# Patient Record
Sex: Male | Born: 1940 | Race: White | Hispanic: No | Marital: Married | Smoking: Former smoker
Health system: Southern US, Community
[De-identification: ages and names within clinical notes are randomized; demographics above are authoritative.]

## PROBLEM LIST (undated history)

## (undated) DIAGNOSIS — E119 Type 2 diabetes mellitus without complications: Secondary | ICD-10-CM

## (undated) DIAGNOSIS — I10 Essential (primary) hypertension: Secondary | ICD-10-CM

## (undated) DIAGNOSIS — M109 Gout, unspecified: Secondary | ICD-10-CM

## (undated) DIAGNOSIS — M0230 Reiter's disease, unspecified site: Secondary | ICD-10-CM

## (undated) DIAGNOSIS — I2510 Atherosclerotic heart disease of native coronary artery without angina pectoris: Secondary | ICD-10-CM

## (undated) DIAGNOSIS — D126 Benign neoplasm of colon, unspecified: Secondary | ICD-10-CM

## (undated) DIAGNOSIS — N183 Chronic kidney disease, stage 3 unspecified: Secondary | ICD-10-CM

## (undated) DIAGNOSIS — E785 Hyperlipidemia, unspecified: Secondary | ICD-10-CM

## (undated) DIAGNOSIS — R972 Elevated prostate specific antigen [PSA]: Secondary | ICD-10-CM

## (undated) DIAGNOSIS — K219 Gastro-esophageal reflux disease without esophagitis: Secondary | ICD-10-CM

## (undated) DIAGNOSIS — Z86010 Personal history of colonic polyps: Secondary | ICD-10-CM

## (undated) DIAGNOSIS — E559 Vitamin D deficiency, unspecified: Secondary | ICD-10-CM

## (undated) DIAGNOSIS — C61 Malignant neoplasm of prostate: Secondary | ICD-10-CM

## (undated) DIAGNOSIS — G459 Transient cerebral ischemic attack, unspecified: Secondary | ICD-10-CM

## (undated) DIAGNOSIS — N210 Calculus in bladder: Secondary | ICD-10-CM

## (undated) DIAGNOSIS — N529 Male erectile dysfunction, unspecified: Secondary | ICD-10-CM

## (undated) DIAGNOSIS — Z87442 Personal history of urinary calculi: Secondary | ICD-10-CM

## (undated) DIAGNOSIS — R0600 Dyspnea, unspecified: Secondary | ICD-10-CM

## (undated) DIAGNOSIS — N200 Calculus of kidney: Secondary | ICD-10-CM

## (undated) HISTORY — DX: Gastro-esophageal reflux disease without esophagitis: K21.9

## (undated) HISTORY — DX: Essential (primary) hypertension: I10

## (undated) HISTORY — DX: Malignant neoplasm of prostate: C61

## (undated) HISTORY — PX: ELBOW SURGERY: SHX618

## (undated) HISTORY — DX: Gout, unspecified: M10.9

## (undated) HISTORY — PX: VASECTOMY: SHX75

## (undated) HISTORY — DX: Hyperlipidemia, unspecified: E78.5

## (undated) HISTORY — PX: COLONOSCOPY: SHX174

## (undated) HISTORY — PX: OTHER SURGICAL HISTORY: SHX169

## (undated) HISTORY — PX: CATARACT EXTRACTION: SUR2

## (undated) HISTORY — PX: PROSTATECTOMY: SHX69

## (undated) HISTORY — DX: Reiter's disease, unspecified site: M02.30

## (undated) HISTORY — DX: Calculus of kidney: N20.0

## (undated) HISTORY — DX: Transient cerebral ischemic attack, unspecified: G45.9

## (undated) HISTORY — DX: Personal history of colonic polyps: Z86.010

## (undated) HISTORY — DX: Benign neoplasm of colon, unspecified: D12.6

## (undated) HISTORY — DX: Type 2 diabetes mellitus without complications: E11.9

## (undated) HISTORY — DX: Vitamin D deficiency, unspecified: E55.9

## (undated) HISTORY — DX: Male erectile dysfunction, unspecified: N52.9

## (undated) HISTORY — PX: PROSTATE BIOPSY: SHX241

---

## 2000-04-21 ENCOUNTER — Emergency Department (HOSPITAL_COMMUNITY): Admission: EM | Admit: 2000-04-21 | Discharge: 2000-04-21 | Payer: Self-pay | Admitting: Emergency Medicine

## 2000-04-21 ENCOUNTER — Encounter: Payer: Self-pay | Admitting: Emergency Medicine

## 2004-01-13 ENCOUNTER — Ambulatory Visit: Payer: Self-pay | Admitting: Internal Medicine

## 2004-04-12 ENCOUNTER — Ambulatory Visit: Payer: Self-pay | Admitting: Internal Medicine

## 2004-04-16 ENCOUNTER — Ambulatory Visit: Payer: Self-pay | Admitting: Internal Medicine

## 2004-07-12 ENCOUNTER — Ambulatory Visit: Payer: Self-pay | Admitting: Internal Medicine

## 2004-11-02 ENCOUNTER — Ambulatory Visit: Payer: Self-pay | Admitting: Internal Medicine

## 2005-01-24 DIAGNOSIS — C61 Malignant neoplasm of prostate: Secondary | ICD-10-CM

## 2005-01-24 HISTORY — PX: PROSTATECTOMY: SHX69

## 2005-01-24 HISTORY — DX: Malignant neoplasm of prostate: C61

## 2005-05-10 ENCOUNTER — Ambulatory Visit: Payer: Self-pay | Admitting: Internal Medicine

## 2005-06-27 ENCOUNTER — Ambulatory Visit: Payer: Self-pay | Admitting: Internal Medicine

## 2005-07-12 ENCOUNTER — Ambulatory Visit: Payer: Self-pay | Admitting: Internal Medicine

## 2005-07-12 ENCOUNTER — Inpatient Hospital Stay (HOSPITAL_COMMUNITY): Admission: EM | Admit: 2005-07-12 | Discharge: 2005-07-12 | Payer: Self-pay | Admitting: Emergency Medicine

## 2005-08-18 ENCOUNTER — Ambulatory Visit: Payer: Self-pay | Admitting: Internal Medicine

## 2005-08-22 ENCOUNTER — Inpatient Hospital Stay (HOSPITAL_COMMUNITY): Admission: RE | Admit: 2005-08-22 | Discharge: 2005-08-23 | Payer: Self-pay | Admitting: Urology

## 2005-08-22 ENCOUNTER — Encounter (INDEPENDENT_AMBULATORY_CARE_PROVIDER_SITE_OTHER): Payer: Self-pay | Admitting: *Deleted

## 2005-08-31 ENCOUNTER — Emergency Department (HOSPITAL_COMMUNITY): Admission: EM | Admit: 2005-08-31 | Discharge: 2005-08-31 | Payer: Self-pay | Admitting: Emergency Medicine

## 2005-09-19 ENCOUNTER — Ambulatory Visit: Payer: Self-pay | Admitting: Internal Medicine

## 2005-09-24 ENCOUNTER — Emergency Department (HOSPITAL_COMMUNITY): Admission: EM | Admit: 2005-09-24 | Discharge: 2005-09-24 | Payer: Self-pay | Admitting: Emergency Medicine

## 2005-12-05 ENCOUNTER — Ambulatory Visit (HOSPITAL_BASED_OUTPATIENT_CLINIC_OR_DEPARTMENT_OTHER): Admission: RE | Admit: 2005-12-05 | Discharge: 2005-12-05 | Payer: Self-pay | Admitting: Urology

## 2005-12-26 ENCOUNTER — Ambulatory Visit: Payer: Self-pay | Admitting: Internal Medicine

## 2005-12-26 LAB — CONVERTED CEMR LAB: Hgb A1c MFr Bld: 8.2 % — ABNORMAL HIGH (ref 4.6–6.0)

## 2006-04-18 ENCOUNTER — Ambulatory Visit: Payer: Self-pay | Admitting: Internal Medicine

## 2006-04-18 LAB — CONVERTED CEMR LAB: Hgb A1c MFr Bld: 6.2 % — ABNORMAL HIGH (ref 4.6–6.0)

## 2006-08-22 ENCOUNTER — Encounter: Payer: Self-pay | Admitting: Internal Medicine

## 2007-01-12 ENCOUNTER — Telehealth: Payer: Self-pay | Admitting: Internal Medicine

## 2007-01-16 ENCOUNTER — Encounter: Payer: Self-pay | Admitting: Internal Medicine

## 2007-02-20 ENCOUNTER — Encounter: Payer: Self-pay | Admitting: Internal Medicine

## 2007-07-19 ENCOUNTER — Ambulatory Visit: Payer: Self-pay | Admitting: Internal Medicine

## 2007-07-19 DIAGNOSIS — E119 Type 2 diabetes mellitus without complications: Secondary | ICD-10-CM

## 2007-07-19 DIAGNOSIS — M109 Gout, unspecified: Secondary | ICD-10-CM

## 2007-07-19 DIAGNOSIS — I10 Essential (primary) hypertension: Secondary | ICD-10-CM

## 2007-07-19 DIAGNOSIS — E1122 Type 2 diabetes mellitus with diabetic chronic kidney disease: Secondary | ICD-10-CM | POA: Insufficient documentation

## 2007-07-19 DIAGNOSIS — N183 Chronic kidney disease, stage 3 (moderate): Secondary | ICD-10-CM

## 2007-07-19 HISTORY — DX: Essential (primary) hypertension: I10

## 2007-07-19 HISTORY — DX: Gout, unspecified: M10.9

## 2007-07-19 HISTORY — DX: Type 2 diabetes mellitus without complications: E11.9

## 2007-07-19 LAB — CONVERTED CEMR LAB: Hgb A1c MFr Bld: 6.7 % — ABNORMAL HIGH (ref 4.6–6.0)

## 2007-07-23 ENCOUNTER — Encounter: Payer: Self-pay | Admitting: Internal Medicine

## 2007-08-21 ENCOUNTER — Encounter: Payer: Self-pay | Admitting: Internal Medicine

## 2007-11-20 ENCOUNTER — Encounter: Payer: Self-pay | Admitting: Internal Medicine

## 2007-11-21 ENCOUNTER — Ambulatory Visit: Payer: Self-pay | Admitting: Internal Medicine

## 2007-11-21 LAB — CONVERTED CEMR LAB
ALT: 44 units/L (ref 0–53)
AST: 32 units/L (ref 0–37)
Albumin: 4.1 g/dL (ref 3.5–5.2)
Alkaline Phosphatase: 72 units/L (ref 39–117)
BUN: 21 mg/dL (ref 6–23)
Basophils Absolute: 0 10*3/uL (ref 0.0–0.1)
Basophils Relative: 0.3 % (ref 0.0–3.0)
Bilirubin, Direct: 0.1 mg/dL (ref 0.0–0.3)
CO2: 32 meq/L (ref 19–32)
Calcium: 9.5 mg/dL (ref 8.4–10.5)
Chloride: 104 meq/L (ref 96–112)
Cholesterol: 192 mg/dL (ref 0–200)
Creatinine, Ser: 1.5 mg/dL (ref 0.4–1.5)
Direct LDL: 88.3 mg/dL
Eosinophils Absolute: 0.4 10*3/uL (ref 0.0–0.7)
Eosinophils Relative: 4.8 % (ref 0.0–5.0)
GFR calc Af Amer: 60 mL/min
GFR calc non Af Amer: 50 mL/min
Glucose, Bld: 117 mg/dL — ABNORMAL HIGH (ref 70–99)
HCT: 38.7 % — ABNORMAL LOW (ref 39.0–52.0)
HDL: 24.8 mg/dL — ABNORMAL LOW (ref >39.0)
Hemoglobin: 13.7 g/dL (ref 13.0–17.0)
Hgb A1c MFr Bld: 6.9 % — ABNORMAL HIGH (ref 4.6–6.0)
Lymphocytes Relative: 20.4 % (ref 12.0–46.0)
MCHC: 35.4 g/dL (ref 30.0–36.0)
MCV: 98.5 fL (ref 78.0–100.0)
Monocytes Absolute: 0.3 10*3/uL (ref 0.1–1.0)
Monocytes Relative: 4.6 % (ref 3.0–12.0)
Neutro Abs: 5.2 10*3/uL (ref 1.4–7.7)
Neutrophils Relative %: 69.9 % (ref 43.0–77.0)
Platelets: 217 10*3/uL (ref 150–400)
Potassium: 4.6 meq/L (ref 3.5–5.1)
RBC: 3.93 M/uL — ABNORMAL LOW (ref 4.22–5.81)
RDW: 13.6 % (ref 11.5–14.6)
Sodium: 143 meq/L (ref 135–145)
TSH: 2.71 microintl units/mL (ref 0.35–5.50)
Total Bilirubin: 0.7 mg/dL (ref 0.3–1.2)
Total CHOL/HDL Ratio: 7.7
Total Protein: 7.4 g/dL (ref 6.0–8.3)
Triglycerides: 377 mg/dL (ref 0–149)
VLDL: 75 mg/dL — ABNORMAL HIGH (ref 0–40)
WBC: 7.4 10*3/uL (ref 4.5–10.5)

## 2007-11-28 ENCOUNTER — Ambulatory Visit: Payer: Self-pay | Admitting: Gastroenterology

## 2007-12-12 ENCOUNTER — Ambulatory Visit: Payer: Self-pay | Admitting: Gastroenterology

## 2007-12-12 ENCOUNTER — Encounter: Payer: Self-pay | Admitting: Internal Medicine

## 2007-12-12 ENCOUNTER — Encounter: Payer: Self-pay | Admitting: Gastroenterology

## 2007-12-14 ENCOUNTER — Encounter: Payer: Self-pay | Admitting: Gastroenterology

## 2008-03-21 ENCOUNTER — Ambulatory Visit: Payer: Self-pay | Admitting: Internal Medicine

## 2008-03-21 LAB — CONVERTED CEMR LAB: Hgb A1c MFr Bld: 7.1 % — ABNORMAL HIGH (ref 4.6–6.0)

## 2008-04-18 ENCOUNTER — Encounter: Payer: Self-pay | Admitting: Internal Medicine

## 2008-07-31 ENCOUNTER — Ambulatory Visit: Payer: Self-pay | Admitting: Internal Medicine

## 2008-07-31 LAB — CONVERTED CEMR LAB
Blood Glucose, Fingerstick: 148
Hgb A1c MFr Bld: 6.6 % — ABNORMAL HIGH (ref 4.6–6.5)

## 2008-08-20 ENCOUNTER — Encounter: Payer: Self-pay | Admitting: Internal Medicine

## 2008-11-25 ENCOUNTER — Ambulatory Visit: Payer: Self-pay | Admitting: Internal Medicine

## 2008-11-25 LAB — CONVERTED CEMR LAB
ALT: 47 units/L (ref 0–53)
Basophils Relative: 0.5 % (ref 0.0–3.0)
Chloride: 103 meq/L (ref 96–112)
Eosinophils Relative: 4.6 % (ref 0.0–5.0)
HCT: 38.9 % — ABNORMAL LOW (ref 39.0–52.0)
Hemoglobin: 13.6 g/dL (ref 13.0–17.0)
Lymphs Abs: 1.4 10*3/uL (ref 0.7–4.0)
MCV: 103 fL — ABNORMAL HIGH (ref 78.0–100.0)
Monocytes Absolute: 0.4 10*3/uL (ref 0.1–1.0)
PSA: 0.01 ng/mL — ABNORMAL LOW (ref 0.10–4.00)
Potassium: 4.7 meq/L (ref 3.5–5.1)
RBC: 3.78 M/uL — ABNORMAL LOW (ref 4.22–5.81)
Sodium: 144 meq/L (ref 135–145)
TSH: 3 microintl units/mL (ref 0.35–5.50)
Total CHOL/HDL Ratio: 7
Total Protein: 7.5 g/dL (ref 6.0–8.3)
VLDL: 62.2 mg/dL — ABNORMAL HIGH (ref 0.0–40.0)
WBC: 7.5 10*3/uL (ref 4.5–10.5)

## 2008-12-02 ENCOUNTER — Ambulatory Visit: Payer: Self-pay | Admitting: Internal Medicine

## 2008-12-02 ENCOUNTER — Encounter (INDEPENDENT_AMBULATORY_CARE_PROVIDER_SITE_OTHER): Payer: Self-pay | Admitting: *Deleted

## 2008-12-02 DIAGNOSIS — E785 Hyperlipidemia, unspecified: Secondary | ICD-10-CM

## 2008-12-02 DIAGNOSIS — Z860100 Personal history of colon polyps, unspecified: Secondary | ICD-10-CM

## 2008-12-02 HISTORY — DX: Personal history of colon polyps, unspecified: Z86.0100

## 2008-12-02 HISTORY — DX: Hyperlipidemia, unspecified: E78.5

## 2008-12-02 HISTORY — DX: Personal history of colonic polyps: Z86.010

## 2008-12-30 ENCOUNTER — Encounter (INDEPENDENT_AMBULATORY_CARE_PROVIDER_SITE_OTHER): Payer: Self-pay | Admitting: *Deleted

## 2009-02-10 ENCOUNTER — Encounter: Payer: Self-pay | Admitting: Internal Medicine

## 2009-03-03 ENCOUNTER — Ambulatory Visit: Payer: Self-pay | Admitting: Internal Medicine

## 2009-03-03 LAB — CONVERTED CEMR LAB: Hgb A1c MFr Bld: 7.4 % — ABNORMAL HIGH (ref 4.6–6.5)

## 2009-06-02 ENCOUNTER — Ambulatory Visit: Payer: Self-pay | Admitting: Internal Medicine

## 2009-06-02 LAB — CONVERTED CEMR LAB: Hgb A1c MFr Bld: 6.9 % — ABNORMAL HIGH (ref 4.6–6.5)

## 2009-08-12 ENCOUNTER — Encounter: Payer: Self-pay | Admitting: Internal Medicine

## 2009-08-28 ENCOUNTER — Ambulatory Visit: Payer: Self-pay | Admitting: Internal Medicine

## 2009-08-28 LAB — CONVERTED CEMR LAB
Blood Glucose, Fingerstick: 90
Hgb A1c MFr Bld: 7.2 % — ABNORMAL HIGH (ref 4.6–6.5)

## 2009-11-27 ENCOUNTER — Ambulatory Visit: Payer: Self-pay | Admitting: Internal Medicine

## 2009-11-27 LAB — CONVERTED CEMR LAB: Hgb A1c MFr Bld: 7 % — ABNORMAL HIGH (ref 4.6–6.5)

## 2009-11-30 ENCOUNTER — Telehealth: Payer: Self-pay | Admitting: Internal Medicine

## 2010-02-20 ENCOUNTER — Encounter: Payer: Self-pay | Admitting: Internal Medicine

## 2010-02-25 NOTE — Assessment & Plan Note (Signed)
Summary: 3 month rov/njr/WIFE RESCD PER PT//CCM    Vital Signs:  Patient profile:   70 year old male Weight:      217 pounds Temp:     97.2 degrees F oral BP sitting:   120 / 80  (right arm) Cuff size:   regular  Vitals Entered By: Raechel Ache, RN (March 03, 2009 3:26 PM) CC: ROV , no c/o Is Patient Diabetic? Yes CBG Result 288   CC:  ROV  and no c/o.  History of Present Illness: 70 year old patient seen today for follow-up of type 2 diabetes, hypertension, and dyslipidemia.  he feels well.  His last hemoglobin A1c7.0.  Her random blood sugar this afternoon  288 after a visit to [facility] fast food.  Has history of hypertension, which has been well-controlled.  He has dyslipidemia, controlled on Niaspan.  His history gout, which has been quiescent, and he also has a history of colonic polyps.  He denies any cardiopulmonary complaints  Allergies: 1)  ! Ace Inhibitors  Past History:  Past Medical History: Reviewed history from 12/02/2008 and no changes required. 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 status post nerve sparing R LRP for prostate cancer, July 2007 Vasectomy surgery for bone spur, right arm  flexible sigmoidoscopy 2003 colonoscopy November 2009  Review of Systems  The patient denies anorexia, fever, weight loss, weight gain, vision loss, decreased hearing, hoarseness, chest pain, syncope, dyspnea on exertion, peripheral edema, prolonged cough, headaches, hemoptysis, abdominal pain, melena, hematochezia, severe indigestion/heartburn, hematuria, incontinence, genital sores, muscle weakness, suspicious skin lesions, transient blindness, difficulty walking, depression, unusual weight change, abnormal bleeding, enlarged lymph nodes,  angioedema, breast masses, and testicular masses.    Physical Exam  General:  Well-developed,well-nourished,in no acute distress; alert,appropriate and cooperative throughout examination; 120/80 Head:  Normocephalic and atraumatic without obvious abnormalities. No apparent alopecia or balding. Eyes:  No corneal or conjunctival inflammation noted. EOMI. Perrla. Funduscopic exam benign, without hemorrhages, exudates or papilledema. Vision grossly normal. Ears:  External ear exam shows no significant lesions or deformities.  Otoscopic examination reveals clear canals, tympanic membranes are intact bilaterally without bulging, retraction, inflammation or discharge. Hearing is grossly normal bilaterally. Mouth:  Oral mucosa and oropharynx without lesions or exudates.  Teeth in good repair. Neck:  No deformities, masses, or tenderness noted. Lungs:  Normal respiratory effort, chest expands symmetrically. Lungs are clear to auscultation, no crackles or wheezes. Heart:  Normal rate and regular rhythm. S1 and S2 normal without gallop, murmur, click, rub or other extra sounds. Abdomen:  Bowel sounds positive,abdomen soft and non-tender without masses, organomegaly or hernias noted. Msk:  No deformity or scoliosis noted of thoracic or lumbar spine.   Pulses:  R and L carotid,radial,femoral,dorsalis pedis and posterior tibial pulses are full and equal bilaterally  Diabetes Management Exam:    Foot Exam (with socks and/or shoes not present):       Sensory-Pinprick/Light touch:          Left medial foot (L-4): normal          Left dorsal foot (L-5): normal          Left lateral foot (S-1): normal          Right medial foot (L-4): normal          Right dorsal foot (L-5): normal  Right lateral foot (S-1): normal       Sensory-Monofilament:          Right foot: normal       Inspection:          Left foot: normal          Right foot: normal       Nails:          Left foot: fungal infection           Right foot: fungal infection    Foot Exam by Podiatrist:       Date: 03/03/2009       Results: no diabetic findings       Done by: PCP   Impression & Recommendations:  Problem # 1:  HYPERLIPIDEMIA (ICD-272.4)  His updated medication list for this problem includes:    Niaspan 1000 Mg Cr-tabs (Niacin (antihyperlipidemic)) ..... One at bedtime  His updated medication list for this problem includes:    Niaspan 1000 Mg Cr-tabs (Niacin (antihyperlipidemic)) ..... One at bedtime  Problem # 2:  HYPERTENSION (ICD-401.9)  His updated medication list for this problem includes:    Benicar Hct 40-25 Mg Tabs (Olmesartan medoxomil-hctz) .Marland Kitchen... 1 once daily    His updated medication list for this problem includes:    Benicar Hct 40-25 Mg Tabs (Olmesartan medoxomil-hctz) .Marland Kitchen... 1 once daily  Problem # 3:  DIABETES MELLITUS, TYPE II (ICD-250.00)  His updated medication list for this problem includes:    Glimepiride 2 Mg Tabs (Glimepiride) .Marland Kitchen... 1 by mouth once daily    Metformin Hcl 1000 Mg Tabs (Metformin hcl) .Marland Kitchen... 1 two times a day    Benicar Hct 40-25 Mg Tabs (Olmesartan medoxomil-hctz) .Marland Kitchen... 1 once daily  Orders: Capillary Blood Glucose/CBG (60454)  His updated medication list for this problem includes:    Glimepiride 2 Mg Tabs (Glimepiride) .Marland Kitchen... 1 by mouth once daily    Metformin Hcl 1000 Mg Tabs (Metformin hcl) .Marland Kitchen... 1 two times a day    Benicar Hct 40-25 Mg Tabs (Olmesartan medoxomil-hctz) .Marland Kitchen... 1 once daily  Complete Medication List: 1)  Glimepiride 2 Mg Tabs (Glimepiride) .Marland Kitchen.. 1 by mouth once daily 2)  Allopurinol 300 Mg Tabs (Allopurinol) .Marland Kitchen.. 1 once daily 3)  Metformin Hcl 1000 Mg Tabs (Metformin hcl) .Marland Kitchen.. 1 two times a day 4)  Benicar Hct 40-25 Mg Tabs (Olmesartan medoxomil-hctz) .Marland Kitchen.. 1 once daily 5)  Niaspan 1000 Mg Cr-tabs (Niacin (antihyperlipidemic)) .... One at bedtime  Other Orders: Prescription Created Electronically [phone]) Venipuncture (91478) TLB-A1C / Hgb  A1C (Glycohemoglobin) (83036-A1C)  Patient Instructions: 1)  Please schedule a follow-up appointment in 3 months. 2)  Limit your Sodium (Salt). 3)  It is important that you exercise regularly at least 20 minutes 5 times a week. If you develop chest pain, have severe difficulty breathing, or feel very tired , stop exercising immediately and seek medical attention. 4)  You need to lose weight. Consider a lower calorie diet and regular exercise.  5)  Check your blood sugars regularly. If your readings are usually above : or below 70 you should contact our office. 6)  It is important that your Diabetic A1c level is checked every 3 months. 7)  See your eye doctor yearly to check for diabetic eye damage. Prescriptions: BENICAR HCT 40-25 MG  TABS (OLMESARTAN MEDOXOMIL-HCTZ) 1 once daily Brand medically necessary #90 x 6   Entered and Authorized by:   Gordy Savers  MD   Signed by:  Gordy Savers  MD on 03/03/2009   Method used:   Print then Give to Patient   RxID:   [id number] METFORMIN HCL 1000 MG  TABS (METFORMIN HCL) 1 two times a day  #200 Tablet x 6   Entered and Authorized by:   Gordy Savers  MD   Signed by:   Gordy Savers  MD on 03/03/2009   Method used:   Print then Give to Patient   RxID:   [id number] ALLOPURINOL 300 MG  TABS (ALLOPURINOL) 1 once daily  #100 Tablet x 6   Entered and Authorized by:   Gordy Savers  MD   Signed by:   Gordy Savers  MD on 03/03/2009   Method used:   Print then Give to Patient   RxID:   [id number] GLIMEPIRIDE 2 MG  TABS (GLIMEPIRIDE) 1 by mouth once daily  #90 x 6   Entered and Authorized by:   Gordy Savers  MD   Signed by:   Gordy Savers  MD on 03/03/2009   Method used:   Print then Give to Patient   RxID:   [id number] BENICAR HCT 40-25 MG  TABS (OLMESARTAN MEDOXOMIL-HCTZ) 1 once daily Brand medically necessary #90 x 6   Entered and Authorized by:   Gordy Savers  MD    Signed by:   Gordy Savers  MD on 03/03/2009   Method used:   [facility]        [facility] [address] [phone]* (retail)       [address]       [place], [place]  [address]       Ph: [phone] or [phone]       Fax: [phone]   RxID:   [id number] METFORMIN HCL 1000 MG  TABS (METFORMIN HCL) 1 two times a day  #200 Tablet x 6   Entered and Authorized by:   Gordy Savers  MD   Signed by:   Gordy Savers  MD on 03/03/2009   Method used:   [facility]        [facility] [address] [phone]* (retail)       [address]       [place], [place]  [address]       Ph: [phone] or [phone]       Fax: [phone]   RxID:   [id number] ALLOPURINOL 300 MG  TABS (ALLOPURINOL) 1 once daily  #100 Tablet x 6   Entered and Authorized by:   Gordy Savers  MD   Signed by:   Gordy Savers  MD on 03/03/2009   Method used:   [facility] to        [facility] [address] [phone]* (retail)       [address]       [place], [place]  [address]       Ph: [phone] or [phone]       Fax: [phone]   RxID:   [id number] GLIMEPIRIDE 2 MG  TABS (GLIMEPIRIDE) 1 by mouth once daily  #90 x 6   Entered and Authorized by:   Gordy Savers  MD   Signed by:   Gordy Savers  MD on 03/03/2009   Method used:   [facility] to  [facility] [address] [phone]* (retail)       [address]       [place], [place]  [address]       Ph: [phone] or [phone]       Fax: [phone]   RxID:   [id number]

## 2010-02-25 NOTE — Assessment & Plan Note (Signed)
Summary: 2 month rov/njr   Vital Signs:  Patient profile:   70 year old male Weight:      213 pounds Temp:     98.2 degrees F oral BP sitting:   130 / 80  (right arm) Cuff size:   regular  Vitals Entered By: Duard Brady LPN (Jun 02, 2009 12:56 PM) CC: 2 mos rov - doing well Is Patient Diabetic? Yes CBG Result 117   CC:  2 mos rov - doing well.  History of Present Illness: 70 -year-old white male, who is in today for follow-up of his type 2 diabetes.  His last him go.  The A1c was 7.3.  His weight is down a few pounds.  He has an on metformin 5 mg b.i.d. and has been intolerant of higher dosing due to GI side effects. He has hypertension, which has been treated with Benicar hydrochlorothiazide.  He describes occasional moderate severe episodes of orthostatic dizziness.  There's been no frank syncope. He has dyslipidemia, treated with Niaspan, which she continues to tolerate. History gout, which has been stable.  He has  been treated with allopurinol.  Preventive Screening-Counseling & Management  Alcohol-Tobacco     Smoking Status: quit  Allergies: 1)  ! Ace Inhibitors  Past History:  Past Medical History: Reviewed history from 12/02/2008 and no changes required. 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  The patient denies anorexia, fever, weight gain, vision loss, decreased hearing, hoarseness, chest pain, syncope, dyspnea on exertion, peripheral edema, prolonged cough, headaches, hemoptysis, abdominal pain, melena, hematochezia, severe indigestion/heartburn, hematuria, incontinence, genital sores, muscle weakness, suspicious skin lesions, transient blindness, difficulty walking, depression, unusual weight change, abnormal bleeding, enlarged lymph nodes,  angioedema, breast masses, and testicular masses.    Physical Exam  General:  overweight-appearing.  130/80overweight-appearing.   Head:  Normocephalic and atraumatic without obvious abnormalities. No apparent alopecia or balding. Eyes:  No corneal or conjunctival inflammation noted. EOMI. Perrla. Funduscopic exam benign, without hemorrhages, exudates or papilledema. Vision grossly normal. Mouth:  Oral mucosa and oropharynx without lesions or exudates.  Teeth in good repair. Neck:  No deformities, masses, or tenderness noted. Lungs:  Normal respiratory effort, chest expands symmetrically. Lungs are clear to auscultation, no crackles or wheezes. Heart:  Normal rate and regular rhythm. S1 and S2 normal without gallop, murmur, click, rub or other extra sounds. Abdomen:  Bowel sounds positive,abdomen soft and non-tender without masses, organomegaly or hernias noted. Msk:  No deformity or scoliosis noted of thoracic or lumbar spine.   Pulses:  R and L carotid,radial,femoral,dorsalis pedis and posterior tibial pulses are full and equal bilaterally Extremities:  No clubbing, cyanosis, edema, or deformity noted with normal full range of motion of all joints.     Impression & Recommendations:  Problem # 1:  HYPERLIPIDEMIA (ICD-272.4)  His updated medication list for this problem includes:    Niaspan 1000 Mg Cr-tabs (Niacin (antihyperlipidemic)) ..... One at bedtime  His updated medication list for this problem includes:    Niaspan 1000 Mg Cr-tabs (Niacin (antihyperlipidemic)) ..... One at bedtime  Problem # 2:  HYPERTENSION (ICD-401.9)  The following medications were removed from the medication list:    Benicar Hct 40-25 Mg Tabs (Olmesartan medoxomil-hctz) .Marland Kitchen... 1 once daily His updated medication list for this problem includes:    Benicar  Hct 40-12.5 Mg Tabs (Olmesartan medoxomil-hctz) ..... One daily  The following medications were removed from the medication list:    Benicar Hct 40-25 Mg  Tabs (Olmesartan medoxomil-hctz) .Marland Kitchen... 1 once daily His updated medication list for this problem includes:    Benicar Hct 40-12.5 Mg Tabs (Olmesartan medoxomil-hctz) ..... One daily  Problem # 3:  DIABETES MELLITUS, TYPE II (ICD-250.00)  The following medications were removed from the medication list:    Benicar Hct 40-25 Mg Tabs (Olmesartan medoxomil-hctz) .Marland Kitchen... 1 once daily His updated medication list for this problem includes:    Glimepiride 2 Mg Tabs (Glimepiride) .Marland Kitchen... 1 by mouth once daily    Metformin Hcl 1000 Mg Tabs (Metformin hcl) .Marland Kitchen... 1/2  two times a day    Benicar Hct 40-12.5 Mg Tabs (Olmesartan medoxomil-hctz) ..... One daily  Orders: Capillary Blood Glucose/CBG (56213) Venipuncture (08657) TLB-A1C / Hgb A1C (Glycohemoglobin) (83036-A1C)  The following medications were removed from the medication list:    Benicar Hct 40-25 Mg Tabs (Olmesartan medoxomil-hctz) .Marland Kitchen... 1 once daily His updated medication list for this problem includes:    Glimepiride 2 Mg Tabs (Glimepiride) .Marland Kitchen... 1 by mouth once daily    Metformin Hcl 1000 Mg Tabs (Metformin hcl) .Marland Kitchen... 1/2  two times a day    Benicar Hct 40-12.5 Mg Tabs (Olmesartan medoxomil-hctz) ..... One daily  Complete Medication List: 1)  Glimepiride 2 Mg Tabs (Glimepiride) .Marland Kitchen.. 1 by mouth once daily 2)  Allopurinol 300 Mg Tabs (Allopurinol) .Marland Kitchen.. 1 once daily 3)  Metformin Hcl 1000 Mg Tabs (Metformin hcl) .... 1/2  two times a day 4)  Niaspan 1000 Mg Cr-tabs (Niacin (antihyperlipidemic)) .... One at bedtime 5)  Benicar Hct 40-12.5 Mg Tabs (Olmesartan medoxomil-hctz) .... One daily  Patient Instructions: 1)  Please schedule a follow-up appointment in 3 months. 2)  Limit your Sodium (Salt). 3)  It is important that you exercise regularly at least 20 minutes 5 times a week. If you develop chest pain, have severe difficulty breathing, or feel very tired , stop exercising immediately and seek medical attention. 4)  You need to lose  weight. Consider a lower calorie diet and regular exercise.  5)  Check your blood sugars regularly. If your readings are usually above : or below 70 you should contact our office. 6)  It is important that your Diabetic A1c level is checked every 3 months. 7)  See your eye doctor yearly to check for diabetic eye damage. Prescriptions: BENICAR HCT 40-12.5 MG TABS (OLMESARTAN MEDOXOMIL-HCTZ) one daily  #90 x 6   Entered and Authorized by:   Gordy Savers  MD   Signed by:   Gordy Savers  MD on 06/02/2009   Method used:   [facility]        [facility] [address] [phone]* (retail)       [address]       [place], [place]  [address]       Ph: [phone] or [phone]       Fax: [phone]   RxID:   [id number]

## 2010-02-25 NOTE — Progress Notes (Signed)
Summary: Pt req script for Accu Check strips.[facility]s [facility]  Phone Note Call from Patient Call back at [phone] Jane's cell   Caller: spouse - Erskine Squibb Summary of Call: Pt has question re: Accu-Check strip. Pt is needing a scrip for Accucheck strips, in order for insurance to cover. Pt is completely out. [facility]s [facility] in to [facility]   Initial call taken by: Lucy Antigua,  November 30, 2009 8:53 AM    Prescriptions: ACCU-CHEK AVIVA  STRP (GLUCOSE BLOOD) once daily and prn  #100 x 6   Entered by:   Duard Brady LPN   Authorized by:   Gordy Savers  MD   Signed by:   Duard Brady LPN on 08/65/7846   Method used:   F[facility]ed to ...       [facility] [address] [phone]* (retail)       [address]       [place], [place]  [address]       Ph: [phone] or [phone]       Fax: [phone]   RxID:   [id number]  [facility]

## 2010-02-25 NOTE — Letter (Signed)
Summary: [facility]  [facility]   Imported By: Maryln Gottron 08/18/2009 10:41:36  _____________________________________________________________________  External Attachment:    Type:   Image     Comment:   External Document

## 2010-02-25 NOTE — Letter (Signed)
Summary: [facility]  [facility]   Imported By: Lanelle Bal 02/17/2009 12:32:40  _____________________________________________________________________  External Attachment:    Type:   Image     Comment:   External Document

## 2010-02-25 NOTE — Assessment & Plan Note (Signed)
Summary: 3 month rov/njr   Vital Signs:  Patient profile:   70 year old male Weight:      214 pounds Temp:     98.0 degrees F oral BP sitting:   120 / 76  (right arm) Cuff size:   regular  Vitals Entered By: Duard Brady LPN (November 27, 2009 11:36 AM) CC: 3 mos rov - doing well     ***declines flu vaccine Is Patient Diabetic? Yes Did you bring your meter with you today? No CBG Result 108   CC:  3 mos rov - doing well     ***declines flu vaccine.  History of Present Illness: 70 year old patient who is seen today for follow-up of his type 2 diabetes as well as hypertension.  He has a history of gout, which has been stable.  He has maintained nice glycemic control.  No concerns or complaints.  His weight has been stable.  Allergies: 1)  ! Ace Inhibitors  Past History:  Past Medical History: Reviewed history from 12/02/2008 and no changes required. 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    Physical Exam  General:  overweight-appearing.  normal blood pressure Head:  Normocephalic and atraumatic without obvious abnormalities. No apparent alopecia or balding. Eyes:  No corneal or conjunctival inflammation noted. EOMI. Perrla. Funduscopic exam benign, without hemorrhages, exudates or papilledema.  Vision grossly normal. Mouth:  Oral mucosa and oropharynx without lesions or exudates.  Teeth in good repair. Neck:  No deformities, masses, or tenderness noted. Lungs:  Normal respiratory effort, chest expands symmetrically. Lungs are clear to auscultation, no crackles or wheezes. Heart:  Normal rate and regular rhythm. S1 and S2 normal without gallop, murmur, click, rub or other extra sounds.   Impression & Recommendations:  Problem # 1:  HYPERTENSION (ICD-401.9)  His updated medication list for this problem includes:    Benicar Hct 40-12.5 Mg Tabs (Olmesartan medoxomil-hctz) ..... One daily  Problem # 2:  DIABETES MELLITUS, TYPE II (ICD-250.00)  His updated medication list for this problem includes:    Glimepiride 2 Mg Tabs (Glimepiride) .Marland Kitchen... 1 by mouth once daily    Metformin Hcl 1000 Mg Tabs (Metformin hcl) .Marland Kitchen... 1/2  two times a day    Benicar Hct 40-12.5 Mg Tabs (Olmesartan medoxomil-hctz) ..... One daily  Orders: Capillary Blood Glucose/CBG (16109) Venipuncture (60454) TLB-A1C / Hgb A1C (Glycohemoglobin) (83036-A1C) Specimen Handling (09811)  Complete Medication List: 1)  Glimepiride 2 Mg Tabs (Glimepiride) .Marland Kitchen.. 1 by mouth once daily 2)  Allopurinol 300 Mg Tabs (Allopurinol) .Marland Kitchen.. 1 once daily 3)  Metformin Hcl 1000 Mg Tabs (Metformin hcl) .... 1/2  two times a day 4)  Niaspan 1000 Mg Cr-tabs (Niacin (antihyperlipidemic)) .... One at bedtime 5)  Benicar Hct 40-12.5 Mg Tabs (Olmesartan medoxomil-hctz) .... One daily 6)  Accu-chek Aviva Kit (Blood glucose monitoring suppl) 7)  Accu-chek Aviva Strp (Glucose blood) .... Once daily and prn 8)  Accu-chek Multiclix Lancets Misc (Lancets) .... Once daily and prn  Patient Instructions: 1)  Please schedule  a follow-up appointment in 3 months. 2)  Limit your Sodium (Salt). 3)  It is important that you exercise regularly at least 20 minutes 5 times a week. If you develop chest pain, have severe difficulty breathing, or feel very  tired , stop exercising immediately and seek medical attention. 4)  Check your blood sugars regularly. If your readings are usually above : or below 70 you should contact our office. 5)  It is important that your Diabetic A1c level is checked every 3 months.   Orders Added: 1)  Capillary Blood Glucose/CBG [82948] 2)  Est. Patient Level III [36644] 3)  Venipuncture [36415] 4)  TLB-A1C / Hgb A1C (Glycohemoglobin) [83036-A1C] 5)  Specimen Handling [99000]

## 2010-02-25 NOTE — Assessment & Plan Note (Signed)
Summary: 3 MONTH ROV/NJR  and aand and  Vital Signs:  Patient profile:   70 year old male Weight:      214 pounds Temp:     97.7 degrees F oral BP sitting:   122 / 82  (right arm) Cuff size:   regular n a row, andCC: 3 mos rov - doing well   requesting new meter Is Patient Diabetic? Yes Did you bring your meter with you today? No CBG Result 90   CC:  3 mos rov - doing well   requesting new meter.  History of Present Illness: 70 year old patient who is seen today for follow-up of his type 2 diabetes.  He has a history of hypertension and dyslipidemia, and gout.  His gout has been stable.  He is maintaining a very nice glycemic control.  He is on Niaspan for his dyslipidemia.  Denies any hypoglycemic symptoms.  Weight is unchanged  Allergies: 1)  ! Ace Inhibitors  Past History:  Past Medical History: Reviewed history from 12/02/2008 and no changes required. 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    Physical Exam  General:  Well-developed,well-nourished,in no acute distress; alert,appropriate and cooperative throughout examination; 120/76 Head:  Normocephalic and atraumatic without obvious abnormalities. No apparent alopecia or balding. Mouth:  Oral mucosa and oropharynx without lesions or exudates.   Teeth in good repair. Neck:  No deformities, masses, or tenderness noted. Lungs:  Normal respiratory effort, chest expands symmetrically. Lungs are clear to auscultation, no crackles or wheezes. Heart:  Normal rate and regular rhythm. S1 and S2 normal without gallop, murmur, click, rub or other extra sounds. Abdomen:  Bowel sounds positive,abdomen soft and non-tender without masses, organomegaly or hernias noted.   Impression & Recommendations:  Problem # 1:  HYPERLIPIDEMIA (ICD-272.4)  His updated medication list for this problem includes:    Niaspan 1000 Mg Cr-tabs (Niacin (antihyperlipidemic)) ..... One at bedtime  His updated medication list for this problem includes:    Niaspan 1000 Mg Cr-tabs (Niacin (antihyperlipidemic)) ..... One at bedtime  Problem # 2:  HYPERTENSION (ICD-401.9)  His updated medication list for this problem includes:    Benicar Hct 40-12.5 Mg Tabs (Olmesartan medoxomil-hctz) ..... One daily  His updated medication list for this problem includes:    Benicar Hct 40-12.5 Mg Tabs (Olmesartan medoxomil-hctz) ..... One daily  Problem # 3:  DIABETES MELLITUS, TYPE II (ICD-250.00)  His updated medication list for this problem includes:    Glimepiride 2 Mg Tabs (Glimepiride) .Marland Kitchen... 1 by mouth once daily    Metformin Hcl 1000 Mg Tabs (Metformin hcl) .Marland Kitchen... 1/2  two times a day    Benicar Hct 40-12.5 Mg Tabs (Olmesartan medoxomil-hctz) ..... One daily  Orders: Capillary Blood Glucose/CBG (19147) Venipuncture (82956) TLB-A1C / Hgb A1C (Glycohemoglobin) (83036-A1C) Specimen Handling (21308)  His updated medication list for this problem includes:    Glimepiride 2 Mg Tabs (Glimepiride) .Marland Kitchen... 1 by mouth once daily    Metformin Hcl 1000 Mg Tabs (Metformin hcl) .Marland Kitchen... 1/2  two times  a day    Benicar Hct 40-12.5 Mg Tabs (Olmesartan medoxomil-hctz) ..... One daily  Complete Medication List: 1)  Glimepiride 2 Mg Tabs (Glimepiride) .Marland Kitchen.. 1 by mouth once daily 2)   Allopurinol 300 Mg Tabs (Allopurinol) .Marland Kitchen.. 1 once daily 3)  Metformin Hcl 1000 Mg Tabs (Metformin hcl) .... 1/2  two times a day 4)  Niaspan 1000 Mg Cr-tabs (Niacin (antihyperlipidemic)) .... One at bedtime 5)  Benicar Hct 40-12.5 Mg Tabs (Olmesartan medoxomil-hctz) .... One daily 6)  Accu-chek Aviva Kit (Blood glucose monitoring suppl) 7)  Accu-chek Aviva Strp (Glucose blood) .... Once daily and prn 8)  Accu-chek Multiclix Lancets Misc (Lancets) .... Once daily and prn  Patient Instructions: 1)  Please schedule a follow-up appointment in 3 months. 2)  Limit your Sodium (Salt). 3)  It is important that you exercise regularly at least 20 minutes 5 times a week. If you develop chest pain, have severe difficulty breathing, or feel very tired , stop exercising immediately and seek medical attention. 4)  You need to lose weight. Consider a lower calorie diet and regular exercise.  5)  Check your blood sugars regularly. If your readings are usually above : or below 70 you should contact our office. 6)  It is important that your Diabetic A1c level is checked every 3 months. 7)  See your eye doctor yearly to check for diabetic eye damage. Prescriptions: BENICAR HCT 40-12.5 MG TABS (OLMESARTAN MEDOXOMIL-HCTZ) one daily  #90 x 6   Entered and Authorized by:   Gordy Savers  MD   Signed by:   Gordy Savers  MD on 08/28/2009   Method used:   [facility] to        [facility] [address] [phone]* (retail)       [address]       [place], [place]  [address]       Ph: [phone] or [phone]       Fax: [phone]   RxID:   [id number] METFORMIN HCL 1000 MG  TABS (METFORMIN HCL) 1/2  two times a day  #90 x 6   Entered and Authorized by:   Gordy Savers  MD   Signed by:   Gordy Savers  MD on 08/28/2009   Method used:   [facility]        [facility] [address] [phone]* (retail)       [address]       [place], [place]  [address]       Ph: [phone] or [phone]       Fax: [phone]   RxID:   [id number] ALLOPURINOL 300 MG  TABS (ALLOPURINOL) 1 once daily  #100 Tablet x 6   Entered and Authorized by:   Gordy Savers  MD   Signed by:   Gordy Savers  MD on 08/28/2009   Method used:   [facility] to        [facility] [address] [phone]* (retail)       [address]       [place], [place]  [address]       Ph: [phone] or [phone]       Fax: [phone]   RxID:   [id number] GLIMEPIRIDE 2 MG  TABS (GLIMEPIRIDE) 1 by mouth once daily  #90 x 6  Entered and Authorized by:   Gordy Savers  MD   Signed by:   Gordy Savers  MD on 08/28/2009   Method used:   Electronically to        [facility] [address] [phone]* (retail)       [address]       [place], [place]  [address]       Ph: [phone] or [phone]       Fax: [phone]   RxID:   [id number] ACCU-CHEK MULTICLIX LANCETS  MISC (LANCETS) once daily and prn  #100 x 6   Entered by:   Duard Brady LPN   Authorized by:   Gordy Savers  MD   Signed by:   Gordy Savers  MD on 08/28/2009   Method used:   Electronically to        [facility] [address] [phone]* (retail)       [address]       [place], [place]  [address]       Ph: [phone] or [phone]       Fax: [phone]   RxID:   [id number] ACCU-CHEK AVIVA  STRP (GLUCOSE BLOOD) once daily and prn  #100 x 6   Entered by:   Duard Brady LPN   Authorized by:   Gordy Savers  MD   Signed by:   Gordy Savers  MD on 08/28/2009   Method used:   Electronically to        [facility] [address] [phone]* (retail)       [address]       [place], [place]  [address]       Ph: [phone] or [phone]       Fax: [phone]   RxID:   [id number]

## 2010-02-26 ENCOUNTER — Ambulatory Visit: Admit: 2010-02-26 | Payer: Self-pay | Admitting: Internal Medicine

## 2010-02-26 ENCOUNTER — Encounter: Payer: Self-pay | Admitting: Internal Medicine

## 2010-02-26 ENCOUNTER — Ambulatory Visit (INDEPENDENT_AMBULATORY_CARE_PROVIDER_SITE_OTHER): Payer: Medicare Other | Admitting: Internal Medicine

## 2010-02-26 VITALS — BP 122/80 | Temp 97.7°F | Ht 68.0 in | Wt 216.0 lb

## 2010-02-26 DIAGNOSIS — E119 Type 2 diabetes mellitus without complications: Secondary | ICD-10-CM

## 2010-02-26 DIAGNOSIS — E785 Hyperlipidemia, unspecified: Secondary | ICD-10-CM

## 2010-02-26 DIAGNOSIS — I10 Essential (primary) hypertension: Secondary | ICD-10-CM

## 2010-02-26 LAB — GLUCOSE, POCT (MANUAL RESULT ENTRY): POC Glucose: 79

## 2010-02-26 LAB — HEMOGLOBIN A1C: Hgb A1c MFr Bld: 7.5 % — ABNORMAL HIGH (ref 4.6–6.5)

## 2010-02-26 NOTE — Progress Notes (Signed)
  Subjective:    Patient ID: Rodney [name], male    DOB: Jun 27, 1940, 70 y.o.   MRN: [id number]  HPI  Seen-year-old patient who is seen today for follow-up of his type 2 diabetes.  He has maintained nice glycemic control on oral therapy.  His hemoglobin A1c's have ranged from 6.62 7.2.  Denies any hypoglycemic symptoms. He has a history of treated hypertension well controlled with Benicar diuretic therapy.  He has an allergy to ACE inhibition.  Blood pressure today well controlled He has a history of dyslipidemia presently on niacin. Stable medical proms include history of gout and colonic polyps. No new concerns or complaints today.    Review of Systems  Constitutional: Negative for fever, chills, appetite change and fatigue.  HENT: Negative for hearing loss, ear pain, congestion, sore throat, trouble swallowing, neck stiffness, dental problem, voice change and tinnitus.   Eyes: Negative for pain, discharge and visual disturbance.  Respiratory: Negative for cough, chest tightness, wheezing and stridor.   Cardiovascular: Negative for chest pain, palpitations and leg swelling.  Gastrointestinal: Negative for nausea, vomiting, abdominal pain, diarrhea, constipation, blood in stool and abdominal distention.  Genitourinary: Negative for urgency, hematuria, flank pain, discharge, difficulty urinating and genital sores.  Musculoskeletal: Negative for myalgias, back pain, joint swelling, arthralgias and gait problem.  Skin: Negative for rash.  Neurological: Negative for dizziness, syncope, speech difficulty, weakness, numbness and headaches.  Hematological: Negative for adenopathy. Does not bruise/bleed easily.  Psychiatric/Behavioral: Negative for behavioral problems and dysphoric mood. The patient is not nervous/anxious.        Objective:   Physical Exam  Constitutional: He is oriented to person, place, and time. He appears well-developed.  HENT:  Head: Normocephalic.  Right Ear:  External ear normal.  Left Ear: External ear normal.  Eyes: Conjunctivae and EOM are normal.  Neck: Normal range of motion.  Cardiovascular: Normal rate and normal heart sounds.   Pulmonary/Chest: Breath sounds normal.  Abdominal: Bowel sounds are normal.  Musculoskeletal: Normal range of motion. He exhibits no edema and no tenderness.  Neurological: He is alert and oriented to person, place, and time.  Psychiatric: He has a normal mood and affect. His behavior is normal.          Assessment & Plan:

## 2010-02-26 NOTE — Assessment & Plan Note (Signed)
Blood pressure well controlled today.  Will continue on his present regimen.  Samples dispensed

## 2010-02-26 NOTE — Patient Instructions (Signed)
Limit your sodium (Salt) intake    It is important that you exercise regularly, at least 20 minutes 3 to 4 times per week.  If you develop chest pain or shortness of breath seek  medical attention.   Please check your hemoglobin A1c every 3 months  Please check your blood pressure on a regular basis.  If it is consistently greater than 150/90, please make an office appointment.  

## 2010-02-26 NOTE — Assessment & Plan Note (Signed)
Remains on niacin therapy, which he continues to tolerate.  Will follow-up on lipid panel at the time of his next annual exam

## 2010-02-26 NOTE — Assessment & Plan Note (Signed)
Remain stable.  Last hemoglobin A1c7.1.  Will recheck today.  Exercise modest weight loss.  All encouraged

## 2010-03-01 ENCOUNTER — Other Ambulatory Visit: Payer: Self-pay | Admitting: Internal Medicine

## 2010-03-02 NOTE — Telephone Encounter (Signed)
This must be filled by his ophthalmologist

## 2010-03-02 NOTE — Telephone Encounter (Signed)
This medicine must be filled by his ophthalmologist

## 2010-03-02 NOTE — Telephone Encounter (Signed)
Sent back to pharm - denied - needs to be rx'd by eye doctor. KIK

## 2010-03-03 ENCOUNTER — Other Ambulatory Visit: Payer: Self-pay | Admitting: Internal Medicine

## 2010-05-14 ENCOUNTER — Other Ambulatory Visit: Payer: Self-pay | Admitting: Internal Medicine

## 2010-05-27 ENCOUNTER — Ambulatory Visit (INDEPENDENT_AMBULATORY_CARE_PROVIDER_SITE_OTHER): Payer: Medicare Other | Admitting: Internal Medicine

## 2010-05-27 ENCOUNTER — Encounter: Payer: Self-pay | Admitting: Internal Medicine

## 2010-05-27 DIAGNOSIS — M109 Gout, unspecified: Secondary | ICD-10-CM

## 2010-05-27 DIAGNOSIS — E119 Type 2 diabetes mellitus without complications: Secondary | ICD-10-CM

## 2010-05-27 DIAGNOSIS — I10 Essential (primary) hypertension: Secondary | ICD-10-CM

## 2010-05-27 DIAGNOSIS — E785 Hyperlipidemia, unspecified: Secondary | ICD-10-CM

## 2010-05-27 LAB — HEMOGLOBIN A1C: Hgb A1c MFr Bld: 7.1 % — ABNORMAL HIGH (ref 4.6–6.5)

## 2010-05-27 MED ORDER — OLMESARTAN MEDOXOMIL-HCTZ 20-12.5 MG PO TABS: 1.0000 | ORAL_TABLET | Freq: Every day | ORAL | Status: DC

## 2010-05-27 NOTE — Patient Instructions (Signed)
Limit your sodium (Salt) intake   Please check your hemoglobin A1c every 3 months    It is important that you exercise regularly, at least 20 minutes 3 to 4 times per week.  If you develop chest pain or shortness of breath seek  medical attention.  You need to lose weight.  Consider a lower calorie diet and regular exercise. 

## 2010-05-27 NOTE — Progress Notes (Signed)
  Subjective:    Patient ID: Rodney [name], male    DOB: [birth year], 70 y.o.   MRN: [id number]  HPI  70 year old patient who is seen today for followup of his type 2 diabetes;  over the past 6 months his hemoglobin A1c has increased from 7.0-7.5.  He feels well today. There's been no significant weight loss Wt Readings from Last 3 Encounters:  05/27/10 213 lb (96.616 kg)  02/26/10 216 lb (97.977 kg)  11/27/09 214 lb (97.07 kg)   He has a history of gout which has been stable. He has a history of dyslipidemia which has been controlled on Niaspan 1000 mg daily. He has treated hypertension which has been controlled on Benicar HCT 40/12.5. He complains of significant orthostatic dizziness and also dizziness with bending and stooping. Blood pressure on arrival 100/70    Review of Systems  Constitutional: Negative for fever, chills, appetite change and fatigue.       Blood pressure 120/78. No orthostatic changes  HENT: Negative for hearing loss, ear pain, congestion, sore throat, trouble swallowing, neck stiffness, dental problem, voice change and tinnitus.   Eyes: Negative for pain, discharge and visual disturbance.  Respiratory: Negative for cough, chest tightness, wheezing and stridor.   Cardiovascular: Negative for chest pain, palpitations and leg swelling.  Gastrointestinal: Negative for nausea, vomiting, abdominal pain, diarrhea, constipation, blood in stool and abdominal distention.  Genitourinary: Negative for urgency, hematuria, flank pain, discharge, difficulty urinating and genital sores.  Musculoskeletal: Negative for myalgias, back pain, joint swelling, arthralgias and gait problem.  Skin: Negative for rash.  Neurological: Negative for dizziness, syncope, speech difficulty, weakness, numbness and headaches.  Hematological: Negative for adenopathy. Does not bruise/bleed easily.  Psychiatric/Behavioral: Negative for behavioral problems and dysphoric mood. The patient is not  nervous/anxious.        Objective:   Physical Exam  Constitutional: He is oriented to person, place, and time. He appears well-developed.       Blood pressure 120/78. No orthostatic changes  HENT:  Head: Normocephalic.  Right Ear: External ear normal.  Left Ear: External ear normal.  Eyes: Conjunctivae and EOM are normal.  Neck: Normal range of motion.  Cardiovascular: Normal rate and normal heart sounds.   Pulmonary/Chest: Breath sounds normal.  Abdominal: Bowel sounds are normal.  Musculoskeletal: Normal range of motion. He exhibits no edema and no tenderness.  Neurological: He is alert and oriented to person, place, and time.  Psychiatric: He has a normal mood and affect. His behavior is normal.          Assessment & Plan:   Hypertension. Probably mild orthostasis. We'll decrease his Benicar HCT Type 2 diabetes. Unclear control. We'll recheck a hemoglobin A1c. Lifestyle issues addressed Gout stable Dyslipidemia stable

## 2010-06-11 NOTE — Op Note (Signed)
Rodney [name], Rodney [name] NO.:  [id number]   MEDICAL RECORD NO.:  [id number]          PATIENT TYPE:  INP   LOCATION:  0001                         FACILITY[facility]  [facility]   PHYSICIAN:  Heloise Purpura, MD      DATE OF BIRTH:  May 02, 1940   DATE OF PROCEDURE:  08/22/2005  DATE OF DISCHARGE:                                 OPERATIVE REPORT   PREOPERATIVE DIAGNOSIS:  Clinically localized adenocarcinoma of the  prostate.   POSTOPERATIVE DIAGNOSIS:  Clinically localized adenocarcinoma of the  prostate.   PROCEDURE:  1.  Robotic assisted laparoscopic radical prostatectomy (non-nerve sparing).  2.  Bilateral laparoscopic pelvic lymphadenectomy.   SURGEON:  Dr. Heloise Purpura.   ASSISTANT:  Dr. Cornelious Bryant.   ANESTHESIA:  General.   COMPLICATIONS:  None.   ESTIMATED BLOOD LOSS:  100 mL.   INTRAVENOUS FLUIDS:  500 mL of colloid and 1 liter of crystalloid.   SPECIMEN:  1.  Prostate and seminal vesicles.  2.  Right pelvic lymph nodes.  3.  Left pelvic lymph nodes.   DRAINS:  1.  20-French coude' catheter.  2.  #19 Blake pelvic drain.   INDICATIONS:  Rodney [name] is a 70 year old gentleman with clinical stage  T1C prostate cancer with a PSA of 6.22 and Gleason score 3+4=7. After  discussing management options for clinically localized prostate cancer, he  elected to proceed with the above procedures.  The potential risks and  benefits were discussed with the patient and he consented.   DESCRIPTION OF PROCEDURE:  The patient was taken to the operating room and a  general anesthetic was administered.  He was given preoperative antibiotics,  placed in the dorsal lithotomy position, prepped and draped in the usual  sterile fashion.  Next a Foley catheter was inserted into the bladder.  A  site was then selected 18 cm from the pubic symphysis and just to the left  of the umbilicus for placement of the camera port.  This was placed using a  standard open Hasson technique.   This allowed entry into the peritoneal  cavity under direct vision.  A 12 mm port was then placed and a  pneumoperitoneum was established.  The abdomen was inspected with a zero  degree lens and there was no evidence of any intra-abdominal injuries or  other abnormalities.  The remaining ports were then placed.  Bilateral 8 mm  robotic ports were placed 16 cm from the pubic symphysis and 10 cm lateral  to the camera port.  An additional 8 mm robotic port was placed in the far  left lateral abdominal wall.  A 5 mm port was placed between the camera port  and the right robotic port.  An additional 12 mm port was placed in the far  right lateral abdominal wall for laparoscopic assistance.  All ports were  placed under direct vision and without difficulty.  The surgical cart was  then docked.  With the aid of the cautery scissors, the bladder was  reflected posteriorly allowing entry into the space of Retzius and  identification of the endopelvic fascia and prostate.  The endopelvic fascia  was then incised from the apex back to the base of the prostate bilaterally  and the underlying levator muscle fibers were swept laterally off the  prostate thereby isolating the dorsal venous complex.  The dorsal venous  complex was then stapled and divided with a 45 mm flex ETS stapler.  The  bladder neck was then identified with the aid of Foley catheter  manipulation.  The anterior bladder neck was incised, thereby exposing the  Foley catheter.  The Foley catheter balloon was deflated and the catheter  was brought into the operative field and used to retract the prostate  anteriorly.  This exposed the posterior bladder neck which was then sharply  incised and dissection continued posteriorly until the vasa deferentia and  seminal vesicles were identified.  The vasa deferentia were isolated and  divided.  The seminal vesicles were similarly isolated and lifted  anteriorly.  The space between the  Denonvilliers fascia and the anterior  rectum was bluntly developed thereby isolating the pedicles of the prostate.  The vascular pedicles of prostate were then ligated with Hem-o-Lok clips and  a wide non-nerve sparing procedure was performed bilaterally.  Attention  then turned to the apex of the prostate.  The urethra was sharply divided  allowing the prostate specimen to be disarticulated and placed up into the  abdomen for later removal.  The pelvis was then copiously irrigated and  hemostasis appeared adequate.  With irrigation in the pelvis, air was  injected into the rectal catheter and there was no evidence of a rectal  injury.  Attention then turned to the right pelvic sidewall.  The fibrofatty  tissue between the external iliac vein, confluence of the iliac vessels,  obturator nerve, and Cooper's ligament was dissected free from the pelvic  sidewall with Hem-o-Lok clips used for lymphostasis and hemostasis.  This  specimen was then removed for permanent pathologic analysis.  An identical  procedure was then performed on the contralateral side.  The attention then  turned to the urethral anastomosis.  A double-armed 3-0 Monocryl suture was  used to perform a 360 degree running tension-free anastomosis between the  bladder neck and urethra.  The catheter was then irrigated.  There were no  blood clots in the catheter and the catheter appeared to irrigate  quantitatively.  There did appear to be a very small amount of leakage from  the anastomosis.  However, with the catheter on a slight amount of tension,  the anastomosis appeared watertight.  A #19 Blake drain was then brought  through the left robotic port and appropriately positioned in the pelvis.  It was secured to the skin with a nylon suture.  The surgical cart was then  undocked.  A #0 Vicryl stitch was used to close the 12 mm right lateral port site with the aid of the suture passer device.  The prostate specimen was   placed into the Endopouch retrieval bag and removed via the periumbilical  port site.  This fascial opening was then closed with a running #0 Vicryl  suture.  All port sites were then injected with 0.25% Marcaine and  reapproximated at skin level with staples.  The patient appeared to tolerate  the procedure well without complications.  He was able to be extubated and  transferred to the recovery unit in satisfactory condition.           ______________________________  Heloise Purpura, MD  Electronically Signed     LB/MEDQ  D:  08/22/2005  T:  08/22/2005  Job:  [id number]

## 2010-06-11 NOTE — Discharge Summary (Signed)
NAMEZAFIR, SCHAUER NO.:  [id number]   MEDICAL RECORD NO.:  [id number]          PATIENT TYPE:  INP   LOCATION:  1421                         FACILITY:  [facility]   PHYSICIAN:  Heloise Purpura, MD      DATE OF BIRTH:  1940/10/09   DATE OF ADMISSION:  08/22/2005  DATE OF DISCHARGE:  08/23/2005                                 DISCHARGE SUMMARY   ADMISSION DIAGNOSIS:  Prostate cancer.   DISCHARGE DIAGNOSIS:  Prostate cancer.   PROCEDURES.:  1.  Robotic assisted laparoscopic radical prostatectomy.  2.  Bilateral pelvic lymphadenectomy.   HISTORY AND PHYSICAL:  For full details, please see admission history and  physical.  Briefly, Mr. Powe is a 70 year old gentleman with clinical  stage T1C prostate cancer with a PSA of 6.22 and Gleason score of 3+4=7.  After discussing management options, the patient elected to proceed with  surgical therapy and the above procedures.   HOSPITAL COURSE:  On August 22, 2005, the patient was taken to the operating  room and underwent the above procedures.  He tolerated these procedures well  and without complications.  Postoperatively, he was able to be transferred  to a regular hospital room following recovery from anesthesia.  On the  evening of surgery, he was able to begin ambulating which he did without  difficulty.  By postoperative day #1, he was ambulating without problems.  He was able to begin a clear liquid diet and subsequently transitioned to  oral pain medication.  His blood glucose levels were carefully monitored and  he was maintained on a sliding scale insulin schedule until tolerating a  liquid diet.  He maintained excellent urine output from his Foley catheter  and with minimal output from his pelvic drain.  Therefore his pelvic drain  was able to be removed.  By the afternoon of postoperative day #1, he had  met all discharge criteria and was able to be discharged home in excellent  condition.   DISPOSITION:   Home.   DISCHARGE MEDICATIONS:  Mr. Roosevelt was instructed to resume his regular  home medications.  He was also instructed to monitor his blood glucose  levels until he was back on his usual diet.  He was instructed to refrain  from taking any aspirin, nonsteroidal anti-inflammatory drugs, or herbal  supplements.  He was given a prescription to take Vicodin as needed for  pain, told to take Colace as a stool softener, and given a prescription to  begin Cipro 1 day prior to his return visit for Foley catheter removal.   DISCHARGE INSTRUCTIONS:  Mr. Zeimet was instructed to gradually advance  his diet once passing flatus.  He was encouraged to be ambulatory but  specifically instructed to refrain from any heavy lifting, strenuous  activity, or driving.  He was instructed on the signs and symptoms of wound  infection and told to call should he have any problems.  He was also  instructed on routine Foley catheter care and given a leg bag for daytime  usage.   FOLLOW UP:  Mr. Lipke will follow-up in 1 week for  Foley catheter  removal as well as to and review his surgical pathology.           ______________________________  Heloise Purpura, MD  Electronically Signed     LB/MEDQ  D:  08/23/2005  T:  08/24/2005  Job:  [id number]   cc:   Gordy Savers, M.D. [facility]  [address] [place]  [place] [address]

## 2010-06-11 NOTE — H&P (Signed)
Rodney [name] NO.:  [id number]   MEDICAL RECORD NO.:  [id number]          PATIENT TYPE:  INP   LOCATION:  0001                         FACILITY:  [facility]   PHYSICIAN:  Heloise Purpura, MD      DATE OF BIRTH:  1940/09/21   DATE OF ADMISSION:  08/22/2005  DATE OF DISCHARGE:                                HISTORY & PHYSICAL   CHIEF COMPLAINT:  Prostate cancer.   HISTORY:  [name] is a 70 year old gentleman with clinical stage T1C  prostate cancer with a PSA of 6.22 and Gleason score 3+4=7.  After  discussing management options for clinically localized prostate cancer the  patient elected to proceed with surgical therapy with a robotic assisted  laparoscopic radical prostatectomy.   PAST MEDICAL HISTORY:  1.  Diabetes.  2.  Hypertension.  3.  Glaucoma.  4.  Gout.  5.  Urolithiasis.   PAST SURGICAL HISTORY:  Vasectomy.   CURRENT MEDICATIONS:  1.  Allopurinol.  2.  Metformin.  3.  Timoptic eye drops.  4.  __________ .   ALLERGIES:  No known drug allergies.   FAMILY HISTORY:  No history of GU malignancy or prostate cancer.   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  He is married.  He did smoke two pack of cigarettes per  day for 20 years and quit in 1975.  He drinks alcohol only occasionally.   PHYSICAL EXAMINATION:  CONSTITUTIONAL:  The patient is a well-nourished,  well-developed, age-appropriate male in no acute distress.  CARDIOVASCULAR:  Regular rate and rhythm without obvious murmurs.  LUNGS:  Clear bilaterally.  ABDOMEN:  Protuberant and obese with a small umbilical hernia.  Otherwise,  soft without abdominal masses.  DIGITAL RECTAL EXAMINATION:  The prostate is asymmetrical with an enlarged  right lobe, but no discrete nodularity.   IMPRESSION:  Clinically localized prostate cancer.   PLAN:  [name] has received clearance from Dr. Amador Cunas most  recently.  He did have his  anti-hypertensive regimen adjusted for improved  control of his hypertension.  He will undergo robotic assisted laparoscopic  radical prostatectomy with bilateral pelvic lymphadenectomy and then be  admitted to the hospital for routine postoperative care.           ______________________________  Heloise Purpura, MD  Electronically Signed     LB/MEDQ  D:  08/22/2005  T:  08/22/2005  Job:  [id number]

## 2010-06-11 NOTE — Op Note (Signed)
NAMEDELFIN, SQUILLACE NO.:  [id number]   MEDICAL RECORD NO.:  [id number]          PATIENT TYPE:  AMB   LOCATION:  NESC                         FACILITY:  [facility]   PHYSICIAN:  Heloise Purpura, MD      DATE OF BIRTH:  10/07/1940   DATE OF PROCEDURE:  12/05/2005  DATE OF DISCHARGE:                                 OPERATIVE REPORT   PREOPERATIVE DIAGNOSIS:  1. Prostate cancer.  2. Bladder neck contracture.   POSTOPERATIVE DIAGNOSIS:  1. Prostate cancer.  2. Bladder neck contracture.   PROCEDURE:  1. Cystoscopy.  2. Balloon dilation of bladder neck.   SURGEON:  Crecencio Mc, M.D.   ANESTHESIA:  General.   COMPLICATIONS:  None.   INDICATIONS:  Mr. Miron is a 70 year old gentleman who is status post a  robotic assisted laparoscopic radical prostatectomy.  He subsequently  developed difficulty voiding with a prolonged time of voiding and difficulty  emptying his bladder.  Flexible cystoscopy in the office revealed a bladder  neck contracture.  After discussing the options, the patient elected to  proceed with the above procedure.  Potential risks and benefits were  discussed with the patient; and he consented.   DESCRIPTION OF PROCEDURE:  The patient was taken to the operating room and a  general anesthetic was administered.  He was given preoperative antibiotics,  placed in the dorsal lithotomy position, and prepped and draped in the usual  sterile fashion.  Urethroscopy was then performed with a 22-French  cystoscope sheath.  This confirmed a bladder neck contracture which would  not allow the cystoscope to be passed into the bladder.  A 0.038 sensor  guidewire was able to be advanced into the bladder under direct visual  guidance.  Placement in the bladder was confirmed on fluoroscopy.  The  Ultraxx balloon dilator was then advanced over the wire and positioned under  fluoroscopic guidance.  It was inflated until all waste was seen to be  removed from the  balloon up to a 24-French.  This was left inflated for 2  minutes.  It was then deflated; and the cystoscope was, again, used to  visualize the  bladder neck.  It was now dilated; and the cystoscope could be passed into  the bladder.  The patient's bladder was then emptied with a 16-French Coude  catheter which was left indwelling.  The patient tolerated the procedure  well and without complications.  He was able to be transferred to recovery  unit in satisfactory condition.           ______________________________  Heloise Purpura, MD  Electronically Signed     LB/MEDQ  D:  12/05/2005  T:  12/05/2005  Job:  [id number]

## 2010-06-11 NOTE — Discharge Summary (Signed)
NAMESTRATTON, VILLWOCK NO.:  [id number]   MEDICAL RECORD NO.:  [id number]          PATIENT TYPE:  INP   LOCATION:  4707                         FACILITY:  [facility]   PHYSICIAN:  Gordy Savers, M.D. LHCDATE OF BIRTH:  [birth year]   DATE OF ADMISSION:  07/11/2005  DATE OF DISCHARGE:                                 DISCHARGE SUMMARY   HISTORY OF PRESENT ILLNESS:  The patient is a 70 year old gentleman with a  long history of hypertension.  He has been on benazepril for 3-4 years.  A  couple of weeks ago he noted some brief tongue swelling which was treated  with Benadryl and resolved.  On the day of admission, he noted marked edema  of the tongue.  He is unable to swallow saliva or talk.  He presented to the  emergency room where he was treated with IV Benadryl, dexamethasone and  Pepcid with stabilization.  He was admitted for overnight observation.   LABORATORY DATA AND HOSPITAL COURSE:  During the course of brief admission,  he was treated with percutaneous heparin, Solu-Medrol and maintained on  Benadryl and Pepcid.  The following day he was largely asymptomatic.  He  felt some mild tingling of the tongue but no overt edema.  Chest was clear.   IMPRESSION:  1.  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  2.  Hypertension.  3.  History of prostate cancer.   DISPOSITION:  He will be discharged to complete oral Benadryl and Pepcid AC  for a couple of days and then clinically observed.  He will be discharged on  Benicar 20 mg daily.  Reassessment in the office within the next week.           ______________________________  Gordy Savers, M.D. [facility]     PFK/MEDQ  D:  07/12/2005  T:  07/12/2005  Job:  [id number]

## 2010-06-11 NOTE — H&P (Signed)
Rodney [name], Rodney [name]            ACCOUNT NO.:  [id number]   MEDICAL RECORD NO.:  [id number]          PATIENT TYPE:  EMS   LOCATION:  MAJO                         FACILITY:  [facility]   PHYSICIAN:  Sean A. Everardo All, M.D. [facility] OF BIRTH:  09-13-1940   DATE OF ADMISSION:  07/11/2005  DATE OF DISCHARGE:                                HISTORY & PHYSICAL   PRIMARY CARE PHYSICIAN:  Dr. Gordy Savers, [facility].   CHIEF COMPLAINT:  Tongue swelling.   HISTORY OF PRESENT ILLNESS:  Rodney [name] is a 70 year old gentleman with a  history of hypertension, who has been on benazepril for 3-4 years, who now  presents for tongue swelling.  The patient first noticed tongue swelling  approximately 6-8 weeks ago that responded to p.o. Benadryl.  The second  episode occurred 3-4 weeks ago, also responding to p.o. Benadryl.  However,  starting around 7 p.m. tonight, the patient noted progressive swelling of  his tongue.  He denied any shortness of breath, but noted that he was unable  to swallow any saliva.  He presented to the emergency room, where he  received IV Benadryl, dexamethasone and Pepcid which subsequently stabilized  his swelling.  The patient continues to deny any shortness of breath.  The  patient denies any orthopnea, chest pain or recent changes to his  medications.   PAST MEDICAL HISTORY:  Past medical history is notable for:  1.  Diabetes, type 2.  2.  Glaucoma.  3.  Gout.  4.  Hypertension.  5.  Recently diagnosed prostate cancer for which the patient is to undergo      prostatectomy in July of 2007.   ALLERGIES:  No known drug allergies.   MEDICATIONS:  1.  Benazepril 20 mg once a day.  2.  Metformin 500 mg twice a day.  3.  Allopurinol 300 mg a day.  4.  Timoptic eye drops.   SOCIAL HISTORY:  The patient lives with his wife in [place].  He owns a  Geophysicist/field seismologist.  He denies any history of alcohol, tobacco or  drug use.   FAMILY  HISTORY:  He denies any adverse reactions or any history of coronary  artery disease or diabetes.   REVIEW OF SYSTEMS:  Full review of systems was repeated and is negative.   PHYSICAL EXAMINATION:  VITAL SIGNS:  Blood pressure 176/105, pulse 83,  temperature -- afebrile, respiratory rate is 22.  GENERAL:  The patient is awake, alert, oriented x3, in no acute distress.  HEENT:  Normocephalic, atraumatic with significant tongue swelling.  NECK:  Neck shows no JVD, no lymphadenopathy.  CARDIOVASCULAR:  Regular rhythm, normal rate, no murmurs, rubs, or gallops.  LUNGS:  Clear to auscultation bilaterally.  ABDOMEN:  Positive bowel sounds, soft, nontender and non-distended.  EXTREMITIES:  No cyanosis, clubbing or edema.  NEUROLOGIC:  Cranial nerves II-XII grossly intact.  No focal musculoskeletal  or sensory deficits.   LABORATORY DATA:  Labs are pending.   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   We will monitor patient  on telemetry and pulse oximetry for any worsening  shortness of breath.  We will continue dexamethasone, H2 blocker and  benazepril twice daily and observe him overnight.  Benazepril has obviously  been held and hypertension will be treated with hydralazine for the time-  being.      Reginia Forts, MD  Electronically Signed     ______________________________  Cleophas Dunker Everardo All, M.D. [facility]    RA/MEDQ  D:  07/12/2005  T:  07/12/2005  Job:  [id number]

## 2010-07-21 ENCOUNTER — Telehealth: Payer: Self-pay | Admitting: Internal Medicine

## 2010-07-21 NOTE — Telephone Encounter (Signed)
Was on Benicar Hct 20-12mg  samples. Requesting a new rx to be sent to [facility] [address].

## 2010-07-22 ENCOUNTER — Telehealth: Payer: Self-pay | Admitting: Internal Medicine

## 2010-07-22 MED ORDER — OLMESARTAN MEDOXOMIL-HCTZ 20-12.5 MG PO TABS: 1.0000 | ORAL_TABLET | Freq: Every day | ORAL | Status: DC

## 2010-07-22 MED ORDER — OLMESARTAN MEDOXOMIL-HCTZ 40-12.5 MG PO TABS: 1.0000 | ORAL_TABLET | Freq: Every day | ORAL | Status: DC

## 2010-07-22 NOTE — Telephone Encounter (Signed)
Pharmacy called and needs clarification of Benicar dosage and instructions. Pls call.

## 2010-07-22 NOTE — Telephone Encounter (Signed)
#  [phone]  [id number] 

## 2010-07-22 NOTE — Telephone Encounter (Signed)
Wrong dose [facility] - new corrected dose sent - kent at [facility] aware

## 2010-07-22 NOTE — Telephone Encounter (Signed)
Please advise if new rx is okay to send to [facility]

## 2010-08-27 ENCOUNTER — Encounter: Payer: Self-pay | Admitting: Internal Medicine

## 2010-08-27 ENCOUNTER — Ambulatory Visit (INDEPENDENT_AMBULATORY_CARE_PROVIDER_SITE_OTHER): Payer: Medicare Other | Admitting: Internal Medicine

## 2010-08-27 DIAGNOSIS — I10 Essential (primary) hypertension: Secondary | ICD-10-CM

## 2010-08-27 DIAGNOSIS — E119 Type 2 diabetes mellitus without complications: Secondary | ICD-10-CM

## 2010-08-27 LAB — HEMOGLOBIN A1C: Hgb A1c MFr Bld: 6.7 % — ABNORMAL HIGH (ref 4.6–6.5)

## 2010-08-27 NOTE — Patient Instructions (Signed)
Limit your sodium (Salt) intake    It is important that you exercise regularly, at least 20 minutes 3 to 4 times per week.  If you develop chest pain or shortness of breath seek  medical attention.  You need to lose weight.  Consider a lower calorie diet and regular exercise. 

## 2010-08-27 NOTE — Progress Notes (Signed)
  Subjective:    Patient ID: Rodney [name], male    DOB: 06/22/1940, 70 y.o.   MRN: [id number]  HPI  Wt Readings from Last 3 Encounters:  08/27/10 212 lb (96.163 kg)  05/27/10 213 lb (96.616 kg)  02/26/10 216 lb (97.977 kg)    Review of Systems     Objective:   Physical Exam        Assessment & Plan:

## 2010-08-27 NOTE — Progress Notes (Signed)
  Subjective:    Patient ID: Rodney [name], male    DOB: 12-22-1940, 70 y.o.   MRN: [id number]  HPI  70 year old patient who is seen today for followup of his type 2 diabetes. His last hemoglobin A1c is 7.1 he has maintained reasonable glycemic control. He has treated hypertension which has been stable. He describes rare mild orthostatic dizziness. No cardiopulmonary complaints. He has a history of mild dyslipidemia which has been stable and treated with niacin.   Review of Systems  Constitutional: Negative for fever, chills, appetite change and fatigue.  HENT: Negative for hearing loss, ear pain, congestion, sore throat, trouble swallowing, neck stiffness, dental problem, voice change and tinnitus.   Eyes: Negative for pain, discharge and visual disturbance.  Respiratory: Negative for cough, chest tightness, wheezing and stridor.   Cardiovascular: Negative for chest pain, palpitations and leg swelling.  Gastrointestinal: Negative for nausea, vomiting, abdominal pain, diarrhea, constipation, blood in stool and abdominal distention.  Genitourinary: Negative for urgency, hematuria, flank pain, discharge, difficulty urinating and genital sores.  Musculoskeletal: Negative for myalgias, back pain, joint swelling, arthralgias and gait problem.  Skin: Negative for rash.  Neurological: Negative for dizziness, syncope, speech difficulty, weakness, numbness and headaches.  Hematological: Negative for adenopathy. Does not bruise/bleed easily.  Psychiatric/Behavioral: Negative for behavioral problems and dysphoric mood. The patient is not nervous/anxious.        Objective:   Physical Exam  Constitutional: He is oriented to person, place, and time. He appears well-developed.  HENT:  Head: Normocephalic.  Right Ear: External ear normal.  Left Ear: External ear normal.  Eyes: Conjunctivae and EOM are normal.  Neck: Normal range of motion.  Cardiovascular: Normal rate and normal heart sounds.     Pulmonary/Chest: Breath sounds normal.  Abdominal: Bowel sounds are normal.  Musculoskeletal: Normal range of motion. He exhibits no edema and no tenderness.  Neurological: He is alert and oriented to person, place, and time.  Psychiatric: He has a normal mood and affect. His behavior is normal.          Assessment & Plan:  Diabetes mellitus type 2 stable. We'll check a hemoglobin A1c Hypertension well controlled. We'll continue present regimen   Complete physical in 3 months

## 2010-10-26 ENCOUNTER — Other Ambulatory Visit: Payer: Self-pay | Admitting: Internal Medicine

## 2010-10-26 LAB — GLUCOSE, CAPILLARY
Glucose-Capillary: 151 — ABNORMAL HIGH
Glucose-Capillary: 169 — ABNORMAL HIGH

## 2010-11-26 ENCOUNTER — Other Ambulatory Visit (INDEPENDENT_AMBULATORY_CARE_PROVIDER_SITE_OTHER): Payer: Medicare Other

## 2010-11-26 DIAGNOSIS — Z79899 Other long term (current) drug therapy: Secondary | ICD-10-CM

## 2010-11-26 DIAGNOSIS — Z125 Encounter for screening for malignant neoplasm of prostate: Secondary | ICD-10-CM

## 2010-11-26 DIAGNOSIS — Z0000 Encounter for general adult medical examination without abnormal findings: Secondary | ICD-10-CM

## 2010-11-26 DIAGNOSIS — E119 Type 2 diabetes mellitus without complications: Secondary | ICD-10-CM

## 2010-11-26 DIAGNOSIS — I10 Essential (primary) hypertension: Secondary | ICD-10-CM

## 2010-11-26 LAB — CBC WITH DIFFERENTIAL/PLATELET
Basophils Absolute: 0 10*3/uL (ref 0.0–0.1)
Lymphocytes Relative: 18.3 % (ref 12.0–46.0)
Monocytes Relative: 5.8 % (ref 3.0–12.0)
Neutrophils Relative %: 70.9 % (ref 43.0–77.0)
Platelets: 268 10*3/uL (ref 150.0–400.0)
RDW: 15 % — ABNORMAL HIGH (ref 11.5–14.6)

## 2010-11-26 LAB — BASIC METABOLIC PANEL WITH GFR
BUN: 29 mg/dL — ABNORMAL HIGH (ref 6–23)
CO2: 27 meq/L (ref 19–32)
Calcium: 9.7 mg/dL (ref 8.4–10.5)
Chloride: 103 meq/L (ref 96–112)
Creatinine, Ser: 2 mg/dL — ABNORMAL HIGH (ref 0.4–1.5)
GFR: 34.83 mL/min — ABNORMAL LOW
Glucose, Bld: 122 mg/dL — ABNORMAL HIGH (ref 70–99)
Potassium: 5.4 meq/L — ABNORMAL HIGH (ref 3.5–5.1)
Sodium: 141 meq/L (ref 135–145)

## 2010-11-26 LAB — POCT URINALYSIS DIPSTICK
Blood, UA: NEGATIVE
Glucose, UA: NEGATIVE
Ketones, UA: NEGATIVE
Protein, UA: NEGATIVE
Spec Grav, UA: >=1.03
Urobilinogen, UA: 0.2

## 2010-11-26 LAB — HEPATIC FUNCTION PANEL
AST: 26 U/L (ref 0–37)
Alkaline Phosphatase: 71 U/L (ref 39–117)
Total Bilirubin: 0.7 mg/dL (ref 0.3–1.2)

## 2010-11-26 LAB — LIPID PANEL
Total CHOL/HDL Ratio: 6
VLDL: 50.2 mg/dL — ABNORMAL HIGH (ref 0.0–40.0)

## 2010-11-26 LAB — MICROALBUMIN / CREATININE URINE RATIO
Creatinine,U: 126 mg/dL
Microalb Creat Ratio: 1.3 mg/g (ref 0.0–30.0)

## 2010-11-26 LAB — TSH: TSH: 1.92 u[IU]/mL (ref 0.35–5.50)

## 2010-11-28 ENCOUNTER — Other Ambulatory Visit: Payer: Self-pay | Admitting: Internal Medicine

## 2010-12-03 ENCOUNTER — Ambulatory Visit (INDEPENDENT_AMBULATORY_CARE_PROVIDER_SITE_OTHER): Payer: Medicare Other | Admitting: Internal Medicine

## 2010-12-03 ENCOUNTER — Encounter: Payer: Self-pay | Admitting: Internal Medicine

## 2010-12-03 VITALS — BP 112/70 | HR 80 | Temp 97.9°F | Resp 16 | Ht 68.0 in | Wt 210.0 lb

## 2010-12-03 DIAGNOSIS — I10 Essential (primary) hypertension: Secondary | ICD-10-CM

## 2010-12-03 DIAGNOSIS — E785 Hyperlipidemia, unspecified: Secondary | ICD-10-CM

## 2010-12-03 DIAGNOSIS — Z86010 Personal history of colonic polyps: Secondary | ICD-10-CM

## 2010-12-03 DIAGNOSIS — Z0000 Encounter for general adult medical examination without abnormal findings: Secondary | ICD-10-CM

## 2010-12-03 DIAGNOSIS — M109 Gout, unspecified: Secondary | ICD-10-CM

## 2010-12-03 DIAGNOSIS — Z23 Encounter for immunization: Secondary | ICD-10-CM

## 2010-12-03 DIAGNOSIS — E119 Type 2 diabetes mellitus without complications: Secondary | ICD-10-CM

## 2010-12-03 LAB — HM DIABETES FOOT EXAM

## 2010-12-03 MED ORDER — ALLOPURINOL 300 MG PO TABS: 300.0000 mg | ORAL_TABLET | Freq: Every day | ORAL | Status: DC

## 2010-12-03 MED ORDER — SAXAGLIPTIN HCL 2.5 MG PO TABS: 2.5000 mg | ORAL_TABLET | Freq: Every day | ORAL | Status: DC

## 2010-12-03 MED ORDER — GLIMEPIRIDE 2 MG PO TABS: 2.0000 mg | ORAL_TABLET | Freq: Every day | ORAL | Status: DC

## 2010-12-03 MED ORDER — OLMESARTAN MEDOXOMIL-HCTZ 20-12.5 MG PO TABS: 1.0000 | ORAL_TABLET | Freq: Every day | ORAL | Status: DC

## 2010-12-03 NOTE — Patient Instructions (Signed)
Nephrology consultation is discussed  Limit your sodium (Salt) intake    It is important that you exercise regularly, at least 20 minutes 3 to 4 times per week.  If you develop chest pain or shortness of breath seek  medical attention.    You need to lose weight.  Consider a lower calorie diet and regular exercise.   Please check your hemoglobin A1c every 3 months

## 2010-12-03 NOTE — Progress Notes (Signed)
Subjective:    Patient ID: Rodney [name], male    DOB: 1940/08/15, 70 y.o.   MRN: [id number]  HPI History of Present Illness:   70 year-old patient who is seen today for a comprehensive evaluation. Problems include type 2 diabetes, history of prostate cancer followed closely by urology. He is a history of gout, which has been stable, as well as hypertension. Is followed by ophthalmology for glaucoma. No concerns or complaints today.  He has a long history of chronic stable dyspnea on exertion, but no other cardiopulmonary complaints. He has been seen by urology recently and treated for a UTI. Laboratory studies reviewed and revealed a creatinine that has increased to 2.0. Diabetic medications include metformin. He is followed by ophthalmology for glaucoma at least 3 times annually  1. Risk factors, based on past  M,S,F history. Cardiovascular risk factors include dyslipidemia diabetes hypertension  2.  Physical activities: Remains fairly active;  avid fisherman  3.  Depression/mood: No history of depression or mood disorder  4.  Hearing: No significant deficits  5.  ADL's: Independent in all aspects of daily living 6.  Fall risk: Low 7.  Home safety: No problems identified  8.  Height weight, and visual acuity; height and weight stable no change in visual acuity  9.  Counseling: We'll regular exercise modest weight loss encouraged 10. Lab orders based on risk factors: Laboratory profile including renal function studies and lipid panel discussed hemoglobin A1c 6.6  11. Referral : We'll set up for a nephrology evaluation  12. Care plan: Follow nephrology and urology. Modest weight loss encouraged  13. Cognitive assessment: Alert and oriented with normal affect     Preventive Screening-Counseling & Management  Alcohol-Tobacco  Smoking Status: quit   Allergies:  1) ! Ace Inhibitors  Past History:  Past Medical History:  prostate cancer  Diabetes mellitus, type II  Gout    Hypertension  ED  history of glaucoma  history incomplete Reiter's syndrome  Hyperlipidemia-low HDL cholesterol  Colonic polyps, hx of   Past Surgical History:  Reviewed history from 03/21/2008 and no changes required.  status post nerve sparing R LRP for prostate cancer, July 2007  Vasectomy  surgery for bone spur, right arm  flexible sigmoidoscopy 2003  colonoscopy November 2009   Family History:  Reviewed history from 11/21/2007 and no changes required.  father deceased, health unknown  mother, age 18  One brother  two sisters: one half-sister with metastatic colon cancer  Her and in a and in a knife and lives at night at a Social History:  Reviewed history from 11/21/2007 and no changes required.  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  HENT: Negative for hearing loss, ear pain, congestion, rhinorrhea, sneezing, mouth sores, trouble swallowing, neck pain, neck stiffness, dental problem, voice change, sinus pressure and tinnitus.   Eyes: Negative for photophobia, pain, redness and visual disturbance.  Respiratory: Negative for apnea, cough, choking, chest tightness, shortness of breath and wheezing.   Cardiovascular: Negative for chest pain, palpitations and leg swelling.  Gastrointestinal: Negative for nausea, vomiting, abdominal pain, diarrhea, constipation, blood in stool, abdominal distention, anal bleeding and rectal pain.  Genitourinary: Negative for dysuria, urgency, frequency, hematuria, flank pain, decreased urine volume, discharge, penile swelling, scrotal swelling, difficulty urinating, genital sores and testicular pain.  Musculoskeletal: Negative for myalgias, back pain, joint swelling, arthralgias and gait problem.  Skin: Negative for color change, rash and wound.  Neurological: Negative for dizziness, tremors, seizures, syncope,  facial asymmetry, speech difficulty, weakness, light-headedness, numbness and headaches.  Hematological: Negative for adenopathy. Does not bruise/bleed easily.  Psychiatric/Behavioral: Negative for suicidal ideas, hallucinations, behavioral problems, confusion, sleep disturbance, self-injury, dysphoric mood, decreased concentration and agitation. The patient is not nervous/anxious.        Objective:   Physical Exam  Constitutional: He appears well-developed and well-nourished.  HENT:  Head: Normocephalic and atraumatic.  Right Ear: External ear normal.  Left Ear: External ear normal.  Nose: Nose normal.  Mouth/Throat: Oropharynx is clear and moist.       Upper dentures in place  Eyes: Conjunctivae and EOM are normal. Pupils are equal, round, and reactive to light. No scleral icterus.  Neck: Normal range of motion. Neck supple. No JVD present. No thyromegaly present.  Cardiovascular: Regular rhythm, normal heart sounds and intact distal pulses.  Exam reveals no gallop and no friction rub.   No murmur heard. Pulmonary/Chest: Effort normal and breath sounds normal. He exhibits no tenderness.  Abdominal: Soft. Bowel sounds are normal. He exhibits no distension and no mass. There is no tenderness.  Musculoskeletal: Normal range of motion. He exhibits no edema and no tenderness.  Lymphadenopathy:    He has no cervical adenopathy.  Neurological: He is alert. He has normal reflexes. No cranial nerve deficit. Coordination normal.  Skin: Skin is warm and dry. No rash noted.  Psychiatric: He has a normal mood and affect. His behavior is normal.          Assessment & Plan:    Preventive health examination Diabetes mellitus. Last glycemic control with hemoglobin A1c 6.6 Chronic kidney disease. 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 Creatinine 2.0. We'll set up to see nephrology. No microalbuminuria Glaucoma. Stable per ophthalmology History prostate cancer Hypertension controlled Dyslipidemia

## 2011-03-04 ENCOUNTER — Ambulatory Visit: Payer: Medicare Other | Admitting: Internal Medicine

## 2011-03-07 ENCOUNTER — Encounter: Payer: Self-pay | Admitting: Internal Medicine

## 2011-03-07 ENCOUNTER — Ambulatory Visit (INDEPENDENT_AMBULATORY_CARE_PROVIDER_SITE_OTHER): Payer: Medicare Other | Admitting: Internal Medicine

## 2011-03-07 VITALS — BP 110/78 | HR 82 | Temp 97.8°F | Wt 215.0 lb

## 2011-03-07 DIAGNOSIS — I10 Essential (primary) hypertension: Secondary | ICD-10-CM

## 2011-03-07 DIAGNOSIS — E785 Hyperlipidemia, unspecified: Secondary | ICD-10-CM

## 2011-03-07 DIAGNOSIS — E119 Type 2 diabetes mellitus without complications: Secondary | ICD-10-CM

## 2011-03-07 LAB — HEMOGLOBIN A1C: Hgb A1c MFr Bld: 7.6 % — ABNORMAL HIGH (ref 4.6–6.5)

## 2011-03-07 MED ORDER — GLIMEPIRIDE 2 MG PO TABS: 4.0000 mg | ORAL_TABLET | Freq: Every day | ORAL | Status: DC

## 2011-03-07 NOTE — Progress Notes (Signed)
  Subjective:    Patient ID: Rodney [name], male    DOB: 05-08-1940, 71 y.o.   MRN: [id number]  HPI  71 year old patient who is seen today for followup. He has a history of type 2 diabetes and treated hypertension. He has chronic kidney disease and is now being followed by nephrology. His blood sugars have done quite well. No concerns or complaints today denies any cardiopulmonary complaints he states that he is maintaining a nice glycemic control. His last hemoglobin A1c 6.6.  Wt Readings from Last 3 Encounters:  03/07/11 215 lb (97.523 kg)  12/03/10 210 lb (95.255 kg)  08/27/10 212 lb (96.163 kg)          Review of Systems  Constitutional: Negative for fever, chills, appetite change and fatigue.  HENT: Negative for hearing loss, ear pain, congestion, sore throat, trouble swallowing, neck stiffness, dental problem, voice change and tinnitus.   Eyes: Negative for pain, discharge and visual disturbance.  Respiratory: Negative for cough, chest tightness, wheezing and stridor.   Cardiovascular: Negative for chest pain, palpitations and leg swelling.  Gastrointestinal: Negative for nausea, vomiting, abdominal pain, diarrhea, constipation, blood in stool and abdominal distention.  Genitourinary: Negative for urgency, hematuria, flank pain, discharge, difficulty urinating and genital sores.  Musculoskeletal: Negative for myalgias, back pain, joint swelling, arthralgias and gait problem.  Skin: Negative for rash.  Neurological: Negative for dizziness, syncope, speech difficulty, weakness, numbness and headaches.  Hematological: Negative for adenopathy. Does not bruise/bleed easily.  Psychiatric/Behavioral: Negative for behavioral problems and dysphoric mood. The patient is not nervous/anxious.        Objective:   Physical Exam  Constitutional: He is oriented to person, place, and time. He appears well-developed.  HENT:  Head: Normocephalic.  Right Ear: External ear normal.  Left  Ear: External ear normal.  Eyes: Conjunctivae and EOM are normal.  Neck: Normal range of motion.  Cardiovascular: Normal rate and normal heart sounds.   Pulmonary/Chest: Breath sounds normal.  Abdominal: Bowel sounds are normal.  Musculoskeletal: Normal range of motion. He exhibits no edema and no tenderness.  Neurological: He is alert and oriented to person, place, and time.  Psychiatric: He has a normal mood and affect. His behavior is normal.          Assessment & Plan:   Hypertension:   BP Readings from Last 3 Encounters:  03/07/11 110/78  12/03/10 112/70  08/27/10 140/90    Diabetes Mellitus last hemoglobin A1c of 6.6. We'll continue present regimen Chronic kidney disease followed in nephrology this week as scheduled  We'll continue present regimen  Follow up hemoglobin A1c

## 2011-03-07 NOTE — Patient Instructions (Signed)
Limit your sodium (Salt) intake  You need to lose weight.  Consider a lower calorie diet and regular exercise.    It is important that you exercise regularly, at least 20 minutes 3 to 4 times per week.  If you develop chest pain or shortness of breath seek  medical attention.   

## 2011-03-08 NOTE — Progress Notes (Signed)
Quick Note:  Spoke with pt - informed of lab and need to increase med - on 2mg  - will now take 2 tabs (total4mg ) daily ______

## 2011-03-09 ENCOUNTER — Encounter: Payer: Self-pay | Admitting: Gastroenterology

## 2011-04-20 ENCOUNTER — Other Ambulatory Visit: Payer: Self-pay | Admitting: Internal Medicine

## 2011-06-08 ENCOUNTER — Ambulatory Visit (INDEPENDENT_AMBULATORY_CARE_PROVIDER_SITE_OTHER): Payer: Medicare Other | Admitting: Internal Medicine

## 2011-06-08 ENCOUNTER — Encounter: Payer: Self-pay | Admitting: Internal Medicine

## 2011-06-08 VITALS — BP 120/80 | Temp 97.6°F | Wt 211.0 lb

## 2011-06-08 DIAGNOSIS — E119 Type 2 diabetes mellitus without complications: Secondary | ICD-10-CM

## 2011-06-08 DIAGNOSIS — I10 Essential (primary) hypertension: Secondary | ICD-10-CM

## 2011-06-08 MED ORDER — GLIMEPIRIDE 4 MG PO TABS: 4.0000 mg | ORAL_TABLET | Freq: Every day | ORAL | Status: DC

## 2011-06-08 MED ORDER — OLMESARTAN MEDOXOMIL 20 MG PO TABS: 20.0000 mg | ORAL_TABLET | Freq: Every day | ORAL | Status: DC

## 2011-06-08 NOTE — Progress Notes (Signed)
  Subjective:    Patient ID: Rodney [name], male    DOB: 01-01-1941, 71 y.o.   MRN: [id number]  HPI 71 year old patient who is seen today for followup of his hypertension and type 2 diabetes. He has enjoyed the improved glycemic control with increase in his sulfonylurea dose. No hypoglycemia. His only complaint is dizziness. This occurs when he stands from a sitting position occasionally but is most marked when he is bending and stooping over and then stands.     Review of Systems  Constitutional: Negative for fever, chills, appetite change and fatigue.  HENT: Negative for hearing loss, ear pain, congestion, sore throat, trouble swallowing, neck stiffness, dental problem, voice change and tinnitus.   Eyes: Negative for pain, discharge and visual disturbance.  Respiratory: Negative for cough, chest tightness, wheezing and stridor.   Cardiovascular: Negative for chest pain, palpitations and leg swelling.  Gastrointestinal: Negative for nausea, vomiting, abdominal pain, diarrhea, constipation, blood in stool and abdominal distention.  Genitourinary: Negative for urgency, hematuria, flank pain, discharge, difficulty urinating and genital sores.  Musculoskeletal: Negative for myalgias, back pain, joint swelling, arthralgias and gait problem.  Skin: Negative for rash.  Neurological: Positive for light-headedness. Negative for dizziness, syncope, speech difficulty, weakness, numbness and headaches.  Hematological: Negative for adenopathy. Does not bruise/bleed easily.  Psychiatric/Behavioral: Negative for behavioral problems and dysphoric mood. The patient is not nervous/anxious.        Objective:   Physical Exam  Constitutional: He is oriented to person, place, and time. He appears well-developed.       Blood pressure 120/80 sitting without orthostatic change when standing  HENT:  Head: Normocephalic.  Right Ear: External ear normal.  Left Ear: External ear normal.  Eyes: Conjunctivae  and EOM are normal.  Neck: Normal range of motion.  Cardiovascular: Normal rate and normal heart sounds.   Pulmonary/Chest: Breath sounds normal.  Abdominal: Bowel sounds are normal.  Musculoskeletal: Normal range of motion. He exhibits no edema and no tenderness.  Neurological: He is alert and oriented to person, place, and time.  Psychiatric: He has a normal mood and affect. His behavior is normal.          Assessment & Plan:   Diabetes mellitus type 2. We'll check a hemoglobin A1c Hypertension. Possible orthostatic symptoms. We'll check his blood pressure medication to Benicar 20 and discontinue hydrochlorothiazide  Recheck 3 months

## 2011-06-08 NOTE — Patient Instructions (Signed)
Limit your sodium (Salt) intake    It is important that you exercise regularly, at least 20 minutes 3 to 4 times per week.  If you develop chest pain or shortness of breath seek  medical attention.   Please check your hemoglobin A1c every 3 months   

## 2011-06-14 ENCOUNTER — Telehealth: Payer: Self-pay | Admitting: Internal Medicine

## 2011-06-14 NOTE — Telephone Encounter (Signed)
Pt req lab results. Pls call on pts cell #[phone]. Pts home phone is not working.

## 2011-06-14 NOTE — Telephone Encounter (Signed)
Spoke with pt- informed of lab - better 7.0

## 2011-06-22 ENCOUNTER — Telehealth: Payer: Self-pay | Admitting: Internal Medicine

## 2011-06-22 NOTE — Telephone Encounter (Signed)
Requesting glucometers strips be called in

## 2011-06-22 NOTE — Telephone Encounter (Signed)
Attempt to call- VM - LMTCB to discuss blood sugars

## 2011-06-22 NOTE — Telephone Encounter (Signed)
Pt's wife calling to state she is extremely concerned about patient's glucose.  Before dinner pt was shaking and checked his sugar and it was 56 or 58.  He ate peanut butter and seemed to fell better.  This is has happened continuously.  Please call pt on his cell to discuss.

## 2011-06-23 MED ORDER — GLUCOSE BLOOD VI STRP: 1.0000 | ORAL_STRIP | Freq: Two times a day (BID) | Status: DC

## 2011-06-23 NOTE — Telephone Encounter (Signed)
done

## 2011-06-24 ENCOUNTER — Ambulatory Visit (INDEPENDENT_AMBULATORY_CARE_PROVIDER_SITE_OTHER): Payer: Medicare Other | Admitting: Internal Medicine

## 2011-06-24 ENCOUNTER — Telehealth: Payer: Self-pay

## 2011-06-24 ENCOUNTER — Encounter: Payer: Self-pay | Admitting: Internal Medicine

## 2011-06-24 VITALS — BP 114/70 | Wt 212.0 lb

## 2011-06-24 DIAGNOSIS — I10 Essential (primary) hypertension: Secondary | ICD-10-CM

## 2011-06-24 DIAGNOSIS — E119 Type 2 diabetes mellitus without complications: Secondary | ICD-10-CM

## 2011-06-24 MED ORDER — GLUCOSE BLOOD VI STRP: 1.0000 | ORAL_STRIP | Freq: Two times a day (BID) | Status: DC

## 2011-06-24 MED ORDER — PIOGLITAZONE HCL 45 MG PO TABS: 45.0000 mg | ORAL_TABLET | Freq: Every day | ORAL | Status: DC

## 2011-06-24 NOTE — Progress Notes (Signed)
  Subjective:    Patient ID: Rodney [name], male    DOB: [birth year], 71 y.o.   MRN: [id number]  HPI  71 year old patient who is seen today for followup of his diabetes. 2 weeks ago, his hemoglobin A1c 7.0. Prior to that hemoglobin A1c was 7.6 and Amaryl was up titrated to 4 mg daily. For the past couple weeks he has had episodes of mild hypoglycemia with blood sugars in the 50s and 60s.    Review of Systems  Neurological: Positive for tremors and weakness.       Objective:   Physical Exam  Constitutional: He appears well-developed and well-nourished. No distress.          Assessment & Plan:   Symptomatic hypoglycemia in a patient with chronic kidney disease.  We'll continue onglyza and discontinue sulfonylurea. We'll add Actos 45 mg. Patient is aware that will take several weeks to have the full benefit. If blood sugar remains under poor control by consider low-dose short-acting  Glipizide.  Hypertension stable

## 2011-06-24 NOTE — Telephone Encounter (Signed)
Pt saw Dr Kirtland Bouchard two weeks ago, and takes 2 medications for BS. Pts wife states he feels "weird" and will check his BS hours after eating and his numbers will be in the 50's. Pts wife states they have been trying to get ahold of someone and has had no luck. Patient is out of test strips (I sent into pharmacy), so when he feels "weird" he can't check his BS and just drinks orange juice. Please advise? I also scheduled for patient to come in at 3:45, pts wife will call back if he can't make it. Pts wife would like a callback if he can't make it this afternoon for a plan for this weekend. Pts phone number [phone]

## 2011-06-24 NOTE — Patient Instructions (Signed)
Limit your sodium (Salt) intake    It is important that you exercise regularly, at least 20 minutes 3 to 4 times per week.  If you develop chest pain or shortness of breath seek  medical attention. 

## 2011-06-24 NOTE — Telephone Encounter (Signed)
Spoke with pt - will keep appt this afternoon to address blood sugar issues

## 2011-09-08 ENCOUNTER — Ambulatory Visit: Payer: Medicare Other | Admitting: Internal Medicine

## 2011-09-13 ENCOUNTER — Ambulatory Visit: Payer: Medicare Other | Admitting: Internal Medicine

## 2011-09-21 ENCOUNTER — Encounter: Payer: Self-pay | Admitting: Internal Medicine

## 2011-09-21 ENCOUNTER — Ambulatory Visit (INDEPENDENT_AMBULATORY_CARE_PROVIDER_SITE_OTHER): Payer: Medicare Other | Admitting: Internal Medicine

## 2011-09-21 VITALS — BP 110/68 | Temp 97.6°F | Wt 211.0 lb

## 2011-09-21 DIAGNOSIS — E119 Type 2 diabetes mellitus without complications: Secondary | ICD-10-CM

## 2011-09-21 DIAGNOSIS — I10 Essential (primary) hypertension: Secondary | ICD-10-CM

## 2011-09-21 LAB — HEMOGLOBIN A1C: Hgb A1c MFr Bld: 6.8 % — ABNORMAL HIGH (ref 4.6–6.5)

## 2011-09-21 NOTE — Patient Instructions (Signed)
Please check your hemoglobin A1c every 3 months    It is important that you exercise regularly, at least 20 minutes 3 to 4 times per week.  If you develop chest pain or shortness of breath seek  medical attention.  Limit your sodium (Salt) intake  You need to lose weight.  Consider a lower calorie diet and regular exercise.    

## 2011-09-21 NOTE — Progress Notes (Signed)
Subjective:    Patient ID: Rodney [name], male    DOB: [birth year], 71 y.o.   MRN: [id number]  HPI  71 year old patient who is seen today for followup. He has type 2 diabetes. This has done quite well and his last hemoglobin A1c 7.0. He feels he is maintained nice glycemic control. He is followed by renal medicine closely do to chronic kidney disease. Apparently more recently his creatinine has increased. He has treated hypertension and is maintained on Benicar. He is scheduled for urology followup soon. He does have a history of prostate cancer and is status post prostatectomy. His only complaint is the with sounds like vertigo. This usually occurs in a stooped over position but occasionally occurs when he stands from a sitting position. Does not true vertigo with a spinning sensation  Wt Readings from Last 3 Encounters:  09/21/11 211 lb (95.709 kg)  06/24/11 212 lb (96.163 kg)  06/08/11 211 lb (95.709 kg)    Past Medical History  Diagnosis Date  . DIABETES MELLITUS, TYPE II 07/19/2007  . HYPERLIPIDEMIA 12/02/2008  . GOUT 07/19/2007  . HYPERTENSION 07/19/2007  . COLONIC POLYPS, HX OF 12/02/2008  . Cancer     prostate  . Erectile dysfunction   . Glaucoma   . Reiter syndrome     incomplete    History   Social History  . Marital Status: Married    Spouse Name: N/A    Number of Children: N/A  . Years of Education: N/A   Occupational History  . Not on file.   Social History Main Topics  . Smoking status: Former Smoker    Types: Cigarettes    Quit date: 01/24/1978  . Smokeless tobacco: Never Used  . Alcohol Use: Yes     occasionally -socially  . Drug Use: No  . Sexually Active: Not on file     2 childern   Other Topics Concern  . Not on file   Social History Narrative  . No narrative on file    Past Surgical History  Procedure Date  . Vasectomy   . Elbow surgery     bone spur - right    Family History  Problem Relation Age of Onset  . Cancer Sister    metastatic colon ca    Allergies  Allergen Reactions  . Ace Inhibitors     Current Outpatient Prescriptions on File Prior to Visit  Medication Sig Dispense Refill  . allopurinol (ZYLOPRIM) 300 MG tablet Take 1 tablet (300 mg total) by mouth daily.  90 tablet  4  . Blood Glucose Monitoring Suppl (ACCU-CHEK AVIVA) kit by Other route. Use as instructed       . glucose blood (ACCU-CHEK AVIVA) test strip 1 each by Other route 2 (two) times daily.  100 each  3  . Lancets (ACCU-CHEK MULTICLIX) lancets 1 each by Other route daily. Use as instructed       . niacin (NIASPAN) 1000 MG CR tablet Take 1,000 mg by mouth at bedtime.        Marland Kitchen olmesartan (BENICAR) 20 MG tablet Take 1 tablet (20 mg total) by mouth daily.  90 tablet  4  . pioglitazone (ACTOS) 45 MG tablet Take 1 tablet (45 mg total) by mouth daily.  90 tablet  6  . saxagliptin HCl (ONGLYZA) 2.5 MG TABS tablet Take 1 tablet (2.5 mg total) by mouth daily.  30 tablet  6  . timolol (TIMOPTIC-XR) 0.5 % ophthalmic gel-forming  BP 110/68  Temp 97.6 F (36.4 C) (Oral)  Wt 211 lb (95.709 kg)   Review of Systems  Constitutional: Negative for fever, chills, appetite change and fatigue.  HENT: Negative for hearing loss, ear pain, congestion, sore throat, trouble swallowing, neck stiffness, dental problem, voice change and tinnitus.   Eyes: Negative for pain, discharge and visual disturbance.  Respiratory: Negative for cough, chest tightness, wheezing and stridor.   Cardiovascular: Negative for chest pain, palpitations and leg swelling.  Gastrointestinal: Negative for nausea, vomiting, abdominal pain, diarrhea, constipation, blood in stool and abdominal distention.  Genitourinary: Negative for urgency, hematuria, flank pain, discharge, difficulty urinating and genital sores.  Musculoskeletal: Negative for myalgias, back pain, joint swelling, arthralgias and gait problem.  Skin: Negative for rash.  Neurological: Positive for dizziness.  Negative for syncope, speech difficulty, weakness, numbness and headaches.  Hematological: Negative for adenopathy. Does not bruise/bleed easily.  Psychiatric/Behavioral: Negative for behavioral problems and dysphoric mood. The patient is not nervous/anxious.        Objective:   Physical Exam  Constitutional: He is oriented to person, place, and time. He appears well-developed.       Blood pressure 120/70  HENT:  Head: Normocephalic.  Right Ear: External ear normal.  Left Ear: External ear normal.  Eyes: Conjunctivae and EOM are normal.  Neck: Normal range of motion.  Cardiovascular: Normal rate and normal heart sounds.   Pulmonary/Chest: Breath sounds normal.  Abdominal: Bowel sounds are normal.  Musculoskeletal: Normal range of motion. He exhibits no edema and no tenderness.  Neurological: He is alert and oriented to person, place, and time.  Psychiatric: He has a normal mood and affect. His behavior is normal.          Assessment & Plan:   HTN-well controlled;  Doubt orthostatic hypotension DM2-will check hgha1c CKD-per Urology

## 2011-12-14 ENCOUNTER — Other Ambulatory Visit: Payer: Self-pay | Admitting: Internal Medicine

## 2011-12-14 NOTE — Telephone Encounter (Signed)
Rx refill sent to pharmacy. 

## 2011-12-27 ENCOUNTER — Encounter: Payer: Self-pay | Admitting: Internal Medicine

## 2011-12-27 ENCOUNTER — Ambulatory Visit (INDEPENDENT_AMBULATORY_CARE_PROVIDER_SITE_OTHER): Payer: Medicare Other | Admitting: Internal Medicine

## 2011-12-27 VITALS — BP 150/86 | HR 100 | Temp 98.4°F | Resp 18 | Wt 219.0 lb

## 2011-12-27 DIAGNOSIS — E119 Type 2 diabetes mellitus without complications: Secondary | ICD-10-CM

## 2011-12-27 DIAGNOSIS — N183 Chronic kidney disease, stage 3 unspecified: Secondary | ICD-10-CM

## 2011-12-27 DIAGNOSIS — I10 Essential (primary) hypertension: Secondary | ICD-10-CM

## 2011-12-27 LAB — HEMOGLOBIN A1C: Hgb A1c MFr Bld: 6.5 % (ref 4.6–6.5)

## 2011-12-27 NOTE — Progress Notes (Signed)
Subjective:    Patient ID: Rodney [name], male    DOB: December 22, 1940, 71 y.o.   MRN: [id number]  HPI  71 year old patient who is seen today for followup. He is treated hypertension and remains on Benicar 20. He has a history of intolerance to ACE inhibition. He has type 2 diabetes and chronic kidney disease. His diabetes remains well controlled. His last hemoglobin A1c 6.8. He has gained 8 pounds over the holidays. In general he feels well. He has a history of gout which has been stable. No cardiopulmonary complaints.  Wt Readings from Last 3 Encounters:  12/27/11 219 lb (99.338 kg)  09/21/11 211 lb (95.709 kg)  06/24/11 212 lb (96.163 kg)   Past Medical History  Diagnosis Date  . DIABETES MELLITUS, TYPE II 07/19/2007  . HYPERLIPIDEMIA 12/02/2008  . GOUT 07/19/2007  . HYPERTENSION 07/19/2007  . COLONIC POLYPS, HX OF 12/02/2008  . Cancer     prostate  . Erectile dysfunction   . Glaucoma   . Reiter syndrome     incomplete    History   Social History  . Marital Status: Married    Spouse Name: N/A    Number of Children: N/A  . Years of Education: N/A   Occupational History  . Not on file.   Social History Main Topics  . Smoking status: Former Smoker    Types: Cigarettes    Quit date: 01/24/1978  . Smokeless tobacco: Never Used  . Alcohol Use: Yes     Comment: occasionally -socially  . Drug Use: No  . Sexually Active: Not on file     Comment: 2 childern   Other Topics Concern  . Not on file   Social History Narrative  . No narrative on file    Past Surgical History  Procedure Date  . Vasectomy   . Elbow surgery     bone spur - right    Family History  Problem Relation Age of Onset  . Cancer Sister     metastatic colon ca    Allergies  Allergen Reactions  . Ace Inhibitors     Current Outpatient Prescriptions on File Prior to Visit  Medication Sig Dispense Refill  . allopurinol (ZYLOPRIM) 300 MG tablet Take 1 tablet (300 mg total) by mouth daily.  90  tablet  4  . Blood Glucose Monitoring Suppl (ACCU-CHEK AVIVA) kit by Other route. Use as instructed       . glucose blood (ACCU-CHEK AVIVA) test strip 1 each by Other route 2 (two) times daily.  100 each  3  . Lancets (ACCU-CHEK MULTICLIX) lancets 1 each by Other route daily. Use as instructed       . niacin (NIASPAN) 1000 MG CR tablet Take 1,000 mg by mouth at bedtime.        Marland Kitchen olmesartan (BENICAR) 20 MG tablet Take 1 tablet (20 mg total) by mouth daily.  90 tablet  4  . ONGLYZA 2.5 MG TABS tablet TAKE 1 TABLET BY MOUTH DAILY  30 tablet  4  . pioglitazone (ACTOS) 45 MG tablet Take 1 tablet (45 mg total) by mouth daily.  90 tablet  6  . timolol (TIMOPTIC-XR) 0.5 % ophthalmic gel-forming         BP 150/86  Pulse 100  Temp 98.4 F (36.9 C) (Oral)  Resp 18  Wt 219 lb (99.338 kg)  SpO2 98%     Review of Systems  Constitutional: Negative for fever, chills, appetite change and fatigue.  HENT: Negative for hearing loss, ear pain, congestion, sore throat, trouble swallowing, neck stiffness, dental problem, voice change and tinnitus.   Eyes: Negative for pain, discharge and visual disturbance.  Respiratory: Negative for cough, chest tightness, wheezing and stridor.   Cardiovascular: Negative for chest pain, palpitations and leg swelling.  Gastrointestinal: Negative for nausea, vomiting, abdominal pain, diarrhea, constipation, blood in stool and abdominal distention.  Genitourinary: Negative for urgency, hematuria, flank pain, discharge, difficulty urinating and genital sores.  Musculoskeletal: Negative for myalgias, back pain, joint swelling, arthralgias and gait problem.  Skin: Negative for rash.  Neurological: Negative for dizziness, syncope, speech difficulty, weakness, numbness and headaches.  Hematological: Negative for adenopathy. Does not bruise/bleed easily.  Psychiatric/Behavioral: Negative for behavioral problems and dysphoric mood. The patient is not nervous/anxious.         Objective:   Physical Exam  Constitutional: He is oriented to person, place, and time. He appears well-developed.       Blood pressure 150/82  HENT:  Head: Normocephalic.  Right Ear: External ear normal.  Left Ear: External ear normal.  Eyes: Conjunctivae normal and EOM are normal.  Neck: Normal range of motion.  Cardiovascular: Normal rate and normal heart sounds.   Pulmonary/Chest: Breath sounds normal.  Abdominal: Bowel sounds are normal.  Musculoskeletal: Normal range of motion. He exhibits no edema and no tenderness.  Neurological: He is alert and oriented to person, place, and time.  Psychiatric: He has a normal mood and affect. His behavior is normal.          Assessment & Plan:   Diabetes mellitus. Will check a hemoglobin A1c weight loss encouraged Hypertension. Blood pressure borderline high lifestyle discussed. Will attempt to lose weight. We'll reassess at the time of his physical next visit Chronic kidney disease. Recheck lab in 3 months

## 2011-12-27 NOTE — Patient Instructions (Signed)
Limit your sodium (Salt) intake    It is important that you exercise regularly, at least 20 minutes 3 to 4 times per week.  If you develop chest pain or shortness of breath seek  medical attention.   Please check your hemoglobin A1c every 3 months   

## 2012-02-23 ENCOUNTER — Other Ambulatory Visit: Payer: Self-pay | Admitting: *Deleted

## 2012-02-23 MED ORDER — ALLOPURINOL 300 MG PO TABS: 300.0000 mg | ORAL_TABLET | Freq: Every day | ORAL | Status: DC

## 2012-02-23 NOTE — Telephone Encounter (Signed)
Called pt told him received fax for refill on Allopurinol, need to know what [facility]? Pt stated the one on [address]. Told pt okay will send refill. Pt verbalized understanding. Refill sent.

## 2012-03-26 ENCOUNTER — Encounter: Payer: Medicare Other | Admitting: Internal Medicine

## 2012-03-29 ENCOUNTER — Other Ambulatory Visit: Payer: Self-pay | Admitting: *Deleted

## 2012-03-29 ENCOUNTER — Encounter: Payer: Self-pay | Admitting: Internal Medicine

## 2012-03-29 ENCOUNTER — Ambulatory Visit (INDEPENDENT_AMBULATORY_CARE_PROVIDER_SITE_OTHER): Payer: Medicare Other | Admitting: Internal Medicine

## 2012-03-29 VITALS — BP 130/80 | Temp 97.6°F | Ht 68.25 in | Wt 213.0 lb

## 2012-03-29 DIAGNOSIS — N200 Calculus of kidney: Secondary | ICD-10-CM | POA: Insufficient documentation

## 2012-03-29 DIAGNOSIS — Z0000 Encounter for general adult medical examination without abnormal findings: Secondary | ICD-10-CM

## 2012-03-29 DIAGNOSIS — E119 Type 2 diabetes mellitus without complications: Secondary | ICD-10-CM

## 2012-03-29 DIAGNOSIS — E785 Hyperlipidemia, unspecified: Secondary | ICD-10-CM

## 2012-03-29 DIAGNOSIS — I10 Essential (primary) hypertension: Secondary | ICD-10-CM

## 2012-03-29 DIAGNOSIS — C61 Malignant neoplasm of prostate: Secondary | ICD-10-CM

## 2012-03-29 MED ORDER — ALLOPURINOL 300 MG PO TABS: 300.0000 mg | ORAL_TABLET | Freq: Every day | ORAL | Status: DC

## 2012-03-29 MED ORDER — PIOGLITAZONE HCL 45 MG PO TABS: 45.0000 mg | ORAL_TABLET | Freq: Every day | ORAL | Status: DC

## 2012-03-29 MED ORDER — SAXAGLIPTIN HCL 2.5 MG PO TABS: ORAL_TABLET | ORAL | Status: DC

## 2012-03-29 MED ORDER — OLMESARTAN MEDOXOMIL 20 MG PO TABS: 20.0000 mg | ORAL_TABLET | Freq: Every day | ORAL | Status: DC

## 2012-03-29 NOTE — Progress Notes (Signed)
Patient ID: Rodney [name], male   DOB: 07-06-1940, 72 y.o.   MRN: [id number]  Subjective:    Patient ID: Rodney [name], male    DOB: [birth year], 72 y.o.   MRN: [id number]  HPI History of Present Illness:   72 year-old patient who is seen today for a comprehensive evaluation. Problems include type 2 diabetes, history of prostate cancer followed closely by urology. He is a history of gout, which has been stable, as well as hypertension. Is followed by ophthalmology for glaucoma. No concerns or complaints today.  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  He is scheduled for cataract extraction surgery also on the left. He has a long history of chronic stable dyspnea on exertion, but no other cardiopulmonary complaints. He has been seen by urology recently and treated for a UTI. Laboratory studies reviewed and revealed a creatinine that has increased to 2.0. Diabetic medications include metformin.  He is also followed by nephrology do to chronic kidney disease   1. Risk factors, based on past  M,S,F history. Cardiovascular risk factors include dyslipidemia diabetes hypertension  2.  Physical activities: Remains fairly active;  avid fisherman  3.  Depression/mood: No history of depression or mood disorder  4.  Hearing: No significant deficits  5.  ADL's: Independent in all aspects of daily living 6.  Fall risk: Low 7.  Home safety: No problems identified  8.  Height weight, and visual acuity; height and weight stable no change in visual acuity  9.  Counseling: We'll regular exercise modest weight loss encouraged 10. Lab orders based on risk factors: Laboratory profile including renal function studies and lipid panel discussed hemoglobin A1c 6.6  11. Referral : We'll set up for a nephrology evaluation  12. Care plan: Follow nephrology and urology. Modest weight loss  encouraged  13. Cognitive assessment: Alert and oriented with normal affect     Preventive Screening-Counseling & Management  Alcohol-Tobacco  Smoking Status: quit   Allergies:  1) ! Ace Inhibitors   Past History:  Past Medical History:   prostate cancer  Diabetes mellitus, type II  Gout  Hypertension  ED  history of glaucoma  history incomplete Reiter's syndrome  Hyperlipidemia-low HDL cholesterol  Colonic polyps, hx of  Nephrolithiasis  Past Surgical History:  Reviewed history from 03/21/2008 and no changes required.  status post nerve sparing R LRP for prostate cancer, July 2007  Vasectomy  surgery for bone spur, right arm  flexible sigmoidoscopy 2003  colonoscopy November 2009   Family History:  Reviewed history from 11/21/2007 and no changes required.  father deceased, health unknown  mother, age 66  One brother  two sisters: one half-sister with metastatic colon cancer  Her and in a and in a knife and lives at night at a Social History:  Reviewed history from 11/21/2007 and no changes required.  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  HENT: Negative for hearing loss, ear pain, congestion, rhinorrhea, sneezing, mouth sores, trouble swallowing, neck pain, neck stiffness, dental problem, voice change, sinus pressure and tinnitus.   Eyes: Negative for photophobia, pain, redness and visual disturbance.  Respiratory: Negative for apnea, cough, choking, chest tightness, shortness of breath and wheezing.   Cardiovascular: Negative for chest pain, palpitations and leg swelling.  Gastrointestinal: Negative for nausea, vomiting, abdominal pain, diarrhea, constipation, blood in stool, abdominal distention, anal bleeding and rectal pain.  Genitourinary: Negative for dysuria, urgency, frequency, hematuria, flank pain, decreased urine  volume, discharge, penile swelling, scrotal swelling, difficulty urinating, genital sores and testicular pain.  Musculoskeletal: Negative for myalgias, back pain, joint swelling, arthralgias and gait problem.  Skin: Negative for color change, rash and wound.  Neurological: Negative for dizziness, tremors, seizures, syncope, facial asymmetry, speech difficulty, weakness, light-headedness, numbness and headaches.  Hematological: Negative for adenopathy. Does not bruise/bleed easily.  Psychiatric/Behavioral: Negative for suicidal ideas, hallucinations, behavioral problems, confusion, sleep disturbance, self-injury, dysphoric mood, decreased concentration and agitation. The patient is not nervous/anxious.        Objective:   Physical Exam  Constitutional: He appears well-developed and well-nourished.  HENT:  Head: Normocephalic and atraumatic.  Right Ear: External ear normal.  Left Ear: External ear normal.  Nose: Nose normal.  Mouth/Throat: Oropharynx is clear and moist.  Upper dentures in place Left pupil is irregular and dilated and nonreactive Cataracts noted  Eyes: Conjunctivae and EOM are normal. Pupils are equal, round, and reactive to light. No scleral icterus.  Neck: Normal range of motion. Neck supple. No JVD present. No thyromegaly present.  Cardiovascular: Regular rhythm, normal heart sounds and intact distal pulses.  Exam reveals no gallop and no friction rub.   No murmur heard. Pulmonary/Chest: Effort normal and breath sounds normal. He exhibits no tenderness.  Abdominal: Soft. Bowel sounds are normal. He exhibits no distension and no mass. There is no tenderness.  Musculoskeletal: Normal range of motion. He exhibits no edema and no tenderness.  Lymphadenopathy:    He has no cervical adenopathy.  Neurological: He is alert. He has normal reflexes. No cranial nerve deficit. Coordination normal.  Skin: Skin is warm and dry. No rash noted.  Psychiatric: He has a normal mood and  affect. His behavior is normal.          Assessment & Plan:    Preventive health examination Diabetes mellitus. Last glycemic control with hemoglobin A1c 6.6 Chronic kidney disease. 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 Creatinine 2.0. We'll set up to see nephrology. No microalbuminuria Glaucoma. Stable per ophthalmology History prostate cancer Hypertension controlled Dyslipidemia History colonic polyps. We'll need followup colonoscopy late this year or next

## 2012-03-29 NOTE — Patient Instructions (Signed)
Limit your sodium (Salt) intake    It is important that you exercise regularly, at least 20 minutes 3 to 4 times per week.  If you develop chest pain or shortness of breath seek  medical attention.  You need to lose weight.  Consider a lower calorie diet and regular exercise.   Please check your hemoglobin A1c every 3 months   

## 2012-06-28 ENCOUNTER — Ambulatory Visit: Payer: Medicare Other | Admitting: Internal Medicine

## 2012-07-02 ENCOUNTER — Ambulatory Visit (INDEPENDENT_AMBULATORY_CARE_PROVIDER_SITE_OTHER): Payer: Medicare Other | Admitting: Internal Medicine

## 2012-07-02 ENCOUNTER — Encounter: Payer: Self-pay | Admitting: Internal Medicine

## 2012-07-02 VITALS — BP 130/80 | HR 60 | Temp 97.6°F | Resp 20 | Wt 215.0 lb

## 2012-07-02 DIAGNOSIS — E119 Type 2 diabetes mellitus without complications: Secondary | ICD-10-CM

## 2012-07-02 DIAGNOSIS — I10 Essential (primary) hypertension: Secondary | ICD-10-CM

## 2012-07-02 DIAGNOSIS — I129 Hypertensive chronic kidney disease with stage 1 through stage 4 chronic kidney disease, or unspecified chronic kidney disease: Secondary | ICD-10-CM

## 2012-07-02 NOTE — Patient Instructions (Signed)
Limit your sodium (Salt) intake   Please check your hemoglobin A1c every 3 months    It is important that you exercise regularly, at least 20 minutes 3 to 4 times per week.  If you develop chest pain or shortness of breath seek  medical attention.   

## 2012-07-02 NOTE — Progress Notes (Signed)
Subjective:    Patient ID: Rodney [name], male    DOB: Jul 29, 1940, 72 y.o.   MRN: [id number]  HPI   72 year old patient who is seen today for followup. He has a history of type 2 diabetes which has been managed well on oral therapy. His last hemoglobin A1c 6.5. He has hypertension was also has been well-controlled. Has a history of gout and remains on allopurinol. No gouty arthritis and a number of years. He has a history of Reiter's syndrome with iritis. His eye condition has nicely responded to therapy. He continues to be followed by renal medicine do to his chronic kidney disease. This has been stable.  Past Medical History  Diagnosis Date  . DIABETES MELLITUS, TYPE II 07/19/2007  . HYPERLIPIDEMIA 12/02/2008  . GOUT 07/19/2007  . HYPERTENSION 07/19/2007  . COLONIC POLYPS, HX OF 12/02/2008  . Cancer     prostate  . Erectile dysfunction   . Glaucoma   . Reiter syndrome     incomplete    History   Social History  . Marital Status: Married    Spouse Name: N/A    Number of Children: N/A  . Years of Education: N/A   Occupational History  . Not on file.   Social History Main Topics  . Smoking status: Former Smoker    Types: Cigarettes    Quit date: 01/24/1978  . Smokeless tobacco: Never Used  . Alcohol Use: Yes     Comment: occasionally -socially  . Drug Use: No  . Sexually Active: Not on file     Comment: 2 childern   Other Topics Concern  . Not on file   Social History Narrative  . No narrative on file    Past Surgical History  Procedure Laterality Date  . Vasectomy    . Elbow surgery      bone spur - right    Family History  Problem Relation Age of Onset  . Cancer Sister     metastatic colon ca    Allergies  Allergen Reactions  . Ace Inhibitors     Current Outpatient Prescriptions on File Prior to Visit  Medication Sig Dispense Refill  . acyclovir (ZOVIRAX) 400 MG tablet       . allopurinol (ZYLOPRIM) 300 MG tablet Take 1 tablet (300 mg total)  by mouth daily.  90 tablet  1  . Blood Glucose Monitoring Suppl (ACCU-CHEK AVIVA) kit by Other route. Use as instructed       . glucose blood (ACCU-CHEK AVIVA) test strip 1 each by Other route 2 (two) times daily.  100 each  3  . Lancets (ACCU-CHEK MULTICLIX) lancets 1 each by Other route daily. Use as instructed       . niacin (NIASPAN) 1000 MG CR tablet Take 1,000 mg by mouth at bedtime.        Marland Kitchen olmesartan (BENICAR) 20 MG tablet Take 1 tablet (20 mg total) by mouth daily.  90 tablet  4  . pioglitazone (ACTOS) 45 MG tablet Take 1 tablet (45 mg total) by mouth daily.  90 tablet  6  . saxagliptin HCl (ONGLYZA) 2.5 MG TABS tablet TAKE 1 TABLET BY MOUTH DAILY  90 tablet  4   No current facility-administered medications on file prior to visit.    BP 130/80  Pulse 60  Temp(Src) 97.6 F (36.4 C) (Oral)  Resp 20  Wt 215 lb (97.523 kg)  BMI 32.43 kg/m2  SpO2 99%  Review of Systems  Constitutional: Negative for fever, chills, appetite change and fatigue.  HENT: Negative for hearing loss, ear pain, congestion, sore throat, trouble swallowing, neck stiffness, dental problem, voice change and tinnitus.   Eyes: Negative for pain, discharge and visual disturbance.  Respiratory: Negative for cough, chest tightness, wheezing and stridor.   Cardiovascular: Negative for chest pain, palpitations and leg swelling.  Gastrointestinal: Negative for nausea, vomiting, abdominal pain, diarrhea, constipation, blood in stool and abdominal distention.  Genitourinary: Negative for urgency, hematuria, flank pain, discharge, difficulty urinating and genital sores.  Musculoskeletal: Negative for myalgias, back pain, joint swelling, arthralgias and gait problem.  Skin: Negative for rash.  Neurological: Negative for dizziness, syncope, speech difficulty, weakness, numbness and headaches.  Hematological: Negative for adenopathy. Does not bruise/bleed easily.  Psychiatric/Behavioral: Negative for behavioral  problems and dysphoric mood. The patient is not nervous/anxious.        Objective:   Physical Exam  Constitutional: He is oriented to person, place, and time. He appears well-developed.  HENT:  Head: Normocephalic.  Right Ear: External ear normal.  Left Ear: External ear normal.  Eyes: Conjunctivae and EOM are normal.  Neck: Normal range of motion.  Cardiovascular: Normal rate and normal heart sounds.   Pulmonary/Chest: Breath sounds normal.  Abdominal: Bowel sounds are normal.  Musculoskeletal: Normal range of motion. He exhibits no edema and no tenderness.  Neurological: He is alert and oriented to person, place, and time.  Psychiatric: He has a normal mood and affect. His behavior is normal.          Assessment & Plan:   Diabetes mellitus. Will check a hemoglobin A1c Hypertension well controlled History gout stable Chronic kidney disease stable  Recheck 6 months Followup renal medicine

## 2012-07-17 ENCOUNTER — Other Ambulatory Visit: Payer: Self-pay | Admitting: Internal Medicine

## 2012-08-02 ENCOUNTER — Other Ambulatory Visit: Payer: Self-pay | Admitting: Internal Medicine

## 2012-08-02 DIAGNOSIS — H2000 Unspecified acute and subacute iridocyclitis: Secondary | ICD-10-CM

## 2012-08-06 ENCOUNTER — Other Ambulatory Visit: Payer: Medicare Other

## 2012-08-06 ENCOUNTER — Ambulatory Visit (INDEPENDENT_AMBULATORY_CARE_PROVIDER_SITE_OTHER): Payer: Medicare Other | Admitting: Internal Medicine

## 2012-08-06 DIAGNOSIS — Z299 Encounter for prophylactic measures, unspecified: Secondary | ICD-10-CM

## 2012-08-06 DIAGNOSIS — H2000 Unspecified acute and subacute iridocyclitis: Secondary | ICD-10-CM

## 2012-08-06 LAB — CBC WITH DIFFERENTIAL/PLATELET
Basophils Relative: 0.5 % (ref 0.0–3.0)
Eosinophils Absolute: 0.2 10*3/uL (ref 0.0–0.7)
Hemoglobin: 12 g/dL — ABNORMAL LOW (ref 13.0–17.0)
Lymphs Abs: 1.4 10*3/uL (ref 0.7–4.0)
MCHC: 33.5 g/dL (ref 30.0–36.0)
MCV: 103 fl — ABNORMAL HIGH (ref 78.0–100.0)
Monocytes Absolute: 0.4 10*3/uL (ref 0.1–1.0)
Neutro Abs: 3.3 10*3/uL (ref 1.4–7.7)
RBC: 3.48 Mil/uL — ABNORMAL LOW (ref 4.22–5.81)
RDW: 15.5 % — ABNORMAL HIGH (ref 11.5–14.6)

## 2012-08-09 LAB — TB SKIN TEST: TB Skin Test: NEGATIVE

## 2012-08-10 ENCOUNTER — Telehealth: Payer: Self-pay | Admitting: *Deleted

## 2012-08-10 NOTE — Telephone Encounter (Signed)
Called Dr. Florence Canner office spoke to [place] told her lab results faxed over that Dr. Vonna Kotyk ordered. Lab results faxed.

## 2012-08-13 ENCOUNTER — Telehealth: Payer: Self-pay | Admitting: Internal Medicine

## 2012-08-13 LAB — HLA-B27 ANTIGEN: DNA Result:: DETECTED — AB

## 2012-08-13 NOTE — Telephone Encounter (Signed)
Wife is calling with confusion about what medication pt is supposed to be taking for diabetes.  Pt used to take Metformin for a long time but then Dr Kirtland Bouchard took pt off of that because pt was having a loss of kidney function. Pt was then put on Saxagliptin, Onglyza.  During the last office visit, Dr Kirtland Bouchard gave pt some samples of Kombiglyze HR (which is Saxagliptin and Metformin).  Caller wants to know if the new medication,Kombiglyze, is ok for pt to take with his kidney function or if pt should just be on the Onglyza.  Office please follow up with caller regarding medication.

## 2012-08-13 NOTE — Telephone Encounter (Signed)
Caller is correct-Onglyza only

## 2012-08-13 NOTE — Telephone Encounter (Signed)
Spoke to pt's wife Erskine Squibb told her pt is suppose to take Onglyza only per Dr. Amador Cunas. Pt verbalized understanding.

## 2012-12-17 ENCOUNTER — Telehealth: Payer: Self-pay

## 2012-12-17 NOTE — Telephone Encounter (Signed)
Spoke with pt and he is aware that he needs to have an LDL drawn for the [employer] audit. Pt kwows to contact the office to set up an appt. Note sent to Cinda Quest also.

## 2013-03-15 ENCOUNTER — Encounter: Payer: Self-pay | Admitting: Radiation Oncology

## 2013-03-15 NOTE — Progress Notes (Signed)
GU Location of Tumor / Histology: adenocarcinoma prostate  If Prostate Cancer, Gleason Score is (3 + 4=7) and PSA is (0.27) on 02/22/2013  Patient presented 04/16/2012 with a rising PSA following 2007 radical prostatectomy    Past/Anticipated interventions by urology, if any: s/p non nerve-sparing RLRP on 07/2005 followed by years of surveillance  Past/Anticipated interventions by medical oncology, if any: None  Weight changes, if any: Weight stable  Bowel/Bladder complaints, if any: stress incontinence   Nausea/Vomiting, if any: None noted  Pain issues, if any:  None noted  SAFETY ISSUES:  Prior radiation? NO  Pacemaker/ICD? NO  Possible current pregnancy? N/A  Is the patient on methotrexate? N/A  Current Complaints / other details:  73 year old male. Biochemical recurrent prostate cancer. Referral from [facility] reference salvage radiation therapy.

## 2013-03-18 ENCOUNTER — Ambulatory Visit: Payer: Medicare Other

## 2013-03-18 ENCOUNTER — Ambulatory Visit: Payer: Medicare Other | Admitting: Radiation Oncology

## 2013-04-08 ENCOUNTER — Ambulatory Visit
Admission: RE | Admit: 2013-04-08 | Discharge: 2013-04-08 | Disposition: A | Discharge status: Home / Self Care | Payer: Medicare Other | Source: Ambulatory Visit | Attending: Radiation Oncology | Admitting: Radiation Oncology

## 2013-04-08 ENCOUNTER — Encounter: Payer: Self-pay | Admitting: Radiation Oncology

## 2013-04-08 VITALS — BP 128/65 | HR 79 | Temp 98.0°F | Resp 16 | Ht 68.0 in | Wt 220.1 lb

## 2013-04-08 DIAGNOSIS — C61 Malignant neoplasm of prostate: Secondary | ICD-10-CM

## 2013-04-08 DIAGNOSIS — Z860100 Personal history of colon polyps, unspecified: Secondary | ICD-10-CM | POA: Insufficient documentation

## 2013-04-08 DIAGNOSIS — E119 Type 2 diabetes mellitus without complications: Secondary | ICD-10-CM | POA: Insufficient documentation

## 2013-04-08 DIAGNOSIS — K219 Gastro-esophageal reflux disease without esophagitis: Secondary | ICD-10-CM | POA: Insufficient documentation

## 2013-04-08 DIAGNOSIS — I10 Essential (primary) hypertension: Secondary | ICD-10-CM | POA: Insufficient documentation

## 2013-04-08 DIAGNOSIS — E785 Hyperlipidemia, unspecified: Secondary | ICD-10-CM | POA: Insufficient documentation

## 2013-04-08 NOTE — Progress Notes (Signed)
See progress note under physician encounter. 

## 2013-04-08 NOTE — Progress Notes (Signed)
Radiation Oncology         [phone] ________________________________  Initial outpatient Consultation  Name: Rodney [name] MRN: [id number]  Date: 04/08/2013  DOB: 02-Oct-1940  WU:[id number],FAOZH Pilar Plate, MD  Dutch Gray, MD   REFERRING PHYSICIAN: Dutch Gray, MD  DIAGNOSIS: 73 y.o. 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 gentleman who underwent radical prostatectomy on 08/22/2005. At that time, the patient was found to have the following pathologic findings.: 1. PROSTATE: - PROSTATIC ADENOCARCINOMA, GLEASON' S SCORE 3+4=7. - PROSTATIC ADENOCARCINOMA INVOLVES THE RIGHT AND LEFT LOBES OF THE PROSTATE. - THE SURGICAL MARGINS ARE NEGATIVE. - SEE COMMENT.  2. RIGHT PELVIC LYMPH NODES: SIX (6) LYMPH NODES, NEGATIVE FOR CARCINOMA.  3. LEFT PELVIC LYMPH NODES: FIVE (5) LYMPH NODES, NEGATIVE FOR CARCINOMA.  COMMENT 1. ONCOLOGY TABLE-PROSTATE, RADICAL RESECTION/PROSTATECTOMY  1. Histology: Prostatic adenocarcinoma 2. Gleason' s Score: 3+4=7 3. Involving (half lobe or less, one or both lobes): Both lobes 4. Capsular extension (extraprostatic): Not identified 5. Margins: Negative 6. Seminal vesicles: Negative 7. Fixed or invading structures other than seminal vesicles: N/A 8. Lymph nodes: # examined 11; # positive 0 9. TNM code: pT2c N0 MX 10. Comments: Evaluation of the prostatectomy specimen demonstrates prostatic adenocarcinoma involving the right and left lobes of the prostate. The overall Gleason pattern is compatible with 3+4=7. The surgical margins are negative. (MS:caf 08/23/05)  Postoperatively, the patient's PSA was initially undetectable. His PSA remained undetectable 08/05/2009. His PSA rose to 0.021 05/11/2010. He went to 0.03 08/17/2010. It increased to 0.11 on 09/20/2011. A further increase to 0.13 on 04/17/1998 and  his PSA increased to 0.22 11/26/2012 and finally to 0.27 on 02/22/2013. The patient has been closely followed by urology and, been referred today for discussion of potential salvage prostatic fossa radiotherapy.  PREVIOUS RADIATION THERAPY: No  PAST MEDICAL HISTORY:  has a past medical history of DIABETES MELLITUS, TYPE II (07/19/2007); HYPERLIPIDEMIA (12/02/2008); GOUT (07/19/2007); HYPERTENSION (07/19/2007); COLONIC POLYPS, HX OF (12/02/2008); Erectile dysfunction; Glaucoma; Reiter syndrome; Prostate cancer; GERD (gastroesophageal reflux disease); and Kidney stones.    PAST SURGICAL HISTORY: Past Surgical History  Procedure Laterality Date  . Vasectomy    . Elbow surgery      bone spur - right  . Prostatectomy    . Bladder neck dilation      FAMILY HISTORY: family history includes Cancer in his sister.  SOCIAL HISTORY:  reports that he quit smoking about 35 years ago. His smoking use included Cigarettes. He smoked 0.00 packs per day. He has never used smokeless tobacco. He reports that he drinks alcohol. He reports that he does not use illicit drugs.  ALLERGIES: Ace inhibitors  MEDICATIONS:  Current Outpatient Prescriptions  Medication Sig Dispense Refill  . allopurinol (ZYLOPRIM) 300 MG tablet Take 1 tablet (300 mg total) by mouth daily.  90 tablet  1  . BENICAR 20 MG tablet TAKE ONE TABLET BY MOUTH ONCE DAILY  90 tablet  1  . Blood Glucose Monitoring Suppl (ACCU-CHEK AVIVA) kit by Other route. Use as instructed       . Lancets (ACCU-CHEK MULTICLIX) lancets 1 each by Other route daily. Use as instructed       . niacin (NIASPAN) 1000 MG CR tablet Take 1,000 mg by mouth at bedtime.        . saxagliptin HCl (ONGLYZA) 2.5 MG TABS tablet TAKE 1  TABLET BY MOUTH DAILY  90 tablet  4  . timolol (TIMOPTIC) 0.5 % ophthalmic solution 1 drop 2 (two) times daily.      . travoprost, benzalkonium, (TRAVATAN) 0.004 % ophthalmic solution 1 drop at bedtime.      Marland Kitchen acyclovir (ZOVIRAX) 400 MG tablet         . clobetasol cream (TEMOVATE) 1.58 % Apply 1 application topically 2 (two) times daily.      Marland Kitchen glimepiride (AMARYL) 2 MG tablet Take 2 mg by mouth daily with breakfast.      . glucose blood (ACCU-CHEK AVIVA) test strip 1 each by Other route 2 (two) times daily.  100 each  3  . latanoprost (XALATAN) 0.005 % ophthalmic solution 1 drop at bedtime.      . pioglitazone (ACTOS) 45 MG tablet Take 1 tablet (45 mg total) by mouth daily.  90 tablet  6   No current facility-administered medications for this encounter.    REVIEW OF SYSTEMS:  A 15 point review of systems is documented in the electronic medical record. This was obtained by the nursing staff. However, I reviewed this with the patient to discuss relevant findings and make appropriate changes.  A comprehensive review of systems was negative..  The patient completed an IPSS and IIEF questionnaire.  His IPSS score was 10 indicating moderate urinary obstructive symptoms.    PHYSICAL EXAM: This patient is in no acute distress.  He is alert and oriented.   height is _0  (1.727 m) and weight is 220 lb 1.6 oz (99.837 kg). His oral temperature is 98 F (36.7 C). His blood pressure is 128/65 and his pulse is 79. His respiration is 16 and oxygen saturation is 100%.  He exhibits no respiratory distress or labored breathing.  He appears neurologically intact.  His mood is pleasant.  His affect is appropriate.  Please note the digital rectal exam findings described above.  KPS = 90  100 - Normal; no complaints; no evidence of disease. 90   - Able to carry on normal activity; minor signs or symptoms of disease. 80   - Normal activity with effort; some signs or symptoms of disease. 58   - Cares for self; unable to carry on normal activity or to do active work. 60   - Requires occasional assistance, but is able to care for most of his personal needs. 50   - Requires considerable assistance and frequent medical care. 26   - Disabled; requires special care  and assistance. 91   - Severely disabled; hospital admission is indicated although death not imminent. 52   - Very sick; hospital admission necessary; active supportive treatment necessary. 10   - Moribund; fatal processes progressing rapidly. 0     - Dead  Karnofsky DA, Abelmann [place], Craver LS and Burchenal [facility] [phone]) The use of the nitrogen mustards in the palliative treatment of carcinoma: with particular reference to bronchogenic carcinoma Cancer 1 634-56   LABORATORY DATA:  Lab Results  Component Value Date   WBC 5.3 08/06/2012   HGB 12.0* 08/06/2012   HCT 35.8* 08/06/2012   MCV 103.0* 08/06/2012   PLT 185.0 08/06/2012   Lab Results  Component Value Date   NA 141 11/26/2010   K 5.4* 11/26/2010   CL 103 11/26/2010   CO2 27 11/26/2010   Lab Results  Component Value Date   ALT 28 11/26/2010   AST 26 11/26/2010   ALKPHOS 71 11/26/2010   BILITOT 0.7 11/26/2010  RADIOGRAPHY: No results found.    IMPRESSION: This gentleman is a 73 y.o. 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  Essentially, he has developed biochemical failure following radical prostatectomy. In terms of prognostic factors, the favorable pathology features noted at the time of his prostatectomy (negative margins, no extracapsular extension, no seminal vesicle involvement) represent negative prognostic factors in the setting of salvage radiotherapy. However, in terms of positive prognostic factors, his doubling time of almost 18 months as well as his interval from prostatectomy approaching 8 years represent favorable factors.  At this point, prostatic fossa radiotherapy would be associated with some chance for cure, ranging from 25-50%, while foregoing prostatic fossa radiotherapy will provide no opportunity for cure.  PLAN:Today, I talked to the patient and family about the findings and work-up thus far.  We discussed the natural history of  post-prostatectomy biochemical recurrence and general treatment, highlighting the role or radiotherapy in the management.  We discussed the available radiation techniques, and focused on the details of logistics and delivery.  We reviewed the anticipated acute and late sequelae associated with radiation in this setting.  The patient was encouraged to ask questions that I answered to the best of my ability.  I filled out a patient counseling form during our discussion including treatment diagrams.  We retained a copy for our records.    The patient would like to proceed with additional PSA surveillance for the next few months. He did express some interest in proceeding with prostatic fossa radiotherapy but had concerns about initiating treatment prior to travel plans that he has arranged the summer. Accordingly, he is going to guage his opinion on the next few PSA determinations with a tentative plan to consider prostatic fossa radiotherapy in the fall of 2015. The patient plans to followup with Dr. Alinda Money in urology. He will call or return to radiation oncology clinic if he has additional questions or elects to proceed with treatment.  I spent 60 minutes minutes face to face with the patient and more than 50% of that time was spent in counseling and/or coordination of care.   ------------------------------------------------  Sheral Apley. Tammi Klippel, M.D.

## 2013-04-08 NOTE — Progress Notes (Signed)
Reports occasional episodes of stress incontinence when he coughs or sneezes. Reports nocturia x 5. Denies weight loss or decreased appetite. Denies bone pain. Denies night sweats. Denies diarrhea, blood in stool or pain associated with bowel movements. Report occasional difficulty emptying his bladder. Denies nausea, vomiting, headache, or dizziness.

## 2013-04-15 ENCOUNTER — Other Ambulatory Visit: Payer: Self-pay | Admitting: Internal Medicine

## 2013-04-16 ENCOUNTER — Other Ambulatory Visit: Payer: Self-pay | Admitting: Internal Medicine

## 2013-06-14 ENCOUNTER — Other Ambulatory Visit: Payer: Self-pay | Admitting: Internal Medicine

## 2013-09-11 ENCOUNTER — Other Ambulatory Visit: Payer: Self-pay | Admitting: Internal Medicine

## 2013-09-12 NOTE — Telephone Encounter (Signed)
Wife is calling office for refill of Actos diabetic medication. She has called pharmacy but they have not heard from the office.  Mr. Knueppel only has 1 pill left.  Please contact wife regarding medication refill.  Wife- Opal Sidles  . [phone]

## 2013-09-13 NOTE — Telephone Encounter (Signed)
Called home number and left a vm for pt; medication called in for a 30 day supply.  Pt has not been seen since 6.9.2014. Advised pt that a follow up appointment is needed for further refills.

## 2013-09-19 ENCOUNTER — Encounter: Payer: Self-pay | Admitting: Internal Medicine

## 2013-09-19 ENCOUNTER — Ambulatory Visit (INDEPENDENT_AMBULATORY_CARE_PROVIDER_SITE_OTHER): Payer: Medicare Other | Admitting: Internal Medicine

## 2013-09-19 VITALS — BP 145/89 | HR 75 | Temp 97.8°F | Resp 20 | Ht 68.0 in | Wt 219.0 lb

## 2013-09-19 DIAGNOSIS — C61 Malignant neoplasm of prostate: Secondary | ICD-10-CM

## 2013-09-19 DIAGNOSIS — Z860100 Personal history of colon polyps, unspecified: Secondary | ICD-10-CM

## 2013-09-19 DIAGNOSIS — E119 Type 2 diabetes mellitus without complications: Secondary | ICD-10-CM

## 2013-09-19 DIAGNOSIS — N200 Calculus of kidney: Secondary | ICD-10-CM

## 2013-09-19 DIAGNOSIS — N183 Chronic kidney disease, stage 3 unspecified: Secondary | ICD-10-CM

## 2013-09-19 DIAGNOSIS — E785 Hyperlipidemia, unspecified: Secondary | ICD-10-CM

## 2013-09-19 DIAGNOSIS — M109 Gout, unspecified: Secondary | ICD-10-CM

## 2013-09-19 LAB — CBC WITH DIFFERENTIAL/PLATELET
BASOS ABS: 0 10*3/uL (ref 0.0–0.1)
BASOS PCT: 0.6 % (ref 0.0–3.0)
EOS PCT: 2.9 % (ref 0.0–5.0)
Eosinophils Absolute: 0.1 10*3/uL (ref 0.0–0.7)
HEMATOCRIT: 38.6 % — AB (ref 39.0–52.0)
HEMOGLOBIN: 13.2 g/dL (ref 13.0–17.0)
Lymphocytes Relative: 23 % (ref 12.0–46.0)
Lymphs Abs: 1.1 10*3/uL (ref 0.7–4.0)
MCHC: 34.1 g/dL (ref 30.0–36.0)
MCV: 98.2 fl (ref 78.0–100.0)
MONOS PCT: 7.9 % (ref 3.0–12.0)
Monocytes Absolute: 0.4 10*3/uL (ref 0.1–1.0)
NEUTROS ABS: 3.2 10*3/uL (ref 1.4–7.7)
Neutrophils Relative %: 65.6 % (ref 43.0–77.0)
Platelets: 207 10*3/uL (ref 150.0–400.0)
RBC: 3.93 Mil/uL — AB (ref 4.22–5.81)
RDW: 14.5 % (ref 11.5–15.5)
WBC: 4.8 10*3/uL (ref 4.0–10.5)

## 2013-09-19 LAB — TSH: TSH: 1.89 u[IU]/mL (ref 0.35–4.50)

## 2013-09-19 LAB — COMPREHENSIVE METABOLIC PANEL
ALT: 17 U/L (ref 0–53)
AST: 18 U/L (ref 0–37)
Albumin: 4.1 g/dL (ref 3.5–5.2)
Alkaline Phosphatase: 57 U/L (ref 39–117)
BILIRUBIN TOTAL: 1.1 mg/dL (ref 0.2–1.2)
BUN: 24 mg/dL — AB (ref 6–23)
CO2: 27 mEq/L (ref 19–32)
Calcium: 9.5 mg/dL (ref 8.4–10.5)
Chloride: 104 mEq/L (ref 96–112)
Creatinine, Ser: 1.9 mg/dL — ABNORMAL HIGH (ref 0.4–1.5)
GFR: 37.54 mL/min — AB (ref >60.00)
Glucose, Bld: 118 mg/dL — ABNORMAL HIGH (ref 70–99)
Potassium: 4.7 mEq/L (ref 3.5–5.1)
Sodium: 137 mEq/L (ref 135–145)
Total Protein: 7.7 g/dL (ref 6.0–8.3)

## 2013-09-19 LAB — MICROALBUMIN / CREATININE URINE RATIO
Creatinine,U: 85.8 mg/dL
MICROALB/CREAT RATIO: 0.9 mg/g (ref 0.0–30.0)
Microalb, Ur: 0.8 mg/dL (ref 0.0–1.9)

## 2013-09-19 LAB — LIPID PANEL
CHOL/HDL RATIO: 12
Cholesterol: 255 mg/dL — ABNORMAL HIGH (ref 0–200)
HDL: 20.7 mg/dL — ABNORMAL LOW (ref >39.00)
NONHDL: 234.3
TRIGLYCERIDES: 406 mg/dL — AB (ref 0.0–149.0)
VLDL: 81.2 mg/dL — ABNORMAL HIGH (ref 0.0–40.0)

## 2013-09-19 LAB — HEMOGLOBIN A1C: HEMOGLOBIN A1C: 5.9 % (ref 4.6–6.5)

## 2013-09-19 LAB — LDL CHOLESTEROL, DIRECT: Direct LDL: 166.1 mg/dL

## 2013-09-19 NOTE — Patient Instructions (Addendum)
Limit your sodium (Salt) intake    It is important that you exercise regularly, at least 20 minutes 3 to 4 times per week.  If you develop chest pain or shortness of breath seek  medical attention.  Please see your eye doctor yearly to check for diabetic eye damage   Return in 6 months for follow-up

## 2013-09-19 NOTE — Progress Notes (Signed)
Subjective:    Patient ID: Rodney [name], male    DOB: 1940/02/14, 73 y.o.   MRN: [id number]  HPI  Lab Results  Component Value Date   HGBA1C 6.4 07/02/2012    73 year old patient who is seen today for followup.  He has a history of type 2 diabetes, but has not been seen in over one year.  He has treated hypertension. He is followed by urology for prostate cancer.  He states that his PSA has been increasing and he is scheduled for R., T. He has history of chronic kidney disease, and has been followed by renal medicine.  He has dyslipidemia.  Apparently no allergy to statin therapy.  He has been using niacin due to a low T. L., cholesterol  Past Medical History  Diagnosis Date  . DIABETES MELLITUS, TYPE II 07/19/2007  . HYPERLIPIDEMIA 12/02/2008  . GOUT 07/19/2007  . HYPERTENSION 07/19/2007  . COLONIC POLYPS, HX OF 12/02/2008  . Erectile dysfunction   . Glaucoma   . Reiter syndrome     incomplete  . Prostate cancer   . GERD (gastroesophageal reflux disease)   . Kidney stones     History   Social History  . Marital Status: Married    Spouse Name: N/A    Number of Children: N/A  . Years of Education: N/A   Occupational History  . Not on file.   Social History Main Topics  . Smoking status: Former Smoker    Types: Cigarettes    Quit date: 01/24/1978  . Smokeless tobacco: Never Used  . Alcohol Use: Yes     Comment: occasionally -socially  . Drug Use: No  . Sexual Activity: Not on file     Comment: 2 childern   Other Topics Concern  . Not on file   Social History Narrative  . No narrative on file    Past Surgical History  Procedure Laterality Date  . Vasectomy    . Elbow surgery      bone spur - right  . Prostatectomy    . Bladder neck dilation      Family History  Problem Relation Age of Onset  . Cancer Sister     metastatic colon ca    Allergies  Allergen Reactions  . Ace Inhibitors     Current Outpatient Prescriptions on File Prior to Visit    Medication Sig Dispense Refill  . acyclovir (ZOVIRAX) 400 MG tablet Take 400 mg by mouth daily.       Marland Kitchen BENICAR 20 MG tablet TAKE ONE TABLET BY MOUTH ONCE DAILY  90 tablet  1  . BENICAR 20 MG tablet TAKE ONE TABLET BY MOUTH ONCE DAILY  90 tablet  1  . Blood Glucose Monitoring Suppl (ACCU-CHEK AVIVA) kit by Other route. Use as instructed       . clobetasol cream (TEMOVATE) 3.15 % Apply 1 application topically 2 (two) times daily.      Marland Kitchen glimepiride (AMARYL) 2 MG tablet Take 2 mg by mouth daily with breakfast.      . glucose blood (ACCU-CHEK AVIVA) test strip 1 each by Other route 2 (two) times daily.  100 each  3  . Lancets (ACCU-CHEK MULTICLIX) lancets 1 each by Other route daily. Use as instructed       . latanoprost (XALATAN) 0.005 % ophthalmic solution 1 drop at bedtime.      . niacin (NIASPAN) 1000 MG CR tablet Take 1,000 mg by mouth at bedtime.        Marland Kitchen  ONGLYZA 2.5 MG TABS tablet TAKE ONE TABLET BY MOUTH ONCE DAILY  90 tablet  0  . pioglitazone (ACTOS) 45 MG tablet TAKE ONE TABLET BY MOUTH ONCE DAILY  90 tablet  0  . pioglitazone (ACTOS) 45 MG tablet TAKE ONE TABLET BY MOUTH ONCE DAILY  30 tablet  0  . timolol (TIMOPTIC) 0.5 % ophthalmic solution 1 drop 2 (two) times daily.      . travoprost, benzalkonium, (TRAVATAN) 0.004 % ophthalmic solution 1 drop at bedtime.       No current facility-administered medications on file prior to visit.    BP 145/89  Pulse 75  Temp(Src) 97.8 F (36.6 C) (Oral)  Resp 20  Ht '5\' 8"'  (1.727 m)  Wt 219 lb (99.338 kg)  BMI 33.31 kg/m2    Review of Systems  Constitutional: Negative for fever, chills, appetite change and fatigue.  HENT: Negative for congestion, dental problem, ear pain, hearing loss, sore throat, tinnitus, trouble swallowing and voice change.   Eyes: Negative for pain, discharge and visual disturbance.  Respiratory: Negative for cough, chest tightness, wheezing and stridor.   Cardiovascular: Negative for chest pain, palpitations and  leg swelling.  Gastrointestinal: Negative for nausea, vomiting, abdominal pain, diarrhea, constipation, blood in stool and abdominal distention.  Genitourinary: Negative for urgency, hematuria, flank pain, discharge, difficulty urinating and genital sores.  Musculoskeletal: Negative for arthralgias, back pain, gait problem, joint swelling, myalgias and neck stiffness.  Skin: Negative for rash.  Neurological: Negative for dizziness, syncope, speech difficulty, weakness, numbness and headaches.  Hematological: Negative for adenopathy. Does not bruise/bleed easily.  Psychiatric/Behavioral: Negative for behavioral problems and dysphoric mood. The patient is not nervous/anxious.        Objective:   Physical Exam  Constitutional: He is oriented to person, place, and time. He appears well-developed.  HENT:  Head: Normocephalic.  Right Ear: External ear normal.  Left Ear: External ear normal.  Eyes: Conjunctivae and EOM are normal.  Neck: Normal range of motion.  Cardiovascular: Normal rate and normal heart sounds.   Pulmonary/Chest: Breath sounds normal.  Abdominal: Bowel sounds are normal.  Musculoskeletal: Normal range of motion. He exhibits no edema and no tenderness.  Neurological: He is alert and oriented to person, place, and time.  Psychiatric: He has a normal mood and affect. His behavior is normal.          Assessment & Plan:   Diabetes mellitus.  We'll check a hemoglobin A1c, lipid profile, and urine for microalbumin Hypertension stable continue Benicar Dyslipidemia with low HDL.  Risks and benefits of statin therapy discussed History of cataracts History of prostate cancer.  Followup urology  Followup 6 months

## 2013-09-19 NOTE — Progress Notes (Signed)
Pre visit review using our clinic review tool, if applicable. No additional management support is needed unless otherwise documented below in the visit note. 

## 2013-09-20 ENCOUNTER — Other Ambulatory Visit: Payer: Self-pay | Admitting: *Deleted

## 2013-09-20 MED ORDER — ATORVASTATIN CALCIUM 20 MG PO TABS: 20.0000 mg | ORAL_TABLET | Freq: Every day | ORAL | Status: DC

## 2013-10-07 ENCOUNTER — Encounter: Payer: Self-pay | Admitting: Radiation Oncology

## 2013-10-07 NOTE — Progress Notes (Signed)
Radiation Oncology         (336) [phone] ________________________________  Initial outpatient Consultation  Name: Rodney [name] MRN: [id number]  Date: 10/09/2013  DOB: [birth year]  TG:[id number],AJGOT Pilar Plate, MD  [name], MD   REFERRING PHYSICIAN: [name], MD  DIAGNOSIS: 73 y.o. 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 gentleman who underwent radical prostatectomy on 08/22/2005. At that time, the patient was found to have the following pathologic findings.:   1. PROSTATE:  - PROSTATIC ADENOCARCINOMA, GLEASON' S SCORE 3+4=7.  - PROSTATIC ADENOCARCINOMA INVOLVES THE RIGHT AND LEFT     LOBES OF THE     PROSTATE.  - THE SURGICAL MARGINS ARE NEGATIVE.  - SEE COMMENT.  2. RIGHT PELVIC LYMPH NODES: SIX (6) LYMPH NODES, NEGATIVE FOR CARCINOMA.  3. LEFT PELVIC LYMPH NODES: FIVE (5) LYMPH NODES, NEGATIVE FOR CARCINOMA.  COMMENT 1. ONCOLOGY TABLE-PROSTATE, RADICAL RESECTION/PROSTATECTOMY  1. Histology: Prostatic adenocarcinoma 2. Gleason' s Score: 3+4=7 3. Involving (half lobe or less, one or both lobes): Both lobes 4. Capsular extension (extraprostatic): Not identified 5. Margins: Negative 6. Seminal vesicles: Negative 7. Fixed or invading structures other than seminal vesicles: N/A 8. Lymph nodes: # examined 11; # positive 0 9. TNM code: pT2c N0 MX 10. Comments: Evaluation of the prostatectomy specimen demonstrates prostatic adenocarcinoma involving the right and left lobes of the prostate. The overall Gleason pattern is compatible with 3+4=7. The surgical margins are negative. (MS:caf 08/23/05)  Postoperatively, the patient's PSA was initially undetectable. His PSA remained undetectable 08/05/2009. His PSA rose to 0.021 05/11/2010. He went to 0.03 08/17/2010. It increased to 0.11 on 09/20/2011. A further increase to 0.13  on 04/17/1998 and his PSA increased to 0.22 11/26/2012 and finally to 0.27 on 02/22/2013.   I met with him to offer radiotherapy on 04/08/13, but, he declined at that time.  Since then, his PSA has increased further to 0.51 on 09/25/13.  The patient has been referred today for discussion of potential salvage prostatic fossa radiotherapy.  PREVIOUS RADIATION THERAPY: No  PAST MEDICAL HISTORY:  has a past medical history of DIABETES MELLITUS, TYPE II (07/19/2007); HYPERLIPIDEMIA (12/02/2008); GOUT (07/19/2007); HYPERTENSION (07/19/2007); COLONIC POLYPS, HX OF (12/02/2008); Erectile dysfunction; Glaucoma; Reiter syndrome; Prostate cancer; GERD (gastroesophageal reflux disease); and Kidney stones.    PAST SURGICAL HISTORY: Past Surgical History  Procedure Laterality Date  . Vasectomy    . Elbow surgery      bone spur - right  . Prostatectomy    . Bladder neck dilation      FAMILY HISTORY: family history includes Cancer in his sister.  SOCIAL HISTORY:  reports that he quit smoking about 35 years ago. His smoking use included Cigarettes. He smoked 0.00 packs per day. He has never used smokeless tobacco. He reports that he drinks alcohol. He reports that he does not use illicit drugs.  ALLERGIES: Ace inhibitors  MEDICATIONS:  Current Outpatient Prescriptions  Medication Sig Dispense Refill  . atorvastatin (LIPITOR) 20 MG tablet Take 1 tablet (20 mg total) by mouth daily.  90 tablet  1  . BENICAR 20 MG tablet TAKE ONE TABLET BY MOUTH ONCE DAILY  90 tablet  1  . ONGLYZA 2.5 MG TABS tablet TAKE ONE TABLET BY MOUTH ONCE DAILY  90 tablet  0  . pioglitazone (ACTOS) 45 MG tablet TAKE ONE TABLET BY MOUTH ONCE DAILY  90 tablet  0  . timolol (TIMOPTIC) 0.5 % ophthalmic solution 1 drop 2 (two) times daily.      . travoprost, benzalkonium, (TRAVATAN) 0.004 % ophthalmic solution 1 drop at bedtime.      Marland Kitchen acyclovir (ZOVIRAX) 400 MG tablet Take 400 mg by mouth daily.       Marland Kitchen BENICAR 20 MG tablet TAKE ONE  TABLET BY MOUTH ONCE DAILY  90 tablet  1  . Blood Glucose Monitoring Suppl (ACCU-CHEK AVIVA) kit by Other route. Use as instructed       . clobetasol cream (TEMOVATE) 5.32 % Apply 1 application topically 2 (two) times daily.      Marland Kitchen glimepiride (AMARYL) 2 MG tablet Take 2 mg by mouth daily with breakfast.      . glucose blood (ACCU-CHEK AVIVA) test strip 1 each by Other route 2 (two) times daily.  100 each  3  . Lancets (ACCU-CHEK MULTICLIX) lancets 1 each by Other route daily. Use as instructed       . latanoprost (XALATAN) 0.005 % ophthalmic solution 1 drop at bedtime.      . niacin (NIASPAN) 1000 MG CR tablet Take 1,000 mg by mouth at bedtime.        . pioglitazone (ACTOS) 45 MG tablet TAKE ONE TABLET BY MOUTH ONCE DAILY  30 tablet  0   No current facility-administered medications for this encounter.    REVIEW OF SYSTEMS:  A 15 point review of systems is documented in the electronic medical record. This was obtained by the nursing staff. However, I reviewed this with the patient to discuss relevant findings and make appropriate changes.  A comprehensive review of systems was negative.Marland Kitchen    PHYSICAL EXAM: This patient is in no acute distress.  He is alert and oriented.   height is '5\' 8"'  (1.727 m) and weight is 221 lb (100.245 kg). His blood pressure is 111/72 and his pulse is 79. His respiration is 16 and oxygen saturation is 100%.  He exhibits no respiratory distress or labored breathing.  He appears neurologically intact.  His mood is pleasant.  His affect is appropriate.   KPS = 100  100 - Normal; no complaints; no evidence of disease. 90   - Able to carry on normal activity; minor signs or symptoms of disease. 80   - Normal activity with effort; some signs or symptoms of disease. 25   - Cares for self; unable to carry on normal activity or to do active work. 60   - Requires occasional assistance, but is able to care for most of his personal needs. 50   - Requires considerable assistance  and frequent medical care. 29   - Disabled; requires special care and assistance. 59   - Severely disabled; hospital admission is indicated although death not imminent. 54   - Very sick; hospital admission necessary; active supportive treatment necessary. 10   - Moribund; fatal processes progressing rapidly. 0     - Dead  Karnofsky DA, Abelmann Beaver, Craver LS and Burchenal [facility] [phone]) The use of the nitrogen mustards in the palliative treatment of carcinoma: with particular reference to bronchogenic carcinoma Cancer 1 634-56   LABORATORY DATA:  Lab Results  Component Value Date   WBC 4.8 09/19/2013   HGB 13.2 09/19/2013   HCT 38.6* 09/19/2013   MCV 98.2 09/19/2013   PLT 207.0 09/19/2013   Lab Results  Component Value Date   NA 137 09/19/2013   K 4.7 09/19/2013  CL 104 09/19/2013   CO2 27 09/19/2013   Lab Results  Component Value Date   ALT 17 09/19/2013   AST 18 09/19/2013   ALKPHOS 57 09/19/2013   BILITOT 1.1 09/19/2013     RADIOGRAPHY: No results found.    IMPRESSION: This gentleman is a 73 y.o. 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 At this point, prostatic fossa radiotherapy would be associated with some chance for cure.   PLAN:.Today, I talked to the patient and family about the findings and work-up thus far.  We discussed the natural history of disease and general treatment, highlighting the role or radiotherapy in the management.  We discussed the available radiation techniques, and focused on the details of logistics and delivery.  We reviewed the anticipated acute and late sequelae associated with radiation in this setting.  The patient was encouraged to ask questions that I answered to the best of my ability.  The patient would like to proceed with radiation and will be scheduled for CT simulation on 10/16.  I spent 30 minutes  minutes face to face with the patient and more than 50% of that time was spent in counseling and/or coordination of care.   ------------------------------------------------  Sheral Apley. Tammi Klippel, M.D.

## 2013-10-08 ENCOUNTER — Encounter: Payer: Self-pay | Admitting: Radiation Oncology

## 2013-10-08 NOTE — Progress Notes (Signed)
GU Location of Tumor / Histology: adenocarcinoma prostate   If Prostate Cancer, Gleason Score is (3 + 4=7) and PSA is (0.51) on 9/2//2015   Patient presented 04/16/2012 with a rising PSA following 2007 radical prostatectomy  See pathology report below:  Past/Anticipated interventions by urology, if any: s/p non nerve-sparing RLRP on 07/2005 followed by years of surveillance   Past/Anticipated interventions by medical oncology, if any: None   Weight changes, if any: Weight stable   Bowel/Bladder complaints, if any: manageable stress incontinence using 1-2 pads per day, reported nocturia x5 on 04/08/2013 as well as occasional difficulty emptying his bladder.    Nausea/Vomiting, if any: None noted   Pain issues, if any: Denies bone pain   SAFETY ISSUES:  Prior radiation? NO  Pacemaker/ICD? NO  Possible current pregnancy? N/A  Is the patient on methotrexate? N/A  Current Complaints / other details: 73 year old male. Biochemical recurrent prostate cancer. Consulted originally by Dr. Tammi Klippel on 04/08/2013. Patient opted to proceed with additional PSA surveillance for a few months due to travel plan with intentions of beginning radiotherapy in Fall 2015. Referred back by Dr. Alinda Money to begin salvage radiation therapy as PSA continues to rise. Patient expressed he would like to start radiation therapy sometime after October 20.

## 2013-10-09 ENCOUNTER — Ambulatory Visit
Admission: RE | Admit: 2013-10-09 | Discharge: 2013-10-09 | Disposition: A | Discharge status: Home / Self Care | Payer: Medicare Other | Source: Ambulatory Visit | Attending: Radiation Oncology | Admitting: Radiation Oncology

## 2013-10-09 ENCOUNTER — Encounter: Payer: Self-pay | Admitting: Radiation Oncology

## 2013-10-09 VITALS — BP 111/72 | HR 79 | Resp 16 | Ht 68.0 in | Wt 221.0 lb

## 2013-10-09 DIAGNOSIS — Z860100 Personal history of colon polyps, unspecified: Secondary | ICD-10-CM | POA: Insufficient documentation

## 2013-10-09 DIAGNOSIS — E119 Type 2 diabetes mellitus without complications: Secondary | ICD-10-CM | POA: Diagnosis not present

## 2013-10-09 DIAGNOSIS — C61 Malignant neoplasm of prostate: Secondary | ICD-10-CM

## 2013-10-09 DIAGNOSIS — Z9852 Vasectomy status: Secondary | ICD-10-CM | POA: Diagnosis not present

## 2013-10-09 DIAGNOSIS — I10 Essential (primary) hypertension: Secondary | ICD-10-CM | POA: Insufficient documentation

## 2013-10-09 DIAGNOSIS — Z510 Encounter for antineoplastic radiation therapy: Secondary | ICD-10-CM | POA: Diagnosis present

## 2013-10-09 DIAGNOSIS — N529 Male erectile dysfunction, unspecified: Secondary | ICD-10-CM | POA: Insufficient documentation

## 2013-10-09 DIAGNOSIS — E785 Hyperlipidemia, unspecified: Secondary | ICD-10-CM | POA: Diagnosis not present

## 2013-10-09 DIAGNOSIS — Z87891 Personal history of nicotine dependence: Secondary | ICD-10-CM | POA: Diagnosis not present

## 2013-10-09 DIAGNOSIS — Z9079 Acquired absence of other genital organ(s): Secondary | ICD-10-CM | POA: Diagnosis not present

## 2013-10-09 DIAGNOSIS — H409 Unspecified glaucoma: Secondary | ICD-10-CM | POA: Insufficient documentation

## 2013-10-09 NOTE — Progress Notes (Signed)
Denies dysuria or hematuria. Reports a weak urine stream since prostatectomy. Reports nocturia x 2. Manages stress incontinence using 1-2 pads per day. Weight and vitals stable.

## 2013-10-09 NOTE — Progress Notes (Signed)
See progress note under physician encounter. 

## 2013-10-14 ENCOUNTER — Other Ambulatory Visit: Payer: Self-pay | Admitting: Internal Medicine

## 2013-10-15 ENCOUNTER — Other Ambulatory Visit: Payer: Self-pay | Admitting: *Deleted

## 2013-10-15 MED ORDER — PIOGLITAZONE HCL 45 MG PO TABS: ORAL_TABLET | ORAL | Status: DC

## 2013-10-22 ENCOUNTER — Other Ambulatory Visit: Payer: Self-pay | Admitting: Internal Medicine

## 2013-11-08 ENCOUNTER — Ambulatory Visit
Admission: RE | Admit: 2013-11-08 | Discharge: 2013-11-08 | Disposition: A | Discharge status: Home / Self Care | Payer: Medicare Other | Source: Ambulatory Visit | Attending: Radiation Oncology | Admitting: Radiation Oncology

## 2013-11-08 ENCOUNTER — Encounter: Payer: Self-pay | Admitting: Radiation Oncology

## 2013-11-08 DIAGNOSIS — C61 Malignant neoplasm of prostate: Secondary | ICD-10-CM | POA: Diagnosis not present

## 2013-11-08 NOTE — Progress Notes (Addendum)
  Radiation Oncology         (336) [phone] ________________________________  Name: Rodney [name] MRN: [id number]  Date: 11/08/2013  DOB: 07-19-1940  SIMULATION AND TREATMENT PLANNING NOTE    ICD-9-CM ICD-10-CM  1. Prostate cancer 185 C61    DIAGNOSIS:  73 y.o. 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  Identity was confirmed.  All relevant records and images related to the planned course of therapy were reviewed.  The patient freely provided informed written consent to proceed with treatment after reviewing the details related to the planned course of therapy. The consent form was witnessed and verified by the simulation staff.  Then, the patient was set-up in a stable reproducible supine position for radiation therapy.  A vacuum lock pillow device was custom fabricated to position his legs in a reproducible immobilized position.  Then, I performed a urethrogram under sterile conditions to identify the prostatic apex.  CT images were obtained.  Surface markings were placed.  The CT images were loaded into the planning software.  Then the prostate target and avoidance structures including the rectum, bladder, bowel and hips were contoured.  Treatment planning then occurred.  The radiation prescription was entered and confirmed.  A total of one complex treatment device was fabricated. I have requested : Intensity Modulated Radiotherapy (IMRT) is medically necessary for this case for the following reason:  Rectal sparing.Marland Kitchen  PLAN:  The patient will receive 68.4 Gy in 38 fractions.  ________________________________  Sheral Apley Tammi Klippel, M.D.

## 2013-11-09 ENCOUNTER — Encounter: Payer: Self-pay | Admitting: Radiation Oncology

## 2013-11-11 NOTE — Addendum Note (Signed)
Encounter addended by: Lora Paula, MD on: 11/11/2013  3:40 PM<BR>     Documentation filed: Notes Section, Visit Diagnoses

## 2013-11-18 DIAGNOSIS — C61 Malignant neoplasm of prostate: Secondary | ICD-10-CM | POA: Diagnosis not present

## 2013-11-19 ENCOUNTER — Ambulatory Visit
Admission: RE | Admit: 2013-11-19 | Discharge: 2013-11-19 | Disposition: A | Discharge status: Home / Self Care | Payer: Medicare Other | Source: Ambulatory Visit | Attending: Radiation Oncology | Admitting: Radiation Oncology

## 2013-11-19 DIAGNOSIS — C61 Malignant neoplasm of prostate: Secondary | ICD-10-CM | POA: Diagnosis not present

## 2013-11-20 ENCOUNTER — Ambulatory Visit
Admission: RE | Admit: 2013-11-20 | Discharge: 2013-11-20 | Disposition: A | Discharge status: Home / Self Care | Payer: Medicare Other | Source: Ambulatory Visit | Attending: Radiation Oncology | Admitting: Radiation Oncology

## 2013-11-20 DIAGNOSIS — C61 Malignant neoplasm of prostate: Secondary | ICD-10-CM | POA: Diagnosis not present

## 2013-11-21 ENCOUNTER — Ambulatory Visit
Admission: RE | Admit: 2013-11-21 | Discharge: 2013-11-21 | Disposition: A | Discharge status: Home / Self Care | Payer: Medicare Other | Source: Ambulatory Visit | Attending: Radiation Oncology | Admitting: Radiation Oncology

## 2013-11-21 DIAGNOSIS — C61 Malignant neoplasm of prostate: Secondary | ICD-10-CM | POA: Diagnosis not present

## 2013-11-22 ENCOUNTER — Encounter: Payer: Self-pay | Admitting: Radiation Oncology

## 2013-11-22 ENCOUNTER — Ambulatory Visit
Admission: RE | Admit: 2013-11-22 | Discharge: 2013-11-22 | Disposition: A | Discharge status: Home / Self Care | Payer: Medicare Other | Source: Ambulatory Visit | Attending: Radiation Oncology | Admitting: Radiation Oncology

## 2013-11-22 DIAGNOSIS — C61 Malignant neoplasm of prostate: Secondary | ICD-10-CM | POA: Diagnosis not present

## 2013-11-25 ENCOUNTER — Ambulatory Visit
Admission: RE | Admit: 2013-11-25 | Discharge: 2013-11-25 | Disposition: A | Discharge status: Home / Self Care | Payer: Medicare Other | Source: Ambulatory Visit | Attending: Radiation Oncology | Admitting: Radiation Oncology

## 2013-11-25 ENCOUNTER — Encounter: Payer: Self-pay | Admitting: Radiation Oncology

## 2013-11-25 VITALS — BP 134/77 | HR 75 | Temp 97.3°F | Resp 16 | Wt 219.0 lb

## 2013-11-25 DIAGNOSIS — C61 Malignant neoplasm of prostate: Secondary | ICD-10-CM

## 2013-11-25 NOTE — Progress Notes (Signed)
He is currently in no pain.  Reports urinary frequency.  Pt states they urinate 1 - 2 times per night.  Pt reports, a formed bowel movement every day. The patient eats a regular, healthy diet.  Pt had cataract surgery today on right eye and has to wear an eye shield until tomorrow. Reports he will be having surgery on his left eye in a few weeks.

## 2013-11-25 NOTE — Progress Notes (Signed)
   Department of Radiation Oncology  Phone:  [phone] Fax:        [phone]  Weekly Treatment Note    Name: Rodney MARTINE Date: 11/25/2013 MRN: [id number] DOB: [birth year]   Current dose: 9 Gy  Current fraction:5   MEDICATIONS: Current Outpatient Prescriptions  Medication Sig Dispense Refill  . acyclovir (ZOVIRAX) 400 MG tablet Take 400 mg by mouth daily.     Marland Kitchen atorvastatin (LIPITOR) 20 MG tablet Take 1 tablet (20 mg total) by mouth daily. 90 tablet 1  . BENICAR 20 MG tablet TAKE ONE TABLET BY MOUTH ONCE DAILY 90 tablet 1  . BENICAR 20 MG tablet TAKE ONE TABLET BY MOUTH ONCE DAILY 90 tablet 1  . Blood Glucose Monitoring Suppl (ACCU-CHEK AVIVA) kit by Other route. Use as instructed     . clobetasol cream (TEMOVATE) 0.34 % Apply 1 application topically 2 (two) times daily.    Marland Kitchen glimepiride (AMARYL) 2 MG tablet Take 2 mg by mouth daily with breakfast.    . glucose blood (ACCU-CHEK AVIVA) test strip 1 each by Other route 2 (two) times daily. 100 each 3  . Lancets (ACCU-CHEK MULTICLIX) lancets 1 each by Other route daily. Use as instructed     . latanoprost (XALATAN) 0.005 % ophthalmic solution 1 drop at bedtime.    . niacin (NIASPAN) 1000 MG CR tablet Take 1,000 mg by mouth at bedtime.      . ONGLYZA 2.5 MG TABS tablet TAKE ONE TABLET BY MOUTH ONCE DAILY 90 tablet 0  . pioglitazone (ACTOS) 45 MG tablet TAKE ONE TABLET BY MOUTH ONCE DAILY. NEED A  FOLLOW UP APPOINTMENT 90 tablet 0  . timolol (TIMOPTIC) 0.5 % ophthalmic solution 1 drop 2 (two) times daily.    . travoprost, benzalkonium, (TRAVATAN) 0.004 % ophthalmic solution 1 drop at bedtime.     No current facility-administered medications for this encounter.     ALLERGIES: Ace inhibitors   LABORATORY DATA:  Lab Results  Component Value Date   WBC 4.8 09/19/2013   HGB 13.2 09/19/2013   HCT 38.6* 09/19/2013   MCV 98.2 09/19/2013   PLT 207.0 09/19/2013   Lab Results  Component Value Date   NA 137 09/19/2013     K 4.7 09/19/2013   CL 104 09/19/2013   CO2 27 09/19/2013   Lab Results  Component Value Date   ALT 17 09/19/2013   AST 18 09/19/2013   ALKPHOS 57 09/19/2013   BILITOT 1.1 09/19/2013     NARRATIVE: Rodney [name] was seen today for weekly treatment management. The chart was checked and the patient's films were reviewed.  The patient is doing very well after his first week of treatment. Nocturia one to 2 times per night. No complaints or difficulties.  PHYSICAL EXAMINATION: weight is 219 lb (99.338 kg). His oral temperature is 97.3 F (36.3 C). His blood pressure is 134/77 and his pulse is 75. His respiration is 16 and oxygen saturation is 100%.        ASSESSMENT: The patient is doing satisfactorily with treatment.  PLAN: We will continue with the patient's radiation treatment as planned.

## 2013-11-26 ENCOUNTER — Ambulatory Visit
Admission: RE | Admit: 2013-11-26 | Discharge: 2013-11-26 | Disposition: A | Discharge status: Home / Self Care | Payer: Medicare Other | Source: Ambulatory Visit | Attending: Radiation Oncology | Admitting: Radiation Oncology

## 2013-11-26 DIAGNOSIS — C61 Malignant neoplasm of prostate: Secondary | ICD-10-CM | POA: Diagnosis not present

## 2013-11-27 ENCOUNTER — Ambulatory Visit
Admission: RE | Admit: 2013-11-27 | Discharge: 2013-11-27 | Disposition: A | Discharge status: Home / Self Care | Payer: Medicare Other | Source: Ambulatory Visit | Attending: Radiation Oncology | Admitting: Radiation Oncology

## 2013-11-27 DIAGNOSIS — C61 Malignant neoplasm of prostate: Secondary | ICD-10-CM | POA: Diagnosis not present

## 2013-11-28 ENCOUNTER — Ambulatory Visit
Admission: RE | Admit: 2013-11-28 | Discharge: 2013-11-28 | Disposition: A | Discharge status: Home / Self Care | Payer: Medicare Other | Source: Ambulatory Visit | Attending: Radiation Oncology | Admitting: Radiation Oncology

## 2013-11-28 ENCOUNTER — Encounter: Payer: Self-pay | Admitting: Radiation Oncology

## 2013-11-28 VITALS — BP 129/76 | HR 73 | Temp 97.5°F | Resp 12 | Wt 218.5 lb

## 2013-11-28 DIAGNOSIS — C61 Malignant neoplasm of prostate: Secondary | ICD-10-CM

## 2013-11-28 NOTE — Progress Notes (Signed)
He is currently in no pain. Pt complains of, Fatigue.  Reports urinary frequency Dribbling and Pain with Urination.  Pt states they urinate 1 - 2 times per night.  Pt reports occasional Constipation, a bowel movement every day. Started eating Activia Yogurt today to see if it will help.  Encouraged drinking plenty of water, plums/prunes. The patient eats a regular, healthy diet.

## 2013-11-28 NOTE — Progress Notes (Signed)
Department of Radiation Oncology  Phone:  [phone] Fax:        [phone]  Weekly Treatment Note    Name: Rodney [name] Date: 11/28/2013 MRN: [id number]  DOB: 02/21/1940    ICD-9-CM ICD-10-CM   1. Prostate cancer 185 C61      Current dose: 14.4 Gy  MEDICATIONS: Current Outpatient Prescriptions  Medication Sig Dispense Refill  . acyclovir (ZOVIRAX) 400 MG tablet Take 400 mg by mouth daily.     Marland Kitchen atorvastatin (LIPITOR) 20 MG tablet Take 1 tablet (20 mg total) by mouth daily. 90 tablet 1  . BENICAR 20 MG tablet TAKE ONE TABLET BY MOUTH ONCE DAILY 90 tablet 1  . BENICAR 20 MG tablet TAKE ONE TABLET BY MOUTH ONCE DAILY 90 tablet 1  . Blood Glucose Monitoring Suppl (ACCU-CHEK AVIVA) kit by Other route. Use as instructed     . clobetasol cream (TEMOVATE) 6.55 % Apply 1 application topically 2 (two) times daily.    Marland Kitchen glimepiride (AMARYL) 2 MG tablet Take 2 mg by mouth daily with breakfast.    . glucose blood (ACCU-CHEK AVIVA) test strip 1 each by Other route 2 (two) times daily. 100 each 3  . Lancets (ACCU-CHEK MULTICLIX) lancets 1 each by Other route daily. Use as instructed     . latanoprost (XALATAN) 0.005 % ophthalmic solution 1 drop at bedtime.    . niacin (NIASPAN) 1000 MG CR tablet Take 1,000 mg by mouth at bedtime.      . ONGLYZA 2.5 MG TABS tablet TAKE ONE TABLET BY MOUTH ONCE DAILY 90 tablet 0  . pioglitazone (ACTOS) 45 MG tablet TAKE ONE TABLET BY MOUTH ONCE DAILY. NEED A  FOLLOW UP APPOINTMENT 90 tablet 0  . timolol (TIMOPTIC) 0.5 % ophthalmic solution 1 drop 2 (two) times daily.    . travoprost, benzalkonium, (TRAVATAN) 0.004 % ophthalmic solution 1 drop at bedtime.     No current facility-administered medications for this encounter.     ALLERGIES: Ace inhibitors   LABORATORY DATA:  Lab Results  Component Value Date   WBC 4.8 09/19/2013   HGB 13.2 09/19/2013   HCT 38.6* 09/19/2013   MCV 98.2 09/19/2013   PLT 207.0 09/19/2013   Lab Results    Component Value Date   NA 137 09/19/2013   K 4.7 09/19/2013   CL 104 09/19/2013   CO2 27 09/19/2013   Lab Results  Component Value Date   ALT 17 09/19/2013   AST 18 09/19/2013   ALKPHOS 57 09/19/2013   BILITOT 1.1 09/19/2013     NARRATIVE: Rodney [name] was seen today for weekly treatment management. The chart was checked and the patient's films were reviewed. He is currently in no pain. Pt complains of, Fatigue.  Reports urinary frequency Dribbling and Pain with Urination.  Pt states they urinate 1 - 2 times per night.  Pt reports occasional Constipation, a bowel movement every day. Started eating Activia Yogurt today to see if it will help.  Encouraged drinking plenty of water, plums/prunes. The patient eats a regular, healthy diet.    PHYSICAL EXAMINATION: weight is 218 lb 8 oz (99.111 kg). His oral temperature is 97.5 F (36.4 C). His blood pressure is 129/76 and his pulse is 73. His respiration is 12 and oxygen saturation is 99%.        ASSESSMENT: The patient is doing satisfactorily with treatment.  PLAN: We will continue with the patient's radiation treatment as planned.     ------------------------------------------------  Rodney [name] Rodney [name], M.D.

## 2013-11-29 ENCOUNTER — Ambulatory Visit
Admission: RE | Admit: 2013-11-29 | Discharge: 2013-11-29 | Disposition: A | Discharge status: Home / Self Care | Payer: Medicare Other | Source: Ambulatory Visit | Attending: Radiation Oncology | Admitting: Radiation Oncology

## 2013-11-29 DIAGNOSIS — C61 Malignant neoplasm of prostate: Secondary | ICD-10-CM | POA: Diagnosis not present

## 2013-12-02 ENCOUNTER — Ambulatory Visit
Admission: RE | Admit: 2013-12-02 | Discharge: 2013-12-02 | Disposition: A | Discharge status: Home / Self Care | Payer: Medicare Other | Source: Ambulatory Visit | Attending: Radiation Oncology | Admitting: Radiation Oncology

## 2013-12-02 DIAGNOSIS — C61 Malignant neoplasm of prostate: Secondary | ICD-10-CM | POA: Diagnosis not present

## 2013-12-03 ENCOUNTER — Ambulatory Visit
Admission: RE | Admit: 2013-12-03 | Discharge: 2013-12-03 | Disposition: A | Discharge status: Home / Self Care | Payer: Medicare Other | Source: Ambulatory Visit | Attending: Radiation Oncology | Admitting: Radiation Oncology

## 2013-12-03 DIAGNOSIS — C61 Malignant neoplasm of prostate: Secondary | ICD-10-CM | POA: Diagnosis not present

## 2013-12-04 ENCOUNTER — Ambulatory Visit
Admission: RE | Admit: 2013-12-04 | Discharge: 2013-12-04 | Disposition: A | Discharge status: Home / Self Care | Payer: Medicare Other | Source: Ambulatory Visit | Attending: Radiation Oncology | Admitting: Radiation Oncology

## 2013-12-04 DIAGNOSIS — C61 Malignant neoplasm of prostate: Secondary | ICD-10-CM | POA: Diagnosis not present

## 2013-12-05 ENCOUNTER — Ambulatory Visit
Admission: RE | Admit: 2013-12-05 | Discharge: 2013-12-05 | Disposition: A | Discharge status: Home / Self Care | Payer: Medicare Other | Source: Ambulatory Visit | Attending: Radiation Oncology | Admitting: Radiation Oncology

## 2013-12-05 DIAGNOSIS — C61 Malignant neoplasm of prostate: Secondary | ICD-10-CM | POA: Diagnosis not present

## 2013-12-06 ENCOUNTER — Ambulatory Visit
Admission: RE | Admit: 2013-12-06 | Discharge: 2013-12-06 | Disposition: A | Discharge status: Home / Self Care | Payer: Medicare Other | Source: Ambulatory Visit | Attending: Radiation Oncology | Admitting: Radiation Oncology

## 2013-12-06 ENCOUNTER — Encounter: Payer: Self-pay | Admitting: Radiation Oncology

## 2013-12-06 VITALS — BP 125/79 | HR 84 | Resp 16 | Wt 224.3 lb

## 2013-12-06 DIAGNOSIS — C61 Malignant neoplasm of prostate: Secondary | ICD-10-CM | POA: Diagnosis not present

## 2013-12-06 NOTE — Progress Notes (Signed)
Reports increased frequency during the day time hours. Explains he voids every 30-45 minutes. Reports dysuria x1 week. Denies visible blood in urine but reports "kidney doctor noticed some last week while doing lab work." Reports constipation. Denies blood in stool. Reports nocturia has increased from 1 to 4. Describes a strong steady urine stream. Denies incontinence or difficulty emptying his bladder. Denies pain. Weight and vitals stable.

## 2013-12-07 ENCOUNTER — Encounter: Payer: Self-pay | Admitting: Radiation Oncology

## 2013-12-07 NOTE — Progress Notes (Signed)
  Radiation Oncology         [phone] ________________________________  Name: Rodney [name] MRN: [id number]  Date: 12/06/2013  DOB: 1940/05/13     Weekly Radiation Therapy Management    ICD-9-CM ICD-10-CM   1. Prostate cancer 185 C61     Current Dose: 25.2 Gy     Planned Dose:  68.4 Gy  Narrative . . . . . . . . The patient presents for routine under treatment assessment.                                   Reports increased frequency during the day time hours. Explains he voids every 30-45 minutes. Reports dysuria x1 week. Denies visible blood in urine but reports "kidney doctor noticed some last week while doing lab work." Reports constipation. Denies blood in stool. Reports nocturia has increased from 1 to 4. Describes a strong steady urine stream. Denies incontinence or difficulty emptying his bladder. Denies pain. Weight and vitals stable.                                  Set-up films were reviewed.                                 The chart was checked. Physical Findings. . .  weight is 224 lb 4.8 oz (101.742 kg). His blood pressure is 125/79 and his pulse is 84. His respiration is 16. . Weight essentially stable.  No significant changes. Impression . . . . . . . The patient is tolerating radiation. Plan . . . . . . . . . . . . Continue treatment as planned.  ________________________________  Sheral Apley. Tammi Klippel, M.D.

## 2013-12-09 ENCOUNTER — Ambulatory Visit
Admission: RE | Admit: 2013-12-09 | Discharge: 2013-12-09 | Disposition: A | Discharge status: Home / Self Care | Payer: Medicare Other | Source: Ambulatory Visit | Attending: Radiation Oncology | Admitting: Radiation Oncology

## 2013-12-09 DIAGNOSIS — C61 Malignant neoplasm of prostate: Secondary | ICD-10-CM | POA: Diagnosis not present

## 2013-12-10 ENCOUNTER — Ambulatory Visit
Admission: RE | Admit: 2013-12-10 | Discharge: 2013-12-10 | Disposition: A | Discharge status: Home / Self Care | Payer: Medicare Other | Source: Ambulatory Visit | Attending: Radiation Oncology | Admitting: Radiation Oncology

## 2013-12-10 DIAGNOSIS — C61 Malignant neoplasm of prostate: Secondary | ICD-10-CM | POA: Diagnosis not present

## 2013-12-11 ENCOUNTER — Ambulatory Visit
Admission: RE | Admit: 2013-12-11 | Discharge: 2013-12-11 | Disposition: A | Discharge status: Home / Self Care | Payer: Medicare Other | Source: Ambulatory Visit | Attending: Radiation Oncology | Admitting: Radiation Oncology

## 2013-12-11 DIAGNOSIS — C61 Malignant neoplasm of prostate: Secondary | ICD-10-CM | POA: Diagnosis not present

## 2013-12-12 ENCOUNTER — Ambulatory Visit
Admission: RE | Admit: 2013-12-12 | Discharge: 2013-12-12 | Disposition: A | Discharge status: Home / Self Care | Payer: Medicare Other | Source: Ambulatory Visit | Attending: Radiation Oncology | Admitting: Radiation Oncology

## 2013-12-12 DIAGNOSIS — C61 Malignant neoplasm of prostate: Secondary | ICD-10-CM | POA: Diagnosis not present

## 2013-12-13 ENCOUNTER — Ambulatory Visit
Admission: RE | Admit: 2013-12-13 | Discharge: 2013-12-13 | Disposition: A | Discharge status: Home / Self Care | Payer: Medicare Other | Source: Ambulatory Visit | Attending: Radiation Oncology | Admitting: Radiation Oncology

## 2013-12-13 ENCOUNTER — Encounter: Payer: Self-pay | Admitting: Radiation Oncology

## 2013-12-13 VITALS — BP 147/82 | HR 77 | Resp 16 | Wt 224.0 lb

## 2013-12-13 DIAGNOSIS — C61 Malignant neoplasm of prostate: Secondary | ICD-10-CM | POA: Diagnosis not present

## 2013-12-13 NOTE — Progress Notes (Signed)
  Radiation Oncology         [phone] ________________________________  Name: GARO HEIDELBERG MRN: [id number]  Date: 12/13/2013  DOB: 28-Dec-1940     Weekly Radiation Therapy Management    ICD-9-CM ICD-10-CM   1. Prostate cancer 185 C61     Current Dose: 36 Gy     Planned Dose:  68.4 Gy  Narrative . . . . . . . . The patient presents for routine under treatment assessment.                                   Reports hematuria. Denies blood in stool. Reports dysuria. Reports nocturia x 4. Reports a normal steady urine stream. Denies diarrhea. Denies pain. Weight and vitals stable. Denies leakage or incontinence.                                Set-up films were reviewed.                                 The chart was checked. Physical Findings. . .  weight is 224 lb (101.606 kg). His blood pressure is 147/82 and his pulse is 77. His respiration is 16. . Weight essentially stable.  No significant changes. Impression . . . . . . . The patient is tolerating radiation. Plan . . . . . . . . . . . . Continue treatment as planned.  ________________________________  Sheral Apley. Tammi Klippel, M.D.

## 2013-12-13 NOTE — Progress Notes (Signed)
Reports hematuria. Denies blood in stool. Reports dysuria. Reports nocturia x 4. Reports a normal steady urine stream. Denies diarrhea. Denies pain. Weight and vitals stable. Denies leakage or incontinence.

## 2013-12-15 ENCOUNTER — Ambulatory Visit
Admission: RE | Admit: 2013-12-15 | Discharge: 2013-12-15 | Disposition: A | Discharge status: Home / Self Care | Payer: Medicare Other | Source: Ambulatory Visit | Attending: Radiation Oncology | Admitting: Radiation Oncology

## 2013-12-15 DIAGNOSIS — C61 Malignant neoplasm of prostate: Secondary | ICD-10-CM | POA: Diagnosis not present

## 2013-12-16 ENCOUNTER — Ambulatory Visit
Admission: RE | Admit: 2013-12-16 | Discharge: 2013-12-16 | Disposition: A | Discharge status: Home / Self Care | Payer: Medicare Other | Source: Ambulatory Visit | Attending: Radiation Oncology | Admitting: Radiation Oncology

## 2013-12-16 DIAGNOSIS — C61 Malignant neoplasm of prostate: Secondary | ICD-10-CM | POA: Diagnosis not present

## 2013-12-17 ENCOUNTER — Ambulatory Visit
Admission: RE | Admit: 2013-12-17 | Discharge: 2013-12-17 | Disposition: A | Discharge status: Home / Self Care | Payer: Medicare Other | Source: Ambulatory Visit | Attending: Radiation Oncology | Admitting: Radiation Oncology

## 2013-12-17 DIAGNOSIS — C61 Malignant neoplasm of prostate: Secondary | ICD-10-CM | POA: Diagnosis not present

## 2013-12-18 ENCOUNTER — Ambulatory Visit
Admission: RE | Admit: 2013-12-18 | Discharge: 2013-12-18 | Disposition: A | Discharge status: Home / Self Care | Payer: Medicare Other | Source: Ambulatory Visit | Attending: Radiation Oncology | Admitting: Radiation Oncology

## 2013-12-18 ENCOUNTER — Telehealth: Payer: Self-pay | Admitting: Radiation Oncology

## 2013-12-18 ENCOUNTER — Encounter: Payer: Self-pay | Admitting: Radiation Oncology

## 2013-12-18 VITALS — BP 144/96 | HR 76 | Resp 16 | Wt 222.9 lb

## 2013-12-18 DIAGNOSIS — R358 Other polyuria: Secondary | ICD-10-CM

## 2013-12-18 DIAGNOSIS — R3589 Other polyuria: Secondary | ICD-10-CM

## 2013-12-18 DIAGNOSIS — C61 Malignant neoplasm of prostate: Secondary | ICD-10-CM

## 2013-12-18 DIAGNOSIS — R300 Dysuria: Secondary | ICD-10-CM

## 2013-12-18 LAB — URINALYSIS, MICROSCOPIC - CHCC
Bilirubin (Urine): NEGATIVE
Glucose: NEGATIVE mg/dL
Ketones: NEGATIVE mg/dL
Leukocyte Esterase: NEGATIVE
NITRITE: NEGATIVE
PROTEIN: NEGATIVE mg/dL
Specific Gravity, Urine: 1.005 (ref 1.003–1.035)
UROBILINOGEN UR: 0.2 mg/dL (ref 0.2–1)
WBC, UA: NEGATIVE (ref 0–2)
pH: 6.5 (ref 4.6–8.0)

## 2013-12-18 NOTE — Telephone Encounter (Signed)
Explained to the patient that UA did not show any infection per Dr. Tammi Klippel. Also, explained a UA would be repeated in two weeks. Patient verbalized understanding.

## 2013-12-18 NOTE — Progress Notes (Signed)
  Radiation Oncology         [phone] ________________________________  Name: Rodney [name] MRN: [id number]  Date: 12/18/2013  DOB: [birth year]     Weekly Radiation Therapy Management    ICD-9-CM ICD-10-CM   1. Prostate cancer 185 C61 Urinalysis, Microscopic - [facility]     Urine culture  2. Dysuria 788.1 R30.0 Urinalysis, Microscopic - [facility]     Urine culture  3. Polyuria 788.42 R35.8 Urinalysis, Microscopic - [facility]     Urine culture    Current Dose: 41.4 Gy     Planned Dose:  68.4 Gy  Narrative . . . . . . . . The patient presents for routine under treatment assessment.                                   Denies blood in stool. Denies diarrhea. Reports mild dysuria. Reports nocturia x 4. Reports a normal steady urine stream. Denies pain. Weight stable. BP slightly elevated. Denies leakage or incontinence. Reports mild fatigue. .                                  Set-up films were reviewed.                                 The chart was checked. Physical Findings. . .  weight is 222 lb 14.4 oz (101.107 kg). His blood pressure is 144/96 and his pulse is 76. His respiration is 16. . Weight essentially stable.  No significant changes. Impression . . . . . . . The patient is tolerating radiation. Plan . . . . . . . . . . . . Continue treatment as planned.  Urinalysis and culture.  ________________________________  Sheral Apley Tammi Klippel, M.D.

## 2013-12-18 NOTE — Progress Notes (Signed)
Denies blood in stool. Denies diarrhea. Reports mild dysuria. Reports nocturia x 4. Reports a normal steady urine stream. Denies pain. Weight stable. BP slightly elevated. Denies leakage or incontinence. Reports mild fatigue.

## 2013-12-19 LAB — URINE CULTURE

## 2013-12-20 ENCOUNTER — Ambulatory Visit: Payer: Medicare Other

## 2013-12-21 NOTE — Progress Notes (Signed)
Quick Note:  Please call patient with normal result.  Thanks. MM ______ 

## 2013-12-23 ENCOUNTER — Ambulatory Visit
Admission: RE | Admit: 2013-12-23 | Discharge: 2013-12-23 | Disposition: A | Discharge status: Home / Self Care | Payer: Medicare Other | Source: Ambulatory Visit | Attending: Radiation Oncology | Admitting: Radiation Oncology

## 2013-12-23 ENCOUNTER — Ambulatory Visit: Admission: RE | Admit: 2013-12-23 | Payer: Medicare Other | Source: Ambulatory Visit

## 2013-12-23 ENCOUNTER — Other Ambulatory Visit: Payer: Self-pay | Admitting: Radiation Oncology

## 2013-12-23 ENCOUNTER — Telehealth: Payer: Self-pay | Admitting: Radiation Oncology

## 2013-12-23 DIAGNOSIS — R319 Hematuria, unspecified: Secondary | ICD-10-CM

## 2013-12-23 DIAGNOSIS — C61 Malignant neoplasm of prostate: Secondary | ICD-10-CM | POA: Diagnosis not present

## 2013-12-23 DIAGNOSIS — R300 Dysuria: Secondary | ICD-10-CM

## 2013-12-23 LAB — URINALYSIS, MICROSCOPIC - CHCC
Bacteria, UA: NEGATIVE
Bilirubin (Urine): NEGATIVE
Glucose: NEGATIVE mg/dL
Ketones: NEGATIVE mg/dL
Leukocyte Esterase: NEGATIVE
Nitrite: NEGATIVE
PROTEIN: 30 mg/dL
SPECIFIC GRAVITY, URINE: 1.01 (ref 1.003–1.035)
UROBILINOGEN UR: 0.2 mg/dL (ref 0.2–1)
pH: 6.5 (ref 4.6–8.0)

## 2013-12-23 MED ORDER — PHENAZOPYRIDINE HCL 200 MG PO TABS: 200.0000 mg | ORAL_TABLET | Freq: Three times a day (TID) | ORAL | Status: DC | PRN

## 2013-12-23 NOTE — Telephone Encounter (Signed)
Phoned patient making him aware script for pyridium has been called to his [place]. Also, explained a sterile urine collection cup is waiting for him on the TOMO machine with Heather, RT. Requested he void in the cup following treatment. Patient verbalized understanding.

## 2013-12-23 NOTE — Telephone Encounter (Signed)
Phoned patient per Dr. Johny Shears order to make his aware his urine culture is normal. Patient verbalized understanding. However, patient expressed dysuria is worse, urinary frequency is worse, and nocturia has increased to five. Explained this RN will inform Dr. Tammi Klippel of these finding and call him back with the recommendations.

## 2013-12-23 NOTE — Telephone Encounter (Signed)
Let's repeat UA today, and I'll ePrescribe pyridium

## 2013-12-24 ENCOUNTER — Telehealth: Payer: Self-pay | Admitting: Radiation Oncology

## 2013-12-24 ENCOUNTER — Ambulatory Visit
Admission: RE | Admit: 2013-12-24 | Discharge: 2013-12-24 | Disposition: A | Discharge status: Home / Self Care | Payer: Medicare Other | Source: Ambulatory Visit | Attending: Radiation Oncology | Admitting: Radiation Oncology

## 2013-12-24 DIAGNOSIS — R300 Dysuria: Secondary | ICD-10-CM

## 2013-12-24 DIAGNOSIS — C61 Malignant neoplasm of prostate: Secondary | ICD-10-CM

## 2013-12-24 LAB — URINE CULTURE

## 2013-12-24 NOTE — Telephone Encounter (Signed)
-----   Message from Lora Paula, MD sent at 12/24/2013  3:25 PM EST ----- Yes thanks MM  ----- Message -----    From: Heywood Footman, RN    Sent: 12/24/2013  10:41 AM      To: Lora Paula, MD  Dr. Tammi Klippel.  Medicare Part D will not cover the generic or brand name Pyridium. [facility] doesn't have an alternative. Would you like me to tell the patient to pick up AZO OTC and double the dose?  Sam ----- Message -----    From: Lora Paula, MD    Sent: 12/23/2013   6:16 PM      To: Heywood Footman, RN  Await culture.

## 2013-12-24 NOTE — Telephone Encounter (Signed)
Phoned patient at home. No answer. Patient presented to nursing following treatment. Explained his insurance wouldn't cover pyridium. Per Dr. Tammi Klippel advised patient to pick up AZO for dysuria. Patient verbalized understanding.

## 2013-12-24 NOTE — Telephone Encounter (Signed)
Phoned patient's home. Explained the pharmacy contacted this RN's office this morning because insurance would not cover his pyridium. Explained this RN is working with Dr. Tammi Klippel to find an alternative. Also, explained this RN is awaiting his urine culture results. Patient understands this writer will call back with these answers.

## 2013-12-25 ENCOUNTER — Ambulatory Visit
Admission: RE | Admit: 2013-12-25 | Discharge: 2013-12-25 | Disposition: A | Discharge status: Home / Self Care | Payer: Medicare Other | Source: Ambulatory Visit | Attending: Radiation Oncology | Admitting: Radiation Oncology

## 2013-12-25 ENCOUNTER — Telehealth: Payer: Self-pay | Admitting: Radiation Oncology

## 2013-12-25 DIAGNOSIS — C61 Malignant neoplasm of prostate: Secondary | ICD-10-CM | POA: Diagnosis not present

## 2013-12-25 NOTE — Telephone Encounter (Signed)
Phoned patient this morning to assess status and inform him of normal urine culture results. No answer. Left message.

## 2013-12-25 NOTE — Progress Notes (Signed)
Quick Note:  Please call patient with normal result.  Thanks. MM ______ 

## 2013-12-26 ENCOUNTER — Ambulatory Visit
Admission: RE | Admit: 2013-12-26 | Discharge: 2013-12-26 | Disposition: A | Discharge status: Home / Self Care | Payer: Medicare Other | Source: Ambulatory Visit | Attending: Radiation Oncology | Admitting: Radiation Oncology

## 2013-12-26 DIAGNOSIS — C61 Malignant neoplasm of prostate: Secondary | ICD-10-CM | POA: Diagnosis not present

## 2013-12-27 ENCOUNTER — Ambulatory Visit
Admission: RE | Admit: 2013-12-27 | Discharge: 2013-12-27 | Disposition: A | Discharge status: Home / Self Care | Payer: Medicare Other | Source: Ambulatory Visit | Attending: Radiation Oncology | Admitting: Radiation Oncology

## 2013-12-27 ENCOUNTER — Encounter: Payer: Self-pay | Admitting: Radiation Oncology

## 2013-12-27 VITALS — BP 119/77 | HR 81 | Resp 18 | Wt 227.9 lb

## 2013-12-27 DIAGNOSIS — C61 Malignant neoplasm of prostate: Secondary | ICD-10-CM | POA: Diagnosis not present

## 2013-12-27 MED ORDER — OXYBUTYNIN CHLORIDE ER 10 MG PO TB24: 10.0000 mg | ORAL_TABLET | Freq: Every day | ORAL | Status: DC

## 2013-12-27 NOTE — Progress Notes (Signed)
Weight and vitals stable. Denies pain. Reports dysuria continues despite using AZO. Reports frequency and urgency. Reports voiding every 15-20 minutes during the day and every hour at night. Reports passing only a small amount of urine each time he voids. Denies incontinence or leakage. Denies diarrhea or rectal irritation.

## 2013-12-27 NOTE — Progress Notes (Addendum)
  Radiation Oncology         [phone] ________________________________  Name: Rodney [name] MRN: [id number]  Date: 12/27/2013  DOB: [birth year]     Weekly Radiation Therapy Management    ICD-9-CM ICD-10-CM   1. Prostate cancer 185 C61     Current Dose: 50.4 Gy     Planned Dose:  68.4 Gy  Narrative . . . . . . . . The patient presents for routine under treatment assessment.                                  Weight and vitals stable. Denies pain. Reports dysuria continues despite using AZO. Reports frequency and urgency. Reports voiding every 15-20 minutes during the day and every hour at night. Reports passing only a small amount of urine each time he voids. Denies incontinence or leakage. Denies diarrhea or rectal irritation. .                                 The chart was checked. Physical Findings. . .  weight is 227 lb 14.4 oz (103.375 kg). His blood pressure is 119/77 and his pulse is 81. His respiration is 18. . Weight essentially stable.  No significant changes. Impression . . . . . . . The patient is tolerating radiation. Plan . . . . . . . . . . . . Continue treatment as planned.  Will try ditropan. 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  Ditropan is contraindicated for closed angle uncontrolled glaucoma, which he denies.  ________________________________  Sheral Apley Tammi Klippel, M.D.

## 2013-12-29 ENCOUNTER — Ambulatory Visit: Payer: Medicare Other

## 2013-12-30 ENCOUNTER — Ambulatory Visit
Admission: RE | Admit: 2013-12-30 | Discharge: 2013-12-30 | Disposition: A | Discharge status: Home / Self Care | Payer: Medicare Other | Source: Ambulatory Visit | Attending: Radiation Oncology | Admitting: Radiation Oncology

## 2013-12-30 DIAGNOSIS — C61 Malignant neoplasm of prostate: Secondary | ICD-10-CM | POA: Diagnosis not present

## 2013-12-31 ENCOUNTER — Ambulatory Visit
Admission: RE | Admit: 2013-12-31 | Discharge: 2013-12-31 | Disposition: A | Discharge status: Home / Self Care | Payer: Medicare Other | Source: Ambulatory Visit | Attending: Radiation Oncology | Admitting: Radiation Oncology

## 2013-12-31 DIAGNOSIS — C61 Malignant neoplasm of prostate: Secondary | ICD-10-CM | POA: Diagnosis not present

## 2014-01-01 ENCOUNTER — Ambulatory Visit
Admission: RE | Admit: 2014-01-01 | Discharge: 2014-01-01 | Disposition: A | Discharge status: Home / Self Care | Payer: Medicare Other | Source: Ambulatory Visit | Attending: Radiation Oncology | Admitting: Radiation Oncology

## 2014-01-01 DIAGNOSIS — C61 Malignant neoplasm of prostate: Secondary | ICD-10-CM | POA: Diagnosis not present

## 2014-01-02 ENCOUNTER — Ambulatory Visit
Admission: RE | Admit: 2014-01-02 | Discharge: 2014-01-02 | Disposition: A | Discharge status: Home / Self Care | Payer: Medicare Other | Source: Ambulatory Visit | Attending: Radiation Oncology | Admitting: Radiation Oncology

## 2014-01-02 ENCOUNTER — Encounter: Payer: Self-pay | Admitting: Radiation Oncology

## 2014-01-02 VITALS — BP 125/69 | HR 71 | Temp 97.4°F | Resp 12 | Wt 222.9 lb

## 2014-01-02 DIAGNOSIS — C61 Malignant neoplasm of prostate: Secondary | ICD-10-CM | POA: Diagnosis not present

## 2014-01-02 NOTE — Progress Notes (Signed)
He is currently in no pain.  Reports urinary frequency, Pain with Urination and Urgency.  Pt states they urinate 6-7 times per night.  Pt reports Constipation, reports having small hard bowel movements everyday, reports taking stool softeners, suggested prunes and increased fluid intake.

## 2014-01-02 NOTE — Progress Notes (Signed)
  Radiation Oncology         [phone] ________________________________  Name: Rodney [name] MRN: [id number]  Date: 01/02/2014  DOB: Oct 01, 1940     Weekly Radiation Therapy Management    ICD-9-CM ICD-10-CM   1. Prostate cancer 185 C61     Current Dose: 57.6Gy     Planned Dose:  68.4 Gy  Narrative . . . . . . . . The patient presents for routine under treatment assessment.                                  He is currently in no pain.  Reports urinary frequency, Pain with Urination and Urgency.  Pt states they urinate 6-7 times per night.  Pt reports Constipation, reports having small hard bowel movements everyday, reports taking stool softeners, suggested prunes and increased fluid intake. Ditropan helping some                                 The chart was checked. Physical Findings. . .  weight is 222 lb 14.4 oz (101.107 kg). His oral temperature is 97.4 F (36.3 C). His blood pressure is 125/69 and his pulse is 71. His respiration is 12 and oxygen saturation is 100%. . Weight essentially stable.  No significant changes. Impression . . . . . . . The patient is tolerating radiation. Plan . . . . . . . . . . . . Continue treatment as planned.    ________________________________  Sheral Apley. Tammi Klippel, M.D.

## 2014-01-03 ENCOUNTER — Ambulatory Visit
Admission: RE | Admit: 2014-01-03 | Discharge: 2014-01-03 | Disposition: A | Discharge status: Home / Self Care | Payer: Medicare Other | Source: Ambulatory Visit | Attending: Radiation Oncology | Admitting: Radiation Oncology

## 2014-01-03 DIAGNOSIS — C61 Malignant neoplasm of prostate: Secondary | ICD-10-CM | POA: Diagnosis not present

## 2014-01-06 ENCOUNTER — Ambulatory Visit
Admission: RE | Admit: 2014-01-06 | Discharge: 2014-01-06 | Disposition: A | Discharge status: Home / Self Care | Payer: Medicare Other | Source: Ambulatory Visit | Attending: Radiation Oncology | Admitting: Radiation Oncology

## 2014-01-06 DIAGNOSIS — C61 Malignant neoplasm of prostate: Secondary | ICD-10-CM | POA: Diagnosis not present

## 2014-01-07 ENCOUNTER — Ambulatory Visit
Admission: RE | Admit: 2014-01-07 | Discharge: 2014-01-07 | Disposition: A | Discharge status: Home / Self Care | Payer: Medicare Other | Source: Ambulatory Visit | Attending: Radiation Oncology | Admitting: Radiation Oncology

## 2014-01-07 DIAGNOSIS — C61 Malignant neoplasm of prostate: Secondary | ICD-10-CM | POA: Diagnosis not present

## 2014-01-08 ENCOUNTER — Ambulatory Visit
Admission: RE | Admit: 2014-01-08 | Discharge: 2014-01-08 | Disposition: A | Discharge status: Home / Self Care | Payer: Medicare Other | Source: Ambulatory Visit | Attending: Radiation Oncology | Admitting: Radiation Oncology

## 2014-01-08 DIAGNOSIS — C61 Malignant neoplasm of prostate: Secondary | ICD-10-CM | POA: Diagnosis not present

## 2014-01-09 ENCOUNTER — Encounter: Payer: Self-pay | Admitting: Radiation Oncology

## 2014-01-09 ENCOUNTER — Ambulatory Visit
Admission: RE | Admit: 2014-01-09 | Discharge: 2014-01-09 | Disposition: A | Discharge status: Home / Self Care | Payer: Medicare Other | Source: Ambulatory Visit | Attending: Radiation Oncology | Admitting: Radiation Oncology

## 2014-01-09 VITALS — BP 121/75 | HR 77 | Resp 16 | Wt 223.7 lb

## 2014-01-09 DIAGNOSIS — C61 Malignant neoplasm of prostate: Secondary | ICD-10-CM | POA: Diagnosis not present

## 2014-01-09 NOTE — Progress Notes (Signed)
  Radiation Oncology         [phone] ________________________________  Name: Rodney [name] MRN: [id number]  Date: 01/09/2014  DOB: 1940/07/23     Weekly Radiation Therapy Management    ICD-9-CM ICD-10-CM   1. Prostate cancer 185 C61     Current Dose: 57.6Gy     Planned Dose:  68.4 Gy  Narrative . . . . . . . . The patient presents for routine under treatment assessment.                                  Weight and vitals stable. Reports nocturia every hour. Reports dysuria. Denies visible hematuria. Denies diarrhea. Reports occasional urinary leakage but, reports small amounts.                                  The chart was checked. Physical Findings. . .  weight is 223 lb 11.2 oz (101.47 kg). His blood pressure is 121/75 and his pulse is 77. His respiration is 16. . Weight essentially stable.  No significant changes. Impression . . . . . . . The patient is tolerating radiation. Plan . . . . . . . . . . . . Continue treatment as planned.  Finish tomorrow  ________________________________  Sheral Apley Tammi Klippel, M.D.

## 2014-01-09 NOTE — Progress Notes (Signed)
Weight and vitals stable. Reports nocturia every hour. Reports dysuria. Denies visible hematuria. Denies diarrhea. Reports occasional urinary leakage but, reports small amounts.

## 2014-01-10 ENCOUNTER — Encounter: Payer: Self-pay | Admitting: Radiation Oncology

## 2014-01-10 ENCOUNTER — Ambulatory Visit
Admission: RE | Admit: 2014-01-10 | Discharge: 2014-01-10 | Disposition: A | Discharge status: Home / Self Care | Payer: Medicare Other | Source: Ambulatory Visit | Attending: Radiation Oncology | Admitting: Radiation Oncology

## 2014-01-10 DIAGNOSIS — C61 Malignant neoplasm of prostate: Secondary | ICD-10-CM | POA: Diagnosis not present

## 2014-01-11 ENCOUNTER — Other Ambulatory Visit: Payer: Self-pay | Admitting: Internal Medicine

## 2014-01-13 ENCOUNTER — Ambulatory Visit: Payer: Medicare Other

## 2014-01-13 NOTE — Progress Notes (Signed)
°  Radiation Oncology         [phone] ________________________________  Name: Rodney [name] MRN: [id number]  Date: 01/10/2014  DOB: Mar 03, 1940  End of Treatment Note      ICD-9-CM ICD-10-CM  1. Prostate cancer 185 C61    DIAGNOSIS: 73 y.o. 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 Image guidance was performed with megavoltage CT studies prior to each fraction. He was immobilized with a body fix lower extremity mold.  Narrative: The patient tolerated radiation treatment relatively well.   He experienced significant nocturia during radiation with rare leakage.  He was treated with Pyridium, Ditropan, and Advil for dysuria.  Plan: The patient has completed radiation treatment. The patient will return to radiation oncology clinic for routine followup in one month. I advised him to call or return sooner if he has any questions or concerns related to his recovery or treatment. ________________________________  Sheral Apley. Tammi Klippel, M.D.

## 2014-01-29 ENCOUNTER — Telehealth: Payer: Self-pay | Admitting: Radiation Oncology

## 2014-01-29 ENCOUNTER — Ambulatory Visit
Admission: RE | Admit: 2014-01-29 | Discharge: 2014-01-29 | Disposition: A | Discharge status: Home / Self Care | Payer: Medicare Other | Source: Ambulatory Visit | Attending: Radiation Oncology | Admitting: Radiation Oncology

## 2014-01-29 DIAGNOSIS — R300 Dysuria: Secondary | ICD-10-CM

## 2014-01-29 LAB — URINALYSIS, MICROSCOPIC - CHCC
Bilirubin (Urine): NEGATIVE
Glucose: NEGATIVE mg/dL
Ketones: NEGATIVE mg/dL
NITRITE: NEGATIVE
PH: 6 (ref 4.6–8.0)
PROTEIN: 30 mg/dL
SPECIFIC GRAVITY, URINE: 1.015 (ref 1.003–1.035)
UROBILINOGEN UR: 0.2 mg/dL (ref 0.2–1)

## 2014-01-29 NOTE — Telephone Encounter (Signed)
-----   Message from Lora Paula, MD sent at 01/29/2014  2:04 PM EST ----- Regarding: RE: Symptoms are Worse Contact: [phone] See if he can come in and give a urine sample today.  MM  ----- Message -----    From: Heywood Footman, RN    Sent: 01/28/2014   2:19 PM      To: Lora Paula, MD Subject: Symptoms are Worse                             Dr. Tammi Klippel.   I received a call from Mr. Brum. His prostate/post treatment symptoms are worse. He continues to complain of dysuria, voiding every thirty minutes, and intermittent shooting pain in his penis. His follow up appointment with [place] is scheduled for 02/06/2013 but, he wants to be seen sooner. He denies seeing hematuria in the last two day but, had it prior to that. Not scheduled to see urologist until March. I can stick him somewhere on your schedule this week unless you want me to refer this to his urologist.   Rodney [name]

## 2014-01-29 NOTE — Telephone Encounter (Signed)
Opened in error

## 2014-01-29 NOTE — Telephone Encounter (Signed)
Phoned patient at home. Urinary symptoms are no better. Requested patient present for UA and C&S. Patient reports he will be at [facility] by 3 pm. Lab seat entered for 3 pm. Orders enter for approval. Patient understands this nurse will call him with results.

## 2014-01-30 ENCOUNTER — Ambulatory Visit: Payer: Self-pay | Admitting: Family Medicine

## 2014-01-31 ENCOUNTER — Other Ambulatory Visit: Payer: Self-pay | Admitting: Radiation Oncology

## 2014-01-31 DIAGNOSIS — C61 Malignant neoplasm of prostate: Secondary | ICD-10-CM

## 2014-01-31 LAB — URINE CULTURE

## 2014-01-31 MED ORDER — IBUPROFEN 200 MG PO TABS: 400.0000 mg | ORAL_TABLET | Freq: Three times a day (TID) | ORAL | Status: DC

## 2014-02-06 ENCOUNTER — Encounter: Payer: Self-pay | Admitting: Radiation Oncology

## 2014-02-06 ENCOUNTER — Ambulatory Visit
Admission: RE | Admit: 2014-02-06 | Discharge: 2014-02-06 | Disposition: A | Discharge status: Home / Self Care | Payer: Medicare Other | Source: Ambulatory Visit | Attending: Radiation Oncology | Admitting: Radiation Oncology

## 2014-02-06 VITALS — BP 125/71 | HR 73 | Resp 16 | Wt 225.7 lb

## 2014-02-06 DIAGNOSIS — C61 Malignant neoplasm of prostate: Secondary | ICD-10-CM

## 2014-02-06 NOTE — Progress Notes (Signed)
Radiation Oncology         [phone] ________________________________  Name: Rodney [name] MRN: [id number]  Date: 02/06/2014  DOB: January 31, 1940  Follow-Up Visit Note  CC: Rodney Cowden, MD  Rodney Bring, MD  Diagnosis:   74 y.o. 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 Prostate cancer 185 C61     Interval Since Last Radiation:  2  weeks  Narrative:  The patient returns today for routine follow-up.  Patient reports dysuria. Reports hematuria resolved one week ago. Reports voiding every thirty minutes during the day and every hour and a half at night. Reports intermittent shooting pain in his penis during the day only. Denies diarrhea or constipation. Weight and vitals stable. Reports ditropan and pyridium haven't helped symptoms but, advil has a somewhat.                            ALLERGIES:  is allergic to ace inhibitors.  Meds: Current Outpatient Prescriptions  Medication Sig Dispense Refill  . acyclovir (ZOVIRAX) 400 MG tablet Take 400 mg by mouth daily.     Marland Kitchen atorvastatin (LIPITOR) 20 MG tablet Take 1 tablet (20 mg total) by mouth daily. 90 tablet 1  . BENICAR 20 MG tablet TAKE ONE TABLET BY MOUTH ONCE DAILY 90 tablet 1  . BESIVANCE 0.6 % SUSP     . Blood Glucose Monitoring Suppl (ACCU-CHEK AVIVA) kit by Other route. Use as instructed     . clobetasol cream (TEMOVATE) 4.50 % Apply 1 application topically 2 (two) times daily.    . dorzolamide-timolol (COSOPT) 22.3-6.8 MG/ML ophthalmic solution     . glimepiride (AMARYL) 2 MG tablet Take 2 mg by mouth daily with breakfast.    . glucose blood (ACCU-CHEK AVIVA) test strip 1 each by Other route 2 (two) times daily. 100 each 3  . ibuprofen (ADVIL,MOTRIN) 200 MG tablet Take 2 tablets (400 mg total) by mouth 3 (three) times daily. 60 tablet 2  . Lancets (ACCU-CHEK MULTICLIX) lancets 1 each by Other route  daily. Use as instructed     . latanoprost (XALATAN) 0.005 % ophthalmic solution 1 drop at bedtime.    . ONGLYZA 2.5 MG TABS tablet TAKE ONE TABLET BY MOUTH ONCE DAILY 90 tablet 0  . pioglitazone (ACTOS) 45 MG tablet TAKE ONE TABLET BY MOUTH ONCE DAILY. NEED A  FOLLOW UP APPOINTMENT 90 tablet 0  . timolol (TIMOPTIC) 0.5 % ophthalmic solution 1 drop 2 (two) times daily.    . travoprost, benzalkonium, (TRAVATAN) 0.004 % ophthalmic solution 1 drop at bedtime.    . niacin (NIASPAN) 1000 MG CR tablet Take 1,000 mg by mouth at bedtime.      Marland Kitchen oxybutynin (DITROPAN-XL) 10 MG 24 hr tablet Take 1 tablet (10 mg total) by mouth at bedtime. (Patient not taking: Reported on 02/06/2014) 30 tablet 0  . phenazopyridine (PYRIDIUM) 200 MG tablet Take 1 tablet (200 mg total) by mouth 3 (three) times daily as needed for pain. (Patient not taking: Reported on 02/06/2014) 45 tablet 5   No current facility-administered medications for this encounter.    Physical Findings: The patient is in no acute distress. Patient is alert and oriented.  weight is 225 lb 11.2 oz (102.377 kg). His blood pressure is 125/71 and his pulse is 73. His respiration is 16. Marland Kitchen  No significant changes.  Lab Findings: Lab Results  Component Value Date   WBC 4.8 09/19/2013   HGB 13.2 09/19/2013   HCT 38.6* 09/19/2013   MCV 98.2 09/19/2013   PLT 207.0 09/19/2013    '@LASTCHEM' @  Radiographic Findings: No results found.  Impression:  The patient is recovering from the effects of radiation.  Plan:  Follow-up in 2 weeks by phone.  _____________________________________  Sheral Apley. Tammi Klippel, M.D.

## 2014-02-06 NOTE — Progress Notes (Signed)
Patient reports dysuria. Reports hematuria resolved one week ago. Reports voiding every thirty minutes during the day and every hour and a half at night. Reports intermittent shooting pain in his penis during the day only. Denies diarrhea or constipation. Weight and vitals stable. Reports ditropan and pyridium haven't helped symptoms but, advil has a somewhat.

## 2014-02-10 ENCOUNTER — Other Ambulatory Visit: Payer: Self-pay | Admitting: Internal Medicine

## 2014-02-17 ENCOUNTER — Telehealth: Payer: Self-pay | Admitting: Radiation Oncology

## 2014-02-17 NOTE — Telephone Encounter (Signed)
Phoned to check in on patient. He reports that the sharp pain he felt in his penis has resolved. Also, patient reports he hasn't seen anymore hematuria. Patient states, "I am still not happy with my frequency.Marland Kitcheni urinate every hour." Overall improvement noted. Understands to contact with nurse with future needs. Will check in with patient in two weeks.

## 2014-03-06 ENCOUNTER — Telehealth: Payer: Self-pay | Admitting: Radiation Oncology

## 2014-03-06 NOTE — Telephone Encounter (Signed)
Phoned to check in with patient. He states, "I am doing great." Patient reports nocturia x1. Denies dysuria, hematuria, frequency, or penile pain. Patient denies any needs at this time but, understands to call this RN with changes. Patient expressed appreciation for the call.

## 2014-03-24 ENCOUNTER — Encounter: Payer: Self-pay | Admitting: Internal Medicine

## 2014-03-24 ENCOUNTER — Ambulatory Visit (INDEPENDENT_AMBULATORY_CARE_PROVIDER_SITE_OTHER): Payer: Medicare Other | Admitting: Internal Medicine

## 2014-03-24 VITALS — BP 140/88 | HR 71 | Temp 98.0°F | Resp 20 | Ht 68.0 in | Wt 230.0 lb

## 2014-03-24 DIAGNOSIS — E785 Hyperlipidemia, unspecified: Secondary | ICD-10-CM

## 2014-03-24 DIAGNOSIS — M1000 Idiopathic gout, unspecified site: Secondary | ICD-10-CM

## 2014-03-24 DIAGNOSIS — E0821 Diabetes mellitus due to underlying condition with diabetic nephropathy: Secondary | ICD-10-CM

## 2014-03-24 DIAGNOSIS — I10 Essential (primary) hypertension: Secondary | ICD-10-CM | POA: Diagnosis not present

## 2014-03-24 LAB — LIPID PANEL
CHOL/HDL RATIO: 5
Cholesterol: 116 mg/dL (ref 0–200)
HDL: 23.3 mg/dL — ABNORMAL LOW (ref >39.00)
LDL Cholesterol: 58 mg/dL (ref 0–99)
NONHDL: 92.7
Triglycerides: 175 mg/dL — ABNORMAL HIGH (ref 0.0–149.0)
VLDL: 35 mg/dL (ref 0.0–40.0)

## 2014-03-24 LAB — BASIC METABOLIC PANEL
BUN: 29 mg/dL — AB (ref 6–23)
CHLORIDE: 106 meq/L (ref 96–112)
CO2: 27 mEq/L (ref 19–32)
Calcium: 9.7 mg/dL (ref 8.4–10.5)
Creatinine, Ser: 1.8 mg/dL — ABNORMAL HIGH (ref 0.40–1.50)
GFR: 39.42 mL/min — AB (ref >60.00)
GLUCOSE: 117 mg/dL — AB (ref 70–99)
Potassium: 4.4 mEq/L (ref 3.5–5.1)
Sodium: 138 mEq/L (ref 135–145)

## 2014-03-24 LAB — HEMOGLOBIN A1C: Hgb A1c MFr Bld: 6.2 % (ref 4.6–6.5)

## 2014-03-24 NOTE — Progress Notes (Signed)
Pre visit review using our clinic review tool, if applicable. No additional management support is needed unless otherwise documented below in the visit note. 

## 2014-03-24 NOTE — Patient Instructions (Signed)
Limit your sodium (Salt) intake    It is important that you exercise regularly, at least 20 minutes 3 to 4 times per week.  If you develop chest pain or shortness of breath seek  medical attention.  Return in 6 months for follow-up  

## 2014-03-24 NOTE — Progress Notes (Signed)
Subjective:    Patient ID: Rodney [name], male    DOB: [birth year], 74 y.o.   MRN: [id number]  HPI 74 year old patient who is seen today for follow-up.  He has essential hypertension which has been well-controlled He has diabetes and chronic kidney disease.  Hemoglobin A1c's have been well controlled. He did have an eye exam last year as well as bilateral cataract extraction surgery.  He is doing well today without concerns or complaints Due to dyslipidemia.  He was resumed on statin therapy, which she continues to tolerate.  He is on atorvastatin 20 mg daily.  He has a history of prostate cancer. No cardiopulmonary complaints  Past Medical History  Diagnosis Date  . DIABETES MELLITUS, TYPE II 07/19/2007  . HYPERLIPIDEMIA 12/02/2008  . GOUT 07/19/2007  . HYPERTENSION 07/19/2007  . COLONIC POLYPS, HX OF 12/02/2008  . Erectile dysfunction   . Glaucoma   . Reiter syndrome     incomplete  . Prostate cancer   . GERD (gastroesophageal reflux disease)   . Kidney stones     History   Social History  . Marital Status: Married    Spouse Name: N/A  . Number of Children: N/A  . Years of Education: N/A   Occupational History  . Not on file.   Social History Main Topics  . Smoking status: Former Smoker    Types: Cigarettes    Quit date: 01/24/1978  . Smokeless tobacco: Never Used  . Alcohol Use: Yes     Comment: occasionally -socially  . Drug Use: No  . Sexual Activity: Not on file     Comment: 2 childern   Other Topics Concern  . Not on file   Social History Narrative    Past Surgical History  Procedure Laterality Date  . Vasectomy    . Elbow surgery      bone spur - right  . Prostatectomy    . Bladder neck dilation      Family History  Problem Relation Age of Onset  . Cancer Sister     metastatic colon ca    Allergies  Allergen Reactions  . Ace Inhibitors     Current Outpatient Prescriptions on File Prior to Visit  Medication Sig Dispense Refill  .  acyclovir (ZOVIRAX) 400 MG tablet Take 400 mg by mouth daily.     Marland Kitchen atorvastatin (LIPITOR) 20 MG tablet Take 1 tablet (20 mg total) by mouth daily. 90 tablet 1  . BENICAR 20 MG tablet TAKE ONE TABLET BY MOUTH ONCE DAILY 90 tablet 1  . BESIVANCE 0.6 % SUSP     . Blood Glucose Monitoring Suppl (ACCU-CHEK AVIVA) kit by Other route. Use as instructed     . clobetasol cream (TEMOVATE) 9.48 % Apply 1 application topically 2 (two) times daily.    . dorzolamide-timolol (COSOPT) 22.3-6.8 MG/ML ophthalmic solution     . glimepiride (AMARYL) 2 MG tablet Take 2 mg by mouth daily with breakfast.    . glucose blood (ACCU-CHEK AVIVA) test strip 1 each by Other route 2 (two) times daily. 100 each 3  . ibuprofen (ADVIL,MOTRIN) 200 MG tablet Take 2 tablets (400 mg total) by mouth 3 (three) times daily. 60 tablet 2  . Lancets (ACCU-CHEK MULTICLIX) lancets 1 each by Other route daily. Use as instructed     . latanoprost (XALATAN) 0.005 % ophthalmic solution 1 drop at bedtime.    . niacin (NIASPAN) 1000 MG CR tablet Take 1,000 mg by mouth at bedtime.      Marland Kitchen  ONGLYZA 2.5 MG TABS tablet TAKE ONE TABLET BY MOUTH ONCE DAILY 90 tablet 0  . pioglitazone (ACTOS) 45 MG tablet TAKE ONE TABLET BY MOUTH ONCE DAILY 90 tablet 1  . timolol (TIMOPTIC) 0.5 % ophthalmic solution 1 drop 2 (two) times daily.    . travoprost, benzalkonium, (TRAVATAN) 0.004 % ophthalmic solution 1 drop at bedtime.     No current facility-administered medications on file prior to visit.    BP 140/88 mmHg  Pulse 71  Temp(Src) 98 F (36.7 C) (Oral)  Resp 20  Ht _0  (1.727 m)  Wt 230 lb (104.327 kg)  BMI 34.98 kg/m2  SpO2 96%      Review of Systems  Constitutional: Negative for fever, chills, appetite change and fatigue.  HENT: Negative for congestion, dental problem, ear pain, hearing loss, sore throat, tinnitus, trouble swallowing and voice change.   Eyes: Negative for pain, discharge and visual disturbance.  Respiratory: Negative for  cough, chest tightness, wheezing and stridor.   Cardiovascular: Negative for chest pain, palpitations and leg swelling.  Gastrointestinal: Negative for nausea, vomiting, abdominal pain, diarrhea, constipation, blood in stool and abdominal distention.  Genitourinary: Negative for urgency, hematuria, flank pain, discharge, difficulty urinating and genital sores.  Musculoskeletal: Negative for myalgias, back pain, joint swelling, arthralgias, gait problem and neck stiffness.  Skin: Negative for rash.  Neurological: Negative for dizziness, syncope, speech difficulty, weakness, numbness and headaches.  Hematological: Negative for adenopathy. Does not bruise/bleed easily.  Psychiatric/Behavioral: Negative for behavioral problems and dysphoric mood. The patient is not nervous/anxious.        Objective:   Physical Exam  Constitutional: He is oriented to person, place, and time. He appears well-developed.  HENT:  Head: Normocephalic.  Right Ear: External ear normal.  Left Ear: External ear normal.  Eyes: Conjunctivae and EOM are normal.  Neck: Normal range of motion.  Cardiovascular: Normal rate and normal heart sounds.   Pulmonary/Chest: Breath sounds normal.  Abdominal: Bowel sounds are normal.  Musculoskeletal: Normal range of motion. He exhibits no edema or tenderness.  Neurological: He is alert and oriented to person, place, and time.  Psychiatric: He has a normal mood and affect. His behavior is normal.          Assessment & Plan:   Diabetes.  Will check a hemoglobin A1c Chronic kidney disease.  Will check renal indices Hypertension, well-controlled.  No change in therapy Dyslipidemia.  Will continue statin therapy.  Check lipid profile  CPX 6 months

## 2014-03-26 ENCOUNTER — Other Ambulatory Visit: Payer: Self-pay | Admitting: Internal Medicine

## 2014-04-01 DIAGNOSIS — E1122 Type 2 diabetes mellitus with diabetic chronic kidney disease: Secondary | ICD-10-CM | POA: Diagnosis not present

## 2014-04-01 DIAGNOSIS — N200 Calculus of kidney: Secondary | ICD-10-CM | POA: Diagnosis not present

## 2014-04-01 DIAGNOSIS — N183 Chronic kidney disease, stage 3 (moderate): Secondary | ICD-10-CM | POA: Diagnosis not present

## 2014-04-01 DIAGNOSIS — I129 Hypertensive chronic kidney disease with stage 1 through stage 4 chronic kidney disease, or unspecified chronic kidney disease: Secondary | ICD-10-CM | POA: Diagnosis not present

## 2014-04-01 DIAGNOSIS — C61 Malignant neoplasm of prostate: Secondary | ICD-10-CM | POA: Diagnosis not present

## 2014-04-08 DIAGNOSIS — C61 Malignant neoplasm of prostate: Secondary | ICD-10-CM | POA: Diagnosis not present

## 2014-04-08 DIAGNOSIS — N393 Stress incontinence (female) (male): Secondary | ICD-10-CM | POA: Diagnosis not present

## 2014-04-11 DIAGNOSIS — D649 Anemia, unspecified: Secondary | ICD-10-CM | POA: Diagnosis not present

## 2014-04-14 ENCOUNTER — Other Ambulatory Visit: Payer: Self-pay | Admitting: Internal Medicine

## 2014-04-22 ENCOUNTER — Other Ambulatory Visit: Payer: Self-pay | Admitting: Internal Medicine

## 2014-04-29 DIAGNOSIS — H4011X1 Primary open-angle glaucoma, mild stage: Secondary | ICD-10-CM | POA: Diagnosis not present

## 2014-06-17 ENCOUNTER — Encounter: Payer: Self-pay | Admitting: Gastroenterology

## 2014-07-17 ENCOUNTER — Other Ambulatory Visit: Payer: Self-pay | Admitting: Internal Medicine

## 2014-07-28 ENCOUNTER — Other Ambulatory Visit: Payer: Self-pay | Admitting: Internal Medicine

## 2014-07-29 ENCOUNTER — Other Ambulatory Visit: Payer: Self-pay

## 2014-07-29 MED ORDER — BENICAR 20 MG PO TABS: 20.0000 mg | ORAL_TABLET | Freq: Every day | ORAL | Status: DC

## 2014-07-29 NOTE — Telephone Encounter (Signed)
Rx request for Benicare 20 mg- Take one tablet by mouth once daily #90.  Pharm:  [facility] Rx sent to pharmacy for 6 month supply.

## 2014-08-12 ENCOUNTER — Other Ambulatory Visit: Payer: Self-pay | Admitting: Internal Medicine

## 2014-09-03 ENCOUNTER — Other Ambulatory Visit (INDEPENDENT_AMBULATORY_CARE_PROVIDER_SITE_OTHER): Payer: Medicare Other

## 2014-09-03 DIAGNOSIS — I10 Essential (primary) hypertension: Secondary | ICD-10-CM

## 2014-09-03 DIAGNOSIS — E785 Hyperlipidemia, unspecified: Secondary | ICD-10-CM | POA: Diagnosis not present

## 2014-09-03 DIAGNOSIS — Z8546 Personal history of malignant neoplasm of prostate: Secondary | ICD-10-CM | POA: Diagnosis not present

## 2014-09-03 DIAGNOSIS — Z0000 Encounter for general adult medical examination without abnormal findings: Secondary | ICD-10-CM

## 2014-09-03 LAB — CBC WITH DIFFERENTIAL/PLATELET
BASOS PCT: 0.5 % (ref 0.0–3.0)
Basophils Absolute: 0 10*3/uL (ref 0.0–0.1)
EOS PCT: 2.5 % (ref 0.0–5.0)
Eosinophils Absolute: 0.1 10*3/uL (ref 0.0–0.7)
HEMATOCRIT: 32.8 % — AB (ref 39.0–52.0)
HEMOGLOBIN: 11.2 g/dL — AB (ref 13.0–17.0)
Lymphocytes Relative: 15.4 % (ref 12.0–46.0)
Lymphs Abs: 0.7 10*3/uL (ref 0.7–4.0)
MCHC: 34.2 g/dL (ref 30.0–36.0)
MCV: 100.5 fl — ABNORMAL HIGH (ref 78.0–100.0)
MONOS PCT: 8.6 % (ref 3.0–12.0)
Monocytes Absolute: 0.4 10*3/uL (ref 0.1–1.0)
Neutro Abs: 3.1 10*3/uL (ref 1.4–7.7)
Neutrophils Relative %: 73 % (ref 43.0–77.0)
Platelets: 182 10*3/uL (ref 150.0–400.0)
RBC: 3.26 Mil/uL — AB (ref 4.22–5.81)
RDW: 15.2 % (ref 11.5–15.5)
WBC: 4.3 10*3/uL (ref 4.0–10.5)

## 2014-09-03 LAB — BASIC METABOLIC PANEL
BUN: 32 mg/dL — ABNORMAL HIGH (ref 6–23)
CO2: 27 mEq/L (ref 19–32)
Calcium: 9.4 mg/dL (ref 8.4–10.5)
Chloride: 106 mEq/L (ref 96–112)
Creatinine, Ser: 1.88 mg/dL — ABNORMAL HIGH (ref 0.40–1.50)
GFR: 37.45 mL/min — ABNORMAL LOW (ref >60.00)
Glucose, Bld: 120 mg/dL — ABNORMAL HIGH (ref 70–99)
Potassium: 5.2 mEq/L — ABNORMAL HIGH (ref 3.5–5.1)
SODIUM: 141 meq/L (ref 135–145)

## 2014-09-03 LAB — POCT URINALYSIS DIPSTICK
BILIRUBIN UA: NEGATIVE
Blood, UA: NEGATIVE
GLUCOSE UA: NEGATIVE
Ketones, UA: NEGATIVE
LEUKOCYTES UA: NEGATIVE
Nitrite, UA: NEGATIVE
Protein, UA: NEGATIVE
SPEC GRAV UA: 1.02
Urobilinogen, UA: 0.2
pH, UA: 6.5

## 2014-09-03 LAB — MICROALBUMIN / CREATININE URINE RATIO
CREATININE, U: 120.7 mg/dL
Microalb Creat Ratio: 2 mg/g (ref 0.0–30.0)
Microalb, Ur: 2.4 mg/dL — ABNORMAL HIGH (ref 0.0–1.9)

## 2014-09-03 LAB — LIPID PANEL
Cholesterol: 119 mg/dL (ref 0–200)
HDL: 20.8 mg/dL — ABNORMAL LOW (ref >39.00)
LDL Cholesterol: 65 mg/dL (ref 0–99)
NonHDL: 98.44
Total CHOL/HDL Ratio: 6
Triglycerides: 166 mg/dL — ABNORMAL HIGH (ref 0.0–149.0)
VLDL: 33.2 mg/dL (ref 0.0–40.0)

## 2014-09-03 LAB — HEMOGLOBIN A1C: Hgb A1c MFr Bld: 5.9 % (ref 4.6–6.5)

## 2014-09-03 LAB — HEPATIC FUNCTION PANEL
ALT: 14 U/L (ref 0–53)
AST: 14 U/L (ref 0–37)
Albumin: 4.1 g/dL (ref 3.5–5.2)
Alkaline Phosphatase: 53 U/L (ref 39–117)
Bilirubin, Direct: 0.1 mg/dL (ref 0.0–0.3)
Total Bilirubin: 0.7 mg/dL (ref 0.2–1.2)
Total Protein: 6.7 g/dL (ref 6.0–8.3)

## 2014-09-03 LAB — PSA: PSA: 0.67 ng/mL (ref 0.10–4.00)

## 2014-09-03 LAB — TSH: TSH: 3.93 u[IU]/mL (ref 0.35–4.50)

## 2014-09-08 ENCOUNTER — Ambulatory Visit (INDEPENDENT_AMBULATORY_CARE_PROVIDER_SITE_OTHER): Payer: Medicare Other | Admitting: Internal Medicine

## 2014-09-08 ENCOUNTER — Encounter: Payer: Self-pay | Admitting: Internal Medicine

## 2014-09-08 VITALS — BP 138/70 | HR 86 | Temp 98.0°F | Resp 20 | Ht 67.25 in | Wt 232.0 lb

## 2014-09-08 DIAGNOSIS — N183 Chronic kidney disease, stage 3 unspecified: Secondary | ICD-10-CM

## 2014-09-08 DIAGNOSIS — Z0000 Encounter for general adult medical examination without abnormal findings: Secondary | ICD-10-CM

## 2014-09-08 DIAGNOSIS — I10 Essential (primary) hypertension: Secondary | ICD-10-CM

## 2014-09-08 DIAGNOSIS — Z23 Encounter for immunization: Secondary | ICD-10-CM

## 2014-09-08 DIAGNOSIS — E0821 Diabetes mellitus due to underlying condition with diabetic nephropathy: Secondary | ICD-10-CM | POA: Diagnosis not present

## 2014-09-08 DIAGNOSIS — Z86010 Personal history of colonic polyps: Secondary | ICD-10-CM

## 2014-09-08 DIAGNOSIS — E785 Hyperlipidemia, unspecified: Secondary | ICD-10-CM

## 2014-09-08 NOTE — Progress Notes (Signed)
Patient ID: Rodney [name], male   DOB: March 04, 1940, 74 y.o.   MRN: [id number]  Subjective:    Patient ID: Rodney [name], male    DOB: [birth year], 74 y.o.   MRN: [id number]  HPI  History of Present Illness:   74  year-old patient who is seen today for a comprehensive evaluation.  Problems include type 2 diabetes, history of prostate cancer followed closely by urology. He has a history of gout, which has been stable, as well as hypertension. Is followed by ophthalmology for glaucoma. No concerns or complaints today.  He is now status post bilateral cataract extraction surgery .  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     He is also followed by nephrology do to chronic kidney disease   1. Risk factors, based on past  M,S,F history. Cardiovascular risk factors include dyslipidemia diabetes hypertension  2.  Physical activities: Remains fairly active;  avid fisherman  3.  Depression/mood: No history of depression or mood disorder  4.  Hearing: No significant deficits  5.  ADL's: Independent in all aspects of daily living 6.  Fall risk: Low 7.  Home safety: No problems identified  8.  Height weight, and visual acuity; height and weight stable no change in visual acuity  9.  Counseling: We'll regular exercise modest weight loss encouraged 10. Lab orders based on risk factors: Laboratory profile including renal function studies and lipid panel discussed hemoglobin A1c 6.6  11. Referral : We'll set up for a final colonoscopy in view of his history of colonic polyps  12. Care plan: Follow nephrology and urology. Modest weight loss encouraged  13. Cognitive assessment: Alert and oriented with normal affect  14.  Preventive services will include annual clinical exams.  He'll be followed periodically by urology, ophthalmology and nephrology.  He'll be scheduled for his final  colonoscopy.  15.  Provider list update includes primary care GI urology, nephrology and ophthalmology     Preventive Screening-Counseling & Management  Alcohol-Tobacco  Smoking Status: quit   Allergies:  1) ! Ace Inhibitors   Past History:  Past Medical History:   prostate cancer  Diabetes mellitus, type II  Gout  Hypertension  ED  history of glaucoma  history incomplete Reiter's syndrome  Hyperlipidemia-low HDL cholesterol  Colonic polyps, hx of  Nephrolithiasis  Past Surgical History:   status post nerve sparing R LRP for prostate cancer, July 2007  Vasectomy  surgery for bone spur, right arm  flexible sigmoidoscopy 2003  colonoscopy November 2009   Family History:   father deceased, health unknown  mother, age 44  One brother  two sisters: one half-sister with metastatic colon cancer   Social History:   Married  two 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  HENT: Negative for congestion, dental problem, ear pain, hearing loss, mouth sores, rhinorrhea, sinus pressure, sneezing, tinnitus, trouble swallowing and voice change.   Eyes: Negative for photophobia, pain, redness and visual disturbance.  Respiratory: Negative for apnea, cough, choking, chest tightness, shortness of breath and wheezing.   Cardiovascular: Negative for chest pain, palpitations and leg swelling.  Gastrointestinal: Negative for nausea, vomiting, abdominal pain, diarrhea, constipation, blood in stool, abdominal distention, anal bleeding and rectal pain.  Genitourinary: Negative for dysuria, urgency, frequency, hematuria, flank pain, decreased  urine volume, discharge, penile swelling, scrotal swelling, difficulty urinating, genital sores and testicular pain.  Musculoskeletal: Negative for myalgias, back pain, joint swelling, arthralgias, gait problem, neck pain and neck  stiffness.  Skin: Negative for color change, rash and wound.  Neurological: Negative for dizziness, tremors, seizures, syncope, facial asymmetry, speech difficulty, weakness, light-headedness, numbness and headaches.  Hematological: Negative for adenopathy. Does not bruise/bleed easily.  Psychiatric/Behavioral: Negative for suicidal ideas, hallucinations, behavioral problems, confusion, sleep disturbance, self-injury, dysphoric mood, decreased concentration and agitation. The patient is not nervous/anxious.        Objective:   Physical Exam  Constitutional: He appears well-developed and well-nourished.  HENT:  Head: Normocephalic and atraumatic.  Right Ear: External ear normal.  Left Ear: External ear normal.  Nose: Nose normal.  Mouth/Throat: Oropharynx is clear and moist.  Upper dentures in place   Eyes: Conjunctivae and EOM are normal. Pupils are equal, round, and reactive to light. No scleral icterus.  Neck: Normal range of motion. Neck supple. No JVD present. No thyromegaly present.  Cardiovascular: Regular rhythm, normal heart sounds and intact distal pulses.  Exam reveals no gallop and no friction rub.   No murmur heard. Dorsalis pedis pulses not easily palpable.  Posterior tibial pulses full  Pulmonary/Chest: Effort normal and breath sounds normal. He exhibits no tenderness.  Abdominal: Soft. Bowel sounds are normal. He exhibits no distension and no mass. There is no tenderness.  Musculoskeletal: Normal range of motion. He exhibits no edema or tenderness.  Lymphadenopathy:    He has no cervical adenopathy.  Neurological: He is alert. He has normal reflexes. No cranial nerve deficit. Coordination normal.  Skin: Skin is warm and dry. No rash noted.  Psychiatric: He has a normal mood and affect. His behavior is normal.          Assessment & Plan:    Preventive health examination Diabetes mellitus.  Well-controlled Chronic kidney disease.  Stable.  Follow-up  nephrology Glaucoma. Stable per ophthalmology History prostate cancer Hypertension controlled Dyslipidemia History colonic polyps. We'll need followup colonoscopy late this year

## 2014-09-08 NOTE — Patient Instructions (Signed)
Limit your sodium (Salt) intake    It is important that you exercise regularly, at least 20 minutes 3 to 4 times per week.  If you develop chest pain or shortness of breath seek  medical attention.  You need to lose weight.  Consider a lower calorie diet and regular exercise.  Return in 6 months for follow-up  Follow-up renal medicine ophthalmology and urology  Schedule your colonoscopy to help detect colon cancer.

## 2014-09-08 NOTE — Progress Notes (Signed)
Pre visit review using our clinic review tool, if applicable. No additional management support is needed unless otherwise documented below in the visit note. 

## 2014-09-09 ENCOUNTER — Encounter: Payer: Self-pay | Admitting: Internal Medicine

## 2014-09-15 ENCOUNTER — Other Ambulatory Visit: Payer: Self-pay | Admitting: Internal Medicine

## 2014-09-15 DIAGNOSIS — H4011X1 Primary open-angle glaucoma, mild stage: Secondary | ICD-10-CM | POA: Diagnosis not present

## 2014-10-01 ENCOUNTER — Encounter: Payer: Self-pay | Admitting: Internal Medicine

## 2014-10-17 DIAGNOSIS — C61 Malignant neoplasm of prostate: Secondary | ICD-10-CM | POA: Diagnosis not present

## 2014-10-23 ENCOUNTER — Other Ambulatory Visit: Payer: Self-pay | Admitting: Internal Medicine

## 2014-10-24 DIAGNOSIS — N393 Stress incontinence (female) (male): Secondary | ICD-10-CM | POA: Diagnosis not present

## 2014-10-24 DIAGNOSIS — C61 Malignant neoplasm of prostate: Secondary | ICD-10-CM | POA: Diagnosis not present

## 2014-11-12 ENCOUNTER — Other Ambulatory Visit: Payer: Self-pay | Admitting: Internal Medicine

## 2014-11-20 ENCOUNTER — Encounter: Payer: Medicare Other | Admitting: Internal Medicine

## 2014-12-03 ENCOUNTER — Ambulatory Visit (AMBULATORY_SURGERY_CENTER): Payer: Self-pay | Admitting: *Deleted

## 2014-12-03 VITALS — Ht 68.0 in | Wt 236.0 lb

## 2014-12-03 DIAGNOSIS — Z86010 Personal history of colonic polyps: Secondary | ICD-10-CM

## 2014-12-03 MED ORDER — NA SULFATE-K SULFATE-MG SULF 17.5-3.13-1.6 GM/177ML PO SOLN: ORAL | Status: DC

## 2014-12-03 NOTE — Progress Notes (Signed)
No egg or soy allergy  No anesthesia or intubation problems per pt  No diet medications taken  Registered in EMMI   

## 2014-12-11 ENCOUNTER — Encounter: Payer: Self-pay | Admitting: Internal Medicine

## 2014-12-11 ENCOUNTER — Ambulatory Visit (AMBULATORY_SURGERY_CENTER): Payer: Medicare Other | Admitting: Internal Medicine

## 2014-12-11 VITALS — BP 139/87 | HR 69 | Temp 97.8°F | Resp 17 | Ht 68.0 in | Wt 236.0 lb

## 2014-12-11 DIAGNOSIS — D122 Benign neoplasm of ascending colon: Secondary | ICD-10-CM | POA: Diagnosis not present

## 2014-12-11 DIAGNOSIS — D120 Benign neoplasm of cecum: Secondary | ICD-10-CM | POA: Diagnosis not present

## 2014-12-11 DIAGNOSIS — D123 Benign neoplasm of transverse colon: Secondary | ICD-10-CM | POA: Diagnosis not present

## 2014-12-11 DIAGNOSIS — Z86010 Personal history of colonic polyps: Secondary | ICD-10-CM

## 2014-12-11 LAB — GLUCOSE, CAPILLARY
Glucose-Capillary: 109 mg/dL — ABNORMAL HIGH (ref 65–99)
Glucose-Capillary: 108 mg/dL — ABNORMAL HIGH (ref 65–99)

## 2014-12-11 MED ORDER — SODIUM CHLORIDE 0.9 % IV SOLN: 500.0000 mL | INTRAVENOUS | Status: DC

## 2014-12-11 NOTE — Progress Notes (Signed)
Called to room to assist during endoscopic procedure.  Patient ID and intended procedure confirmed with present staff. Received instructions for my participation in the procedure from the performing physician.  

## 2014-12-11 NOTE — Op Note (Signed)
[facility]  [address] [place], [id number]   COLONOSCOPY PROCEDURE REPORT  PATIENT: Rodney, [name]  MR#: [id number] BIRTHDATE: February 27, 1940 , 74  yrs. old GENDER: male ENDOSCOPIST: Jerene Bears, MD REFERRED [id number] Burnice [name], M.D. PROCEDURE DATE:  12/11/2014 PROCEDURE:   Colonoscopy, surveillance and Colonoscopy with snare polypectomy First Screening Colonoscopy - Avg.  risk and is 50 yrs.  old or older - No.  Prior Negative Screening - Now for repeat screening. N/A  History of Adenoma - Now for follow-up colonoscopy & has been > or = to 3 yrs.  Yes hx of adenoma.  Has been 3 or more years since last colonoscopy.  Polyps removed today? Yes ASA CLASS:   Class III INDICATIONS:Surveillance due to prior colonic neoplasia and PH Colon Adenoma. MEDICATIONS: Monitored anesthesia care and Propofol 260 mg IV  DESCRIPTION OF PROCEDURE:   After the risks benefits and alternatives of the procedure were thoroughly explained, informed consent was obtained.  The digital rectal exam revealed no rectal mass.   The LB [id number]  endoscope was introduced through the anus and advanced to the cecum, which was identified by both the appendix and ileocecal valve. No adverse events experienced. The quality of the prep was good.  (Suprep was used)  The instrument was then slowly withdrawn as the colon was fully examined. Estimated blood loss is zero unless otherwise noted in this procedure report.   COLON FINDINGS: Eight sessile polyps ranging between 3-54mm in size were found in the ascending colon (1), at the cecum (4), and in the transverse colon (3).  Polypectomies were performed with a cold snare.  The resection was complete, the polyp tissue was completely retrieved and sent to histology.   The examination was otherwise normal.  Retroflexed views revealed internal hemorrhoids. The time to cecum = 3.5 Withdrawal time = 13.7   The scope was withdrawn and the  procedure completed. COMPLICATIONS: There were no immediate complications.  ENDOSCOPIC IMPRESSION: 1.   Eight sessile polyps ranging between 3-87mm in size were found in the ascending colon, at the cecum, and in the transverse colon; polypectomies were performed with a cold snare 2.   The examination was otherwise normal  RECOMMENDATIONS: 1.  Await pathology results 2.  If the polyps removed today are proven to be adenomatous (pre-cancerous) polyps, you will need a colonoscopy in 3 years. Otherwise 5 years.  You will receive a letter within 1-2 weeks with the results of your biopsy as well as final recommendations. Please call my office if you have not received a letter after 3 weeks.  eSigned:  Jerene Bears, MD 12/11/2014 11:00 AM   cc: Marletta Lor, MD and The Patient   PATIENT NAME:  Rodney, [name] MR#: [id number]

## 2014-12-11 NOTE — Patient Instructions (Signed)
YOU HAD AN ENDOSCOPIC PROCEDURE TODAY AT [facility]:   Refer to the procedure report that was given to you for any specific questions about what was found during the examination.  If the procedure report does not answer your questions, please call your gastroenterologist to clarify.  If you requested that your care partner not be given the details of your procedure findings, then the procedure report has been included in a sealed envelope for you to review at your convenience later.  YOU SHOULD EXPECT: Some feelings of bloating in the abdomen. Passage of more gas than usual.  Walking can help get rid of the air that was put into your GI tract during the procedure and reduce the bloating. If you had a lower endoscopy (such as a colonoscopy or flexible sigmoidoscopy) you may notice spotting of blood in your stool or on the toilet paper. If you underwent a bowel prep for your procedure, you may not have a normal bowel movement for a few days.  Please Note:  You might notice some irritation and congestion in your nose or some drainage.  This is from the oxygen used during your procedure.  There is no need for concern and it should clear up in a day or so.  SYMPTOMS TO REPORT IMMEDIATELY:   Following lower endoscopy (colonoscopy or flexible sigmoidoscopy):  Excessive amounts of blood in the stool  Significant tenderness or worsening of abdominal pains  Swelling of the abdomen that is new, acute  Fever of 100F or higher   For urgent or emergent issues, a gastroenterologist can be reached at any hour by calling [phone].   DIET: Your first meal following the procedure should be a small meal and then it is ok to progress to your normal diet. Heavy or fried foods are harder to digest and may make you feel nauseous or bloated.  Likewise, meals heavy in dairy and vegetables can increase bloating.  Drink plenty of fluids but you should avoid alcoholic beverages for 24  hours.  ACTIVITY:  You should plan to take it easy for the rest of today and you should NOT DRIVE or use heavy machinery until tomorrow (because of the sedation medicines used during the test).    FOLLOW UP: Our staff will call the number listed on your records the next business day following your procedure to check on you and address any questions or concerns that you may have regarding the information given to you following your procedure. If we do not reach you, we will leave a message.  However, if you are feeling well and you are not experiencing any problems, there is no need to return our call.  We will assume that you have returned to your regular daily activities without incident.  If any biopsies were taken you will be contacted by phone or by letter within the next 1-3 weeks.  Please call us at [phone] if you have not heard about the biopsies in 3 weeks.    SIGNATURES/CONFIDENTIALITY: You and/or your care partner have signed paperwork which will be entered into your electronic medical record.  These signatures attest to the fact that that the information above on your After Visit Summary has been reviewed and is understood.  Full responsibility of the confidentiality of this discharge information lies with you and/or your care-partner.  Polyp/ Hemorroids handout given Await pathology results

## 2014-12-11 NOTE — Progress Notes (Signed)
Report to PACU, RN, vss, BBS= Clear.  

## 2014-12-12 ENCOUNTER — Telehealth: Payer: Self-pay | Admitting: *Deleted

## 2014-12-12 NOTE — Telephone Encounter (Signed)
  Follow up Call-  Call back number 12/11/2014  Post procedure Call Back phone  # [phone] hm  Permission to leave phone message Yes     Patient questions:  Do you have a fever, pain , or abdominal swelling? No. Pain Score  0 *  Have you tolerated food without any problems? Yes.    Have you been able to return to your normal activities? Yes.    Do you have any questions about your discharge instructions: Diet   No. Medications  No. Follow up visit  No.  Do you have questions or concerns about your Care? No.  Actions: * If pain score is 4 or above: No action needed, pain <4.

## 2014-12-16 ENCOUNTER — Encounter: Payer: Self-pay | Admitting: Internal Medicine

## 2015-01-12 DIAGNOSIS — E119 Type 2 diabetes mellitus without complications: Secondary | ICD-10-CM | POA: Diagnosis not present

## 2015-01-12 LAB — HM DIABETES EYE EXAM

## 2015-02-03 ENCOUNTER — Other Ambulatory Visit: Payer: Self-pay

## 2015-02-03 MED ORDER — BENICAR 20 MG PO TABS: 20.0000 mg | ORAL_TABLET | Freq: Every day | ORAL | Status: DC

## 2015-02-05 ENCOUNTER — Encounter: Payer: Self-pay | Admitting: Internal Medicine

## 2015-02-16 ENCOUNTER — Telehealth: Payer: Self-pay | Admitting: Internal Medicine

## 2015-02-16 MED ORDER — PIOGLITAZONE HCL 45 MG PO TABS: 45.0000 mg | ORAL_TABLET | Freq: Every day | ORAL | Status: DC

## 2015-02-16 NOTE — Telephone Encounter (Signed)
Pt's wife Verdis Frederickson notified Rx sent to pharmacy. Verdis Frederickson verbalized understanding and will let pt know.

## 2015-02-16 NOTE — Telephone Encounter (Signed)
Pt request refill of pioglitazone (ACTOS) 45 MG tablet  90 day if possible  Pt has appt on 2/15.  Pt states he has been calling [facility] for a week but we do not have a record. Pt is out.

## 2015-03-11 ENCOUNTER — Encounter: Payer: Self-pay | Admitting: Internal Medicine

## 2015-03-11 ENCOUNTER — Ambulatory Visit (INDEPENDENT_AMBULATORY_CARE_PROVIDER_SITE_OTHER): Payer: Medicare Other | Admitting: Internal Medicine

## 2015-03-11 VITALS — BP 138/80 | HR 102 | Temp 97.6°F | Resp 20 | Ht 68.0 in | Wt 230.0 lb

## 2015-03-11 DIAGNOSIS — E0821 Diabetes mellitus due to underlying condition with diabetic nephropathy: Secondary | ICD-10-CM | POA: Diagnosis not present

## 2015-03-11 DIAGNOSIS — I10 Essential (primary) hypertension: Secondary | ICD-10-CM

## 2015-03-11 DIAGNOSIS — E785 Hyperlipidemia, unspecified: Secondary | ICD-10-CM

## 2015-03-11 LAB — HEMOGLOBIN A1C: HEMOGLOBIN A1C: 6.2 % (ref 4.6–6.5)

## 2015-03-11 NOTE — [place] Notes (Signed)
Subjective:    Patient ID: Rodney [name], male    DOB: 13-Feb-1940, 75 y.o.   MRN: [id number]  HPI  Lab Results  Component Value Date   HGBA1C 5.9 09/03/2014    BP Readings from Last 3 Encounters:  03/11/15 138/80  12/11/14 139/87  09/08/14 47/42    75 year old patient who is seen today for follow-up of diabetes.  He has essential hypertension and dyslipidemia.  Remains on statin therapy.  Doing quite well.  Glycemic control has been excellent No cardiopulmonary complaints  The past 6 weeks he has had some occasional numbness left arm that is positional.  Numbness is aggravated by the left arm and his side and alleviated by raising the arm.  He describes paresthesias involving the entire left hand.  No motor weakness  Past Medical History  Diagnosis Date  . DIABETES MELLITUS, TYPE II 07/19/2007  . HYPERLIPIDEMIA 12/02/2008  . GOUT 07/19/2007  . HYPERTENSION 07/19/2007  . COLONIC POLYPS, HX OF 12/02/2008  . Erectile dysfunction   . Glaucoma   . Reiter syndrome     incomplete  . Prostate cancer ([place])   . GERD (gastroesophageal reflux disease)   . Kidney stones     Social History   Social History  . Marital Status: Married    Spouse Name: N/A  . Number of Children: N/A  . Years of Education: N/A   Occupational History  . Not on file.   Social History Main Topics  . Smoking status: Former Smoker    Types: Cigarettes    Quit date: 01/24/1978  . Smokeless tobacco: Never Used  . Alcohol Use: Yes     Comment: occasionally -socially  . Drug Use: No  . Sexual Activity: Not on file     Comment: 2 childern   Other Topics Concern  . Not on file   Social History Narrative    Past Surgical History  Procedure Laterality Date  . Vasectomy    . Elbow surgery      bone spur - right  . Prostatectomy    . Bladder neck dilation    . Cataract extraction      both eyes- 2015  . Colonoscopy      Family History  Problem Relation Age of Onset  . Cancer Sister       metastatic colon ca  . Colon cancer Neg Hx   . Esophageal cancer Neg Hx   . Rectal cancer Neg Hx   . Stomach cancer Neg Hx     Allergies  Allergen Reactions  . Ace Inhibitors     Tongue swelling    Current Outpatient Prescriptions on File Prior to Visit  Medication Sig Dispense Refill  . acyclovir (ZOVIRAX) 400 MG tablet Take 400 mg by mouth daily.     Marland Kitchen atorvastatin (LIPITOR) 20 MG tablet TAKE ONE TABLET BY MOUTH ONCE DAILY 90 tablet 1  . BENICAR 20 MG tablet Take 1 tablet (20 mg total) by mouth daily. 90 tablet 1  . BESIVANCE 0.6 % SUSP Takes twice daily    . Blood Glucose Monitoring Suppl (ACCU-CHEK AVIVA) kit by Other route. Use as instructed     . clobetasol cream (TEMOVATE) 8.85 % Apply 1 application topically 2 (two) times daily.    . dorzolamide-timolol (COSOPT) 22.3-6.8 MG/ML ophthalmic solution     . glimepiride (AMARYL) 2 MG tablet Take 2 mg by mouth daily with breakfast.    . glucose blood (ACCU-CHEK AVIVA) test strip 1 each  by Other route 2 (two) times daily. 100 each 3  . ibuprofen (ADVIL,MOTRIN) 200 MG tablet Take 2 tablets (400 mg total) by mouth 3 (three) times daily. 60 tablet 2  . Lancets (ACCU-CHEK MULTICLIX) lancets 1 each by Other route daily. Use as instructed     . latanoprost (XALATAN) 0.005 % ophthalmic solution 1 drop at bedtime.    . niacin (NIASPAN) 1000 MG CR tablet Take 1,000 mg by mouth at bedtime.      . pioglitazone (ACTOS) 45 MG tablet Take 1 tablet (45 mg total) by mouth daily. 90 tablet 1  . saxagliptin HCl (ONGLYZA) 2.5 MG TABS tablet Take 1 tablet (2.5 mg total) by mouth daily. 90 tablet 1  . timolol (TIMOPTIC) 0.5 % ophthalmic solution 1 drop 2 (two) times daily.     No current facility-administered medications on file prior to visit.    BP 138/80 mmHg  Pulse 102  Temp(Src) 97.6 F (36.4 C) (Oral)  Resp 20  Ht '5\' 8"'  (1.727 m)  Wt 230 lb (104.327 kg)  BMI 34.98 kg/m2  SpO2 93%     Review of Systems  Constitutional: Negative  for fever, chills, appetite change and fatigue.  HENT: Negative for congestion, dental problem, ear pain, hearing loss, sore throat, tinnitus, trouble swallowing and voice change.   Eyes: Negative for pain, discharge and visual disturbance.  Respiratory: Negative for cough, chest tightness, wheezing and stridor.   Cardiovascular: Negative for chest pain, palpitations and leg swelling.  Gastrointestinal: Negative for nausea, vomiting, abdominal pain, diarrhea, constipation, blood in stool and abdominal distention.  Genitourinary: Negative for urgency, hematuria, flank pain, discharge, difficulty urinating and genital sores.  Musculoskeletal: Negative for myalgias, back pain, joint swelling, arthralgias, gait problem and neck stiffness.  Skin: Negative for rash.  Neurological: Positive for numbness. Negative for dizziness, syncope, speech difficulty, weakness and headaches.  Hematological: Negative for adenopathy. Does not bruise/bleed easily.  Psychiatric/Behavioral: Negative for behavioral problems and dysphoric mood. The patient is not nervous/anxious.        Objective:   Physical Exam  Constitutional: He is oriented to person, place, and time. He appears well-developed.  HENT:  Head: Normocephalic.  Right Ear: External ear normal.  Left Ear: External ear normal.  Eyes: Conjunctivae and EOM are normal.  Neck: Normal range of motion.  Cardiovascular: Normal rate and normal heart sounds.   Pulmonary/Chest: Breath sounds normal.  Abdominal: Bowel sounds are normal.  Musculoskeletal: Normal range of motion. He exhibits no edema or tenderness.  Neurological: He is alert and oriented to person, place, and time.  Psychiatric: He has a normal mood and affect. His behavior is normal.          Assessment & Plan:  Diabetes mellitus.  Will check a hemoglobin A1c Hypertension, well-controlled Dyslipidemia.  Continue statin therapy Paresthesias, left arm.  This is very positional and  aggravated by the arm resting in a dependent position.  Exam is unremarkable with intact reflexes and normal strength report any new symptoms and just observe at this point    CPX 6 months

## 2015-03-11 NOTE — Patient Instructions (Signed)
Limit your sodium (Salt) intake   Please check your hemoglobin A1c every 3-6  Months    It is important that you exercise regularly, at least 20 minutes 3 to 4 times per week.  If you develop chest pain or shortness of breath seek  medical attention.  Return in 6 months for follow-up   

## 2015-03-11 NOTE — Progress Notes (Signed)
Pre visit review using our clinic review tool, if applicable. No additional management support is needed unless otherwise documented below in the visit note. 

## 2015-03-24 ENCOUNTER — Other Ambulatory Visit: Payer: Self-pay

## 2015-03-24 MED ORDER — ATORVASTATIN CALCIUM 20 MG PO TABS: 20.0000 mg | ORAL_TABLET | Freq: Every day | ORAL | Status: DC

## 2015-03-25 ENCOUNTER — Telehealth: Payer: Self-pay | Admitting: Internal Medicine

## 2015-03-25 MED ORDER — ATORVASTATIN CALCIUM 20 MG PO TABS: 20.0000 mg | ORAL_TABLET | Freq: Every day | ORAL | Status: DC

## 2015-03-25 NOTE — Telephone Encounter (Signed)
Patient stated the pharmacy haven't received medication atorvastatin (LIPITOR) 20 MG tablet, send to  [facility] [place], [place] - [address] [phone] (Phone) [phone] (Fax)

## 2015-03-25 NOTE — Telephone Encounter (Signed)
Rx faxed to pharmacy  

## 2015-03-25 NOTE — Telephone Encounter (Signed)
Pt's wife notified Rx sent to pharmacy.

## 2015-04-17 DIAGNOSIS — C61 Malignant neoplasm of prostate: Secondary | ICD-10-CM | POA: Diagnosis not present

## 2015-04-24 DIAGNOSIS — Z0000 Encounter for general adult medical examination without abnormal findings: Secondary | ICD-10-CM | POA: Diagnosis not present

## 2015-04-24 DIAGNOSIS — C61 Malignant neoplasm of prostate: Secondary | ICD-10-CM | POA: Diagnosis not present

## 2015-05-05 ENCOUNTER — Other Ambulatory Visit: Payer: Self-pay | Admitting: General Practice

## 2015-05-05 MED ORDER — SAXAGLIPTIN HCL 2.5 MG PO TABS: 2.5000 mg | ORAL_TABLET | Freq: Every day | ORAL | Status: DC

## 2015-05-11 DIAGNOSIS — H401131 Primary open-angle glaucoma, bilateral, mild stage: Secondary | ICD-10-CM | POA: Diagnosis not present

## 2015-05-16 ENCOUNTER — Emergency Department (HOSPITAL_COMMUNITY): Payer: Medicare Other

## 2015-05-16 ENCOUNTER — Inpatient Hospital Stay (HOSPITAL_COMMUNITY)
Admission: EM | Admit: 2015-05-16 | Discharge: 2015-05-18 | DRG: 494 | Disposition: A | Discharge status: Home Health | Payer: Medicare Other | Attending: Orthopedic Surgery | Admitting: Orthopedic Surgery

## 2015-05-16 ENCOUNTER — Encounter (HOSPITAL_COMMUNITY): Payer: Self-pay

## 2015-05-16 DIAGNOSIS — K219 Gastro-esophageal reflux disease without esophagitis: Secondary | ICD-10-CM | POA: Diagnosis not present

## 2015-05-16 DIAGNOSIS — Z419 Encounter for procedure for purposes other than remedying health state, unspecified: Secondary | ICD-10-CM

## 2015-05-16 DIAGNOSIS — W109XXA Fall (on) (from) unspecified stairs and steps, initial encounter: Secondary | ICD-10-CM | POA: Diagnosis present

## 2015-05-16 DIAGNOSIS — Z87891 Personal history of nicotine dependence: Secondary | ICD-10-CM | POA: Diagnosis not present

## 2015-05-16 DIAGNOSIS — Z79899 Other long term (current) drug therapy: Secondary | ICD-10-CM

## 2015-05-16 DIAGNOSIS — S82892A Other fracture of left lower leg, initial encounter for closed fracture: Secondary | ICD-10-CM

## 2015-05-16 DIAGNOSIS — Z7984 Long term (current) use of oral hypoglycemic drugs: Secondary | ICD-10-CM | POA: Diagnosis not present

## 2015-05-16 DIAGNOSIS — T148 Other injury of unspecified body region: Secondary | ICD-10-CM | POA: Diagnosis not present

## 2015-05-16 DIAGNOSIS — Z01818 Encounter for other preprocedural examination: Secondary | ICD-10-CM | POA: Diagnosis not present

## 2015-05-16 DIAGNOSIS — G8918 Other acute postprocedural pain: Secondary | ICD-10-CM | POA: Diagnosis not present

## 2015-05-16 DIAGNOSIS — M25572 Pain in left ankle and joints of left foot: Secondary | ICD-10-CM | POA: Diagnosis not present

## 2015-05-16 DIAGNOSIS — E119 Type 2 diabetes mellitus without complications: Secondary | ICD-10-CM | POA: Diagnosis not present

## 2015-05-16 DIAGNOSIS — E785 Hyperlipidemia, unspecified: Secondary | ICD-10-CM | POA: Diagnosis present

## 2015-05-16 DIAGNOSIS — S9002XA Contusion of left ankle, initial encounter: Secondary | ICD-10-CM | POA: Diagnosis not present

## 2015-05-16 DIAGNOSIS — S82852A Displaced trimalleolar fracture of left lower leg, initial encounter for closed fracture: Principal | ICD-10-CM | POA: Diagnosis present

## 2015-05-16 DIAGNOSIS — S82899A Other fracture of unspecified lower leg, initial encounter for closed fracture: Secondary | ICD-10-CM

## 2015-05-16 DIAGNOSIS — I10 Essential (primary) hypertension: Secondary | ICD-10-CM | POA: Diagnosis present

## 2015-05-16 LAB — CBC WITH DIFFERENTIAL/PLATELET
Basophils Absolute: 0 10*3/uL (ref 0.0–0.1)
Basophils Relative: 0 %
EOS ABS: 0.1 10*3/uL (ref 0.0–0.7)
Eosinophils Relative: 2 %
HEMATOCRIT: 32 % — AB (ref 39.0–52.0)
HEMOGLOBIN: 10.5 g/dL — AB (ref 13.0–17.0)
LYMPHS ABS: 0.5 10*3/uL — AB (ref 0.7–4.0)
LYMPHS PCT: 10 %
MCH: 33.1 pg (ref 26.0–34.0)
MCHC: 32.8 g/dL (ref 30.0–36.0)
MCV: 100.9 fL — AB (ref 78.0–100.0)
Monocytes Absolute: 0.4 10*3/uL (ref 0.1–1.0)
Monocytes Relative: 8 %
NEUTROS ABS: 4.1 10*3/uL (ref 1.7–7.7)
NEUTROS PCT: 80 %
Platelets: 211 10*3/uL (ref 150–400)
RBC: 3.17 MIL/uL — AB (ref 4.22–5.81)
RDW: 14.8 % (ref 11.5–15.5)
WBC: 5.1 10*3/uL (ref 4.0–10.5)

## 2015-05-16 LAB — BASIC METABOLIC PANEL
Anion gap: 6 (ref 5–15)
BUN: 28 mg/dL — ABNORMAL HIGH (ref 6–20)
CHLORIDE: 111 mmol/L (ref 101–111)
CO2: 23 mmol/L (ref 22–32)
Calcium: 9.1 mg/dL (ref 8.9–10.3)
Creatinine, Ser: 1.66 mg/dL — ABNORMAL HIGH (ref 0.61–1.24)
GFR calc non Af Amer: 39 mL/min — ABNORMAL LOW (ref >60)
GFR, EST AFRICAN AMERICAN: 45 mL/min — AB (ref >60)
Glucose, Bld: 119 mg/dL — ABNORMAL HIGH (ref 65–99)
POTASSIUM: 4.2 mmol/L (ref 3.5–5.1)
SODIUM: 140 mmol/L (ref 135–145)

## 2015-05-16 MED ORDER — HYDROCODONE-ACETAMINOPHEN 5-325 MG PO TABS
1.0000 | ORAL_TABLET | Freq: Four times a day (QID) | ORAL | Status: DC | PRN
  Administered 2015-05-17 – 2015-05-18 (×5): 1 via ORAL
  Filled 2015-05-16: qty 1
  Filled 2015-05-16: qty 2
  Filled 2015-05-16 (×3): qty 1

## 2015-05-16 MED ORDER — ATORVASTATIN CALCIUM 20 MG PO TABS
20.0000 mg | ORAL_TABLET | Freq: Every day | ORAL | Status: DC
  Administered 2015-05-18: 20 mg via ORAL
  Filled 2015-05-16 (×2): qty 1

## 2015-05-16 MED ORDER — FENTANYL CITRATE (PF) 100 MCG/2ML IJ SOLN
100.0000 ug | INTRAMUSCULAR | Status: DC | PRN
  Administered 2015-05-16 (×2): 100 ug via INTRAVENOUS
  Filled 2015-05-16 (×2): qty 2

## 2015-05-16 MED ORDER — GLUCOSE BLOOD VI STRP: 1.0000 | ORAL_STRIP | Freq: Two times a day (BID) | Status: DC

## 2015-05-16 MED ORDER — MORPHINE SULFATE (PF) 2 MG/ML IV SOLN: 0.5000 mg | INTRAVENOUS | Status: DC | PRN

## 2015-05-16 MED ORDER — DORZOLAMIDE HCL-TIMOLOL MAL 2-0.5 % OP SOLN
1.0000 [drp] | Freq: Every morning | OPHTHALMIC | Status: DC
  Administered 2015-05-18: 1 [drp] via OPHTHALMIC
  Filled 2015-05-16: qty 10

## 2015-05-16 MED ORDER — PIOGLITAZONE HCL 45 MG PO TABS
45.0000 mg | ORAL_TABLET | Freq: Every day | ORAL | Status: DC
  Administered 2015-05-18: 45 mg via ORAL
  Filled 2015-05-16 (×3): qty 1

## 2015-05-16 MED ORDER — LATANOPROST 0.005 % OP SOLN
1.0000 [drp] | Freq: Every day | OPHTHALMIC | Status: DC
Start: 2015-05-17 — End: 2015-05-18
  Administered 2015-05-17: 1 [drp] via OPHTHALMIC
  Filled 2015-05-16: qty 2.5

## 2015-05-16 MED ORDER — POTASSIUM CHLORIDE IN NACL 20-0.9 MEQ/L-% IV SOLN
INTRAVENOUS | Status: AC
  Administered 2015-05-17: via INTRAVENOUS
  Filled 2015-05-16 (×2): qty 1000

## 2015-05-16 MED ORDER — LINAGLIPTIN 5 MG PO TABS
5.0000 mg | ORAL_TABLET | Freq: Every day | ORAL | Status: DC
  Administered 2015-05-18: 5 mg via ORAL
  Filled 2015-05-16 (×2): qty 1

## 2015-05-16 NOTE — ED Notes (Signed)
Bed: [facility] Expected date:  Expected time:  Means of arrival:  Comments: EMS- 74yo fall; ankle injury, deformity/swelling

## 2015-05-16 NOTE — Consult Note (Signed)
Reason for Consult: Left ankle fracture Referring Physician: Dr Hilbert Corrigan is an 75 y.o. male.  HPI: Rodney [name] is a 75 year old patient who slipped down the stairs today and fell and injured his left ankle denies any loss of consciousness or any other orthopedic problems he does report inability to bear weight on the left ankle.  He has a fishing tournament in 3 weeks.  He is anxious to keep that date.  No family history of DVT or pulmonary embolism patient does have type 2 diabetes which is accommodating medical factor  Past Medical History  Diagnosis Date  . DIABETES MELLITUS, TYPE II 07/19/2007  . HYPERLIPIDEMIA 12/02/2008  . GOUT 07/19/2007  . HYPERTENSION 07/19/2007  . COLONIC POLYPS, HX OF 12/02/2008  . Erectile dysfunction   . Glaucoma   . Reiter syndrome     incomplete  . Prostate cancer ([place])   . GERD (gastroesophageal reflux disease)   . Kidney stones     Past Surgical History  Procedure Laterality Date  . Vasectomy    . Elbow surgery      bone spur - right  . Prostatectomy    . Bladder neck dilation    . Cataract extraction      both eyes- 2015  . Colonoscopy      Family History  Problem Relation Age of Onset  . Cancer Sister     metastatic colon ca  . Colon cancer Neg Hx   . Esophageal cancer Neg Hx   . Rectal cancer Neg Hx   . Stomach cancer Neg Hx     Social History:  reports that he quit smoking about 37 years ago. His smoking use included Cigarettes. He has never used smokeless tobacco. He reports that he drinks alcohol. He reports that he does not use illicit drugs.  Allergies:  Allergies  Allergen Reactions  . Ace Inhibitors     Tongue swelling. Reaction to benazepril only.     Medications: I have reviewed the patient's current medications.  No results found for this or any previous visit (from the past 48 hour(s)).  Dg Ankle Complete Left  05/16/2015  CLINICAL DATA:  Fall with left ankle deformity EXAM: LEFT ANKLE COMPLETE - 3+  VIEW COMPARISON:  None. FINDINGS: Transverse medial malleolus fracture with 11 mm lateral displacement of the medial malleolus fracture fragment. Oblique lateral malleolus fracture with 13 mm lateral displacement of the distal fracture fragment. 9 mm lateral subluxation of the talus relative to the distal tibial plafond. Probable nondisplaced posterior malleolus fracture in the left distal tibia. Diffuse soft tissue swelling. Tiny Achilles and plantar left calcaneal spurs. IMPRESSION: Trimalleolar left ankle fracture as described. Lateral 9 mm talar subluxation. Electronically Signed   By: Ilona Sorrel M.D.   On: 05/16/2015 18:31    Review of Systems  Constitutional: Negative.   HENT: Negative.   Eyes: Negative.   Respiratory: Negative.   Cardiovascular: Negative.   Gastrointestinal: Negative.   Genitourinary: Negative.   Musculoskeletal: Positive for joint pain.  Skin: Negative.   Neurological: Negative.   Endo/Heme/Allergies: Negative.   Psychiatric/Behavioral: Negative.    Blood pressure 153/94, pulse 94, temperature 97.5 F (36.4 C), temperature source Oral, resp. rate 18, SpO2 99 %. Physical Exam  Constitutional: He appears well-developed.  HENT:  Head: Normocephalic.  Eyes: Pupils are equal, round, and reactive to light.  Neck: Normal range of motion.  Cardiovascular: Normal rate.   Respiratory: Effort normal.  Neurological: He is alert.  Skin:  Skin is warm.  Psychiatric: He has a normal mood and affect.   left ankle demonstrates intact sensation on dorsum and plantar aspect of the foot some swelling is present but skin is intact ankle dorsi and plantar flexion is intact compartments are soft by mouth pulses palpable right leg demonstrates good range of motion hip ankle knee left knee has no effusion no groin pain internal/external rotation on the left-hand side motor sensory function in the upper extremities is intact at all joints  Assessment/Plan: Impression is mildly  displaced left ankle fracture.  Plan reduction tonight with splinting with operative fixation tomorrow risks and benefits discussed with the patient could not limited to infection or vessel damage incomplete healing.  Of nonweightbearing development of arthritis.  I don't think he'll be able to make a fishing tournament in 3 weeks.  Anticipate discharge home either Sunday night or Monday morning all questions answered  DEAN,GREGORY SCOTT 05/16/2015, 7:47 PM

## 2015-05-16 NOTE — ED Provider Notes (Signed)
CSN: [id number]     Arrival date & time 05/16/15  1741 History   First MD Initiated Contact with Patient 05/16/15 1747     No chief complaint on file.    (Consider location/radiation/quality/duration/timing/severity/associated sxs/prior Treatment) HPI Patient presents with left ankle pain following a fall. Patient was in his usual state of health prior to the fall. Fall occurred just prior to ED arrival. Patient denies trauma to other body parts. However, the patient had substantial, severe pain in the left ankle and foot since the fall. One attempt to ambulate after the fall was unsuccessful.   I discussed patient's case with EMS providers on arrival to the facility. Patient has a temporary splint in place.  Past Medical History  Diagnosis Date  . DIABETES MELLITUS, TYPE II 07/19/2007  . HYPERLIPIDEMIA 12/02/2008  . GOUT 07/19/2007  . HYPERTENSION 07/19/2007  . COLONIC POLYPS, HX OF 12/02/2008  . Erectile dysfunction   . Glaucoma   . Reiter syndrome     incomplete  . Prostate cancer ([place])   . GERD (gastroesophageal reflux disease)   . Kidney stones    Past Surgical History  Procedure Laterality Date  . Vasectomy    . Elbow surgery      bone spur - right  . Prostatectomy    . Bladder neck dilation    . Cataract extraction      both eyes- 2015  . Colonoscopy     Family History  Problem Relation Age of Onset  . Cancer Sister     metastatic colon ca  . Colon cancer Neg Hx   . Esophageal cancer Neg Hx   . Rectal cancer Neg Hx   . Stomach cancer Neg Hx    Social History  Substance Use Topics  . Smoking status: Former Smoker    Types: Cigarettes    Quit date: 01/24/1978  . Smokeless tobacco: Never Used  . Alcohol Use: Yes     Comment: occasionally -socially    Review of Systems  Constitutional:       Per HPI, otherwise negative  HENT:       No head trauma, complaints.  Respiratory:       Per HPI, otherwise negative  Cardiovascular:       Per HPI,  otherwise negative  Gastrointestinal: Negative for vomiting.  Endocrine:       Negative aside from HPI  Genitourinary:       Neg aside from HPI   Musculoskeletal:       Per HPI, otherwise negative  Skin: Positive for color change and wound.  Neurological: Negative for syncope.      Allergies  Ace inhibitors  Home Medications   Prior to Admission medications   Medication Sig Start Date End Date Taking? Authorizing Provider  acyclovir (ZOVIRAX) 400 MG tablet Take 400 mg by mouth daily.  03/05/12   Historical Provider, MD  atorvastatin (LIPITOR) 20 MG tablet Take 1 tablet (20 mg total) by mouth daily. 03/25/15   Marletta Lor, MD  BENICAR 20 MG tablet Take 1 tablet (20 mg total) by mouth daily. 02/03/15   Marletta Lor, MD  BESIVANCE 0.6 % SUSP Takes twice daily 11/06/13   Historical Provider, MD  Blood Glucose Monitoring Suppl (ACCU-CHEK AVIVA) kit by Other route. Use as instructed     Historical Provider, MD  clobetasol cream (TEMOVATE) 9.47 % Apply 1 application topically 2 (two) times daily.    Historical Provider, MD  dorzolamide-timolol (COSOPT) 22.3-6.8 MG/ML  ophthalmic solution  09/20/13   Historical Provider, MD  glimepiride (AMARYL) 2 MG tablet Take 2 mg by mouth daily with breakfast.    Historical Provider, MD  glucose blood (ACCU-CHEK AVIVA) test strip 1 each by Other route 2 (two) times daily. 06/24/11   Marletta Lor, MD  ibuprofen (ADVIL,MOTRIN) 200 MG tablet Take 2 tablets (400 mg total) by mouth 3 (three) times daily. 01/31/14   Tyler Pita, MD  Lancets (ACCU-CHEK MULTICLIX) lancets 1 each by Other route daily. Use as instructed     Historical Provider, MD  latanoprost (XALATAN) 0.005 % ophthalmic solution 1 drop at bedtime.    Historical Provider, MD  niacin (NIASPAN) 1000 MG CR tablet Take 1,000 mg by mouth at bedtime.      Historical Provider, MD  pioglitazone (ACTOS) 45 MG tablet Take 1 tablet (45 mg total) by mouth daily. 02/16/15   Marletta Lor, MD  saxagliptin HCl (ONGLYZA) 2.5 MG TABS tablet Take 1 tablet (2.5 mg total) by mouth daily. 05/05/15   Marletta Lor, MD  timolol (TIMOPTIC) 0.5 % ophthalmic solution 1 drop 2 (two) times daily.    Historical Provider, MD   There were no vitals taken for this visit. Physical Exam  Constitutional: He is oriented to person, place, and time. He appears well-developed. No distress.  HENT:  Head: Normocephalic and atraumatic.  Eyes: Conjunctivae and EOM are normal.  Cardiovascular: Normal rate and regular rhythm.   Pulmonary/Chest: Effort normal. No stridor. No respiratory distress.  Abdominal: He exhibits no distension.  Musculoskeletal: He exhibits no edema.       Left ankle: He exhibits swelling, ecchymosis and deformity. He exhibits no laceration and normal pulse. Tenderness. Lateral malleolus and medial malleolus tenderness found.  Neurological: He is alert and oriented to person, place, and time.  Skin: Skin is warm and dry.  Psychiatric: He has a normal mood and affect.  Nursing note and vitals reviewed.  After the initial evaluation discussed the patient's case with our orthopedic physician. Patient will likely have surgery tomorrow. ED Course  ORTHOPEDIC INJURY TREATMENT Date/Time: 05/16/2015 8:46 PM Performed by: Carmin Muskrat Authorized by: Carmin Muskrat Consent: The procedure was performed in an emergent situation. Verbal consent obtained. Risks and benefits: risks, benefits and alternatives were discussed Consent given by: patient Patient understanding: patient states understanding of the procedure being performed Patient consent: the patient's understanding of the procedure matches consent given Procedure consent: procedure consent matches procedure scheduled Relevant documents: relevant documents present and verified Test results: test results available and properly labeled Site marked: the operative site was marked Imaging studies: imaging  studies available Required items: required blood products, implants, devices, and special equipment available Patient identity confirmed: verbally with patient Time out: Immediately prior to procedure a "time out" was called to verify the correct patient, procedure, equipment, support staff and site/side marked as required. Injury location: ankle Location details: left ankle Injury type: fracture Fracture type: trimalleolar Pre-procedure neurovascular assessment: neurovascularly intact Pre-procedure distal perfusion: normal Pre-procedure neurological function: normal Pre-procedure range of motion: reduced Local anesthesia used: no Patient sedated: no Manipulation performed: yes Skeletal traction used: yes Reduction successful: yes Immobilization: splint Splint type: short leg Supplies used: plaster Post-procedure neurovascular assessment: post-procedure neurovascularly intact Post-procedure distal perfusion: normal Post-procedure neurological function: normal Post-procedure range of motion: unchanged Patient tolerance: Patient tolerated the procedure well with no immediate complications Comments: With the assistance of the orthopedic technician I reduced, splinted the patient's slightly displaced trimalleolar fracture. This  was done without complication.    (including critical care time) Labs Review Labs Reviewed  BASIC METABOLIC PANEL - Abnormal; Notable for the following:    Glucose, Bld 119 (*)    BUN 28 (*)    Creatinine, Ser 1.66 (*)    GFR calc non Af Amer 39 (*)    GFR calc Af Amer 45 (*)    All other components within normal limits  CBC WITH DIFFERENTIAL/PLATELET - Abnormal; Notable for the following:    RBC 3.17 (*)    Hemoglobin 10.5 (*)    HCT 32.0 (*)    MCV 100.9 (*)    Lymphs Abs 0.5 (*)    All other components within normal limits    Imaging Review Dg Ankle Complete Left  05/16/2015  CLINICAL DATA:  Fall with left ankle deformity EXAM: LEFT ANKLE  COMPLETE - 3+ VIEW COMPARISON:  None. FINDINGS: Transverse medial malleolus fracture with 11 mm lateral displacement of the medial malleolus fracture fragment. Oblique lateral malleolus fracture with 13 mm lateral displacement of the distal fracture fragment. 9 mm lateral subluxation of the talus relative to the distal tibial plafond. Probable nondisplaced posterior malleolus fracture in the left distal tibia. Diffuse soft tissue swelling. Tiny Achilles and plantar left calcaneal spurs. IMPRESSION: Trimalleolar left ankle fracture as described. Lateral 9 mm talar subluxation. Electronically Signed   By: Ilona Sorrel M.D.   On: 05/16/2015 18:31   I have personally reviewed and evaluated these images and lab results as part of my medical decision-making.   MDM  Patient presents after mechanical fall with deformity, pain throughout the left ankle. Patient has no other injuries, but does have trimalleolar displaced fracture of the ankle. After discussion with our orthopedic surgeon, and reduction of the patient's ankle, he was admitted for next day surgical repair.   Carmin Muskrat, MD 05/16/15 2055

## 2015-05-16 NOTE — ED Notes (Signed)
He states that he fell while carrying laundry down his steps--c/o left distal tibia area pain.  He arrives with splint applied by paramedics which I do not disturb.  He is met on arrival by Dr. Vanita Panda. He rec'd. A total of 265mg of Fentanyl en route to hospital.  CMS intact all toes bilat.

## 2015-05-17 ENCOUNTER — Inpatient Hospital Stay (HOSPITAL_COMMUNITY): Payer: Medicare Other

## 2015-05-17 ENCOUNTER — Encounter (HOSPITAL_COMMUNITY): Payer: Self-pay | Admitting: *Deleted

## 2015-05-17 ENCOUNTER — Inpatient Hospital Stay (HOSPITAL_COMMUNITY): Payer: Medicare Other | Admitting: Anesthesiology

## 2015-05-17 ENCOUNTER — Encounter (HOSPITAL_COMMUNITY)
Admission: EM | Disposition: A | Discharge status: Home Health | Payer: Self-pay | Source: Home / Self Care | Attending: Orthopedic Surgery

## 2015-05-17 HISTORY — PX: ORIF ANKLE FRACTURE: SHX5408

## 2015-05-17 LAB — COMPREHENSIVE METABOLIC PANEL
ALK PHOS: 53 U/L (ref 38–126)
ALT: 16 U/L — AB (ref 17–63)
ANION GAP: 5 (ref 5–15)
AST: 19 U/L (ref 15–41)
Albumin: 3.6 g/dL (ref 3.5–5.0)
BILIRUBIN TOTAL: 0.6 mg/dL (ref 0.3–1.2)
BUN: 26 mg/dL — ABNORMAL HIGH (ref 6–20)
CALCIUM: 8.5 mg/dL — AB (ref 8.9–10.3)
CO2: 23 mmol/L (ref 22–32)
CREATININE: 1.72 mg/dL — AB (ref 0.61–1.24)
Chloride: 114 mmol/L — ABNORMAL HIGH (ref 101–111)
GFR calc non Af Amer: 37 mL/min — ABNORMAL LOW (ref >60)
GFR, EST AFRICAN AMERICAN: 43 mL/min — AB (ref >60)
Glucose, Bld: 130 mg/dL — ABNORMAL HIGH (ref 65–99)
Potassium: 3.8 mmol/L (ref 3.5–5.1)
Sodium: 142 mmol/L (ref 135–145)
TOTAL PROTEIN: 6.2 g/dL — AB (ref 6.5–8.1)

## 2015-05-17 LAB — SURGICAL PCR SCREEN
MRSA, PCR: NEGATIVE
STAPHYLOCOCCUS AUREUS: NEGATIVE

## 2015-05-17 LAB — CBC
HEMATOCRIT: 30.1 % — AB (ref 39.0–52.0)
HEMOGLOBIN: 9.9 g/dL — AB (ref 13.0–17.0)
MCH: 33.2 pg (ref 26.0–34.0)
MCHC: 32.9 g/dL (ref 30.0–36.0)
MCV: 101 fL — AB (ref 78.0–100.0)
Platelets: 185 10*3/uL (ref 150–400)
RBC: 2.98 MIL/uL — ABNORMAL LOW (ref 4.22–5.81)
RDW: 14.9 % (ref 11.5–15.5)
WBC: 4.3 10*3/uL (ref 4.0–10.5)

## 2015-05-17 LAB — GLUCOSE, CAPILLARY: Glucose-Capillary: 109 mg/dL — ABNORMAL HIGH (ref 65–99)

## 2015-05-17 SURGERY — OPEN REDUCTION INTERNAL FIXATION (ORIF) ANKLE FRACTURE
Anesthesia: General | Site: Ankle | Laterality: Left

## 2015-05-17 MED ORDER — LIDOCAINE HCL (CARDIAC) 20 MG/ML IV SOLN
INTRAVENOUS | Status: DC | PRN
  Administered 2015-05-17: 80 mg via INTRATRACHEAL

## 2015-05-17 MED ORDER — CEFAZOLIN SODIUM-DEXTROSE 2-4 GM/100ML-% IV SOLN
INTRAVENOUS | Status: AC
  Filled 2015-05-17: qty 100

## 2015-05-17 MED ORDER — PROPOFOL 10 MG/ML IV BOLUS
INTRAVENOUS | Status: AC
  Filled 2015-05-17: qty 20

## 2015-05-17 MED ORDER — PHENYLEPHRINE HCL 10 MG/ML IJ SOLN
INTRAMUSCULAR | Status: DC | PRN
  Administered 2015-05-17 (×3): 80 ug via INTRAVENOUS
  Administered 2015-05-17 (×2): 40 ug via INTRAVENOUS
  Administered 2015-05-17: 80 ug via INTRAVENOUS

## 2015-05-17 MED ORDER — FENTANYL CITRATE (PF) 100 MCG/2ML IJ SOLN
INTRAMUSCULAR | Status: DC | PRN
  Administered 2015-05-17: 25 ug via INTRAVENOUS
  Administered 2015-05-17: 100 ug via INTRAVENOUS

## 2015-05-17 MED ORDER — FENTANYL CITRATE (PF) 100 MCG/2ML IJ SOLN
INTRAMUSCULAR | Status: AC
  Filled 2015-05-17: qty 2

## 2015-05-17 MED ORDER — HYDROMORPHONE HCL 1 MG/ML IJ SOLN
0.2500 mg | INTRAMUSCULAR | Status: DC | PRN
  Administered 2015-05-17: 0.5 mg via INTRAVENOUS

## 2015-05-17 MED ORDER — ISOPROPYL ALCOHOL 70 % SOLN
Status: AC
  Filled 2015-05-17: qty 480

## 2015-05-17 MED ORDER — ONDANSETRON HCL 4 MG/2ML IJ SOLN
INTRAMUSCULAR | Status: DC | PRN
  Administered 2015-05-17: 4 mg via INTRAVENOUS

## 2015-05-17 MED ORDER — CEFAZOLIN SODIUM-DEXTROSE 2-3 GM-% IV SOLR
2.0000 g | Freq: Once | INTRAVENOUS | Status: AC
  Administered 2015-05-17: 2 g via INTRAVENOUS

## 2015-05-17 MED ORDER — ROCURONIUM BROMIDE 100 MG/10ML IV SOLN
INTRAVENOUS | Status: AC
  Filled 2015-05-17: qty 1

## 2015-05-17 MED ORDER — LIDOCAINE-EPINEPHRINE (PF) 2 %-1:200000 IJ SOLN
INTRAMUSCULAR | Status: AC
  Filled 2015-05-17: qty 20

## 2015-05-17 MED ORDER — PHENYLEPHRINE HCL 10 MG/ML IJ SOLN
INTRAMUSCULAR | Status: AC
  Filled 2015-05-17: qty 2

## 2015-05-17 MED ORDER — 0.9 % SODIUM CHLORIDE (POUR BTL) OPTIME
TOPICAL | Status: DC | PRN
  Administered 2015-05-17 (×2): 1000 mL

## 2015-05-17 MED ORDER — BUPIVACAINE HCL (PF) 0.5 % IJ SOLN
INTRAMUSCULAR | Status: DC | PRN
  Administered 2015-05-17: 20 mL via PERINEURAL

## 2015-05-17 MED ORDER — ONDANSETRON HCL 4 MG/2ML IJ SOLN
INTRAMUSCULAR | Status: AC
Start: 2015-05-17 — End: 2015-05-17
  Filled 2015-05-17: qty 2

## 2015-05-17 MED ORDER — LIDOCAINE-EPINEPHRINE 2 %-1:100000 IJ SOLN
INTRAMUSCULAR | Status: DC | PRN
  Administered 2015-05-17: 15 mL via PERINEURAL

## 2015-05-17 MED ORDER — HYDROMORPHONE HCL 1 MG/ML IJ SOLN
INTRAMUSCULAR | Status: AC
  Filled 2015-05-17: qty 1

## 2015-05-17 MED ORDER — PROPOFOL 10 MG/ML IV BOLUS
INTRAVENOUS | Status: DC | PRN
  Administered 2015-05-17: 150 mg via INTRAVENOUS
  Administered 2015-05-17: 50 mg via INTRAVENOUS

## 2015-05-17 MED ORDER — PHENYLEPHRINE HCL 10 MG/ML IJ SOLN
20.0000 mg | INTRAVENOUS | Status: DC | PRN
  Administered 2015-05-17: 5 ug/min via INTRAVENOUS

## 2015-05-17 MED ORDER — OXYCODONE-ACETAMINOPHEN 5-325 MG PO TABS
1.0000 | ORAL_TABLET | Freq: Four times a day (QID) | ORAL | Status: DC | PRN
Start: 2015-05-17 — End: 2016-07-11

## 2015-05-17 MED ORDER — PHENYLEPHRINE 40 MCG/ML (10ML) SYRINGE FOR IV PUSH (FOR BLOOD PRESSURE SUPPORT)
PREFILLED_SYRINGE | INTRAVENOUS | Status: AC
  Filled 2015-05-17: qty 10

## 2015-05-17 MED ORDER — BUPIVACAINE HCL (PF) 0.5 % IJ SOLN
INTRAMUSCULAR | Status: AC
  Filled 2015-05-17: qty 30

## 2015-05-17 MED ORDER — ASPIRIN EC 325 MG PO TBEC: 325.0000 mg | DELAYED_RELEASE_TABLET | Freq: Every day | ORAL | Status: DC

## 2015-05-17 MED ORDER — LIDOCAINE HCL (CARDIAC) 20 MG/ML IV SOLN
INTRAVENOUS | Status: AC
  Filled 2015-05-17: qty 5

## 2015-05-17 MED ORDER — LACTATED RINGERS IV SOLN
INTRAVENOUS | Status: DC | PRN
  Administered 2015-05-17 (×2): via INTRAVENOUS

## 2015-05-17 SURGICAL SUPPLY — 59 items
BAG SPEC THK2 15X12 ZIP CLS (MISCELLANEOUS) ×1
BAG ZIPLOCK 12X15 (MISCELLANEOUS) ×3 IMPLANT
BANDAGE ACE 4X5 VEL STRL LF (GAUZE/BANDAGES/DRESSINGS) ×5 IMPLANT
BANDAGE ACE 6X5 VEL STRL LF (GAUZE/BANDAGES/DRESSINGS) ×3 IMPLANT
BIT DRILL 2.7 QC CANN 155 (BIT) ×1 IMPLANT
BIT DRILL 2.7 QC CANN 155MM (BIT) ×1
BIT DRILL 3.5 QC 155 (BIT) ×1 IMPLANT
BIT DRILL 3.5 QC 155MM (BIT) ×1
BIT DRILL QC 2.7 6.3IN  SHORT (BIT) ×2
BIT DRILL QC 2.7 6.3IN SHORT (BIT) IMPLANT
BNDG CMPR 9X4 STRL LF SNTH (GAUZE/BANDAGES/DRESSINGS) ×1
BNDG ESMARK 4X9 LF (GAUZE/BANDAGES/DRESSINGS) ×3 IMPLANT
COVER SURGICAL LIGHT HANDLE (MISCELLANEOUS) ×3 IMPLANT
CUFF TOURN SGL QUICK 34 (TOURNIQUET CUFF) ×3
CUFF TRNQT CYL 34X4X40X1 (TOURNIQUET CUFF) ×1 IMPLANT
DECANTER SPIKE VIAL GLASS SM (MISCELLANEOUS) ×3 IMPLANT
DRAPE C-ARM 42X120 X-RAY (DRAPES) ×3 IMPLANT
DRAPE U-SHAPE 47X51 STRL (DRAPES) ×6 IMPLANT
DRSG PAD ABDOMINAL 8X10 ST (GAUZE/BANDAGES/DRESSINGS) ×12 IMPLANT
ELECT REM PT RETURN 9FT ADLT (ELECTROSURGICAL) ×3
ELECTRODE REM PT RTRN 9FT ADLT (ELECTROSURGICAL) ×1 IMPLANT
GAUZE SPONGE 4X4 12PLY STRL (GAUZE/BANDAGES/DRESSINGS) ×4 IMPLANT
GAUZE XEROFORM 5X9 LF (GAUZE/BANDAGES/DRESSINGS) ×2 IMPLANT
GLOVE SURG ORTHO 8.0 STRL STRW (GLOVE) ×3 IMPLANT
GOWN STRL REUS W/TWL LRG LVL3 (GOWN DISPOSABLE) ×3 IMPLANT
NEEDLE HYPO 22GX1.5 SAFETY (NEEDLE) ×3 IMPLANT
NS IRRIG 1000ML POUR BTL (IV SOLUTION) ×5 IMPLANT
PACK ORTHO EXTREMITY (CUSTOM PROCEDURE TRAY) ×3 IMPLANT
PAD ABD 8X10 STRL (GAUZE/BANDAGES/DRESSINGS) ×12 IMPLANT
PAD CAST 4YDX4 CTTN HI CHSV (CAST SUPPLIES) ×1 IMPLANT
PADDING CAST COTTON 4X4 STRL (CAST SUPPLIES) ×6
PADDING CAST COTTON 6X4 STRL (CAST SUPPLIES) ×3 IMPLANT
PLATE FIBULA DISTAL 5 HOLE (Plate) ×2 IMPLANT
POSITIONER SURGICAL ARM (MISCELLANEOUS) ×3 IMPLANT
SCREW 5.0X12 (Screw) ×2 IMPLANT
SCREW CANNULATED 4.0X42 (Screw) ×2 IMPLANT
SCREW CANNULATED 4.1X40 (Screw) ×2 IMPLANT
SCREW LOCK 10X3.5XST NS (Screw) IMPLANT
SCREW LOCK 12X3.5XST PRLC (Screw) IMPLANT
SCREW LOCK 3.5X10 (Screw) ×3 IMPLANT
SCREW LOCK 3.5X12 (Screw) ×6 IMPLANT
SCREW LOCK 3.5X14 (Screw) ×2 IMPLANT
SCREW LOCKING 3.5X6 (Screw) ×2 IMPLANT
SCREW NL 3.5X14 (Screw) ×2 IMPLANT
SCREW NON LOCK 3.5X12 (Screw) ×4 IMPLANT
SCREW NONLOCK 3.5X18 (Screw) ×2 IMPLANT
SPLINT PLASTER CAST XFAST 5X30 (CAST SUPPLIES) IMPLANT
SPLINT PLASTER XFAST SET 5X30 (CAST SUPPLIES) ×4
STOCKINETTE 8 INCH (MISCELLANEOUS) ×3 IMPLANT
SUT ETHILON 3 0 PS 1 (SUTURE) ×10 IMPLANT
SUT ETHILON 4 0 PS 2 18 (SUTURE) ×6 IMPLANT
SUT VIC AB 2-0 CT1 27 (SUTURE) ×6
SUT VIC AB 2-0 CT1 TAPERPNT 27 (SUTURE) ×1 IMPLANT
SUT VIC AB 3-0 CT1 27 (SUTURE) ×3
SUT VIC AB 3-0 CT1 TAPERPNT 27 (SUTURE) IMPLANT
SYR CONTROL 10ML LL (SYRINGE) ×3 IMPLANT
TOWEL OR 17X26 10 PK STRL BLUE (TOWEL DISPOSABLE) ×6 IMPLANT
WATER STERILE IRR 1000ML POUR (IV SOLUTION) ×4 IMPLANT
WATER STERILE IRR 1500ML POUR (IV SOLUTION) ×3 IMPLANT

## 2015-05-17 NOTE — Anesthesia Preprocedure Evaluation (Signed)
Anesthesia Evaluation  Patient identified by MRN, date of birth, ID band Patient awake    Reviewed: Allergy & Precautions, NPO status , Patient's Chart, lab work & pertinent test results  Airway Mallampati: II  TM Distance: >3 FB Neck ROM: Full    Dental no notable dental hx.    Pulmonary neg pulmonary ROS, former smoker,    Pulmonary exam normal breath sounds clear to auscultation       Cardiovascular hypertension, Pt. on medications Normal cardiovascular exam Rhythm:Regular Rate:Normal     Neuro/Psych negative neurological ROS  negative psych ROS   GI/Hepatic negative GI ROS, Neg liver ROS,   Endo/Other  diabetes  Renal/GU Renal InsufficiencyRenal disease  negative genitourinary   Musculoskeletal negative musculoskeletal ROS (+)   Abdominal   Peds negative pediatric ROS (+)  Hematology  (+) anemia ,   Anesthesia Other Findings   Reproductive/Obstetrics negative OB ROS                             Anesthesia Physical Anesthesia Plan  ASA: III  Anesthesia Plan: General   Post-op Pain Management:    Induction: Intravenous  Airway Management Planned: Oral ETT  Additional Equipment:   Intra-op Plan:   Post-operative Plan: Extubation in OR  Informed Consent: I have reviewed the patients History and Physical, chart, labs and discussed the procedure including the risks, benefits and alternatives for the proposed anesthesia with the patient or authorized representative who has indicated his/her understanding and acceptance.   Dental advisory given  Plan Discussed with: CRNA and Surgeon  Anesthesia Plan Comments:         Anesthesia Quick Evaluation

## 2015-05-17 NOTE — Op Note (Signed)
NAME:  Rodney [name], Rodney [name] NO.:  [id number]  MEDICAL RECORD NO.:  [id number]  LOCATION:  [facility]                         FACILITY:  [facility]  PHYSICIAN:  Anderson Malta, M.D.    DATE OF BIRTH:  1940-02-24  DATE OF PROCEDURE: DATE OF DISCHARGE:                              OPERATIVE REPORT   PREOPERATIVE DIAGNOSIS:  Left trimalleolar ankle fracture.  POSTOPERATIVE DIAGNOSIS:  Left trimalleolar ankle fracture.  PROCEDURE:  Left trimalleolar ankle fracture, open reduction and internal fixation Smith and Nephew locking plate laterally 2 cannulated 4-0 screws medially with no fixation of the stable and less than 20% articular surface involved posterior fragment.  SURGEON:  Anderson Malta, M.D.  ASSISTANT:  None.  ANESTHESIA:  General.  INDICATIONS:  [name] is a patient with left ankle fracture presents for operative management after explanation of risks and benefits.  PROCEDURE DETAILS:  The patient was brought to the operating room where general anesthetic was induced.  Proper antibiotics were administered. A time-out was called.  Left leg was prescrubbed with alcohol and Betadine, allowed to air dry, prepped with DuraPrep solution and draped in a sterile manner.  [name] was used to cover the operative field. Ankle Esmarch utilized for about 54 minutes.  Lateral approach was made to the lateral malleolus.  Skin and subcutaneous tissues were sharply divided.  Care was taken to avoid injury to superficial peroneal nerve. Fracture was identified, irrigated, reduced using a reduction clamp. Good restoration of length and rotational alignment was confirmed.  Lag screw was placed proximal anterior to distal posterior.  This gave good fixation.  The patient's bone quality was excellent.  The lateral malleolar plate was then applied with a good screw fixation proximally and distally.  Correct location confirmed under fluoroscopy. Syndesmosis was stable.  Attention was then  directed towards the medial side.  Incision was made about 5 cm centered over the transverse fracture.  The saphenous vein and nerve mobilized away from the fracture site.  Fracture site was then visualized, irrigated, and reduced with reduction forceps.  Two screws were 4-0 cannulated partially threaded cancellous screws were placed perpendicular to the fracture line with a good fixation achieved.  Good reduction was achieved in the AP and lateral planes.  Following this, ankle Esmarch released.  Thorough irrigation performed on both incisions, which were then closed using combination of 2-0 Vicryl, 3-0 Vicryl, and 3-0 nylon.  A well-padded posterior splint was applied.  The patient tolerated the procedure well without immediate complications.  Transferred to recovery room in stable condition.     Anderson Malta, M.D.     GSD/MEDQ  D:  05/17/2015  T:  05/17/2015  Job:  [id number]

## 2015-05-17 NOTE — Evaluation (Signed)
Physical Therapy Evaluation Patient Details Name: Rodney [name] MRN: [id number] DOB: September 12, 1940 Today's Date: 05/17/2015   History of Present Illness  75 yo male adm after fall resulting in L trimalleolar ankle fx, s/p ORIF per Dr. Marlou Sa 05/17/15  Clinical Impression  Pt admitted with above diagnosis. Pt currently with functional limitations due to the deficits listed below (see PT Problem List).  Pt will benefit from skilled PT to increase their independence and safety with mobility to allow discharge to the venue listed below.   Pt unsafe to D/C home today--dizzy with initial sitting on EOB; mod assist to stand and amb limited to 26' with RW; pt would like to try knee scooter and has 3 steps to enter home; his wife is unable to physically assist as she is awaiting back surgery;  Will need to maximize [facility] services as much as possible     Follow Up Recommendations Home health PT;Supervision - Intermittent; will need HHOT also    Equipment Recommendations  Rolling walker with 5" wheels;Other (comment) (?knee scooter--pt states MD ordered?)    Recommendations for Other Services       Precautions / Restrictions Precautions Precautions: Fall Restrictions LLE Weight Bearing: Non weight bearing      Mobility  Bed Mobility Overal bed mobility: Needs Assistance Bed Mobility: Supine to Sit     Supine to sit: Min guard;Min assist     General bed mobility comments: min/guard to bring trunk forward  Transfers Overall transfer level: Needs assistance Equipment used: Rolling walker (2 wheeled) Transfers: Sit to/from Stand Sit to Stand: Mod assist;Max assist;+2 safety/equipment         General transfer comment: 4 attempts to come to full stand; cues for safety, hand placement and control of descent  Ambulation/Gait Ambulation/Gait assistance: Min assist Ambulation Distance (Feet): 11 Feet Assistive device: Rolling walker (2 wheeled)       General Gait Details: cues for  NWB, RW position, assist for balance  Stairs            Wheelchair Mobility    Modified Rankin (Stroke Patients Only)       Balance Overall balance assessment: Needs assistance;History of Falls   Sitting balance-Leahy Scale: Fair       Standing balance-Leahy Scale: Poor Standing balance comment: reliant on UEs to maintain balance                             Pertinent Vitals/Pain Pain Assessment: 0-10 Pain Score: 2  Pain Location: L ankle Pain Descriptors / Indicators: Aching Pain Intervention(s): Limited activity within patient's tolerance;Monitored during session    Home Living Family/patient expects to be discharged to:: Private residence Living Arrangements: Spouse/significant other   Type of Home: House Home Access: Stairs to enter   [employer] of Steps: 3 Home Layout: One level;Laundry or work area in [employer]: None Additional Comments: does tounament bass fishing    Prior Function Level of Independence: Independent               Hand Dominance        Extremity/Trunk Assessment               Lower Extremity Assessment: LLE deficits/detail   LLE Deficits / Details: unable to wiggle toes-intact to light touch; knee AROM grossly WFL, strength grossly 3 to 3+/5     Communication   Communication: No difficulties  Cognition Arousal/Alertness: Awake/alert Behavior During Therapy: [facility]  for tasks assessed/performed Overall Cognitive Status: Within Functional Limits for tasks assessed                      General Comments      Exercises        Assessment/Plan    PT Assessment Patient needs continued PT services  PT Diagnosis Difficulty walking   PT Problem List Decreased activity tolerance;Decreased balance;Decreased mobility;Decreased knowledge of precautions;Decreased safety awareness;Decreased knowledge of use of DME;Pain  PT Treatment Interventions DME instruction;Gait  training;Stair training;Functional mobility training;Therapeutic activities;Therapeutic exercise;Patient/family education   PT Goals (Current goals can be found in the Care Plan section) Acute Rehab PT Goals Patient Stated Goal: get back to fishing PT Goal Formulation: With patient Time For Goal Achievement: 05/20/15 Potential to Achieve Goals: Good    Frequency Min 6X/week   Barriers to discharge Decreased caregiver support      Co-evaluation               End of Session Equipment Utilized During Treatment: Gait belt Activity Tolerance: Patient limited by fatigue Patient left: in chair;with call bell/phone within reach;with chair alarm set           Time: 1555-1615 PT Time Calculation (min) (ACUTE ONLY): 20 min   Charges:   PT Evaluation $PT Eval Low Complexity: 1 Procedure     PT G CodesKenyon Ana 05/20/2015, 4:23 PM

## 2015-05-17 NOTE — Anesthesia Postprocedure Evaluation (Signed)
Anesthesia Post Note  Patient: Rodney [name]  Procedure(s) Performed: Procedure(s) (LRB): OPEN REDUCTION INTERNAL FIXATION (ORIF) ANKLE FRACTURE (Left)  Patient location during evaluation: PACU Anesthesia Type: General and Regional Level of consciousness: awake and alert Pain management: pain level controlled Vital Signs Assessment: post-procedure vital signs reviewed and stable Respiratory status: spontaneous breathing, nonlabored ventilation, respiratory function stable and patient connected to nasal cannula oxygen Cardiovascular status: blood pressure returned to baseline and stable Postop Assessment: no signs of nausea or vomiting Anesthetic complications: no    Last Vitals:  Filed Vitals:   05/17/15 1038 05/17/15 1045  BP:    Pulse: 70 71  Temp:    Resp: 11 10    Last Pain:  Filed Vitals:   05/17/15 1047  PainSc: 4     LLE Motor Response: No movement due to regional block (05/17/15 1045) LLE Sensation: Decreased (due to block) (05/17/15 1045) RLE Motor Response: Responds to commands (05/17/15 1045) RLE Sensation: No numbness;No tingling (05/17/15 1045)      Burch Marchuk S

## 2015-05-17 NOTE — Anesthesia Procedure Notes (Addendum)
Anesthesia Regional Block:  Popliteal block  Pre-Anesthetic Checklist: ,, timeout performed, Correct Patient, Correct Site, Correct Laterality, Correct Procedure, Correct Position, site marked, Risks and benefits discussed,  Surgical consent,  Pre-op evaluation,  At surgeon's request and post-op pain management  Laterality: Left  Prep: chloraprep       Needles:  Injection technique: Single-shot  Needle Type: Echogenic Stimulator Needle     Needle Length: 9cm 9 cm Needle Gauge: 21 and 21 G    Additional Needles:  Procedures: ultrasound guided (picture in chart) Popliteal block Narrative:  Injection made incrementally with aspirations every 5 mL.  Performed by: Personally  Anesthesiologist: ROSE, Iona Beard  Additional Notes: Patient tolerated the procedure well without complications   Procedure Name: LMA Insertion Date/Time: 05/17/2015 8:02 AM Performed by: Dione Booze Pre-anesthesia Checklist: Patient identified, Emergency Drugs available, Suction available and Patient being monitored Patient Re-evaluated:Patient Re-evaluated prior to inductionOxygen Delivery Method: Circle system utilized Preoxygenation: Pre-oxygenation with 100% oxygen Intubation Type: IV induction LMA: LMA inserted LMA Size: 4.0 Number of attempts: 1 Placement Confirmation: positive ETCO2 Tube secured with: Tape Dental Injury: Teeth and Oropharynx as per pre-operative assessment

## 2015-05-17 NOTE — Progress Notes (Signed)
Per PT report & Dr Randel Pigg instructions, discontinuing discharge order. Monae Topping, [employer]

## 2015-05-17 NOTE — Brief Op Note (Signed)
05/16/2015 - 05/17/2015  10:31 AM  PATIENT:  Rodney [name]  75 y.o. male  PRE-OPERATIVE DIAGNOSIS:  left ankle fracture  POST-OPERATIVE DIAGNOSIS:  left ankle fracture  PROCEDURE:  Procedure(s): OPEN REDUCTION INTERNAL FIXATION (ORIF) ANKLE FRACTURE  SURGEON:  Surgeon(s): Meredith Pel, MD  ASSISTANT: none    ANESTHESIA:   general  EBL: 50 ml    Total I/O In: 1000 [I.V.:1000] Out: 150 [Blood:150]  BLOOD ADMINISTERED: none  DRAINS: none   LOCAL MEDICATIONS USED:  none  SPECIMEN:  No Specimen  COUNTS:  YES  TOURNIQUET:   Total Tourniquet Time Documented: area (Left) - 54 minutes Total: area (Left) - 54 minutes   DICTATION: .Other Dictation: Dictation Number [id number] PLAN OF CARE: Discharge to home after PACU  PATIENT DISPOSITION:  PACU - hemodynamically stable

## 2015-05-17 NOTE — Transfer of Care (Signed)
Immediate Anesthesia Transfer of Care Note  Patient: Rodney [name] [facility]  Procedure(s) Performed: Procedure(s): OPEN REDUCTION INTERNAL FIXATION (ORIF) ANKLE FRACTURE (Left)  Patient Location: PACU  Anesthesia Type:General and Regional  Level of Consciousness: awake, alert , oriented and patient cooperative  Airway & Oxygen Therapy: Patient Spontanous Breathing and Patient connected to face mask oxygen  Post-op Assessment: Report given to RN and Post -op Vital signs reviewed and stable  Post vital signs: Reviewed and stable  Last Vitals:  Filed Vitals:   05/17/15 0225 05/17/15 0531  BP: 119/67 131/73  Pulse: 84 84  Temp: 36.7 C 36.7 C  Resp: 16 16    Complications: No apparent anesthesia complications

## 2015-05-18 ENCOUNTER — Encounter (HOSPITAL_COMMUNITY): Payer: Self-pay | Admitting: Orthopedic Surgery

## 2015-05-18 NOTE — Progress Notes (Signed)
Physical Therapy Treatment Patient Details Name: BRYLAN LYNG MRN: [id number] DOB: 1940/07/06 Today's Date: 05/18/2015    History of Present Illness 75 yo male adm after fall resulting in L trimalleolar ankle fx, s/p ORIF per Dr. Marlou Sa 05/17/15    PT Comments    POD # 1 With son and daughter present instructed on proper tech to assist pt up 4 steps side by side "[employer]" with pt's arms around 2 persons as he jumps up one step at a time.    Follow Up Recommendations  Home health PT;Supervision - Intermittent     Equipment Recommendations  Rolling walker with 5" wheels;Other (comment) (knee scooter)    Recommendations for Other Services       Precautions / Restrictions Precautions Precautions: Fall Restrictions Weight Bearing Restrictions: Yes LLE Weight Bearing: Non weight bearing    Mobility  Bed Mobility  Transfers  Ambulation/Gait Ambulation/Gait assistance: Min assist;Mod assist;Max assist Ambulation Distance (Feet): 8 Feet Assistive device: Rolling walker (2 wheeled);Crutches;None (knee scooter)       General Gait Details: amb with B crutches.  Performed poorly, very unsteady and near fall.  Amb with knee scooter, pt did better with 25% VC's on safety esp with stairs.  Amb with RW pt did okay, mild unsteady with C/O B UE weakness.  Pt preferred scooter.  Did send him home with crutches and walker for different settings and time frame of healing.     Stairs Stairs: Yes Stairs assistance: Max assist Stair Management: Forward Number of Stairs: 2 General stair comments: + 2 assist both sides having pt support self over therapist shoulders basically jumping self up one step at a time while NWB either.  Pt was unable to perform forward with B crutches and unable to bump up backward with RW.   Performed with son and daughter present Wheelchair Mobility    Modified Rankin (Stroke Patients Only)       Balance                                     Cognition Arousal/Alertness: Awake/alert Behavior During Therapy: WFL for tasks assessed/performed Overall Cognitive Status: Within Functional Limits for tasks assessed                      Exercises      General Comments        Pertinent Vitals/Pain Pain Assessment: 0-10 Pain Score: 7  Pain Location: L ankle/foot after activity Pain Descriptors / Indicators: Aching;Grimacing;Throbbing;Tightness Pain Intervention(s): Monitored during session;Premedicated before session;Repositioned;Ice applied    Home Living                      Prior Function            PT Goals (current goals can now be found in the care plan section) Progress towards PT goals: Progressing toward goals    Frequency  Min 6X/week    PT Plan Current plan remains appropriate    Co-evaluation             End of Session Equipment Utilized During Treatment: Gait belt Activity Tolerance: Patient limited by fatigue Patient left: in bed;with call bell/phone within reach;with family/visitor present     Time: 1150-1200 PT Time Calculation (min) (ACUTE ONLY): 10 min  Charges:  $Self Care/Home Management: 8-22  G Codes:      Rica Koyanagi  PTA [facility]  Acute  Rehab Pager      [phone]

## 2015-05-18 NOTE — Progress Notes (Signed)
Dr Marlou Sa Notified  Of need for face to face and HHPT order  D  Mateo Flow RN

## 2015-05-18 NOTE — Progress Notes (Signed)
Physical Therapy Treatment Patient Details Name: Rodney [name] MRN: [id number] DOB: July 03, 1940 Today's Date: 05/18/2015    History of Present Illness 75 yo male adm after fall resulting in L trimalleolar ankle fx, s/p ORIF per Dr. Marlou Sa 05/17/15    PT Comments    POD # 1 Pt aware he is NWB until his F/U applt with Ortho.  Pt instructed on elevation and use of ICE.  Assisted OOB and amb with 3 different AD.  (see below)  Practiced stairs.  Will need to educate family on stairs when they arrive.   Follow Up Recommendations  Home health PT;Supervision - Intermittent     Equipment Recommendations  Rolling walker with 5" wheels;Other (comment) (knee scooter)    Recommendations for Other Services       Precautions / Restrictions Precautions Precautions: Fall Restrictions Weight Bearing Restrictions: Yes LLE Weight Bearing: Non weight bearing    Mobility  Bed Mobility Overal bed mobility: Needs Assistance Bed Mobility: Supine to Sit     Supine to sit: Min assist;Min guard     General bed mobility comments: min/guard to bring trunk forward ans assist to support L LE  Transfers Overall transfer level: Needs assistance Equipment used: Rolling walker (2 wheeled);None (knee scooter) Transfers: Sit to/from Stand Sit to Stand: Min assist         General transfer comment: 50% VC's on safety with turns and hand placement.    Ambulation/Gait Ambulation/Gait assistance: Min assist;Mod assist;Max assist Ambulation Distance (Feet): 8 Feet Assistive device: Rolling walker (2 wheeled);Crutches;None (knee scooter)       General Gait Details: amb with B crutches.  Performed poorly, very unsteady and near fall.  Amb with knee scooter, pt did better with 25% VC's on safety esp with stairs.  Amb with RW pt did okay, mild unsteady with C/O B UE weakness.  Pt preferred scooter.  Did send him home with crutches and walker for different settings and time frame of healing.      Stairs Stairs: Yes Stairs assistance: Max assist Stair Management: Backwards Number of Stairs: 2 General stair comments: + 2 assist both sides having pt support self over therapist shoulders basically jumping self up one step at a time while NWB either.  Pt was unable to perform forward with B crutches and unable to bump up backward with RW.    Wheelchair Mobility    Modified Rankin (Stroke Patients Only)       Balance                                    Cognition Arousal/Alertness: Awake/alert Behavior During Therapy: WFL for tasks assessed/performed Overall Cognitive Status: Within Functional Limits for tasks assessed                      Exercises      General Comments        Pertinent Vitals/Pain Pain Assessment: 0-10 Pain Score: 7  Pain Location: L ankle/foot after activity Pain Descriptors / Indicators: Aching;Grimacing;Throbbing;Tightness Pain Intervention(s): Monitored during session;Premedicated before session;Repositioned;Ice applied    Home Living                      Prior Function            PT Goals (current goals can now be found in the care plan section) Progress towards PT goals: Progressing toward goals  Frequency  Min 6X/week    PT Plan Current plan remains appropriate    Co-evaluation             End of Session Equipment Utilized During Treatment: Gait belt Activity Tolerance: Patient limited by fatigue Patient left: in bed;with call bell/phone within reach;with family/visitor present     Time: [id number] PT Time Calculation (min) (ACUTE ONLY): 45 min  Charges:  $Gait Training: 23-37 mins $Therapeutic Activity: 8-22 mins                    G Codes:      Rica Koyanagi  PTA [facility]  Acute  Rehab Pager      [phone]

## 2015-05-18 NOTE — Progress Notes (Signed)
1215 discharge instructions given to patient and family  D Clinical research associate

## 2015-05-18 NOTE — Progress Notes (Signed)
Patient stable Foot dorsiflexion plantar flexion intact at the toes Plan discharge today after physical therapy Home on Percocet and aspirin for DVT prophylaxis

## 2015-05-19 DIAGNOSIS — N183 Chronic kidney disease, stage 3 (moderate): Secondary | ICD-10-CM | POA: Diagnosis not present

## 2015-05-19 DIAGNOSIS — I129 Hypertensive chronic kidney disease with stage 1 through stage 4 chronic kidney disease, or unspecified chronic kidney disease: Secondary | ICD-10-CM | POA: Diagnosis not present

## 2015-05-19 DIAGNOSIS — E1122 Type 2 diabetes mellitus with diabetic chronic kidney disease: Secondary | ICD-10-CM | POA: Diagnosis not present

## 2015-05-19 DIAGNOSIS — W19XXXD Unspecified fall, subsequent encounter: Secondary | ICD-10-CM | POA: Diagnosis not present

## 2015-05-19 DIAGNOSIS — Z9181 History of falling: Secondary | ICD-10-CM | POA: Diagnosis not present

## 2015-05-21 DIAGNOSIS — E1122 Type 2 diabetes mellitus with diabetic chronic kidney disease: Secondary | ICD-10-CM | POA: Diagnosis not present

## 2015-05-21 DIAGNOSIS — W19XXXD Unspecified fall, subsequent encounter: Secondary | ICD-10-CM | POA: Diagnosis not present

## 2015-05-21 DIAGNOSIS — Z9181 History of falling: Secondary | ICD-10-CM | POA: Diagnosis not present

## 2015-05-21 DIAGNOSIS — I129 Hypertensive chronic kidney disease with stage 1 through stage 4 chronic kidney disease, or unspecified chronic kidney disease: Secondary | ICD-10-CM | POA: Diagnosis not present

## 2015-05-21 DIAGNOSIS — N183 Chronic kidney disease, stage 3 (moderate): Secondary | ICD-10-CM | POA: Diagnosis not present

## 2015-05-21 NOTE — Discharge Summary (Signed)
Physician Discharge Summary  Patient ID: Rodney [name] MRN: [id number] DOB/AGE: December 24, 1940 75 y.o.  Admit date: 05/16/2015 Discharge date: 05/18/2015  Admission Diagnoses:  Active Problems:   Ankle fracture   Discharge Diagnoses:  Same  Surgeries: Procedure(s): OPEN REDUCTION INTERNAL FIXATION (ORIF) ANKLE FRACTURE on 05/16/2015 - 05/17/2015   Consultants:    Discharged Condition: Stable  Hospital Course: Rodney [name] is an 75 y.o. male who was admitted 05/16/2015 with a chief complaint of  Chief Complaint  Patient presents with  . Fall  . Leg Injury  , and found to have a diagnosis of Trimalleolar ankle fracture.  They were brought to the operating room on 05/16/2015 - 05/17/2015 and underwent the above named procedures.  Patient tolerated procedure well without immediate complications he mobilizes physical therapy and was functionally mobile by postoperative day #1.  Discharge home in good condition nonweightbearing on the affected extremity in a splint.  Antibiotics given:  Anti-infectives    Start     Dose/Rate Route Frequency Ordered Stop   05/17/15 0745  ceFAZolin (ANCEF) IVPB 2 g/50 mL premix     2 g 100 mL/hr over 30 Minutes Intravenous  Once 05/17/15 0744 05/17/15 0814    .  Recent vital signs:  Filed Vitals:   05/18/15 0639 05/18/15 1030  BP: 108/68 111/57  Pulse: 96 97  Temp: 98.5 F (36.9 C) 97.5 F (36.4 C)  Resp: 16 16    Recent laboratory studies:  Results for orders placed or performed during the hospital encounter of 05/16/15  Surgical PCR screen  Result Value Ref Range   MRSA, PCR NEGATIVE NEGATIVE   Staphylococcus aureus NEGATIVE NEGATIVE  Basic metabolic panel  Result Value Ref Range   Sodium 140 135 - 145 mmol/L   Potassium 4.2 3.5 - 5.1 mmol/L   Chloride 111 101 - 111 mmol/L   CO2 23 22 - 32 mmol/L   Glucose, Bld 119 (H) 65 - 99 mg/dL   BUN 28 (H) 6 - 20 mg/dL   Creatinine, Ser 1.66 (H) 0.61 - 1.24 mg/dL   Calcium 9.1 8.9 -  10.3 mg/dL   GFR calc non Af Amer 39 (L) >60 mL/min   GFR calc Af Amer 45 (L) >60 mL/min   Anion gap 6 5 - 15  CBC with Differential  Result Value Ref Range   WBC 5.1 4.0 - 10.5 K/uL   RBC 3.17 (L) 4.22 - 5.81 MIL/uL   Hemoglobin 10.5 (L) 13.0 - 17.0 g/dL   HCT 32.0 (L) 39.0 - 52.0 %   MCV 100.9 (H) 78.0 - 100.0 fL   MCH 33.1 26.0 - 34.0 pg   MCHC 32.8 30.0 - 36.0 g/dL   RDW 14.8 11.5 - 15.5 %   Platelets 211 150 - 400 K/uL   Neutrophils Relative % 80 %   Neutro Abs 4.1 1.7 - 7.7 K/uL   Lymphocytes Relative 10 %   Lymphs Abs 0.5 (L) 0.7 - 4.0 K/uL   Monocytes Relative 8 %   Monocytes Absolute 0.4 0.1 - 1.0 K/uL   Eosinophils Relative 2 %   Eosinophils Absolute 0.1 0.0 - 0.7 K/uL   Basophils Relative 0 %   Basophils Absolute 0.0 0.0 - 0.1 K/uL  CBC  Result Value Ref Range   WBC 4.3 4.0 - 10.5 K/uL   RBC 2.98 (L) 4.22 - 5.81 MIL/uL   Hemoglobin 9.9 (L) 13.0 - 17.0 g/dL   HCT 30.1 (L) 39.0 - 52.0 %  MCV 101.0 (H) 78.0 - 100.0 fL   MCH 33.2 26.0 - 34.0 pg   MCHC 32.9 30.0 - 36.0 g/dL   RDW 14.9 11.5 - 15.5 %   Platelets 185 150 - 400 K/uL  Comprehensive metabolic panel  Result Value Ref Range   Sodium 142 135 - 145 mmol/L   Potassium 3.8 3.5 - 5.1 mmol/L   Chloride 114 (H) 101 - 111 mmol/L   CO2 23 22 - 32 mmol/L   Glucose, Bld 130 (H) 65 - 99 mg/dL   BUN 26 (H) 6 - 20 mg/dL   Creatinine, Ser 1.72 (H) 0.61 - 1.24 mg/dL   Calcium 8.5 (L) 8.9 - 10.3 mg/dL   Total Protein 6.2 (L) 6.5 - 8.1 g/dL   Albumin 3.6 3.5 - 5.0 g/dL   AST 19 15 - 41 U/L   ALT 16 (L) 17 - 63 U/L   Alkaline Phosphatase 53 38 - 126 U/L   Total Bilirubin 0.6 0.3 - 1.2 mg/dL   GFR calc non Af Amer 37 (L) >60 mL/min   GFR calc Af Amer 43 (L) >60 mL/min   Anion gap 5 5 - 15  Glucose, capillary  Result Value Ref Range   Glucose-Capillary 109 (H) 65 - 99 mg/dL    Discharge Medications:     Medication List    STOP taking these medications        ibuprofen 200 MG tablet  Commonly known as:   ADVIL,MOTRIN      TAKE these medications        aspirin EC 325 MG tablet  Take 1 tablet (325 mg total) by mouth daily.     atorvastatin 20 MG tablet  Commonly known as:  LIPITOR  Take 1 tablet (20 mg total) by mouth daily.     BENICAR 20 MG tablet  Generic drug:  olmesartan  Take 1 tablet (20 mg total) by mouth daily.     dorzolamide-timolol 22.3-6.8 MG/ML ophthalmic solution  Commonly known as:  COSOPT  Place 1 drop into both eyes every morning.     glucose blood test strip  Commonly known as:  ACCU-CHEK AVIVA  1 each by Other route 2 (two) times daily.     latanoprost 0.005 % ophthalmic solution  Commonly known as:  XALATAN  Place 1 drop into both eyes at bedtime.     oxyCODONE-acetaminophen 5-325 MG tablet  Commonly known as:  ROXICET  Take 1-2 tablets by mouth every 6 (six) hours as needed for severe pain.     pioglitazone 45 MG tablet  Commonly known as:  ACTOS  Take 1 tablet (45 mg total) by mouth daily.     saxagliptin HCl 2.5 MG Tabs tablet  Commonly known as:  ONGLYZA  Take 1 tablet (2.5 mg total) by mouth daily.        Diagnostic Studies: Dg Ankle 2 Views Left  05/17/2015  CLINICAL DATA:  Ankle fracture.  22 seconds of fluoroscopy time. EXAM: DG C-ARM 1-60 MIN-NO REPORT; LEFT ANKLE - 2 VIEW COMPARISON:  05/16/2015 FINDINGS: Two views are submitted, demonstrating cortical screws traversing the medial malleolus. A screw plate transfixes the lateral malleolus. Posterior malleolar fracture is suspected. IMPRESSION: Status post ORIF of ankle fracture. Electronically Signed   By: Nolon Nations M.D.   On: 05/17/2015 11:14   Dg Ankle Complete Left  05/16/2015  CLINICAL DATA:  Fall with left ankle deformity EXAM: LEFT ANKLE COMPLETE - 3+ VIEW COMPARISON:  None. FINDINGS: Transverse medial  malleolus fracture with 11 mm lateral displacement of the medial malleolus fracture fragment. Oblique lateral malleolus fracture with 13 mm lateral displacement of the distal  fracture fragment. 9 mm lateral subluxation of the talus relative to the distal tibial plafond. Probable nondisplaced posterior malleolus fracture in the left distal tibia. Diffuse soft tissue swelling. Tiny Achilles and plantar left calcaneal spurs. IMPRESSION: Trimalleolar left ankle fracture as described. Lateral 9 mm talar subluxation. Electronically Signed   By: Ilona Sorrel M.D.   On: 05/16/2015 18:31   Chest Portable 1 View  05/17/2015  CLINICAL DATA:  Left ankle fracture.  Pre-op respiratory exam EXAM: PORTABLE CHEST 1 VIEW COMPARISON:  08/18/2005 FINDINGS: The heart size and mediastinal contours are within normal limits. Both lungs are clear. The visualized skeletal structures are unremarkable. IMPRESSION: No active disease. Electronically Signed   By: Earle Gell M.D.   On: 05/17/2015 08:21   Dg C-arm 1-60 Min-no Report  05/17/2015  CLINICAL DATA: ankle fracture C-ARM 1-60 MINUTES Fluoroscopy was utilized by the requesting physician.  No radiographic interpretation.    Disposition: 06-Home-Health Care Svc      Discharge Instructions    Call MD / Call 911    Complete by:  As directed   If you experience chest pain or shortness of breath, CALL 911 and be transported to the hospital emergency room.  If you develope a fever above 101 F, pus (white drainage) or increased drainage or redness at the wound, or calf pain, call your surgeon's office.     Call MD / Call 911    Complete by:  As directed   If you experience chest pain or shortness of breath, CALL 911 and be transported to the hospital emergency room.  If you develope a fever above 101 F, pus (white drainage) or increased drainage or redness at the wound, or calf pain, call your surgeon's office.     Constipation Prevention    Complete by:  As directed   Drink plenty of fluids.  Prune juice may be helpful.  You may use a stool softener, such as Colace (over the counter) 100 mg twice a day.  Use MiraLax (over the counter) for  constipation as needed.     Constipation Prevention    Complete by:  As directed   Drink plenty of fluids.  Prune juice may be helpful.  You may use a stool softener, such as Colace (over the counter) 100 mg twice a day.  Use MiraLax (over the counter) for constipation as needed.     Diet - low sodium heart healthy    Complete by:  As directed      Diet - low sodium heart healthy    Complete by:  As directed      Discharge instructions    Complete by:  As directed   Nonweightbearing left leg Elevate foot Keep splint dry Okay to get around on the knee scooter     Increase activity slowly as tolerated    Complete by:  As directed      Increase activity slowly as tolerated    Complete by:  As directed      Non weight bearing    Complete by:  As directed            Follow-up Information    Follow up with [facility].   Why:  Physical therapy   Contact information:   [address] [place] [address] [phone]  Follow up with [facility].   Why:  walker, knee walker   Contact information:   [place] [address] [phone]        Signed: Meredith Pel 05/21/2015, 12:32 PM

## 2015-05-25 DIAGNOSIS — E1122 Type 2 diabetes mellitus with diabetic chronic kidney disease: Secondary | ICD-10-CM | POA: Diagnosis not present

## 2015-05-25 DIAGNOSIS — Z9181 History of falling: Secondary | ICD-10-CM | POA: Diagnosis not present

## 2015-05-25 DIAGNOSIS — W19XXXD Unspecified fall, subsequent encounter: Secondary | ICD-10-CM | POA: Diagnosis not present

## 2015-05-25 DIAGNOSIS — I129 Hypertensive chronic kidney disease with stage 1 through stage 4 chronic kidney disease, or unspecified chronic kidney disease: Secondary | ICD-10-CM | POA: Diagnosis not present

## 2015-05-25 DIAGNOSIS — S82852D Displaced trimalleolar fracture of left lower leg, subsequent encounter for closed fracture with routine healing: Secondary | ICD-10-CM | POA: Diagnosis not present

## 2015-05-25 DIAGNOSIS — N183 Chronic kidney disease, stage 3 (moderate): Secondary | ICD-10-CM | POA: Diagnosis not present

## 2015-05-25 DIAGNOSIS — S82852A Displaced trimalleolar fracture of left lower leg, initial encounter for closed fracture: Secondary | ICD-10-CM | POA: Diagnosis not present

## 2015-05-27 DIAGNOSIS — E1122 Type 2 diabetes mellitus with diabetic chronic kidney disease: Secondary | ICD-10-CM | POA: Diagnosis not present

## 2015-05-27 DIAGNOSIS — W19XXXD Unspecified fall, subsequent encounter: Secondary | ICD-10-CM | POA: Diagnosis not present

## 2015-05-27 DIAGNOSIS — I129 Hypertensive chronic kidney disease with stage 1 through stage 4 chronic kidney disease, or unspecified chronic kidney disease: Secondary | ICD-10-CM | POA: Diagnosis not present

## 2015-05-27 DIAGNOSIS — N183 Chronic kidney disease, stage 3 (moderate): Secondary | ICD-10-CM | POA: Diagnosis not present

## 2015-05-27 DIAGNOSIS — Z9181 History of falling: Secondary | ICD-10-CM | POA: Diagnosis not present

## 2015-06-01 DIAGNOSIS — I129 Hypertensive chronic kidney disease with stage 1 through stage 4 chronic kidney disease, or unspecified chronic kidney disease: Secondary | ICD-10-CM | POA: Diagnosis not present

## 2015-06-01 DIAGNOSIS — W19XXXD Unspecified fall, subsequent encounter: Secondary | ICD-10-CM | POA: Diagnosis not present

## 2015-06-01 DIAGNOSIS — N183 Chronic kidney disease, stage 3 (moderate): Secondary | ICD-10-CM | POA: Diagnosis not present

## 2015-06-01 DIAGNOSIS — E1122 Type 2 diabetes mellitus with diabetic chronic kidney disease: Secondary | ICD-10-CM | POA: Diagnosis not present

## 2015-06-01 DIAGNOSIS — Z9181 History of falling: Secondary | ICD-10-CM | POA: Diagnosis not present

## 2015-06-04 DIAGNOSIS — N183 Chronic kidney disease, stage 3 (moderate): Secondary | ICD-10-CM | POA: Diagnosis not present

## 2015-06-04 DIAGNOSIS — E1122 Type 2 diabetes mellitus with diabetic chronic kidney disease: Secondary | ICD-10-CM | POA: Diagnosis not present

## 2015-06-04 DIAGNOSIS — Z9181 History of falling: Secondary | ICD-10-CM | POA: Diagnosis not present

## 2015-06-04 DIAGNOSIS — W19XXXD Unspecified fall, subsequent encounter: Secondary | ICD-10-CM | POA: Diagnosis not present

## 2015-06-04 DIAGNOSIS — I129 Hypertensive chronic kidney disease with stage 1 through stage 4 chronic kidney disease, or unspecified chronic kidney disease: Secondary | ICD-10-CM | POA: Diagnosis not present

## 2015-06-09 DIAGNOSIS — I129 Hypertensive chronic kidney disease with stage 1 through stage 4 chronic kidney disease, or unspecified chronic kidney disease: Secondary | ICD-10-CM | POA: Diagnosis not present

## 2015-06-09 DIAGNOSIS — Z9181 History of falling: Secondary | ICD-10-CM | POA: Diagnosis not present

## 2015-06-09 DIAGNOSIS — W19XXXD Unspecified fall, subsequent encounter: Secondary | ICD-10-CM | POA: Diagnosis not present

## 2015-06-09 DIAGNOSIS — N183 Chronic kidney disease, stage 3 (moderate): Secondary | ICD-10-CM | POA: Diagnosis not present

## 2015-06-09 DIAGNOSIS — E1122 Type 2 diabetes mellitus with diabetic chronic kidney disease: Secondary | ICD-10-CM | POA: Diagnosis not present

## 2015-06-10 DIAGNOSIS — W19XXXD Unspecified fall, subsequent encounter: Secondary | ICD-10-CM | POA: Diagnosis not present

## 2015-06-10 DIAGNOSIS — I129 Hypertensive chronic kidney disease with stage 1 through stage 4 chronic kidney disease, or unspecified chronic kidney disease: Secondary | ICD-10-CM | POA: Diagnosis not present

## 2015-06-10 DIAGNOSIS — Z9181 History of falling: Secondary | ICD-10-CM | POA: Diagnosis not present

## 2015-06-10 DIAGNOSIS — N183 Chronic kidney disease, stage 3 (moderate): Secondary | ICD-10-CM | POA: Diagnosis not present

## 2015-06-10 DIAGNOSIS — E1122 Type 2 diabetes mellitus with diabetic chronic kidney disease: Secondary | ICD-10-CM | POA: Diagnosis not present

## 2015-06-15 DIAGNOSIS — S82852S Displaced trimalleolar fracture of left lower leg, sequela: Secondary | ICD-10-CM | POA: Diagnosis not present

## 2015-08-17 ENCOUNTER — Other Ambulatory Visit: Payer: Self-pay | Admitting: Internal Medicine

## 2015-09-01 ENCOUNTER — Other Ambulatory Visit (INDEPENDENT_AMBULATORY_CARE_PROVIDER_SITE_OTHER): Payer: Medicare Other

## 2015-09-01 DIAGNOSIS — Z0000 Encounter for general adult medical examination without abnormal findings: Secondary | ICD-10-CM | POA: Diagnosis not present

## 2015-09-01 LAB — BASIC METABOLIC PANEL
BUN: 26 mg/dL — AB (ref 6–23)
CHLORIDE: 106 meq/L (ref 96–112)
CO2: 25 mEq/L (ref 19–32)
Calcium: 9.2 mg/dL (ref 8.4–10.5)
Creatinine, Ser: 1.63 mg/dL — ABNORMAL HIGH (ref 0.40–1.50)
GFR: 44.03 mL/min — ABNORMAL LOW (ref >60.00)
GLUCOSE: 110 mg/dL — AB (ref 70–99)
POTASSIUM: 4.5 meq/L (ref 3.5–5.1)
Sodium: 139 mEq/L (ref 135–145)

## 2015-09-01 LAB — CBC WITH DIFFERENTIAL/PLATELET
BASOS PCT: 0.3 % (ref 0.0–3.0)
Basophils Absolute: 0 10*3/uL (ref 0.0–0.1)
EOS PCT: 3.3 % (ref 0.0–5.0)
Eosinophils Absolute: 0.1 10*3/uL (ref 0.0–0.7)
HCT: 33.5 % — ABNORMAL LOW (ref 39.0–52.0)
HEMOGLOBIN: 11.6 g/dL — AB (ref 13.0–17.0)
Lymphocytes Relative: 15.4 % (ref 12.0–46.0)
Lymphs Abs: 0.6 10*3/uL — ABNORMAL LOW (ref 0.7–4.0)
MCHC: 34.5 g/dL (ref 30.0–36.0)
MCV: 96.4 fl (ref 78.0–100.0)
MONO ABS: 0.3 10*3/uL (ref 0.1–1.0)
Monocytes Relative: 8.5 % (ref 3.0–12.0)
Neutro Abs: 3 10*3/uL (ref 1.4–7.7)
Neutrophils Relative %: 72.5 % (ref 43.0–77.0)
Platelets: 183 10*3/uL (ref 150.0–400.0)
RBC: 3.48 Mil/uL — ABNORMAL LOW (ref 4.22–5.81)
RDW: 15.8 % — AB (ref 11.5–15.5)
WBC: 4.1 10*3/uL (ref 4.0–10.5)

## 2015-09-01 LAB — POC URINALSYSI DIPSTICK (AUTOMATED)
Bilirubin, UA: NEGATIVE
Blood, UA: NEGATIVE
Glucose, UA: NEGATIVE
Ketones, UA: NEGATIVE
LEUKOCYTES UA: NEGATIVE
NITRITE UA: NEGATIVE
PH UA: 6
PROTEIN UA: NEGATIVE
Spec Grav, UA: 1.015
UROBILINOGEN UA: 0.2

## 2015-09-01 LAB — HEMOGLOBIN A1C: HEMOGLOBIN A1C: 6 % (ref 4.6–6.5)

## 2015-09-01 LAB — HEPATIC FUNCTION PANEL
ALBUMIN: 4.1 g/dL (ref 3.5–5.2)
ALT: 10 U/L (ref 0–53)
AST: 14 U/L (ref 0–37)
Alkaline Phosphatase: 66 U/L (ref 39–117)
BILIRUBIN TOTAL: 0.8 mg/dL (ref 0.2–1.2)
Bilirubin, Direct: 0.2 mg/dL (ref 0.0–0.3)
TOTAL PROTEIN: 6.5 g/dL (ref 6.0–8.3)

## 2015-09-01 LAB — LIPID PANEL
CHOLESTEROL: 114 mg/dL (ref 0–200)
HDL: 20.4 mg/dL — ABNORMAL LOW (ref >39.00)
LDL CALC: 64 mg/dL (ref 0–99)
NonHDL: 94.01
Total CHOL/HDL Ratio: 6
Triglycerides: 151 mg/dL — ABNORMAL HIGH (ref 0.0–149.0)
VLDL: 30.2 mg/dL (ref 0.0–40.0)

## 2015-09-01 LAB — MICROALBUMIN / CREATININE URINE RATIO
CREATININE, U: 93.9 mg/dL
MICROALB UR: 3.4 mg/dL — AB (ref 0.0–1.9)
Microalb Creat Ratio: 3.6 mg/g (ref 0.0–30.0)

## 2015-09-01 LAB — PSA: PSA: 1.67 ng/mL (ref 0.10–4.00)

## 2015-09-01 LAB — TSH: TSH: 4.09 u[IU]/mL (ref 0.35–4.50)

## 2015-09-08 ENCOUNTER — Ambulatory Visit (INDEPENDENT_AMBULATORY_CARE_PROVIDER_SITE_OTHER): Payer: Medicare Other | Admitting: Internal Medicine

## 2015-09-08 ENCOUNTER — Encounter: Payer: Self-pay | Admitting: Internal Medicine

## 2015-09-08 VITALS — BP 130/80 | HR 88 | Temp 97.7°F | Ht 68.0 in | Wt 236.0 lb

## 2015-09-08 DIAGNOSIS — E0821 Diabetes mellitus due to underlying condition with diabetic nephropathy: Secondary | ICD-10-CM

## 2015-09-08 DIAGNOSIS — I10 Essential (primary) hypertension: Secondary | ICD-10-CM

## 2015-09-08 DIAGNOSIS — Z0000 Encounter for general adult medical examination without abnormal findings: Secondary | ICD-10-CM

## 2015-09-08 MED ORDER — GLIPIZIDE ER 2.5 MG PO TB24: 2.5000 mg | ORAL_TABLET | Freq: Every day | ORAL | Status: DC

## 2015-09-08 MED ORDER — LOSARTAN POTASSIUM 50 MG PO TABS: 50.0000 mg | ORAL_TABLET | Freq: Every day | ORAL | 3 refills | Status: DC

## 2015-09-08 MED ORDER — METFORMIN HCL ER 500 MG PO TB24: 500.0000 mg | ORAL_TABLET | Freq: Every day | ORAL | Status: DC

## 2015-09-08 NOTE — Patient Instructions (Signed)
Limit your sodium (Salt) intake   Please check your hemoglobin A1c every 3 months  Urology follow-up  Please check your blood pressure on a regular basis.  If it is consistently greater than 150/90, please make an office appointment.    It is important that you exercise regularly, at least 20 minutes 3 to 4 times per week.  If you develop chest pain or shortness of breath seek  medical attention.  You need to lose weight.  Consider a lower calorie diet and regular exercise.

## 2015-09-08 NOTE — Progress Notes (Signed)
Patient ID: Rodney [name], male   DOB: 07-Nov-1940, 75 y.o.   MRN: [id number]  Subjective:    Patient ID: Rodney [name], male    DOB: 1940/11/27, 75 y.o.   MRN: [id number]  HPI  History of Present Illness:   75   year-old patient who is seen today for a comprehensive evaluation.  Problems include type 2 diabetes, history of prostate cancer followed closely by urology. He has a history of gout, which has been stable, as well as hypertension. Is followed by ophthalmology for glaucoma. No concerns or complaints today.  He is now status post bilateral cataract extraction surgery .  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     He is also followed by nephrology do to chronic kidney disease.  His most recent GFR has improved to 44 He is scheduled to see urology next month.  PSAs continue to rise.  He has had prior radiation therapy  His diabetes has been well controlled.  He has been on metformin in the past but discontinued due to worsening chronic kidney disease Presently, he has been on Onglyza , but the cost has been prohibitive.  Since his last exam.  He has required surgery for a fracture of the left ankle.  He had follow-up colonoscopy in November 2016   Medicare wellness visit  1. Risk factors, based on past  M,S,F history. Cardiovascular risk factors include dyslipidemia diabetes hypertension  2.  Physical activities: Remains fairly active;  avid fisherman  3.  Depression/mood: No history of depression or mood disorder  4.  Hearing: No significant deficits  5.  ADL's: Independent in all aspects of daily living 6.  Fall risk: Low 7.  Home safety: No problems identified  8.  Height weight, and visual acuity; height and weight stable no change in visual acuity  9.  Counseling: We'll regular exercise modest weight loss encouraged 10. Lab orders based on risk  factors: Laboratory profile including renal function studies and lipid panel discussed hemoglobin A1c 6.6  11. Referral : We'll set up for a final colonoscopy in view of his history of colonic polyps  12. Care plan: Follow nephrology and urology. Modest weight loss encouraged  13. Cognitive assessment: Alert and oriented with normal affect  14.  Preventive services will include annual clinical exams.  He'll be followed periodically by urology, ophthalmology and nephrology.  He'll be scheduled for his final colonoscopy.  15.  Provider list update includes primary care GI urology, nephrology and ophthalmology     Preventive Screening-Counseling & Management  Alcohol-Tobacco  Smoking Status: quit   Allergies:  1) ! Ace Inhibitors   Past History:  Past Medical History:   prostate cancer  Diabetes mellitus, type II  Gout  Hypertension  ED  history of glaucoma  history incomplete Reiter's syndrome  Hyperlipidemia-low HDL cholesterol  Colonic polyps, hx of  Nephrolithiasis  Past Surgical History:   status post nerve sparing R LRP for prostate cancer, July 2007  Vasectomy  surgery for bone spur, right arm  flexible sigmoidoscopy 2003  colonoscopy November 2009   Family History:   father deceased, health unknown  mother, age 86  One brother  two sisters: one half-sister with metastatic colon cancer   Social History:   Married  two 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  HENT: Negative for congestion, dental problem, ear pain, hearing loss, mouth sores, rhinorrhea, sinus pressure, sneezing, tinnitus, trouble swallowing and voice change.   Eyes: Negative for photophobia, pain, redness and visual disturbance.  Respiratory: Negative for apnea, cough, choking, chest tightness, shortness of breath and wheezing.   Cardiovascular: Negative for chest  pain, palpitations and leg swelling.  Gastrointestinal: Negative for abdominal distention, abdominal pain, anal bleeding, blood in stool, constipation, diarrhea, nausea, rectal pain and vomiting.  Genitourinary: Negative for decreased urine volume, difficulty urinating, discharge, dysuria, flank pain, frequency, genital sores, hematuria, penile swelling, scrotal swelling, testicular pain and urgency.  Musculoskeletal: Negative for arthralgias, back pain, gait problem, joint swelling, myalgias, neck pain and neck stiffness.  Skin: Negative for color change, rash and wound.  Neurological: Negative for dizziness, tremors, seizures, syncope, facial asymmetry, speech difficulty, weakness, light-headedness, numbness and headaches.  Hematological: Negative for adenopathy. Does not bruise/bleed easily.  Psychiatric/Behavioral: Negative for agitation, behavioral problems, confusion, decreased concentration, dysphoric mood, hallucinations, self-injury, sleep disturbance and suicidal ideas. The patient is not nervous/anxious.        Objective:   Physical Exam  Constitutional: He appears well-developed and well-nourished.  HENT:  Head: Normocephalic and atraumatic.  Right Ear: External ear normal.  Left Ear: External ear normal.  Nose: Nose normal.  Mouth/Throat: Oropharynx is clear and moist.  Upper dentures in place   Eyes: Conjunctivae and EOM are normal. Pupils are equal, round, and reactive to light. No scleral icterus.  Neck: Normal range of motion. Neck supple. No JVD present. No thyromegaly present.  Cardiovascular: Regular rhythm, normal heart sounds and intact distal pulses.  Exam reveals no gallop and no friction rub.   No murmur heard. Dorsalis pedis pulses not easily palpable.  Posterior tibial pulses full  Pulmonary/Chest: Effort normal and breath sounds normal. He exhibits no tenderness.  Abdominal: Soft. Bowel sounds are normal. He exhibits no distension and no mass. There is no  tenderness.  Musculoskeletal: Normal range of motion. He exhibits edema. He exhibits no tenderness.  Plus 2 lower extremity edema  Lymphadenopathy:    He has no cervical adenopathy.  Neurological: He is alert. He has normal reflexes. No cranial nerve deficit. Coordination normal.  Skin: Skin is warm and dry. No rash noted.  Psychiatric: He has a normal mood and affect. His behavior is normal.          Assessment & Plan:    Preventive health examination Diabetes mellitus.  Well-controlled Chronic kidney disease.  Stable.  Follow-up nephrology Glaucoma. Stable per ophthalmology History prostate cancer Hypertension controlled Dyslipidemia History colonic polyps.Had Follow-up colonoscopy November 2016  Due to cost considerations Onglyza will be discontinued. We'll start low-dose metformin therapy and watch renal indices closely.  Will need to discontinue if GFR is less than 30.  Substitute glipizide for glimepiride Recheck 3 months  [name], MD

## 2015-09-08 NOTE — Progress Notes (Signed)
Pre visit review using our clinic review tool, if applicable. No additional management support is needed unless otherwise documented below in the visit note. 

## 2015-09-14 DIAGNOSIS — H401131 Primary open-angle glaucoma, bilateral, mild stage: Secondary | ICD-10-CM | POA: Diagnosis not present

## 2015-10-28 DIAGNOSIS — C61 Malignant neoplasm of prostate: Secondary | ICD-10-CM | POA: Diagnosis not present

## 2015-11-04 DIAGNOSIS — C61 Malignant neoplasm of prostate: Secondary | ICD-10-CM | POA: Diagnosis not present

## 2015-11-16 ENCOUNTER — Other Ambulatory Visit: Payer: Self-pay | Admitting: Internal Medicine

## 2015-11-16 MED ORDER — GLIPIZIDE ER 2.5 MG PO TB24: 2.5000 mg | ORAL_TABLET | Freq: Every day | ORAL | 1 refills | Status: DC

## 2015-11-16 MED ORDER — METFORMIN HCL ER 500 MG PO TB24: 1000.0000 mg | ORAL_TABLET | Freq: Every day | ORAL | 1 refills | Status: DC

## 2015-11-16 NOTE — Telephone Encounter (Signed)
Spoke to pt, told him Rx's were sent to pharmacy as requested. Pt verbalized understanding.

## 2015-12-09 ENCOUNTER — Ambulatory Visit: Payer: Medicare Other | Admitting: Internal Medicine

## 2015-12-22 ENCOUNTER — Ambulatory Visit: Payer: Medicare Other | Admitting: Internal Medicine

## 2016-01-06 ENCOUNTER — Encounter: Payer: Self-pay | Admitting: Internal Medicine

## 2016-01-06 ENCOUNTER — Ambulatory Visit (INDEPENDENT_AMBULATORY_CARE_PROVIDER_SITE_OTHER): Payer: Medicare Other | Admitting: Internal Medicine

## 2016-01-06 VITALS — BP 148/76 | HR 78 | Temp 97.6°F | Ht 68.0 in | Wt 235.5 lb

## 2016-01-06 DIAGNOSIS — E785 Hyperlipidemia, unspecified: Secondary | ICD-10-CM

## 2016-01-06 DIAGNOSIS — N183 Chronic kidney disease, stage 3 unspecified: Secondary | ICD-10-CM

## 2016-01-06 DIAGNOSIS — E0821 Diabetes mellitus due to underlying condition with diabetic nephropathy: Secondary | ICD-10-CM

## 2016-01-06 DIAGNOSIS — I10 Essential (primary) hypertension: Secondary | ICD-10-CM

## 2016-01-06 LAB — HEMOGLOBIN A1C: HEMOGLOBIN A1C: 6.2 % (ref 4.6–6.5)

## 2016-01-06 NOTE — Progress Notes (Signed)
Subjective:    Patient ID: [name], male    DOB: Feb 01, 1940, 75 y.o.   MRN: [id number]  HPI  75 year old patient who is seen today for follow-up.  He has a history of essential hypertension and diabetes with renal complications.  He has done quite well.  Remains on statin therapy. No concerns or complaints today  Lab Results  Component Value Date   HGBA1C 6.0 09/01/2015    Wt Readings from Last 3 Encounters:  01/06/16 235 lb 8 oz (106.8 kg)  09/08/15 236 lb (107 kg)  05/17/15 225 lb (102.1 kg)   BP Readings from Last 3 Encounters:  01/06/16 (!) 148/76  09/08/15 130/80  05/18/15 (!) 111/57   Past Medical History:  Diagnosis Date  . COLONIC POLYPS, HX OF 12/02/2008  . DIABETES MELLITUS, TYPE II 07/19/2007  . Erectile dysfunction   . GERD (gastroesophageal reflux disease)   . Glaucoma   . GOUT 07/19/2007  . HYPERLIPIDEMIA 12/02/2008  . HYPERTENSION 07/19/2007  . Kidney stones   . Prostate cancer ([place])   . Reiter syndrome    incomplete     Social History   Social History  . Marital status: Married    Spouse name: N/A  . Number of children: N/A  . Years of education: N/A   Occupational History  . Not on file.   Social History Main Topics  . Smoking status: Former Smoker    Types: Cigarettes    Quit date: 01/24/1978  . Smokeless tobacco: Never Used  . Alcohol use Yes     Comment: occasionally -socially  . Drug use: No  . Sexual activity: Not on file     Comment: 2 childern   Other Topics Concern  . Not on file   Social History Narrative  . No narrative on file    Past Surgical History:  Procedure Laterality Date  . bladder neck dilation    . CATARACT EXTRACTION     both eyes- 2015  . COLONOSCOPY    . ELBOW SURGERY     bone spur - right  . ORIF ANKLE FRACTURE Left 05/17/2015   Procedure: OPEN REDUCTION INTERNAL FIXATION (ORIF) ANKLE FRACTURE;  Surgeon: Meredith Pel, MD;  Location: [facility];  Service: Orthopedics;  Laterality: Left;  .  PROSTATECTOMY    . VASECTOMY      Family History  Problem Relation Age of Onset  . Cancer Sister     metastatic colon ca  . Colon cancer Neg Hx   . Esophageal cancer Neg Hx   . Rectal cancer Neg Hx   . Stomach cancer Neg Hx     Allergies  Allergen Reactions  . Ace Inhibitors     Tongue swelling. Reaction to benazepril only.     Current Outpatient Prescriptions on File Prior to Visit  Medication Sig Dispense Refill  . aspirin EC 325 MG tablet Take 1 tablet (325 mg total) by mouth daily. 30 tablet 0  . atorvastatin (LIPITOR) 20 MG tablet Take 1 tablet (20 mg total) by mouth daily. 90 tablet 1  . dorzolamide-timolol (COSOPT) 22.3-6.8 MG/ML ophthalmic solution Place 1 drop into both eyes every morning.     Marland Kitchen glipiZIDE (GLIPIZIDE XL) 2.5 MG 24 hr tablet Take 1 tablet (2.5 mg total) by mouth daily with breakfast. 90 tablet 1  . glucose blood (ACCU-CHEK AVIVA) test strip 1 each by Other route 2 (two) times daily. 100 each 3  . latanoprost (XALATAN) 0.005 % ophthalmic solution  Place 1 drop into both eyes at bedtime.     Marland Kitchen losartan (COZAAR) 50 MG tablet Take 1 tablet (50 mg total) by mouth daily. 90 tablet 3  . metFORMIN (GLUCOPHAGE XR) 500 MG 24 hr tablet Take 2 tablets (1,000 mg total) by mouth daily with breakfast. 180 tablet 1  . oxyCODONE-acetaminophen (ROXICET) 5-325 MG tablet Take 1-2 tablets by mouth every 6 (six) hours as needed for severe pain. 60 tablet 0  . pioglitazone (ACTOS) 45 MG tablet TAKE ONE TABLET BY MOUTH ONCE DAILY 90 tablet 1   No current facility-administered medications on file prior to visit.     BP (!) 148/76 (BP Location: Left Arm, Patient Position: Sitting, Cuff Size: Normal)   Pulse 78   Temp 97.6 F (36.4 C) (Oral)   Ht 5\' 8"  (1.727 m)   Wt 235 lb 8 oz (106.8 kg)   SpO2 98%   BMI 35.81 kg/m     Review of Systems  Constitutional: Negative for appetite change, chills, fatigue and fever.  HENT: Negative for congestion, dental problem, ear pain,  hearing loss, sore throat, tinnitus, trouble swallowing and voice change.   Eyes: Negative for pain, discharge and visual disturbance.  Respiratory: Negative for cough, chest tightness, wheezing and stridor.   Cardiovascular: Negative for chest pain, palpitations and leg swelling.  Gastrointestinal: Negative for abdominal distention, abdominal pain, blood in stool, constipation, diarrhea, nausea and vomiting.  Genitourinary: Negative for difficulty urinating, discharge, flank pain, genital sores, hematuria and urgency.  Musculoskeletal: Negative for arthralgias, back pain, gait problem, joint swelling, myalgias and neck stiffness.  Skin: Negative for rash.  Neurological: Negative for dizziness, syncope, speech difficulty, weakness, numbness and headaches.  Hematological: Negative for adenopathy. Does not bruise/bleed easily.  Psychiatric/Behavioral: Negative for behavioral problems and dysphoric mood. The patient is not nervous/anxious.        Objective:   Physical Exam  Constitutional: He is oriented to person, place, and time. He appears well-developed.  HENT:  Head: Normocephalic.  Right Ear: External ear normal.  Left Ear: External ear normal.  Eyes: Conjunctivae and EOM are normal.  Neck: Normal range of motion.  Cardiovascular: Normal rate and normal heart sounds.   Pulmonary/Chest: Breath sounds normal.  Abdominal: Bowel sounds are normal.  Musculoskeletal: Normal range of motion. He exhibits no edema or tenderness.  Neurological: He is alert and oriented to person, place, and time.  Psychiatric: He has a normal mood and affect. His behavior is normal.          Assessment & Plan:   Diabetes mellitus type 2.  Well-controlled.  No change in therapy.  Recheck hemoglobin A1c today and if remains stable.  Follow-up 6 months Essential hypertension, stable Dyslipidemia.  Continue statin therapy Chronic kidney disease, stable History of gout.  Stable  Follow-up 6  months  [name]

## 2016-01-06 NOTE — Patient Instructions (Signed)
Limit your sodium (Salt) intake   Please check your hemoglobin A1c every 6  Months    It is important that you exercise regularly, at least 20 minutes 3 to 4 times per week.  If you develop chest pain or shortness of breath seek  medical attention.  Return in 6 months for follow-up    

## 2016-01-06 NOTE — Progress Notes (Signed)
Pre visit review using our clinic review tool, if applicable. No additional management support is needed unless otherwise documented below in the visit note. 

## 2016-03-21 ENCOUNTER — Other Ambulatory Visit: Payer: Self-pay | Admitting: Internal Medicine

## 2016-05-04 DIAGNOSIS — C61 Malignant neoplasm of prostate: Secondary | ICD-10-CM | POA: Diagnosis not present

## 2016-05-04 LAB — PSA: PSA: 2.31

## 2016-05-11 DIAGNOSIS — C61 Malignant neoplasm of prostate: Secondary | ICD-10-CM | POA: Diagnosis not present

## 2016-05-17 ENCOUNTER — Other Ambulatory Visit: Payer: Self-pay | Admitting: Internal Medicine

## 2016-05-17 ENCOUNTER — Encounter: Payer: Self-pay | Admitting: Family Medicine

## 2016-06-02 ENCOUNTER — Other Ambulatory Visit: Payer: Self-pay | Admitting: Internal Medicine

## 2016-07-06 ENCOUNTER — Ambulatory Visit: Payer: Medicare Other | Admitting: Internal Medicine

## 2016-07-07 ENCOUNTER — Ambulatory Visit: Payer: Medicare Other | Admitting: Internal Medicine

## 2016-07-11 ENCOUNTER — Encounter: Payer: Self-pay | Admitting: Internal Medicine

## 2016-07-11 ENCOUNTER — Ambulatory Visit (INDEPENDENT_AMBULATORY_CARE_PROVIDER_SITE_OTHER): Payer: Medicare Other | Admitting: Internal Medicine

## 2016-07-11 VITALS — BP 144/72 | HR 72 | Temp 97.5°F | Ht 68.0 in | Wt 232.6 lb

## 2016-07-11 DIAGNOSIS — N183 Chronic kidney disease, stage 3 unspecified: Secondary | ICD-10-CM

## 2016-07-11 DIAGNOSIS — E785 Hyperlipidemia, unspecified: Secondary | ICD-10-CM | POA: Diagnosis not present

## 2016-07-11 DIAGNOSIS — E0821 Diabetes mellitus due to underlying condition with diabetic nephropathy: Secondary | ICD-10-CM

## 2016-07-11 DIAGNOSIS — I10 Essential (primary) hypertension: Secondary | ICD-10-CM | POA: Diagnosis not present

## 2016-07-11 LAB — POCT GLYCOSYLATED HEMOGLOBIN (HGB A1C): Hemoglobin A1C: 6.4

## 2016-07-11 NOTE — Progress Notes (Signed)
Subjective:    Patient ID: Rodney [name], male    DOB: 1940/07/22, 76 y.o.   MRN: [id number]  HPI  Lab Results  Component Value Date   HGBA1C 6.2 01/06/2016    BP Readings from Last 3 Encounters:  07/11/16 (!) 144/72  01/06/16 (!) 148/76  09/08/15 130/80    Wt Readings from Last 3 Encounters:  07/11/16 232 lb 9.6 oz (105.5 kg)  01/06/16 235 lb 8 oz (106.8 kg)  09/08/15 236 lb (31 kg)   76 year old patient who is seen today for follow-up of type 2 diabetes.  He has maintained excellent glycemic control with hemoglobin A1c's generally in a nondiabetic range. He is followed every 6 months by ophthalmology due to a history of glaucoma.  He has essential hypertension and dyslipidemia.  No cardiopulmonary complaints  Past Medical History:  Diagnosis Date  . COLONIC POLYPS, HX OF 12/02/2008  . DIABETES MELLITUS, TYPE II 07/19/2007  . Erectile dysfunction   . GERD (gastroesophageal reflux disease)   . Glaucoma   . GOUT 07/19/2007  . HYPERLIPIDEMIA 12/02/2008  . HYPERTENSION 07/19/2007  . Kidney stones   . Prostate cancer ([place])   . Reiter syndrome    incomplete     Social History   Social History  . Marital status: Married    Spouse name: N/A  . Number of children: N/A  . Years of education: N/A   Occupational History  . Not on file.   Social History Main Topics  . Smoking status: Former Smoker    Types: Cigarettes    Quit date: 01/24/1978  . Smokeless tobacco: Never Used  . Alcohol use Yes     Comment: occasionally -socially  . Drug use: No  . Sexual activity: Not on file     Comment: 2 childern   Other Topics Concern  . Not on file   Social History Narrative  . No narrative on file    Past Surgical History:  Procedure Laterality Date  . bladder neck dilation    . CATARACT EXTRACTION     both eyes- 2015  . COLONOSCOPY    . ELBOW SURGERY     bone spur - right  . ORIF ANKLE FRACTURE Left 05/17/2015   Procedure: OPEN REDUCTION INTERNAL FIXATION  (ORIF) ANKLE FRACTURE;  Surgeon: Meredith Pel, MD;  Location: [facility];  Service: Orthopedics;  Laterality: Left;  . PROSTATECTOMY    . VASECTOMY      Family History  Problem Relation Age of Onset  . Cancer Sister        metastatic colon ca  . Colon cancer Neg Hx   . Esophageal cancer Neg Hx   . Rectal cancer Neg Hx   . Stomach cancer Neg Hx     Allergies  Allergen Reactions  . Ace Inhibitors     Tongue swelling. Reaction to benazepril only.     Current Outpatient Prescriptions on File Prior to Visit  Medication Sig Dispense Refill  . aspirin EC 325 MG tablet Take 1 tablet (325 mg total) by mouth daily. 30 tablet 0  . atorvastatin (LIPITOR) 20 MG tablet TAKE ONE TABLET BY MOUTH ONCE DAILY 90 tablet 1  . dorzolamide-timolol (COSOPT) 22.3-6.8 MG/ML ophthalmic solution Place 1 drop into both eyes every morning.     Marland Kitchen glipiZIDE (GLIPIZIDE XL) 2.5 MG 24 hr tablet Take 1 tablet (2.5 mg total) by mouth daily with breakfast. 90 tablet 1  . glucose blood (ACCU-CHEK AVIVA) test strip 1  each by Other route 2 (two) times daily. 100 each 3  . latanoprost (XALATAN) 0.005 % ophthalmic solution Place 1 drop into both eyes at bedtime.     Marland Kitchen losartan (COZAAR) 50 MG tablet Take 1 tablet (50 mg total) by mouth daily. 90 tablet 3  . metFORMIN (GLUCOPHAGE-XR) 500 MG 24 hr tablet TAKE TWO TABLETS BY MOUTH DAILY WITH  BREAKFAST. 180 tablet 1  . pioglitazone (ACTOS) 45 MG tablet TAKE ONE TABLET BY MOUTH ONCE DAILY 90 tablet 1   No current facility-administered medications on file prior to visit.     BP (!) 144/72 (BP Location: Left Arm, Patient Position: Sitting, Cuff Size: Normal)   Pulse 72   Temp 97.5 F (36.4 C) (Oral)   Ht 5\' 8"  (1.727 m)   Wt 232 lb 9.6 oz (105.5 kg)   SpO2 97%   BMI 35.37 kg/m     Review of Systems  Constitutional: Negative for appetite change, chills, fatigue and fever.  HENT: Negative for congestion, dental problem, ear pain, hearing loss, sore throat, tinnitus,  trouble swallowing and voice change.   Eyes: Negative for pain, discharge and visual disturbance.  Respiratory: Negative for cough, chest tightness, wheezing and stridor.   Cardiovascular: Negative for chest pain, palpitations and leg swelling.  Gastrointestinal: Negative for abdominal distention, abdominal pain, blood in stool, constipation, diarrhea, nausea and vomiting.  Genitourinary: Negative for difficulty urinating, discharge, flank pain, genital sores, hematuria and urgency.  Musculoskeletal: Negative for arthralgias, back pain, gait problem, joint swelling, myalgias and neck stiffness.  Skin: Negative for rash.  Neurological: Negative for dizziness, syncope, speech difficulty, weakness, numbness and headaches.  Hematological: Negative for adenopathy. Does not bruise/bleed easily.  Psychiatric/Behavioral: Negative for behavioral problems and dysphoric mood. The patient is not nervous/anxious.        Objective:   Physical Exam  Constitutional: He is oriented to person, place, and time. He appears well-developed.  Weight 232 Blood pressure 134/74  HENT:  Head: Normocephalic.  Right Ear: External ear normal.  Left Ear: External ear normal.  Eyes: Conjunctivae and EOM are normal.  Neck: Normal range of motion.  Cardiovascular: Normal rate and normal heart sounds.   Pedal pulses full  Pulmonary/Chest: Breath sounds normal.  Abdominal: Bowel sounds are normal.  Musculoskeletal: Normal range of motion. He exhibits no edema or tenderness.  Neurological: He is alert and oriented to person, place, and time.  Psychiatric: He has a normal mood and affect. His behavior is normal.  Vitals reviewed.         Assessment & Plan:   Diabetes mellitus.  Will review a hemoglobin A1c.  Weight loss more rigorous exercise encouraged Essential hypertension Chronic kidney disease Dyslipidemia  Check hemoglobin A1c Nonpharmacologic measures discussed CPX 6 months  \Siegfried Vieth  Pilar Plate

## 2016-07-11 NOTE — Patient Instructions (Addendum)
WE NOW OFFER   [facility] FAST TRACK!!!  SAME DAY Appointments for ACUTE CARE  Such as: Sprains, Injuries, cuts, abrasions, rashes, muscle pain, joint pain, back pain Colds, flu, sore throats, headache, allergies, cough, fever  Ear pain, sinus and eye infections Abdominal pain, nausea, vomiting, diarrhea, upset stomach Animal/insect bites  3 Easy Ways to Schedule: Walk-In Scheduling Call in scheduling Mychart Sign-up: [email or web address]   Limit your sodium (Salt) intake   Please check your hemoglobin A1c every 3-6 months  You need to lose weight.  Consider a lower calorie diet and regular exercise.    It is important that you exercise regularly, at least 20 minutes 3 to 4 times per week.  If you develop chest pain or shortness of breath seek  medical attention.

## 2016-09-17 ENCOUNTER — Other Ambulatory Visit: Payer: Self-pay | Admitting: Internal Medicine

## 2016-10-26 DIAGNOSIS — C61 Malignant neoplasm of prostate: Secondary | ICD-10-CM | POA: Diagnosis not present

## 2016-11-02 DIAGNOSIS — C61 Malignant neoplasm of prostate: Secondary | ICD-10-CM | POA: Diagnosis not present

## 2016-11-14 ENCOUNTER — Other Ambulatory Visit: Payer: Self-pay | Admitting: Internal Medicine

## 2016-11-21 ENCOUNTER — Other Ambulatory Visit: Payer: Self-pay | Admitting: Internal Medicine

## 2016-12-12 ENCOUNTER — Other Ambulatory Visit: Payer: Self-pay | Admitting: Internal Medicine

## 2017-01-09 ENCOUNTER — Ambulatory Visit (INDEPENDENT_AMBULATORY_CARE_PROVIDER_SITE_OTHER): Payer: Medicare Other | Admitting: Internal Medicine

## 2017-01-09 ENCOUNTER — Encounter: Payer: Self-pay | Admitting: Internal Medicine

## 2017-01-09 VITALS — BP 138/78 | HR 76 | Temp 97.8°F | Ht 68.0 in | Wt 234.6 lb

## 2017-01-09 DIAGNOSIS — I10 Essential (primary) hypertension: Secondary | ICD-10-CM | POA: Diagnosis not present

## 2017-01-09 DIAGNOSIS — E1121 Type 2 diabetes mellitus with diabetic nephropathy: Secondary | ICD-10-CM | POA: Diagnosis not present

## 2017-01-09 DIAGNOSIS — E785 Hyperlipidemia, unspecified: Secondary | ICD-10-CM

## 2017-01-09 DIAGNOSIS — C61 Malignant neoplasm of prostate: Secondary | ICD-10-CM

## 2017-01-09 LAB — COMPREHENSIVE METABOLIC PANEL
ALT: 13 U/L (ref 0–53)
AST: 14 U/L (ref 0–37)
Albumin: 4 g/dL (ref 3.5–5.2)
Alkaline Phosphatase: 68 U/L (ref 39–117)
BUN: 19 mg/dL (ref 6–23)
CO2: 29 meq/L (ref 19–32)
Calcium: 9.3 mg/dL (ref 8.4–10.5)
Chloride: 105 mEq/L (ref 96–112)
Creatinine, Ser: 1.57 mg/dL — ABNORMAL HIGH (ref 0.40–1.50)
GFR: 45.81 mL/min — AB (ref >60.00)
GLUCOSE: 165 mg/dL — AB (ref 70–99)
POTASSIUM: 4.6 meq/L (ref 3.5–5.1)
Sodium: 140 mEq/L (ref 135–145)
Total Bilirubin: 0.9 mg/dL (ref 0.2–1.2)
Total Protein: 6.5 g/dL (ref 6.0–8.3)

## 2017-01-09 LAB — CBC WITH DIFFERENTIAL/PLATELET
Basophils Absolute: 0 10*3/uL (ref 0.0–0.1)
Basophils Relative: 0.6 % (ref 0.0–3.0)
EOS PCT: 2.7 % (ref 0.0–5.0)
Eosinophils Absolute: 0.1 10*3/uL (ref 0.0–0.7)
HCT: 37 % — ABNORMAL LOW (ref 39.0–52.0)
Hemoglobin: 12.4 g/dL — ABNORMAL LOW (ref 13.0–17.0)
LYMPHS ABS: 0.7 10*3/uL (ref 0.7–4.0)
Lymphocytes Relative: 15.1 % (ref 12.0–46.0)
MCHC: 33.4 g/dL (ref 30.0–36.0)
MCV: 101.2 fl — AB (ref 78.0–100.0)
MONOS PCT: 9 % (ref 3.0–12.0)
Monocytes Absolute: 0.4 10*3/uL (ref 0.1–1.0)
NEUTROS ABS: 3.2 10*3/uL (ref 1.4–7.7)
NEUTROS PCT: 72.6 % (ref 43.0–77.0)
PLATELETS: 215 10*3/uL (ref 150.0–400.0)
RBC: 3.65 Mil/uL — ABNORMAL LOW (ref 4.22–5.81)
RDW: 13.8 % (ref 11.5–15.5)
WBC: 4.4 10*3/uL (ref 4.0–10.5)

## 2017-01-09 LAB — TSH: TSH: 3.51 u[IU]/mL (ref 0.35–4.50)

## 2017-01-09 LAB — LIPID PANEL
CHOL/HDL RATIO: 4
Cholesterol: 105 mg/dL (ref 0–200)
HDL: 26.4 mg/dL — AB (ref >39.00)
LDL Cholesterol: 42 mg/dL (ref 0–99)
NONHDL: 78.74
Triglycerides: 186 mg/dL — ABNORMAL HIGH (ref 0.0–149.0)
VLDL: 37.2 mg/dL (ref 0.0–40.0)

## 2017-01-09 LAB — POCT GLYCOSYLATED HEMOGLOBIN (HGB A1C): HEMOGLOBIN A1C: 6.5

## 2017-01-09 LAB — MICROALBUMIN / CREATININE URINE RATIO
Creatinine,U: 95.2 mg/dL
Microalb Creat Ratio: 3.2 mg/g (ref 0.0–30.0)
Microalb, Ur: 3 mg/dL — ABNORMAL HIGH (ref 0.0–1.9)

## 2017-01-09 NOTE — Patient Instructions (Addendum)
Limit your sodium (Salt) intake    It is important that you exercise regularly, at least 20 minutes 3 to 4 times per week.  If you develop chest pain or shortness of breath seek  medical attention.   Please check your hemoglobin A1c every 3-6  Months  Please see your eye doctor yearly to check for diabetic eye damage  Return in 6 months for follow-up    Back Exercises If you have pain in your back, do these exercises 2-3 times each day or as told by your doctor. When the pain goes away, do the exercises once each day, but repeat the steps more times for each exercise (do more repetitions). If you do not have pain in your back, do these exercises once each day or as told by your doctor. Exercises Single Knee to Chest  Do these steps 3-5 times in a row for each leg: 1. Lie on your back on a firm bed or the floor with your legs stretched out. 2. Bring one knee to your chest. 3. Hold your knee to your chest by grabbing your knee or thigh. 4. Pull on your knee until you feel a gentle stretch in your lower back. 5. Keep doing the stretch for 10-30 seconds. 6. Slowly let go of your leg and straighten it.  Pelvic Tilt  Do these steps 5-10 times in a row: 1. Lie on your back on a firm bed or the floor with your legs stretched out. 2. Bend your knees so they point up to the ceiling. Your feet should be flat on the floor. 3. Tighten your lower belly (abdomen) muscles to press your lower back against the floor. This will make your tailbone point up to the ceiling instead of pointing down to your feet or the floor. 4. Stay in this position for 5-10 seconds while you gently tighten your muscles and breathe evenly.  Cat-Cow  Do these steps until your lower back bends more easily: 1. Get on your hands and knees on a firm surface. Keep your hands under your shoulders, and keep your knees under your hips. You may put padding under your knees. 2. Let your head hang down, and make your tailbone  point down to the floor so your lower back is round like the back of a cat. 3. Stay in this position for 5 seconds. 4. Slowly lift your head and make your tailbone point up to the ceiling so your back hangs low (sags) like the back of a cow. 5. Stay in this position for 5 seconds.  Press-Ups  Do these steps 5-10 times in a row: 1. Lie on your belly (face-down) on the floor. 2. Place your hands near your head, about shoulder-width apart. 3. While you keep your back relaxed and keep your hips on the floor, slowly straighten your arms to raise the top half of your body and lift your shoulders. Do not use your back muscles. To make yourself more comfortable, you may change where you place your hands. 4. Stay in this position for 5 seconds. 5. Slowly return to lying flat on the floor.  Bridges  Do these steps 10 times in a row: 1. Lie on your back on a firm surface. 2. Bend your knees so they point up to the ceiling. Your feet should be flat on the floor. 3. Tighten your butt muscles and lift your butt off of the floor until your waist is almost as high as your knees. If you  do not feel the muscles working in your butt and the back of your thighs, slide your feet 1-2 inches farther away from your butt. 4. Stay in this position for 3-5 seconds. 5. Slowly lower your butt to the floor, and let your butt muscles relax.  If this exercise is too easy, try doing it with your arms crossed over your chest. Belly Crunches  Do these steps 5-10 times in a row: 1. Lie on your back on a firm bed or the floor with your legs stretched out. 2. Bend your knees so they point up to the ceiling. Your feet should be flat on the floor. 3. Cross your arms over your chest. 4. Tip your chin a little bit toward your chest but do not bend your neck. 5. Tighten your belly muscles and slowly raise your chest just enough to lift your shoulder blades a tiny bit off of the floor. 6. Slowly lower your chest and your head  to the floor.  Back Lifts Do these steps 5-10 times in a row: 1. Lie on your belly (face-down) with your arms at your sides, and rest your forehead on the floor. 2. Tighten the muscles in your legs and your butt. 3. Slowly lift your chest off of the floor while you keep your hips on the floor. Keep the back of your head in line with the curve in your back. Look at the floor while you do this. 4. Stay in this position for 3-5 seconds. 5. Slowly lower your chest and your face to the floor.  Contact a doctor if:  Your back pain gets a lot worse when you do an exercise.  Your back pain does not lessen 2 hours after you exercise. If you have any of these problems, stop doing the exercises. Do not do them again unless your doctor says it is okay. Get help right away if:  You have sudden, very bad back pain. If this happens, stop doing the exercises. Do not do them again unless your doctor says it is okay. This information is not intended to replace advice given to you by your health care provider. Make sure you discuss any questions you have with your health care provider. Document Released: 02/12/2010 Document Revised: 06/18/2015 Document Reviewed: 03/06/2014 Elsevier Interactive Patient Education  [employer].

## 2017-01-09 NOTE — Progress Notes (Signed)
Subjective:    Patient ID: Rodney [name], male    DOB: [birth year], 76 y.o.   MRN: [id number]  HPI  Lab Results  Component Value Date   HGBA1C 6.4 07/11/2016    BP Readings from Last 3 Encounters:  01/09/17 138/78  07/11/16 (!) 144/72  01/06/16 (!) 57/27   76 year old patient who is seen today for his biannual follow-up.  He has a history of type 2 diabetes and chronic kidney disease. He has had his annual eye examination.  No concerns or complaints.  He does have dyslipidemia and essential hypertension.  He does have a prior history of colonic polyps as well as prostate cancer. No cardiopulmonary complaints. His only complaint today is some right lumbar discomfort present for several days  Past Medical History:  Diagnosis Date  . COLONIC POLYPS, HX OF 12/02/2008  . DIABETES MELLITUS, TYPE II 07/19/2007  . Erectile dysfunction   . GERD (gastroesophageal reflux disease)   . Glaucoma   . GOUT 07/19/2007  . HYPERLIPIDEMIA 12/02/2008  . HYPERTENSION 07/19/2007  . Kidney stones   . Prostate cancer ([place])   . Reiter syndrome    incomplete     Social History   Socioeconomic History  . Marital status: Married    Spouse name: Not on file  . Number of children: Not on file  . Years of education: Not on file  . Highest education level: Not on file  Social Needs  . Financial resource strain: Not on file  . Food insecurity - worry: Not on file  . Food insecurity - inability: Not on file  . Transportation needs - medical: Not on file  . Transportation needs - non-medical: Not on file  Occupational History  . Not on file  Tobacco Use  . Smoking status: Former Smoker    Types: Cigarettes    Last attempt to quit: 01/24/1978    Years since quitting: 38.9  . Smokeless tobacco: Never Used  Substance and Sexual Activity  . Alcohol use: Yes    Comment: occasionally -socially  . Drug use: No  . Sexual activity: Not on file    Comment: 2 childern  Other Topics Concern  . Not  on file  Social History Narrative  . Not on file    Past Surgical History:  Procedure Laterality Date  . bladder neck dilation    . CATARACT EXTRACTION     both eyes- 2015  . COLONOSCOPY    . ELBOW SURGERY     bone spur - right  . ORIF ANKLE FRACTURE Left 05/17/2015   Procedure: OPEN REDUCTION INTERNAL FIXATION (ORIF) ANKLE FRACTURE;  Surgeon: Meredith Pel, MD;  Location: [facility];  Service: Orthopedics;  Laterality: Left;  . PROSTATECTOMY    . VASECTOMY      Family History  Problem Relation Age of Onset  . Cancer Sister        metastatic colon ca  . Colon cancer Neg Hx   . Esophageal cancer Neg Hx   . Rectal cancer Neg Hx   . Stomach cancer Neg Hx     Allergies  Allergen Reactions  . Ace Inhibitors     Tongue swelling. Reaction to benazepril only.     Current Outpatient Medications on File Prior to Visit  Medication Sig Dispense Refill  . aspirin EC 325 MG tablet Take 1 tablet (325 mg total) by mouth daily. 30 tablet 0  . atorvastatin (LIPITOR) 20 MG tablet TAKE 1 TABLET BY MOUTH  ONCE DAILY 90 tablet 1  . dorzolamide-timolol (COSOPT) 22.3-6.8 MG/ML ophthalmic solution Place 1 drop into both eyes every morning.     Marland Kitchen glipiZIDE (GLIPIZIDE XL) 2.5 MG 24 hr tablet Take 1 tablet (2.5 mg total) by mouth daily with breakfast. 90 tablet 1  . glucose blood (ACCU-CHEK AVIVA) test strip 1 each by Other route 2 (two) times daily. 100 each 3  . latanoprost (XALATAN) 0.005 % ophthalmic solution Place 1 drop into both eyes at bedtime.     Marland Kitchen losartan (COZAAR) 50 MG tablet TAKE ONE TABLET BY MOUTH ONCE DAILY 90 tablet 3  . metFORMIN (GLUCOPHAGE-XR) 500 MG 24 hr tablet TAKE 2 TABLETS BY MOUTH ONCE DAILY WITH  BREAKFAST 180 tablet 1  . pioglitazone (ACTOS) 45 MG tablet TAKE 1 TABLET BY MOUTH ONCE DAILY 90 tablet 1   No current facility-administered medications on file prior to visit.     BP 138/78 (BP Location: Left Arm, Patient Position: Sitting, Cuff Size: Normal)   Pulse 76    Temp 97.8 F (36.6 C) (Oral)   Ht 5\' 8"  (1.727 m)   Wt 234 lb 9.6 oz (106.4 kg)   SpO2 98%   BMI 35.67 kg/m      Review of Systems  Constitutional: Negative for appetite change, chills, fatigue and fever.  HENT: Negative for congestion, dental problem, ear pain, hearing loss, sore throat, tinnitus, trouble swallowing and voice change.   Eyes: Negative for pain, discharge and visual disturbance.  Respiratory: Negative for cough, chest tightness, wheezing and stridor.   Cardiovascular: Negative for chest pain, palpitations and leg swelling.  Gastrointestinal: Negative for abdominal distention, abdominal pain, blood in stool, constipation, diarrhea, nausea and vomiting.  Genitourinary: Negative for difficulty urinating, discharge, flank pain, genital sores, hematuria and urgency.  Musculoskeletal: Positive for back pain. Negative for arthralgias, gait problem, joint swelling, myalgias and neck stiffness.  Skin: Negative for rash.  Neurological: Negative for dizziness, syncope, speech difficulty, weakness, numbness and headaches.  Hematological: Negative for adenopathy. Does not bruise/bleed easily.  Psychiatric/Behavioral: Negative for behavioral problems and dysphoric mood. The patient is not nervous/anxious.        Objective:   Physical Exam  Constitutional: He is oriented to person, place, and time. He appears well-developed.  HENT:  Head: Normocephalic.  Right Ear: External ear normal.  Left Ear: External ear normal.  Eyes: Conjunctivae and EOM are normal.  Neck: Normal range of motion.  Cardiovascular: Normal rate and normal heart sounds.  Pulmonary/Chest: Breath sounds normal.  Abdominal: Bowel sounds are normal.  Musculoskeletal: Normal range of motion. He exhibits no edema or tenderness.  Neurological: He is alert and oriented to person, place, and time.  Psychiatric: He has a normal mood and affect. His behavior is normal.          Assessment & Plan:   Diabetes  mellitus.  Will check hemoglobin A1c urine for microalbumin Dyslipidemia check lipid profile History of prostate cancer.  Check PSA Essential hypertension fair control.  Continue present regimen Low back pain.  Suspect musculoligamentous.  Will try some stretching range of motion type exercises  Follow-up 6 months  Eliya Geiman Pilar Plate

## 2017-03-13 ENCOUNTER — Other Ambulatory Visit: Payer: Self-pay | Admitting: Internal Medicine

## 2017-03-15 DIAGNOSIS — E119 Type 2 diabetes mellitus without complications: Secondary | ICD-10-CM | POA: Diagnosis not present

## 2017-03-15 DIAGNOSIS — H5203 Hypermetropia, bilateral: Secondary | ICD-10-CM | POA: Diagnosis not present

## 2017-03-15 LAB — HM DIABETES EYE EXAM

## 2017-03-24 DIAGNOSIS — H26491 Other secondary cataract, right eye: Secondary | ICD-10-CM | POA: Diagnosis not present

## 2017-03-24 DIAGNOSIS — H26492 Other secondary cataract, left eye: Secondary | ICD-10-CM | POA: Diagnosis not present

## 2017-03-31 DIAGNOSIS — H26493 Other secondary cataract, bilateral: Secondary | ICD-10-CM | POA: Diagnosis not present

## 2017-04-10 ENCOUNTER — Telehealth: Payer: Self-pay | Admitting: Internal Medicine

## 2017-04-10 NOTE — Telephone Encounter (Signed)
Generic Benicar 20 mg #90  One  daily.  Let me know if Benicar does not have a generic

## 2017-04-10 NOTE — Telephone Encounter (Signed)
Copied from [place] [phone]. Topic: Quick Communication - Rx Refill/Question >> Apr 10, 2017  8:13 AM Margot Ables wrote: Medication: pt received letter from [facility] regarding his losartan being recalled - please notify pt of how to proceed/med change  Preferred Pharmacy (with phone number or street name): [place], [place] - [address] [phone] (Phone) [phone] (Fax)

## 2017-04-11 NOTE — Telephone Encounter (Signed)
Medication as called in to [facility].

## 2017-04-11 NOTE — Telephone Encounter (Signed)
Called pt to make him aware that a new Rx is called in.

## 2017-04-11 NOTE — Telephone Encounter (Signed)
Pt. Is calling regarding medication that was recalled and asking about a different medication. He is needing this today.

## 2017-05-10 DIAGNOSIS — C61 Malignant neoplasm of prostate: Secondary | ICD-10-CM | POA: Diagnosis not present

## 2017-05-17 ENCOUNTER — Other Ambulatory Visit (HOSPITAL_COMMUNITY): Payer: Self-pay | Admitting: Urology

## 2017-05-17 DIAGNOSIS — C61 Malignant neoplasm of prostate: Secondary | ICD-10-CM | POA: Diagnosis not present

## 2017-05-22 ENCOUNTER — Other Ambulatory Visit: Payer: Self-pay | Admitting: Internal Medicine

## 2017-05-24 ENCOUNTER — Other Ambulatory Visit: Payer: Self-pay | Admitting: Internal Medicine

## 2017-05-29 ENCOUNTER — Encounter (HOSPITAL_COMMUNITY)
Admission: RE | Admit: 2017-05-29 | Discharge: 2017-05-29 | Disposition: A | Discharge status: Home / Self Care | Payer: Medicare Other | Source: Ambulatory Visit | Attending: Urology | Admitting: Urology

## 2017-05-29 DIAGNOSIS — C61 Malignant neoplasm of prostate: Secondary | ICD-10-CM | POA: Insufficient documentation

## 2017-05-29 MED ORDER — TECHNETIUM TC 99M MEDRONATE IV KIT
21.6000 | PACK | Freq: Once | INTRAVENOUS | Status: AC | PRN
  Administered 2017-05-29: 21.6 via INTRAVENOUS

## 2017-06-13 ENCOUNTER — Other Ambulatory Visit: Payer: Self-pay | Admitting: Internal Medicine

## 2017-06-15 ENCOUNTER — Other Ambulatory Visit: Payer: Self-pay | Admitting: Internal Medicine

## 2017-06-16 ENCOUNTER — Other Ambulatory Visit: Payer: Self-pay | Admitting: Internal Medicine

## 2017-06-16 MED ORDER — METFORMIN HCL ER 500 MG PO TB24: ORAL_TABLET | ORAL | 1 refills | Status: DC

## 2017-06-16 NOTE — Telephone Encounter (Addendum)
Refill for metformin 500 mg 24 hr tab, LR 12/12/16 for #180 and 1 refill. Last HA1C was 6.5 on 01/09/17    Dr. Burnice [name] LOV  01/09/17 NOV  07/10/17  [id number]

## 2017-07-10 ENCOUNTER — Ambulatory Visit (INDEPENDENT_AMBULATORY_CARE_PROVIDER_SITE_OTHER): Payer: Medicare Other | Admitting: Internal Medicine

## 2017-07-10 ENCOUNTER — Encounter: Payer: Self-pay | Admitting: Internal Medicine

## 2017-07-10 VITALS — BP 130/62 | HR 77 | Temp 97.9°F | Wt 231.0 lb

## 2017-07-10 DIAGNOSIS — N189 Chronic kidney disease, unspecified: Secondary | ICD-10-CM

## 2017-07-10 DIAGNOSIS — I10 Essential (primary) hypertension: Secondary | ICD-10-CM

## 2017-07-10 DIAGNOSIS — E785 Hyperlipidemia, unspecified: Secondary | ICD-10-CM | POA: Diagnosis not present

## 2017-07-10 DIAGNOSIS — E1122 Type 2 diabetes mellitus with diabetic chronic kidney disease: Secondary | ICD-10-CM | POA: Diagnosis not present

## 2017-07-10 DIAGNOSIS — C61 Malignant neoplasm of prostate: Secondary | ICD-10-CM

## 2017-07-10 DIAGNOSIS — E0821 Diabetes mellitus due to underlying condition with diabetic nephropathy: Secondary | ICD-10-CM

## 2017-07-10 LAB — POCT GLYCOSYLATED HEMOGLOBIN (HGB A1C): HEMOGLOBIN A1C: 6.4 % — AB (ref 4.0–5.6)

## 2017-07-10 NOTE — Patient Instructions (Signed)
Limit your sodium (Salt) intake   Please check your hemoglobin A1c every 3-6 months Return in 6 months for your annual exam   Please see your eye doctor yearly to check for diabetic eye damage    It is important that you exercise regularly, at least 20 minutes 3 to 4 times per week.  If you develop chest pain or shortness of breath seek  medical attention.  You need to lose weight.  Consider a lower calorie diet and regular exercise.

## 2017-07-10 NOTE — Progress Notes (Signed)
Subjective:    Patient ID: Rodney [name], male    DOB: [birth year], 77 y.o.   MRN: [id number]  HPI Lab Results  Component Value Date   HGBA1C 6.4 (A) 07/10/2017    77 year old patient who is seen today for follow-up of type 2 diabetes.  Doing quite well.  No concerns or complaints He has history of essential hypertension and chronic kidney disease. Denies any cardiopulmonary complaints. Ophthalmology exam about 4 months ago   last GFR was 45.  GFR has been lower in the past and he is on submaximal dose of metformin therapy    Past Medical History:  Diagnosis Date  . COLONIC POLYPS, HX OF 12/02/2008  . DIABETES MELLITUS, TYPE II 07/19/2007  . Erectile dysfunction   . GERD (gastroesophageal reflux disease)   . Glaucoma   . GOUT 07/19/2007  . HYPERLIPIDEMIA 12/02/2008  . HYPERTENSION 07/19/2007  . Kidney stones   . Prostate cancer ([place])   . Reiter syndrome    incomplete     Social History   Socioeconomic History  . Marital status: Married    Spouse name: Not on file  . Number of children: Not on file  . Years of education: Not on file  . Highest education level: Not on file  Occupational History  . Not on file  Social Needs  . Financial resource strain: Not on file  . Food insecurity:    Worry: Not on file    Inability: Not on file  . Transportation needs:    Medical: Not on file    Non-medical: Not on file  Tobacco Use  . Smoking status: Former Smoker    Types: Cigarettes    Last attempt to quit: 01/24/1978    Years since quitting: 39.4  . Smokeless tobacco: Never Used  Substance and Sexual Activity  . Alcohol use: Yes    Comment: occasionally -socially  . Drug use: No  . Sexual activity: Not on file    Comment: 2 childern  Lifestyle  . Physical activity:    Days per week: Not on file    Minutes per session: Not on file  . Stress: Not on file  Relationships  . Social connections:    Talks on phone: Not on file    Gets together: Not on file   Attends religious service: Not on file    Active member of club or organization: Not on file    Attends meetings of clubs or organizations: Not on file    Relationship status: Not on file  . Intimate partner violence:    Fear of current or ex partner: Not on file    Emotionally abused: Not on file    Physically abused: Not on file    Forced sexual activity: Not on file  Other Topics Concern  . Not on file  Social History Narrative  . Not on file    Past Surgical History:  Procedure Laterality Date  . bladder neck dilation    . CATARACT EXTRACTION     both eyes- 2015  . COLONOSCOPY    . ELBOW SURGERY     bone spur - right  . ORIF ANKLE FRACTURE Left 05/17/2015   Procedure: OPEN REDUCTION INTERNAL FIXATION (ORIF) ANKLE FRACTURE;  Surgeon: Meredith Pel, MD;  Location: [facility];  Service: Orthopedics;  Laterality: Left;  . PROSTATECTOMY    . VASECTOMY      Family History  Problem Relation Age of Onset  . Cancer Sister  metastatic colon ca  . Colon cancer Neg Hx   . Esophageal cancer Neg Hx   . Rectal cancer Neg Hx   . Stomach cancer Neg Hx     Allergies  Allergen Reactions  . Ace Inhibitors     Tongue swelling. Reaction to benazepril only.     Current Outpatient Medications on File Prior to Visit  Medication Sig Dispense Refill  . aspirin EC 325 MG tablet Take 1 tablet (325 mg total) by mouth daily. 30 tablet 0  . atorvastatin (LIPITOR) 20 MG tablet TAKE 1 TABLET BY MOUTH ONCE DAILY 90 tablet 1  . dorzolamide-timolol (COSOPT) 22.3-6.8 MG/ML ophthalmic solution Place 1 drop into both eyes every morning.     Marland Kitchen glipiZIDE (GLIPIZIDE XL) 2.5 MG 24 hr tablet Take 1 tablet (2.5 mg total) by mouth daily with breakfast. 90 tablet 1  . glucose blood (ACCU-CHEK AVIVA) test strip 1 each by Other route 2 (two) times daily. 100 each 3  . latanoprost (XALATAN) 0.005 % ophthalmic solution Place 1 drop into both eyes at bedtime.     Marland Kitchen losartan (COZAAR) 50 MG tablet TAKE ONE  TABLET BY MOUTH ONCE DAILY 90 tablet 3  . metFORMIN (GLUCOPHAGE-XR) 500 MG 24 hr tablet TAKE 2 TABLETS BY MOUTH ONCE DAILY WITH  BREAKFAST 180 tablet 1  . pioglitazone (ACTOS) 45 MG tablet TAKE 1 TABLET BY MOUTH ONCE DAILY 90 tablet 1   No current facility-administered medications on file prior to visit.     BP 130/62 (BP Location: Right Arm, Patient Position: Sitting, Cuff Size: Large)   Pulse 77   Temp 97.9 F (36.6 C) (Oral)   Wt 231 lb (104.8 kg)   SpO2 98%   BMI 35.12 kg/m       Review of Systems  Constitutional: Negative for appetite change, chills, fatigue and fever.  HENT: Negative for congestion, dental problem, ear pain, hearing loss, sore throat, tinnitus, trouble swallowing and voice change.   Eyes: Negative for pain, discharge and visual disturbance.  Respiratory: Negative for cough, chest tightness, wheezing and stridor.   Cardiovascular: Negative for chest pain, palpitations and leg swelling.  Gastrointestinal: Negative for abdominal distention, abdominal pain, blood in stool, constipation, diarrhea, nausea and vomiting.  Genitourinary: Negative for difficulty urinating, discharge, flank pain, genital sores, hematuria and urgency.  Musculoskeletal: Negative for arthralgias, back pain, gait problem, joint swelling, myalgias and neck stiffness.  Skin: Negative for rash.  Neurological: Negative for dizziness, syncope, speech difficulty, weakness, numbness and headaches.  Hematological: Negative for adenopathy. Does not bruise/bleed easily.  Psychiatric/Behavioral: Negative for behavioral problems and dysphoric mood. The patient is not nervous/anxious.        Objective:   Physical Exam  Constitutional: He is oriented to person, place, and time. He appears well-developed.  Weight 231  HENT:  Head: Normocephalic.  Right Ear: External ear normal.  Left Ear: External ear normal.  Eyes: Conjunctivae and EOM are normal.  Neck: Normal range of motion.    Cardiovascular: Normal rate and normal heart sounds.  Pulmonary/Chest: Breath sounds normal.  Abdominal: Bowel sounds are normal.  Musculoskeletal: Normal range of motion. He exhibits no edema or tenderness.  Neurological: He is alert and oriented to person, place, and time.  Psychiatric: He has a normal mood and affect. His behavior is normal.          Assessment & Plan:   Diabetes mellitus.  Hemoglobin A1c remains well controlled on triple oral therapy.  No change in therapy  Chronic kidney disease.  GFR presently 45;  we will continue present submaximal metformin dose.  Recheck renal indices in 6 months.   Essential hypertension stable  history of prostate cancer  Annual CPX with new provider in 6 months  Rodney [name]

## 2017-09-08 ENCOUNTER — Encounter: Payer: Self-pay | Admitting: *Deleted

## 2017-09-12 ENCOUNTER — Other Ambulatory Visit: Payer: Self-pay | Admitting: Internal Medicine

## 2017-11-07 ENCOUNTER — Other Ambulatory Visit: Payer: Self-pay | Admitting: Internal Medicine

## 2017-11-24 ENCOUNTER — Other Ambulatory Visit: Payer: Self-pay | Admitting: Internal Medicine

## 2017-11-28 ENCOUNTER — Encounter: Payer: Self-pay | Admitting: Internal Medicine

## 2017-12-11 ENCOUNTER — Other Ambulatory Visit: Payer: Self-pay | Admitting: Internal Medicine

## 2017-12-11 DIAGNOSIS — H401131 Primary open-angle glaucoma, bilateral, mild stage: Secondary | ICD-10-CM | POA: Diagnosis not present

## 2017-12-12 ENCOUNTER — Other Ambulatory Visit: Payer: Self-pay

## 2018-01-03 ENCOUNTER — Encounter: Payer: Self-pay | Admitting: Internal Medicine

## 2018-01-03 ENCOUNTER — Ambulatory Visit (INDEPENDENT_AMBULATORY_CARE_PROVIDER_SITE_OTHER): Payer: Medicare Other | Admitting: Internal Medicine

## 2018-01-03 VITALS — BP 130/70 | HR 93 | Temp 98.1°F | Wt 231.4 lb

## 2018-01-03 DIAGNOSIS — E785 Hyperlipidemia, unspecified: Secondary | ICD-10-CM | POA: Diagnosis not present

## 2018-01-03 DIAGNOSIS — N183 Type 2 diabetes mellitus with diabetic chronic kidney disease: Secondary | ICD-10-CM

## 2018-01-03 DIAGNOSIS — E0821 Diabetes mellitus due to underlying condition with diabetic nephropathy: Secondary | ICD-10-CM

## 2018-01-03 DIAGNOSIS — C61 Malignant neoplasm of prostate: Secondary | ICD-10-CM

## 2018-01-03 DIAGNOSIS — Z8546 Personal history of malignant neoplasm of prostate: Secondary | ICD-10-CM

## 2018-01-03 DIAGNOSIS — I129 Hypertensive chronic kidney disease with stage 1 through stage 4 chronic kidney disease, or unspecified chronic kidney disease: Secondary | ICD-10-CM

## 2018-01-03 DIAGNOSIS — E1122 Type 2 diabetes mellitus with diabetic chronic kidney disease: Secondary | ICD-10-CM | POA: Diagnosis not present

## 2018-01-03 DIAGNOSIS — I10 Essential (primary) hypertension: Secondary | ICD-10-CM

## 2018-01-03 LAB — POCT GLYCOSYLATED HEMOGLOBIN (HGB A1C): HEMOGLOBIN A1C: 5.9 % — AB (ref 4.0–5.6)

## 2018-01-03 NOTE — Progress Notes (Addendum)
Established Patient Office Visit     CC/Reason for Visit: To establish care and follow-up on chronic medical conditions  HPI: Rodney [name] is a 77 y.o. male who is coming in today for the above mentioned reasons.  He is past due for his annual wellness exam.  Past Medical History is significant for: Diabetes that has been well controlled, hyperlipidemia controlled on a statin, hypertension that has been well controlled, stage III chronic kidney disease as well as a history of prostate cancer.  He has no acute complaints on today's visit.   Past Medical/Surgical History: Past Medical History:  Diagnosis Date  . COLONIC POLYPS, HX OF 12/02/2008  . DIABETES MELLITUS, TYPE II 07/19/2007  . Erectile dysfunction   . GERD (gastroesophageal reflux disease)   . Glaucoma   . GOUT 07/19/2007  . HYPERLIPIDEMIA 12/02/2008  . HYPERTENSION 07/19/2007  . Kidney stones   . Prostate cancer ([place])   . Reiter syndrome    incomplete    Past Surgical History:  Procedure Laterality Date  . bladder neck dilation    . CATARACT EXTRACTION     both eyes- 2015  . COLONOSCOPY    . ELBOW SURGERY     bone spur - right  . ORIF ANKLE FRACTURE Left 05/17/2015   Procedure: OPEN REDUCTION INTERNAL FIXATION (ORIF) ANKLE FRACTURE;  Surgeon: Rodney Pel, MD;  Location: [facility];  Service: Orthopedics;  Laterality: Left;  . PROSTATECTOMY    . VASECTOMY      Social History:  reports that he quit smoking about 39 years ago. His smoking use included cigarettes. He has never used smokeless tobacco. He reports that he drinks alcohol. He reports that he does not use drugs.  Allergies: Allergies  Allergen Reactions  . Ace Inhibitors     Tongue swelling. Reaction to benazepril only.     Family History:  Family History  Problem Relation Age of Onset  . Cancer Sister        metastatic colon ca  . Colon cancer Neg Hx   . Esophageal cancer Neg Hx   . Rectal cancer Neg Hx   . Stomach cancer Neg Hx       Current Outpatient Medications:  .  aspirin EC 325 MG tablet, Take 1 tablet (325 mg total) by mouth daily., Disp: 30 tablet, Rfl: 0 .  atorvastatin (LIPITOR) 20 MG tablet, TAKE 1 TABLET BY MOUTH ONCE DAILY, Disp: 90 tablet, Rfl: 1 .  dorzolamide-timolol (COSOPT) 22.3-6.8 MG/ML ophthalmic solution, Place 1 drop into both eyes every morning. , Disp: , Rfl:  .  glipiZIDE (GLIPIZIDE XL) 2.5 MG 24 hr tablet, Take 1 tablet (2.5 mg total) by mouth daily with breakfast., Disp: 90 tablet, Rfl: 1 .  glucose blood (ACCU-CHEK AVIVA) test strip, 1 each by Other route 2 (two) times daily., Disp: 100 each, Rfl: 3 .  latanoprost (XALATAN) 0.005 % ophthalmic solution, Place 1 drop into both eyes at bedtime. , Disp: , Rfl:  .  losartan (COZAAR) 50 MG tablet, TAKE 1 TABLET BY MOUTH ONCE DAILY, Disp: 90 tablet, Rfl: 3 .  metFORMIN (GLUCOPHAGE-XR) 500 MG 24 hr tablet, TAKE 2 TABLETS BY MOUTH ONCE DAILY WITH BREAKFAST, Disp: 180 tablet, Rfl: 1 .  pioglitazone (ACTOS) 45 MG tablet, TAKE 1 TABLET BY MOUTH ONCE DAILY, Disp: 90 tablet, Rfl: 1  Review of Systems:  Constitutional: Denies fever, chills, diaphoresis, appetite change and fatigue.  HEENT: Denies photophobia, eye pain, redness, hearing loss, ear  pain, congestion, sore throat, rhinorrhea, sneezing, mouth sores, trouble swallowing, neck pain, neck stiffness and tinnitus.   Respiratory: Denies SOB, DOE, cough, chest tightness,  and wheezing.   Cardiovascular: Denies chest pain, palpitations and leg swelling.  Gastrointestinal: Denies nausea, vomiting, abdominal pain, diarrhea, constipation, blood in stool and abdominal distention.  Genitourinary: Denies dysuria, urgency, frequency, hematuria, flank pain and difficulty urinating.  Endocrine: Denies: hot or cold intolerance, sweats, changes in hair or nails, polyuria, polydipsia. Musculoskeletal: Denies myalgias, back pain, joint swelling, arthralgias and gait problem.  Skin: Denies pallor, rash and wound.    Neurological: Denies dizziness, seizures, syncope, weakness, light-headedness, numbness and headaches.  Hematological: Denies adenopathy. Easy bruising, personal or family bleeding history  Psychiatric/Behavioral: Denies suicidal ideation, mood changes, confusion, nervousness, sleep disturbance and agitation    Physical Exam: Vitals:   01/03/18 1339  BP: 130/70  Pulse: 93  Temp: 98.1 F (36.7 C)  TempSrc: Oral  SpO2: 97%  Weight: 231 lb 6.4 oz (105 kg)    Body mass index is 35.18 kg/m.   Constitutional: NAD, calm, comfortable Eyes: PERRL, lids and conjunctivae normal ENMT: Mucous membranes are moist.  Neck: normal, supple, no masses, no thyromegaly Respiratory: clear to auscultation bilaterally, no wheezing, no crackles. Normal respiratory effort. No accessory muscle use.  Cardiovascular: Regular rate and rhythm, no murmurs / rubs / gallops. No extremity edema. 2+ pedal pulses. No carotid bruits.  Abdomen: no tenderness, no masses palpated. No hepatosplenomegaly. Bowel sounds positive.  Musculoskeletal: no clubbing / cyanosis. No joint deformity upper and lower extremities. Good ROM, no contractures. Normal muscle tone.  Skin: no rashes, lesions, ulcers. No induration Neurologic: Grossly intact and nonfocal Psychiatric: Normal judgment and insight. Alert and oriented x 3. Normal mood.    Impression and Plan:  Diabetes mellitus with stage 3 CKD ([place]) -Extremely well controlled.  A1c today has further dropped to 5.9. -Educated on signs and symptoms of hypoglycemia, he has not had any.  Dyslipidemia -On atorvastatin, last LDL was 42 in December 2018.  Essential hypertension -Well-controlled on losartan.  Chronic kidney disease (CKD), stage III (moderate) ([facility]) -The last creatinine we have documented was 1.57 in December 2018 with a GFR 45. -Needs to have renal function checked, if GFR remains in this range or lower we will need to consider cessation of both metformin,  due to risk of lactic acidosis, as well as losartan.  Prostate cancer ([place]) -History of, no longer follows closely with urology.    Patient Instructions  -It was nice meeting you today!  -Please come in fasting tomorrow for blood work.  We will let you know the results.  -Please schedule follow-up with me in 3 to 4 months.     Lelon Frohlich, MD [facility]

## 2018-01-03 NOTE — Patient Instructions (Signed)
-  It was nice meeting you today!  -Please come in fasting tomorrow for blood work.  We will let you know the results.  -Please schedule follow-up with me in 3 to 4 months.

## 2018-01-04 ENCOUNTER — Other Ambulatory Visit: Payer: Self-pay | Admitting: Internal Medicine

## 2018-01-04 ENCOUNTER — Encounter: Payer: Self-pay | Admitting: Internal Medicine

## 2018-01-04 DIAGNOSIS — E559 Vitamin D deficiency, unspecified: Secondary | ICD-10-CM

## 2018-01-04 LAB — CBC WITH DIFFERENTIAL/PLATELET
BASOS ABS: 0 10*3/uL (ref 0.0–0.1)
BASOS PCT: 0.8 % (ref 0.0–3.0)
EOS ABS: 0.1 10*3/uL (ref 0.0–0.7)
Eosinophils Relative: 3.4 % (ref 0.0–5.0)
HCT: 37.2 % — ABNORMAL LOW (ref 39.0–52.0)
Hemoglobin: 12.7 g/dL — ABNORMAL LOW (ref 13.0–17.0)
Lymphocytes Relative: 20.3 % (ref 12.0–46.0)
Lymphs Abs: 0.9 10*3/uL (ref 0.7–4.0)
MCHC: 34.2 g/dL (ref 30.0–36.0)
MCV: 99 fl (ref 78.0–100.0)
MONOS PCT: 9.3 % (ref 3.0–12.0)
Monocytes Absolute: 0.4 10*3/uL (ref 0.1–1.0)
NEUTROS PCT: 66.2 % (ref 43.0–77.0)
Neutro Abs: 2.9 10*3/uL (ref 1.4–7.7)
PLATELETS: 221 10*3/uL (ref 150.0–400.0)
RBC: 3.75 Mil/uL — ABNORMAL LOW (ref 4.22–5.81)
RDW: 14.6 % (ref 11.5–15.5)
WBC: 4.3 10*3/uL (ref 4.0–10.5)

## 2018-01-04 LAB — COMPREHENSIVE METABOLIC PANEL
ALBUMIN: 4.4 g/dL (ref 3.5–5.2)
ALT: 15 U/L (ref 0–53)
AST: 17 U/L (ref 0–37)
Alkaline Phosphatase: 70 U/L (ref 39–117)
BILIRUBIN TOTAL: 0.9 mg/dL (ref 0.2–1.2)
BUN: 24 mg/dL — AB (ref 6–23)
CHLORIDE: 105 meq/L (ref 96–112)
CO2: 28 mEq/L (ref 19–32)
Calcium: 9.5 mg/dL (ref 8.4–10.5)
Creatinine, Ser: 1.56 mg/dL — ABNORMAL HIGH (ref 0.40–1.50)
GFR: 46.03 mL/min — AB (ref >60.00)
GLUCOSE: 112 mg/dL — AB (ref 70–99)
Potassium: 4.7 mEq/L (ref 3.5–5.1)
SODIUM: 141 meq/L (ref 135–145)
TOTAL PROTEIN: 6.8 g/dL (ref 6.0–8.3)

## 2018-01-04 LAB — LIPID PANEL
CHOL/HDL RATIO: 5
Cholesterol: 118 mg/dL (ref 0–200)
HDL: 25.9 mg/dL — ABNORMAL LOW (ref >39.00)
LDL Cholesterol: 59 mg/dL (ref 0–99)
NONHDL: 91.63
Triglycerides: 161 mg/dL — ABNORMAL HIGH (ref 0.0–149.0)
VLDL: 32.2 mg/dL (ref 0.0–40.0)

## 2018-01-04 LAB — MICROALBUMIN / CREATININE URINE RATIO
CREATININE, U: 61.7 mg/dL
MICROALB/CREAT RATIO: 5.2 mg/g (ref 0.0–30.0)
Microalb, Ur: 3.2 mg/dL — ABNORMAL HIGH (ref 0.0–1.9)

## 2018-01-04 LAB — TSH: TSH: 4.12 u[IU]/mL (ref 0.35–4.50)

## 2018-01-04 LAB — VITAMIN B12: VITAMIN B 12: 312 pg/mL (ref 211–911)

## 2018-01-04 LAB — VITAMIN D 25 HYDROXY (VIT D DEFICIENCY, FRACTURES): VITD: 22 ng/mL — AB (ref 30.00–100.00)

## 2018-01-04 MED ORDER — VITAMIN D (ERGOCALCIFEROL) 1.25 MG (50000 UNIT) PO CAPS: 50000.0000 [IU] | ORAL_CAPSULE | ORAL | 0 refills | Status: DC

## 2018-01-04 NOTE — Addendum Note (Signed)
Addended by: Elmer Picker on: 01/04/2018 08:10 AM   Modules accepted: Orders

## 2018-01-09 ENCOUNTER — Other Ambulatory Visit: Payer: Self-pay | Admitting: Internal Medicine

## 2018-01-09 DIAGNOSIS — E559 Vitamin D deficiency, unspecified: Secondary | ICD-10-CM

## 2018-01-15 ENCOUNTER — Encounter: Payer: Self-pay | Admitting: Family Medicine

## 2018-01-15 ENCOUNTER — Ambulatory Visit (INDEPENDENT_AMBULATORY_CARE_PROVIDER_SITE_OTHER): Payer: Medicare Other | Admitting: Family Medicine

## 2018-01-15 VITALS — BP 130/72 | HR 87 | Temp 97.9°F | Wt 234.2 lb

## 2018-01-15 DIAGNOSIS — E1122 Type 2 diabetes mellitus with diabetic chronic kidney disease: Secondary | ICD-10-CM

## 2018-01-15 DIAGNOSIS — N183 Chronic kidney disease, stage 3 unspecified: Secondary | ICD-10-CM

## 2018-01-15 DIAGNOSIS — I10 Essential (primary) hypertension: Secondary | ICD-10-CM | POA: Diagnosis not present

## 2018-01-15 LAB — GLUCOSE, POCT (MANUAL RESULT ENTRY): POC GLUCOSE: 186 mg/dL — AB (ref 70–99)

## 2018-01-18 ENCOUNTER — Encounter: Payer: Self-pay | Admitting: Family Medicine

## 2018-01-18 NOTE — Progress Notes (Signed)
   Subjective:    Patient ID: Rodney [name], male    DOB: 09-Feb-1940, 77 y.o.   MRN: [id number]  HPI Here for 4 days of intermittent light headedness which is more pronounced when he stands up from a sitting or lying position. This lasts a few seconds and then goes away. This is no spinning sensation. No SOB or chest pain. His diabetes has been well controlled and a week ago his A1c was down to 5.9. he was told to stop taking Metformin. His BP at home has been stable in the 412I to 786V systolic.    Review of Systems  Constitutional: Negative.   Respiratory: Negative.   Cardiovascular: Negative.   Neurological: Positive for light-headedness. Negative for dizziness and headaches.       Objective:   Physical Exam Constitutional:      Appearance: Normal appearance.  HENT:     Right Ear: Tympanic membrane and ear canal normal.     Left Ear: Tympanic membrane and ear canal normal.     Nose: Nose normal.     Mouth/Throat:     Pharynx: Oropharynx is clear.  Eyes:     Conjunctiva/sclera: Conjunctivae normal.  Neck:     Musculoskeletal: Normal range of motion.  Cardiovascular:     Rate and Rhythm: Normal rate and regular rhythm.     Pulses: Normal pulses.     Heart sounds: Normal heart sounds.  Pulmonary:     Effort: Pulmonary effort is normal.     Breath sounds: Normal breath sounds.  Lymphadenopathy:     Cervical: No cervical adenopathy.  Neurological:     Mental Status: He is alert.           Assessment & Plan:  He seems to be having some orthostatic episodes, so we will decrease the Losartan to 1/2 tablet (25 mg) daily. His diabetes is well controlled.  Alysia Penna, MD

## 2018-01-19 ENCOUNTER — Encounter: Payer: Self-pay | Admitting: Family Medicine

## 2018-01-19 ENCOUNTER — Ambulatory Visit (INDEPENDENT_AMBULATORY_CARE_PROVIDER_SITE_OTHER): Payer: Medicare Other | Admitting: Family Medicine

## 2018-01-19 VITALS — BP 130/84 | HR 88 | Temp 97.8°F | Wt 232.0 lb

## 2018-01-19 DIAGNOSIS — R0789 Other chest pain: Secondary | ICD-10-CM

## 2018-01-19 DIAGNOSIS — R42 Dizziness and giddiness: Secondary | ICD-10-CM | POA: Diagnosis not present

## 2018-01-19 DIAGNOSIS — I10 Essential (primary) hypertension: Secondary | ICD-10-CM

## 2018-01-19 MED ORDER — OMEPRAZOLE 20 MG PO CPDR: 20.0000 mg | DELAYED_RELEASE_CAPSULE | Freq: Every day | ORAL | 3 refills | Status: DC

## 2018-01-19 MED ORDER — ASPIRIN EC 81 MG PO TBEC: 81.0000 mg | DELAYED_RELEASE_TABLET | Freq: Every day | ORAL | 0 refills | Status: DC

## 2018-01-19 NOTE — Progress Notes (Signed)
   Subjective:    Patient ID: Rodney [name], male    DOB: 04/30/1940, 77 y.o.   MRN: [id number]  HPI Here to follow up on light headed spells. He was seen on 01-15-18 for this with a normal exam. We felt he may be having orthostatic spells so he was told to decrease the Losartan dose by half, which he did. He still describes feeling "swimmy headed" at times and now he also describes some intermittent chest pressure which he did not have last time. He often feels the need to belch, and this helps him feel better. No SOB or cough. The chest pressure is more prominent when he is up moving around than when sitting still.    Review of Systems     Objective:   Physical Exam Constitutional:      General: He is not in acute distress.    Appearance: Normal appearance.  Neck:     Musculoskeletal: Normal range of motion.  Cardiovascular:     Rate and Rhythm: Normal rate. Rhythm irregular.     Pulses: Normal pulses.     Heart sounds: Normal heart sounds. No murmur.     Comments: EKG shows sinus with occasional PACs, LVH, and left AF block.  Pulmonary:     Effort: Pulmonary effort is normal. No respiratory distress.     Breath sounds: Normal breath sounds. No stridor. No wheezing, rhonchi or rales.  Lymphadenopathy:     Cervical: No cervical adenopathy.  Neurological:     General: No focal deficit present.     Mental Status: He is alert and oriented to person, place, and time.     Cranial Nerves: No cranial nerve deficit.     Motor: No weakness.     Coordination: Coordination normal.     Gait: Gait normal.           Assessment & Plan:  He has had vague episodes of light headedness and now has mild chest pressure which seems to be related to exertion. He will rest this weekend. We will refer him to Cardiology to evaluate further. He will start taking 81 mg aspirin daily. In case this is GERD related, he will start on Prilosec 20 mg daily.  Alysia Penna, MD

## 2018-02-12 DIAGNOSIS — H401132 Primary open-angle glaucoma, bilateral, moderate stage: Secondary | ICD-10-CM | POA: Diagnosis not present

## 2018-02-15 ENCOUNTER — Encounter: Payer: Self-pay | Admitting: Interventional Cardiology

## 2018-02-26 DIAGNOSIS — H401131 Primary open-angle glaucoma, bilateral, mild stage: Secondary | ICD-10-CM | POA: Diagnosis not present

## 2018-03-05 NOTE — H&P (View-Only) (Signed)
Cardiology Office Note:    Date:  03/06/2018   ID:  Rodney [name] 05/13/1940, MRN [id number]  PCP:  Isaac Bliss, Rayford Halsted, MD  Cardiologist:  No primary care provider on file.   Referring MD: Laurey Morale, MD   Chief Complaint  Patient presents with  . Coronary Artery Disease    Angina    History of Present Illness:    Rodney [name] is a 78 y.o. male with a hx of diabetes mellitus type 2, hyperlipidemia, essential hypertension, Reiter's syndrome, who is referred by Dr. Loura Back.  Rodney [name] for evaluation of exertional chest pressure.  8 to 2-month history of exertional dyspnea.  This is particularly noticeable if he walks more than 100 yards up an incline.  This happens frequently because he is an avid fisherman and the boat ramp he has had an incline.  Since last year this time he has been noticing difficulty walking up the ramp.  Rest relieves the discomfort.  The discomfort does not occur at rest or with the motions.  Risk factors: Hypertension, type 2 diabetes, prior smoker, obesity, and possible sleep apnea.  Past Medical History:  Diagnosis Date  . COLONIC POLYPS, HX OF 12/02/2008  . DIABETES MELLITUS, TYPE II 07/19/2007  . Erectile dysfunction   . GERD (gastroesophageal reflux disease)   . Glaucoma   . GOUT 07/19/2007  . HYPERLIPIDEMIA 12/02/2008  . HYPERTENSION 07/19/2007  . Kidney stones   . Prostate cancer ([place])   . Reiter syndrome    incomplete  . Vitamin D deficiency     Past Surgical History:  Procedure Laterality Date  . bladder neck dilation    . CATARACT EXTRACTION     both eyes- 2015  . COLONOSCOPY    . ELBOW SURGERY     bone spur - right  . ORIF ANKLE FRACTURE Left 05/17/2015   Procedure: OPEN REDUCTION INTERNAL FIXATION (ORIF) ANKLE FRACTURE;  Surgeon: Meredith Pel, MD;  Location: [facility];  Service: Orthopedics;  Laterality: Left;  . PROSTATECTOMY    . VASECTOMY      Current Medications: Current Meds  Medication Sig  .  aspirin EC 81 MG tablet Take 1 tablet (81 mg total) by mouth daily.  Marland Kitchen atorvastatin (LIPITOR) 20 MG tablet TAKE 1 TABLET BY MOUTH ONCE DAILY  . dorzolamide-timolol (COSOPT) 22.3-6.8 MG/ML ophthalmic solution Place 1 drop into both eyes every morning.   Marland Kitchen glipiZIDE (GLIPIZIDE XL) 2.5 MG 24 hr tablet Take 1 tablet (2.5 mg total) by mouth daily with breakfast.  . glucose blood (ACCU-CHEK AVIVA) test strip 1 each by Other route 2 (two) times daily.  Marland Kitchen latanoprost (XALATAN) 0.005 % ophthalmic solution Place 1 drop into both eyes at bedtime.   Marland Kitchen losartan (COZAAR) 50 MG tablet TAKE 1 TABLET BY MOUTH ONCE DAILY  . omeprazole (PRILOSEC) 20 MG capsule Take 1 capsule (20 mg total) by mouth daily.  . pioglitazone (ACTOS) 45 MG tablet TAKE 1 TABLET BY MOUTH ONCE DAILY  . Vitamin D, Ergocalciferol, (DRISDOL) 1.25 MG (50000 UT) CAPS capsule Take 1 capsule (50,000 Units total) by mouth every 7 (seven) days.     Allergies:   Ace inhibitors   Social History   Socioeconomic History  . Marital status: Married    Spouse name: Not on file  . Number of children: Not on file  . Years of education: Not on file  . Highest education level: Not on file  Occupational History  . Not on  file  Social Needs  . Financial resource strain: Not on file  . Food insecurity:    Worry: Not on file    Inability: Not on file  . Transportation needs:    Medical: Not on file    Non-medical: Not on file  Tobacco Use  . Smoking status: Former Smoker    Types: Cigarettes    Last attempt to quit: 01/24/1978    Years since quitting: 40.1  . Smokeless tobacco: Never Used  Substance and Sexual Activity  . Alcohol use: Yes    Comment: occasionally -socially  . Drug use: No  . Sexual activity: Not on file    Comment: 2 childern  Lifestyle  . Physical activity:    Days per week: Not on file    Minutes per session: Not on file  . Stress: Not on file  Relationships  . Social connections:    Talks on phone: Not on file     Gets together: Not on file    Attends religious service: Not on file    Active member of club or organization: Not on file    Attends meetings of clubs or organizations: Not on file    Relationship status: Not on file  Other Topics Concern  . Not on file  Social History Narrative  . Not on file     Family History: The patient's family history includes Cancer in his sister. There is no history of Colon cancer, Esophageal cancer, Rectal cancer, or Stomach cancer.  ROS:   Please see the history of present illness.    Episodes of dizziness that sound like vertigo.  Shortness of breath with activity.  No prior cardiac evaluation.  All other systems reviewed and are negative.  EKGs/Labs/Other Studies Reviewed:    The following studies were reviewed today: No cardiac evaluation.  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  When compared to 2017, QRS duration has increased  Recent Labs: 01/04/2018: ALT 15; BUN 24; Creatinine, Ser 1.56; Hemoglobin 12.7; Platelets 221.0; Potassium 4.7; Sodium 141; TSH 4.12  Recent Lipid Panel    Component Value Date/Time   CHOL 118 01/04/2018 0810   TRIG 161.0 (H) 01/04/2018 0810   HDL 25.90 (L) 01/04/2018 0810   CHOLHDL 5 01/04/2018 0810   VLDL 32.2 01/04/2018 0810   LDLCALC 59 01/04/2018 0810   LDLDIRECT 166.1 09/19/2013 0922    Physical Exam:    VS:  BP (!) 172/92   Pulse 78   Ht 5\' 8"  (1.727 m)   Wt 232 lb 9.6 oz (105.5 kg)   SpO2 97%   BMI 35.37 kg/m     Wt Readings from Last 3 Encounters:  03/06/18 232 lb 9.6 oz (105.5 kg)  01/19/18 232 lb (105.2 kg)  01/15/18 234 lb 4 oz (106.3 kg)     GEN: Abdominal obesity. No acute distress HEENT: Normal NECK: No JVD. LYMPHATICS: No lymphadenopathy CARDIAC: RRR.  1/6 systolic right upper sternal border murmur, S4 gallop, no edema VASCULAR: Diminished pulses in both feet.  2+ bilateral popliteal  pulses, no bruits RESPIRATORY:  Clear to auscultation without rales, wheezing or rhonchi  ABDOMEN: Soft, non-tender, non-distended, No pulsatile mass, MUSCULOSKELETAL: No deformity  SKIN: Warm and dry NEUROLOGIC:  Alert and oriented x 3 PSYCHIATRIC:  Normal affect   ASSESSMENT:    1. Chest pressure   2. Type 2 diabetes mellitus with stage 3 chronic kidney disease, without  long-term current use of insulin ([place])   3. Dyslipidemia   4. Essential hypertension   5. Chronic kidney disease (CKD), stage III (moderate) (HCC)    PLAN:    In order of problems listed above:  1. Angina pectoris in a patient with multiple risk factors.  Have counseled to undergo coronary angiography to define full extent of disease.  The procedure including the risk of stroke, death, myocardial infarction, heart attack, kidney injury, contrast allergy, limb ischemia, among others were discussed in detail.  After considering the pros and cons the patient has decided to proceed with coronary angiography.  Stress testing would not be very helpful because if the test is normal it would not exclude coronary disease and given his body habitus, could be fraught with soft tissue attenuation. 2. Hemoglobin A1c target less than 7 is being achieved 3. LDL target less than 70 is being achieved. 4. Blood pressure control is not good.  I have chosen to use amlodipine 5 mg/day to get a more immediate blood pressure response prior to cath.  We will add beta-blocker therapy thereafter if appropriate. 5. Will need 2 hours of hydration prior to coronary angiography.  The patient was counseled to undergo left heart catheterization, coronary angiography, and possible percutaneous coronary intervention with stent implantation. The procedural risks and benefits were discussed in detail. The risks discussed included death, stroke, myocardial infarction, life-threatening bleeding, limb ischemia, kidney injury, allergy, and possible emergency  cardiac surgery. The risk of these significant complications were estimated to occur less than 1% of the time. After discussion, the patient has agreed to proceed.  Continue aspirin 81 mg/day.  Sublingual nitroglycerin if prolonged pain not relieved by rest.   Medication Adjustments/Labs and Tests Ordered: Current medicines are reviewed at length with the patient today.  Concerns regarding medicines are outlined above.  Orders Placed This Encounter  Procedures  . CBC  . Basic metabolic panel   Meds ordered this encounter  Medications  . amLODipine (NORVASC) 5 MG tablet    Sig: Take 1 tablet (5 mg total) by mouth daily.    Dispense:  90 tablet    Refill:  3    Patient Instructions  Medication Instructions:  1) START Amlodipine 5mg  once daily  If you need a refill on your cardiac medications before your next appointment, please call your pharmacy.   Lab work: Your physician recommends that you return for lab work in: 7-10 days (CBC, BMET)  If you have labs (blood work) drawn today and your tests are completely normal, you will receive your results only by: Marland Kitchen MyChart Message (if you have MyChart) OR . A paper copy in the mail If you have any lab test that is abnormal or we need to change your treatment, we will call you to review the results.  Testing/Procedures: Your physician has requested that you have a cardiac catheterization. Cardiac catheterization is used to diagnose and/or treat various heart conditions. Doctors may recommend this procedure for a number of different reasons. The most common reason is to evaluate chest pain. Chest pain can be a symptom of coronary artery disease (CAD), and cardiac catheterization can show whether plaque is narrowing or blocking your heart's arteries. This procedure is also used to evaluate the valves, as well as measure the blood flow and oxygen levels in different parts of your heart. For further information please visit [email or web address].  Please follow instruction sheet, as given.   Follow-Up: At [facility], you and your health needs  are our priority.  As part of our continuing mission to provide you with exceptional heart care, we have created designated Provider Care Teams.  These Care Teams include your primary Cardiologist (physician) and Advanced Practice Providers (APPs -  Physician Assistants and Nurse Practitioners) who all work together to provide you with the care you need, when you need it. You will need a follow up appointment in 2-3 weeks after heart cath scheduled for 03/19/2018.  Please call our office 2 months in advance to schedule this appointment.  You may see Dr. Tamala Julian or one of the following Advanced Practice Providers on your designated Care Team:   Truitt Merle, NP Cecilie Kicks, NP . Kathyrn Drown, NP  Any Other Special Instructions Will Be Listed Below (If Applicable).     [place] OFFICE [place], [place] [address] Dept: [place]: [place]  03/06/2018  You are scheduled for a Cardiac Catheterization on Monday, February 24 with Dr. Daneen Schick.  1. Please arrive at the [facility] (Main Entrance A) at [facility]: [address] [place], [place] [address] at 5:30 AM (This time is two hours before your procedure to ensure your preparation). Free valet parking service is available.   Special note: Every effort is made to have your procedure done on time. Please understand that emergencies sometimes delay scheduled procedures.  2. Diet: Do not eat solid foods after midnight.  The patient may have clear liquids until 5am upon the day of the procedure.  3. Labs: You will need to have blood drawn within 7 days of your heart catheterization. You do not need to be fasting.  4. Medication instructions in preparation for your procedure:   Contrast Allergy:  No  Please hold your Glipizide the morning of your procedure.   On the morning of your procedure, take your Aspirin and any morning medicines NOT listed above.  You may use sips of water.  5. Plan for one night stay--bring personal belongings. 6. Bring a current list of your medications and current insurance cards. 7. You MUST have a responsible person to drive you home. 8. Someone MUST be with you the first 24 hours after you arrive home or your discharge will be delayed. 9. Please wear clothes that are easy to get on and off and wear slip-on shoes.  Thank you for allowing [place] to care for you!   -- [facility]       Signed, Sinclair Grooms, MD  03/06/2018 1:30 PM    [place]

## 2018-03-05 NOTE — Progress Notes (Signed)
Cardiology Office Note:    Date:  03/06/2018   ID:  Rodney, [name] 08-31-1940, MRN [id number]  PCP:  Isaac Bliss, Rayford Halsted, MD  Cardiologist:  No primary care provider on file.   Referring MD: Laurey Morale, MD   Chief Complaint  Patient presents with  . Coronary Artery Disease    Angina    History of Present Illness:    Rodney [name] is a 78 y.o. male with a hx of diabetes mellitus type 2, hyperlipidemia, essential hypertension, Reiter's syndrome, who is referred by Dr. Loura Back.  Rodney [name] for evaluation of exertional chest pressure.  8 to 35-month history of exertional dyspnea.  This is particularly noticeable if he walks more than 100 yards up an incline.  This happens frequently because he is an avid fisherman and the boat ramp he has had an incline.  Since last year this time he has been noticing difficulty walking up the ramp.  Rest relieves the discomfort.  The discomfort does not occur at rest or with the motions.  Risk factors: Hypertension, type 2 diabetes, prior smoker, obesity, and possible sleep apnea.  Past Medical History:  Diagnosis Date  . COLONIC POLYPS, HX OF 12/02/2008  . DIABETES MELLITUS, TYPE II 07/19/2007  . Erectile dysfunction   . GERD (gastroesophageal reflux disease)   . Glaucoma   . GOUT 07/19/2007  . HYPERLIPIDEMIA 12/02/2008  . HYPERTENSION 07/19/2007  . Kidney stones   . Prostate cancer ([place])   . Reiter syndrome    incomplete  . Vitamin D deficiency     Past Surgical History:  Procedure Laterality Date  . bladder neck dilation    . CATARACT EXTRACTION     both eyes- 2015  . COLONOSCOPY    . ELBOW SURGERY     bone spur - right  . ORIF ANKLE FRACTURE Left 05/17/2015   Procedure: OPEN REDUCTION INTERNAL FIXATION (ORIF) ANKLE FRACTURE;  Surgeon: Meredith Pel, MD;  Location: [facility];  Service: Orthopedics;  Laterality: Left;  . PROSTATECTOMY    . VASECTOMY      Current Medications: Current Meds  Medication Sig  .  aspirin EC 81 MG tablet Take 1 tablet (81 mg total) by mouth daily.  Marland Kitchen atorvastatin (LIPITOR) 20 MG tablet TAKE 1 TABLET BY MOUTH ONCE DAILY  . dorzolamide-timolol (COSOPT) 22.3-6.8 MG/ML ophthalmic solution Place 1 drop into both eyes every morning.   Marland Kitchen glipiZIDE (GLIPIZIDE XL) 2.5 MG 24 hr tablet Take 1 tablet (2.5 mg total) by mouth daily with breakfast.  . glucose blood (ACCU-CHEK AVIVA) test strip 1 each by Other route 2 (two) times daily.  Marland Kitchen latanoprost (XALATAN) 0.005 % ophthalmic solution Place 1 drop into both eyes at bedtime.   Marland Kitchen losartan (COZAAR) 50 MG tablet TAKE 1 TABLET BY MOUTH ONCE DAILY  . omeprazole (PRILOSEC) 20 MG capsule Take 1 capsule (20 mg total) by mouth daily.  . pioglitazone (ACTOS) 45 MG tablet TAKE 1 TABLET BY MOUTH ONCE DAILY  . Vitamin D, Ergocalciferol, (DRISDOL) 1.25 MG (50000 UT) CAPS capsule Take 1 capsule (50,000 Units total) by mouth every 7 (seven) days.     Allergies:   Ace inhibitors   Social History   Socioeconomic History  . Marital status: Married    Spouse name: Not on file  . Number of children: Not on file  . Years of education: Not on file  . Highest education level: Not on file  Occupational History  . Not on  file  Social Needs  . Financial resource strain: Not on file  . Food insecurity:    Worry: Not on file    Inability: Not on file  . Transportation needs:    Medical: Not on file    Non-medical: Not on file  Tobacco Use  . Smoking status: Former Smoker    Types: Cigarettes    Last attempt to quit: 01/24/1978    Years since quitting: 40.1  . Smokeless tobacco: Never Used  Substance and Sexual Activity  . Alcohol use: Yes    Comment: occasionally -socially  . Drug use: No  . Sexual activity: Not on file    Comment: 2 childern  Lifestyle  . Physical activity:    Days per week: Not on file    Minutes per session: Not on file  . Stress: Not on file  Relationships  . Social connections:    Talks on phone: Not on file     Gets together: Not on file    Attends religious service: Not on file    Active member of club or organization: Not on file    Attends meetings of clubs or organizations: Not on file    Relationship status: Not on file  Other Topics Concern  . Not on file  Social History Narrative  . Not on file     Family History: The patient's family history includes Cancer in his sister. There is no history of Colon cancer, Esophageal cancer, Rectal cancer, or Stomach cancer.  ROS:   Please see the history of present illness.    Episodes of dizziness that sound like vertigo.  Shortness of breath with activity.  No prior cardiac evaluation.  All other systems reviewed and are negative.  EKGs/Labs/Other Studies Reviewed:    The following studies were reviewed today: No cardiac evaluation.  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  When compared to 2017, QRS duration has increased  Recent Labs: 01/04/2018: ALT 15; BUN 24; Creatinine, Ser 1.56; Hemoglobin 12.7; Platelets 221.0; Potassium 4.7; Sodium 141; TSH 4.12  Recent Lipid Panel    Component Value Date/Time   CHOL 118 01/04/2018 0810   TRIG 161.0 (H) 01/04/2018 0810   HDL 25.90 (L) 01/04/2018 0810   CHOLHDL 5 01/04/2018 0810   VLDL 32.2 01/04/2018 0810   LDLCALC 59 01/04/2018 0810   LDLDIRECT 166.1 09/19/2013 0922    Physical Exam:    VS:  BP (!) 172/92   Pulse 78   Ht 5\' 8"  (1.727 m)   Wt 232 lb 9.6 oz (105.5 kg)   SpO2 97%   BMI 35.37 kg/m     Wt Readings from Last 3 Encounters:  03/06/18 232 lb 9.6 oz (105.5 kg)  01/19/18 232 lb (105.2 kg)  01/15/18 234 lb 4 oz (106.3 kg)     GEN: Abdominal obesity. No acute distress HEENT: Normal NECK: No JVD. LYMPHATICS: No lymphadenopathy CARDIAC: RRR.  1/6 systolic right upper sternal border murmur, S4 gallop, no edema VASCULAR: Diminished pulses in both feet.  2+ bilateral popliteal  pulses, no bruits RESPIRATORY:  Clear to auscultation without rales, wheezing or rhonchi  ABDOMEN: Soft, non-tender, non-distended, No pulsatile mass, MUSCULOSKELETAL: No deformity  SKIN: Warm and dry NEUROLOGIC:  Alert and oriented x 3 PSYCHIATRIC:  Normal affect   ASSESSMENT:    1. Chest pressure   2. Type 2 diabetes mellitus with stage 3 chronic kidney disease, without  long-term current use of insulin ([place])   3. Dyslipidemia   4. Essential hypertension   5. Chronic kidney disease (CKD), stage III (moderate) (HCC)    PLAN:    In order of problems listed above:  1. Angina pectoris in a patient with multiple risk factors.  Have counseled to undergo coronary angiography to define full extent of disease.  The procedure including the risk of stroke, death, myocardial infarction, heart attack, kidney injury, contrast allergy, limb ischemia, among others were discussed in detail.  After considering the pros and cons the patient has decided to proceed with coronary angiography.  Stress testing would not be very helpful because if the test is normal it would not exclude coronary disease and given his body habitus, could be fraught with soft tissue attenuation. 2. Hemoglobin A1c target less than 7 is being achieved 3. LDL target less than 70 is being achieved. 4. Blood pressure control is not good.  I have chosen to use amlodipine 5 mg/day to get a more immediate blood pressure response prior to cath.  We will add beta-blocker therapy thereafter if appropriate. 5. Will need 2 hours of hydration prior to coronary angiography.  The patient was counseled to undergo left heart catheterization, coronary angiography, and possible percutaneous coronary intervention with stent implantation. The procedural risks and benefits were discussed in detail. The risks discussed included death, stroke, myocardial infarction, life-threatening bleeding, limb ischemia, kidney injury, allergy, and possible emergency  cardiac surgery. The risk of these significant complications were estimated to occur less than 1% of the time. After discussion, the patient has agreed to proceed.  Continue aspirin 81 mg/day.  Sublingual nitroglycerin if prolonged pain not relieved by rest.   Medication Adjustments/Labs and Tests Ordered: Current medicines are reviewed at length with the patient today.  Concerns regarding medicines are outlined above.  Orders Placed This Encounter  Procedures  . CBC  . Basic metabolic panel   Meds ordered this encounter  Medications  . amLODipine (NORVASC) 5 MG tablet    Sig: Take 1 tablet (5 mg total) by mouth daily.    Dispense:  90 tablet    Refill:  3    Patient Instructions  Medication Instructions:  1) START Amlodipine 5mg  once daily  If you need a refill on your cardiac medications before your next appointment, please call your pharmacy.   Lab work: Your physician recommends that you return for lab work in: 7-10 days (CBC, BMET)  If you have labs (blood work) drawn today and your tests are completely normal, you will receive your results only by: Marland Kitchen MyChart Message (if you have MyChart) OR . A paper copy in the mail If you have any lab test that is abnormal or we need to change your treatment, we will call you to review the results.  Testing/Procedures: Your physician has requested that you have a cardiac catheterization. Cardiac catheterization is used to diagnose and/or treat various heart conditions. Doctors may recommend this procedure for a number of different reasons. The most common reason is to evaluate chest pain. Chest pain can be a symptom of coronary artery disease (CAD), and cardiac catheterization can show whether plaque is narrowing or blocking your heart's arteries. This procedure is also used to evaluate the valves, as well as measure the blood flow and oxygen levels in different parts of your heart. For further information please visit HugeFiesta.tn.  Please follow instruction sheet, as given.   Follow-Up: At [facility], you and your health needs  are our priority.  As part of our continuing mission to provide you with exceptional heart care, we have created designated Provider Care Teams.  These Care Teams include your primary Cardiologist (physician) and Advanced Practice Providers (APPs -  Physician Assistants and Nurse Practitioners) who all work together to provide you with the care you need, when you need it. You will need a follow up appointment in 2-3 weeks after heart cath scheduled for 03/19/2018.  Please call our office 2 months in advance to schedule this appointment.  You may see Dr. Tamala Julian or one of the following Advanced Practice Providers on your designated Care Team:   Truitt Merle, NP Cecilie Kicks, NP . Kathyrn Drown, NP  Any Other Special Instructions Will Be Listed Below (If Applicable).     [place] OFFICE [place], [place] [address] Dept: [place]: [place]  03/06/2018  You are scheduled for a Cardiac Catheterization on Monday, February 24 with Dr. Daneen Schick.  1. Please arrive at the [facility] (Main Entrance A) at [facility]: [address] [place], [place] [address] at 5:30 AM (This time is two hours before your procedure to ensure your preparation). Free valet parking service is available.   Special note: Every effort is made to have your procedure done on time. Please understand that emergencies sometimes delay scheduled procedures.  2. Diet: Do not eat solid foods after midnight.  The patient may have clear liquids until 5am upon the day of the procedure.  3. Labs: You will need to have blood drawn within 7 days of your heart catheterization. You do not need to be fasting.  4. Medication instructions in preparation for your procedure:   Contrast Allergy:  No  Please hold your Glipizide the morning of your procedure.   On the morning of your procedure, take your Aspirin and any morning medicines NOT listed above.  You may use sips of water.  5. Plan for one night stay--bring personal belongings. 6. Bring a current list of your medications and current insurance cards. 7. You MUST have a responsible person to drive you home. 8. Someone MUST be with you the first 24 hours after you arrive home or your discharge will be delayed. 9. Please wear clothes that are easy to get on and off and wear slip-on shoes.  Thank you for allowing [place] to care for you!   -- [place] [facility]       Signed, Sinclair Grooms, MD  03/06/2018 1:30 PM    [place]

## 2018-03-06 ENCOUNTER — Encounter (INDEPENDENT_AMBULATORY_CARE_PROVIDER_SITE_OTHER): Payer: Self-pay

## 2018-03-06 ENCOUNTER — Ambulatory Visit: Payer: Medicare Other | Admitting: Interventional Cardiology

## 2018-03-06 ENCOUNTER — Encounter: Payer: Self-pay | Admitting: Interventional Cardiology

## 2018-03-06 VITALS — BP 172/92 | HR 78 | Ht 68.0 in | Wt 232.6 lb

## 2018-03-06 DIAGNOSIS — R0789 Other chest pain: Secondary | ICD-10-CM | POA: Diagnosis not present

## 2018-03-06 DIAGNOSIS — N183 Chronic kidney disease, stage 3 unspecified: Secondary | ICD-10-CM

## 2018-03-06 DIAGNOSIS — E1122 Type 2 diabetes mellitus with diabetic chronic kidney disease: Secondary | ICD-10-CM

## 2018-03-06 DIAGNOSIS — E785 Hyperlipidemia, unspecified: Secondary | ICD-10-CM

## 2018-03-06 DIAGNOSIS — I10 Essential (primary) hypertension: Secondary | ICD-10-CM

## 2018-03-06 MED ORDER — AMLODIPINE BESYLATE 5 MG PO TABS: 5.0000 mg | ORAL_TABLET | Freq: Every day | ORAL | 3 refills | Status: DC

## 2018-03-06 NOTE — Patient Instructions (Signed)
Medication Instructions:  1) START Amlodipine 5mg  once daily  If you need a refill on your cardiac medications before your next appointment, please call your pharmacy.   Lab work: Your physician recommends that you return for lab work in: 7-10 days (CBC, BMET)  If you have labs (blood work) drawn today and your tests are completely normal, you will receive your results only by: Marland Kitchen MyChart Message (if you have MyChart) OR . A paper copy in the mail If you have any lab test that is abnormal or we need to change your treatment, we will call you to review the results.  Testing/Procedures: Your physician has requested that you have a cardiac catheterization. Cardiac catheterization is used to diagnose and/or treat various heart conditions. Doctors may recommend this procedure for a number of different reasons. The most common reason is to evaluate chest pain. Chest pain can be a symptom of coronary artery disease (CAD), and cardiac catheterization can show whether plaque is narrowing or blocking your heart's arteries. This procedure is also used to evaluate the valves, as well as measure the blood flow and oxygen levels in different parts of your heart. For further information please visit HugeFiesta.tn. Please follow instruction sheet, as given.   Follow-Up: At [facility], you and your health needs are our priority.  As part of our continuing mission to provide you with exceptional heart care, we have created designated Provider Care Teams.  These Care Teams include your primary Cardiologist (physician) and Advanced Practice Providers (APPs -  Physician Assistants and Nurse Practitioners) who all work together to provide you with the care you need, when you need it. You will need a follow up appointment in 2-3 weeks after heart cath scheduled for 03/19/2018.  Please call our office 2 months in advance to schedule this appointment.  You may see Dr. Tamala Julian or one of the following Advanced  Practice Providers on your designated Care Team:   Truitt Merle, NP Cecilie Kicks, NP . Kathyrn Drown, NP  Any Other Special Instructions Will Be Listed Below (If Applicable).     [place] OFFICE [employer], [place] [address] Dept: [place]: [place]  03/06/2018  You are scheduled for a Cardiac Catheterization on Monday, February 24 with Dr. Daneen Schick.  1. Please arrive at the [facility] (Main Entrance A) at [facility]: [address] [place], [place] [address] at 5:30 AM (This time is two hours before your procedure to ensure your preparation). Free valet parking service is available.   Special note: Every effort is made to have your procedure done on time. Please understand that emergencies sometimes delay scheduled procedures.  2. Diet: Do not eat solid foods after midnight.  The patient may have clear liquids until 5am upon the day of the procedure.  3. Labs: You will need to have blood drawn within 7 days of your heart catheterization. You do not need to be fasting.  4. Medication instructions in preparation for your procedure:   Contrast Allergy: No  Please hold your Glipizide the morning of your procedure.   On the morning of your procedure, take your Aspirin and any morning medicines NOT listed above.  You may use sips of water.  5. Plan for one night stay--bring personal belongings. 6. Bring a current list of your medications and current insurance cards. 7. You MUST have a responsible person to drive  you home. 8. Someone MUST be with you the first 24 hours after you arrive home or your discharge will be delayed. 9. Please wear clothes that are easy to get on and off and wear slip-on shoes.  Thank you for allowing [place] to care for you!   -- [place] [facility]

## 2018-03-12 ENCOUNTER — Other Ambulatory Visit: Payer: Self-pay | Admitting: Internal Medicine

## 2018-03-13 ENCOUNTER — Other Ambulatory Visit: Payer: Medicare Other

## 2018-03-13 DIAGNOSIS — R0789 Other chest pain: Secondary | ICD-10-CM

## 2018-03-13 DIAGNOSIS — I10 Essential (primary) hypertension: Secondary | ICD-10-CM

## 2018-03-13 DIAGNOSIS — N183 Chronic kidney disease, stage 3 unspecified: Secondary | ICD-10-CM

## 2018-03-14 LAB — BASIC METABOLIC PANEL
BUN / CREAT RATIO: 14 (ref 10–24)
BUN: 22 mg/dL (ref 8–27)
CHLORIDE: 105 mmol/L (ref 96–106)
CO2: 22 mmol/L (ref 20–29)
CREATININE: 1.57 mg/dL — AB (ref 0.76–1.27)
Calcium: 9.3 mg/dL (ref 8.6–10.2)
GFR calc Af Amer: 48 mL/min/{1.73_m2} — ABNORMAL LOW (ref >59)
GFR calc non Af Amer: 42 mL/min/{1.73_m2} — ABNORMAL LOW (ref >59)
GLUCOSE: 160 mg/dL — AB (ref 65–99)
Potassium: 4.6 mmol/L (ref 3.5–5.2)
SODIUM: 141 mmol/L (ref 134–144)

## 2018-03-14 LAB — CBC
Hematocrit: 34.9 % — ABNORMAL LOW (ref 37.5–51.0)
Hemoglobin: 11.6 g/dL — ABNORMAL LOW (ref 13.0–17.7)
MCH: 33.2 pg — ABNORMAL HIGH (ref 26.6–33.0)
MCHC: 33.2 g/dL (ref 31.5–35.7)
MCV: 100 fL — AB (ref 79–97)
PLATELETS: 280 10*3/uL (ref 150–450)
RBC: 3.49 x10E6/uL — ABNORMAL LOW (ref 4.14–5.80)
RDW: 12.3 % (ref 11.6–15.4)
WBC: 4.7 10*3/uL (ref 3.4–10.8)

## 2018-03-15 ENCOUNTER — Telehealth: Payer: Self-pay | Admitting: *Deleted

## 2018-03-15 NOTE — Telephone Encounter (Addendum)
Pt contacted pre-catheterization scheduled at [facility] for: Monday March 19, 2018 10:30 AM Verified arrival time and place: [place] Entrance A at: 5:30 AM-pre procedure hydration  No solid food after midnight prior to cath, clear liquids until 5 AM day of procedure. Contrast allergy: no  Hold: Actos-AM of procedure. Glipizide-AM of procedure. Losartan-AM of procedure.   Except hold medications AM meds can be  taken pre-cath with sip of water including: ASA 81 mg  Confirmed patient has responsible person to drive home post procedure and observe 24 hours after arriving home: yes

## 2018-03-19 ENCOUNTER — Other Ambulatory Visit: Payer: Self-pay | Admitting: Internal Medicine

## 2018-03-19 ENCOUNTER — Ambulatory Visit (HOSPITAL_COMMUNITY)
Admission: RE | Admit: 2018-03-19 | Discharge: 2018-03-19 | Disposition: A | Discharge status: Home / Self Care | Payer: Medicare Other | Attending: Interventional Cardiology | Admitting: Interventional Cardiology

## 2018-03-19 ENCOUNTER — Encounter (HOSPITAL_COMMUNITY): Payer: Self-pay | Admitting: Interventional Cardiology

## 2018-03-19 ENCOUNTER — Encounter (HOSPITAL_COMMUNITY)
Admission: RE | Disposition: A | Discharge status: Home / Self Care | Payer: Self-pay | Source: Home / Self Care | Attending: Interventional Cardiology

## 2018-03-19 DIAGNOSIS — N183 Chronic kidney disease, stage 3 unspecified: Secondary | ICD-10-CM | POA: Diagnosis present

## 2018-03-19 DIAGNOSIS — Z6835 Body mass index (BMI) 35.0-35.9, adult: Secondary | ICD-10-CM | POA: Insufficient documentation

## 2018-03-19 DIAGNOSIS — E1122 Type 2 diabetes mellitus with diabetic chronic kidney disease: Secondary | ICD-10-CM | POA: Diagnosis present

## 2018-03-19 DIAGNOSIS — I25119 Atherosclerotic heart disease of native coronary artery with unspecified angina pectoris: Secondary | ICD-10-CM | POA: Insufficient documentation

## 2018-03-19 DIAGNOSIS — E559 Vitamin D deficiency, unspecified: Secondary | ICD-10-CM | POA: Insufficient documentation

## 2018-03-19 DIAGNOSIS — Z87891 Personal history of nicotine dependence: Secondary | ICD-10-CM | POA: Diagnosis not present

## 2018-03-19 DIAGNOSIS — I129 Hypertensive chronic kidney disease with stage 1 through stage 4 chronic kidney disease, or unspecified chronic kidney disease: Secondary | ICD-10-CM | POA: Insufficient documentation

## 2018-03-19 DIAGNOSIS — E669 Obesity, unspecified: Secondary | ICD-10-CM | POA: Insufficient documentation

## 2018-03-19 DIAGNOSIS — M109 Gout, unspecified: Secondary | ICD-10-CM | POA: Diagnosis not present

## 2018-03-19 DIAGNOSIS — Z7982 Long term (current) use of aspirin: Secondary | ICD-10-CM | POA: Insufficient documentation

## 2018-03-19 DIAGNOSIS — I10 Essential (primary) hypertension: Secondary | ICD-10-CM | POA: Diagnosis present

## 2018-03-19 DIAGNOSIS — I2510 Atherosclerotic heart disease of native coronary artery without angina pectoris: Secondary | ICD-10-CM | POA: Diagnosis not present

## 2018-03-19 DIAGNOSIS — H409 Unspecified glaucoma: Secondary | ICD-10-CM | POA: Diagnosis not present

## 2018-03-19 DIAGNOSIS — Z8546 Personal history of malignant neoplasm of prostate: Secondary | ICD-10-CM | POA: Diagnosis not present

## 2018-03-19 DIAGNOSIS — Z79899 Other long term (current) drug therapy: Secondary | ICD-10-CM | POA: Diagnosis not present

## 2018-03-19 DIAGNOSIS — E785 Hyperlipidemia, unspecified: Secondary | ICD-10-CM | POA: Diagnosis not present

## 2018-03-19 DIAGNOSIS — K219 Gastro-esophageal reflux disease without esophagitis: Secondary | ICD-10-CM | POA: Diagnosis not present

## 2018-03-19 DIAGNOSIS — R0609 Other forms of dyspnea: Secondary | ICD-10-CM

## 2018-03-19 DIAGNOSIS — Z7984 Long term (current) use of oral hypoglycemic drugs: Secondary | ICD-10-CM | POA: Diagnosis not present

## 2018-03-19 DIAGNOSIS — C61 Malignant neoplasm of prostate: Secondary | ICD-10-CM | POA: Diagnosis present

## 2018-03-19 HISTORY — PX: LEFT HEART CATH AND CORONARY ANGIOGRAPHY: CATH118249

## 2018-03-19 LAB — GLUCOSE, CAPILLARY
Glucose-Capillary: 116 mg/dL — ABNORMAL HIGH (ref 70–99)
Glucose-Capillary: 104 mg/dL — ABNORMAL HIGH (ref 70–99)

## 2018-03-19 SURGERY — LEFT HEART CATH AND CORONARY ANGIOGRAPHY
Anesthesia: LOCAL

## 2018-03-19 MED ORDER — MIDAZOLAM HCL 2 MG/2ML IJ SOLN
INTRAMUSCULAR | Status: DC | PRN
  Administered 2018-03-19 (×3): 0.5 mg via INTRAVENOUS

## 2018-03-19 MED ORDER — ASPIRIN 81 MG PO CHEW
81.0000 mg | CHEWABLE_TABLET | ORAL | Status: AC
  Administered 2018-03-19: 81 mg via ORAL
  Filled 2018-03-19: qty 1

## 2018-03-19 MED ORDER — SODIUM CHLORIDE 0.9 % WEIGHT BASED INFUSION
3.0000 mL/kg/h | INTRAVENOUS | Status: DC
  Administered 2018-03-19: 3 mL/kg/h via INTRAVENOUS

## 2018-03-19 MED ORDER — OXYCODONE HCL 5 MG PO TABS: 5.0000 mg | ORAL_TABLET | ORAL | Status: DC | PRN

## 2018-03-19 MED ORDER — SODIUM CHLORIDE 0.9% FLUSH: 3.0000 mL | INTRAVENOUS | Status: DC | PRN

## 2018-03-19 MED ORDER — ATORVASTATIN CALCIUM 20 MG PO TABS: 20.0000 mg | ORAL_TABLET | Freq: Every day | ORAL | 0 refills | Status: DC

## 2018-03-19 MED ORDER — SODIUM CHLORIDE 0.9% FLUSH: 3.0000 mL | Freq: Two times a day (BID) | INTRAVENOUS | Status: DC

## 2018-03-19 MED ORDER — IOHEXOL 350 MG/ML SOLN
INTRAVENOUS | Status: DC | PRN
  Administered 2018-03-19: 90 mL via INTRA_ARTERIAL

## 2018-03-19 MED ORDER — HEPARIN (PORCINE) IN NACL 1000-0.9 UT/500ML-% IV SOLN
INTRAVENOUS | Status: AC
  Filled 2018-03-19: qty 1000

## 2018-03-19 MED ORDER — LIDOCAINE HCL (PF) 1 % IJ SOLN
INTRAMUSCULAR | Status: DC | PRN
  Administered 2018-03-19: 2 mL

## 2018-03-19 MED ORDER — SODIUM CHLORIDE 0.9 % IV SOLN: INTRAVENOUS | Status: DC

## 2018-03-19 MED ORDER — SODIUM CHLORIDE 0.9 % IV SOLN: 250.0000 mL | INTRAVENOUS | Status: DC | PRN

## 2018-03-19 MED ORDER — HEPARIN (PORCINE) IN NACL 1000-0.9 UT/500ML-% IV SOLN
INTRAVENOUS | Status: DC | PRN
  Administered 2018-03-19: 500 mL

## 2018-03-19 MED ORDER — HEPARIN SODIUM (PORCINE) 1000 UNIT/ML IJ SOLN
INTRAMUSCULAR | Status: DC | PRN
  Administered 2018-03-19: 5000 [IU] via INTRAVENOUS

## 2018-03-19 MED ORDER — VERAPAMIL HCL 2.5 MG/ML IV SOLN
INTRAVENOUS | Status: DC | PRN
  Administered 2018-03-19: 10 mL via INTRA_ARTERIAL

## 2018-03-19 MED ORDER — ONDANSETRON HCL 4 MG/2ML IJ SOLN: 4.0000 mg | Freq: Four times a day (QID) | INTRAMUSCULAR | Status: DC | PRN

## 2018-03-19 MED ORDER — SODIUM CHLORIDE 0.9 % WEIGHT BASED INFUSION: 1.0000 mL/kg/h | INTRAVENOUS | Status: DC

## 2018-03-19 MED ORDER — ACETAMINOPHEN 325 MG PO TABS: 650.0000 mg | ORAL_TABLET | ORAL | Status: DC | PRN

## 2018-03-19 MED ORDER — FENTANYL CITRATE (PF) 100 MCG/2ML IJ SOLN
INTRAMUSCULAR | Status: AC
  Filled 2018-03-19: qty 2

## 2018-03-19 MED ORDER — VERAPAMIL HCL 2.5 MG/ML IV SOLN
INTRAVENOUS | Status: AC
  Filled 2018-03-19: qty 2

## 2018-03-19 MED ORDER — FENTANYL CITRATE (PF) 100 MCG/2ML IJ SOLN
INTRAMUSCULAR | Status: DC | PRN
  Administered 2018-03-19 (×2): 25 ug via INTRAVENOUS

## 2018-03-19 MED ORDER — HEPARIN SODIUM (PORCINE) 1000 UNIT/ML IJ SOLN
INTRAMUSCULAR | Status: AC
  Filled 2018-03-19: qty 1

## 2018-03-19 MED ORDER — LIDOCAINE HCL (PF) 1 % IJ SOLN
INTRAMUSCULAR | Status: AC
  Filled 2018-03-19: qty 30

## 2018-03-19 MED ORDER — MIDAZOLAM HCL 2 MG/2ML IJ SOLN
INTRAMUSCULAR | Status: AC
  Filled 2018-03-19: qty 2

## 2018-03-19 SURGICAL SUPPLY — 9 items
CATH 5FR JL3.5 JR4 ANG PIG MP (CATHETERS) ×1 IMPLANT
DEVICE RAD COMP TR BAND LRG (VASCULAR PRODUCTS) ×1 IMPLANT
GLIDESHEATH SLEND A-KIT 6F 22G (SHEATH) ×2 IMPLANT
GUIDEWIRE INQWIRE 1.5J.035X260 (WIRE) IMPLANT
INQWIRE 1.5J .035X260CM (WIRE) ×2
KIT HEART LEFT (KITS) ×2 IMPLANT
PACK CARDIAC CATHETERIZATION (CUSTOM PROCEDURE TRAY) ×2 IMPLANT
TRANSDUCER W/STOPCOCK (MISCELLANEOUS) ×2 IMPLANT
TUBING CIL FLEX 10 FLL-RA (TUBING) ×2 IMPLANT

## 2018-03-19 NOTE — CV Procedure (Addendum)
   Left heart with coronary angiography via right radial approach using ultrasound guidance for access.  25% distal left main, calcified.  40% ostial circumflex.  Diffuse disease in the first diagonal and mid LAD up to 50% in each vessel.  Right coronary is large with luminal irregularities.  LVEDP is normal at 15 mmHg.  No angiographically significant obstructive disease is noted.  Exertional dyspnea of uncertain etiology.  No clear-cut cardiac explanation.  We will plan an exercise treadmill test to determine rhythm and blood pressure response to see if there are alternative explanations for dyspnea such as chronotropic incompetence or extreme blood pressure elevation.  Needs aggressive secondary risk factor modification.

## 2018-03-19 NOTE — Telephone Encounter (Signed)
Copied from [place] [id number]. Topic: Quick Communication - Rx Refill/Question >> Mar 19, 2018  3:38 PM Sheran Luz wrote: Medication: atorvastatin (LIPITOR) 20 MG tablet   Patient is requesting a refill of this medication.   Preferred Pharmacy (with phone number or street name):[place], [place] - [address]  [phone] (Phone) [phone] (Fax)

## 2018-03-19 NOTE — Interval H&P Note (Signed)
Cath Lab Visit (complete for each Cath Lab visit)  Clinical Evaluation Leading to the Procedure:   ACS: No.  Non-ACS:    Anginal Classification: CCS III  Anti-ischemic medical therapy: Minimal Therapy (1 class of medications)  Non-Invasive Test Results: No non-invasive testing performed  Prior CABG: No previous CABG      History and Physical Interval Note:  03/19/2018 10:54 AM  [name]  has presented today for surgery, with the diagnosis of angina  The various methods of treatment have been discussed with the patient and family. After consideration of risks, benefits and other options for treatment, the patient has consented to  Procedure(s): LEFT HEART CATH AND CORONARY ANGIOGRAPHY (N/A) as a surgical intervention .  The patient's history has been reviewed, patient examined, no change in status, stable for surgery.  I have reviewed the patient's chart and labs.  Questions were answered to the patient's satisfaction.     Belva Crome III

## 2018-03-19 NOTE — Discharge Instructions (Signed)
Drink plenty of fluids over next 48 hours and keep right wrist elevated for 24 hours  Radial Site Care  This sheet gives you information about how to care for yourself after your procedure. Your health care provider may also give you more specific instructions. If you have problems or questions, contact your health care provider. What can I expect after the procedure? After the procedure, it is common to have:  Bruising and tenderness at the catheter insertion area. Follow these instructions at home: Medicines  Take over-the-counter and prescription medicines only as told by your health care provider. Insertion site care  Follow instructions from your health care provider about how to take care of your insertion site. Make sure you: ? Wash your hands with soap and water before you change your bandage (dressing). If soap and water are not available, use hand sanitizer. ? Remove your dressing as told by your health care provider. In 24-48 hours  Check your insertion site every day for signs of infection. Check for: ? Redness, swelling, or pain. ? Fluid or blood. ? Pus or a bad smell. ? Warmth.  Do not take baths, swim, or use a hot tub until your health care provider approves.  You may shower 24-48 hours after the procedure, or as directed by your health care provider. ? Remove the dressing and gently wash the site with plain soap and water. ? Pat the area dry with a clean towel. ? Do not rub the site. That could cause bleeding.  Do not apply powder or lotion to the site. Activity   For 24 hours after the procedure, or as directed by your health care provider: ? Do not flex or bend the affected arm. ? Do not push or pull heavy objects with the affected arm. ? Do not drive yourself home from the hospital or clinic. You may drive 24 hours after the procedure unless your health care provider tells you not to. ? Do not operate machinery or power tools.  Do not lift anything that  is heavier than 10 lb (4.5 kg), or the limit that you are told, until your health care provider says that it is safe. For 5 days  Ask your health care provider when it is okay to: ? Return to work or school. ? Resume usual physical activities or sports. ? Resume sexual activity. General instructions  If the catheter site starts to bleed, raise your arm and put firm pressure on the site. If the bleeding does not stop, get help right away. This is a medical emergency.  If you went home on the same day as your procedure, a responsible adult should be with you for the first 24 hours after you arrive home.  Keep all follow-up visits as told by your health care provider. This is important. Contact a health care provider if:  You have a fever.  You have redness, swelling, or yellow drainage around your insertion site. Get help right away if:  You have unusual pain at the radial site.  The catheter insertion area swells very fast.  The insertion area is bleeding, and the bleeding does not stop when you hold steady pressure on the area.  Your arm or hand becomes pale, cool, tingly, or numb. These symptoms may represent a serious problem that is an emergency. Do not wait to see if the symptoms will go away. Get medical help right away. Call your local emergency services (911 in the [place] Do not drive yourself to  the hospital. Summary  After the procedure, it is common to have bruising and tenderness at the site.  Follow instructions from your health care provider about how to take care of your radial site wound. Check the wound every day for signs of infection.  Do not lift anything that is heavier than 10 lb (4.5 kg), or the limit that you are told, until your health care provider says that it is safe. This information is not intended to replace advice given to you by your health care provider. Make sure you discuss any questions you have with your health care provider. Document Released:  02/12/2010 Document Revised: 02/15/2017 Document Reviewed: 02/15/2017 Elsevier Interactive Patient Education  2019 [employer].

## 2018-03-20 ENCOUNTER — Telehealth: Payer: Self-pay | Admitting: *Deleted

## 2018-03-20 DIAGNOSIS — R0609 Other forms of dyspnea: Principal | ICD-10-CM

## 2018-03-20 NOTE — Telephone Encounter (Signed)
-----   Message from Belva Crome, MD sent at 03/19/2018 11:54 AM EST ----- Regarding: Dyspnea on exertion Rodney [name] has mild to moderate coronary disease. To further evaluate dyspnea on exertion, he needs a 2D Doppler echocardiogram and should also be set to have an exercise treadmill test (poet).  The stress test should be done with him on antihypertensive therapy as prescribed.

## 2018-03-20 NOTE — Telephone Encounter (Signed)
Spoke with pt and went over instructions for GXT.  Advised I will place orders and have schedulers contact him with appt.  Pt verbalized understanding and was in agreement with this plan.

## 2018-03-27 ENCOUNTER — Other Ambulatory Visit (HOSPITAL_COMMUNITY): Payer: Self-pay | Admitting: Urology

## 2018-03-27 ENCOUNTER — Other Ambulatory Visit: Payer: Self-pay | Admitting: Urology

## 2018-03-27 DIAGNOSIS — C61 Malignant neoplasm of prostate: Secondary | ICD-10-CM

## 2018-03-29 ENCOUNTER — Ambulatory Visit (INDEPENDENT_AMBULATORY_CARE_PROVIDER_SITE_OTHER): Payer: Medicare Other

## 2018-03-29 ENCOUNTER — Ambulatory Visit (HOSPITAL_COMMUNITY): Payer: Medicare Other

## 2018-03-29 ENCOUNTER — Telehealth: Payer: Self-pay | Admitting: *Deleted

## 2018-03-29 DIAGNOSIS — R0609 Other forms of dyspnea: Secondary | ICD-10-CM

## 2018-03-29 LAB — EXERCISE TOLERANCE TEST
Estimated workload: 7 METS
Exercise duration (min): 4 min
Exercise duration (sec): 20 s
MPHR: 143 {beats}/min
Peak HR: 136 {beats}/min
Percent HR: 95 %
RPE: 16
Rest HR: 78 {beats}/min

## 2018-03-29 MED ORDER — AMLODIPINE BESYLATE 10 MG PO TABS: 10.0000 mg | ORAL_TABLET | Freq: Every day | ORAL | 3 refills | Status: DC

## 2018-03-29 NOTE — Telephone Encounter (Signed)
Spoke with pt and went over results and recommendations per Dr. Tamala Julian.  Advised pt to monitor BP and let us know if it remains elevated or if it is dropping too low.  Discussed the parameters we were looking for.  Pt verbalized understanding and was in agreement with this plan.

## 2018-03-29 NOTE — Telephone Encounter (Signed)
-----   Message from Belva Crome, MD sent at 03/29/2018 12:51 PM EST ----- Let the patient know heart rate increased acceptably.  Blood pressure still higher than target.  Top number, systolic BP needs to be less than 130 mmHg.  Increase amlodipine to 10 mg/day. A copy will be sent to Isaac Bliss, Rayford Halsted, MD

## 2018-04-02 ENCOUNTER — Ambulatory Visit
Admission: RE | Admit: 2018-04-02 | Discharge: 2018-04-02 | Disposition: A | Discharge status: Home / Self Care | Payer: Medicare Other | Source: Ambulatory Visit | Attending: Nurse Practitioner | Admitting: Nurse Practitioner

## 2018-04-02 ENCOUNTER — Encounter: Payer: Self-pay | Admitting: Nurse Practitioner

## 2018-04-02 ENCOUNTER — Ambulatory Visit (HOSPITAL_COMMUNITY): Payer: Medicare Other | Attending: Cardiology

## 2018-04-02 ENCOUNTER — Ambulatory Visit: Payer: Medicare Other | Admitting: Nurse Practitioner

## 2018-04-02 VITALS — BP 112/68 | HR 75 | Ht 69.0 in | Wt 236.1 lb

## 2018-04-02 DIAGNOSIS — R0609 Other forms of dyspnea: Secondary | ICD-10-CM | POA: Diagnosis not present

## 2018-04-02 DIAGNOSIS — J9811 Atelectasis: Secondary | ICD-10-CM | POA: Diagnosis not present

## 2018-04-02 DIAGNOSIS — R0600 Dyspnea, unspecified: Secondary | ICD-10-CM

## 2018-04-02 LAB — ECHOCARDIOGRAM COMPLETE
Height: 69 in
Weight: 3777.92 oz

## 2018-04-02 MED ORDER — PERFLUTREN LIPID MICROSPHERE
1.0000 mL | INTRAVENOUS | Status: AC | PRN
  Administered 2018-04-02: 2 mL via INTRAVENOUS

## 2018-04-02 MED ORDER — ASPIRIN EC 81 MG PO TBEC: 81.0000 mg | DELAYED_RELEASE_TABLET | Freq: Every day | ORAL | Status: DC

## 2018-04-02 MED ORDER — AMLODIPINE BESYLATE 5 MG PO TABS: 5.0000 mg | ORAL_TABLET | Freq: Two times a day (BID) | ORAL | 6 refills | Status: DC

## 2018-04-02 NOTE — Progress Notes (Signed)
CARDIOLOGY OFFICE NOTE  Date:  04/02/2018    Rodney [name] Date of Birth: May 22, 1940 Medical Record #[id number]  PCP:  Isaac Bliss, Rayford Halsted, MD  Cardiologist:  Tamala Julian    Chief Complaint  Patient presents with  . Coronary Artery Disease    History of Present Illness: Rodney [name] is a 78 y.o. male who presents today for a post cath visit. Seen for Dr. Tamala Julian.   He has a history of DM type 2, HLD, HTN, and Reiter's syndrome. He is a former smoker, has obesity and possible OSA.   Referred here last month with chest pressure - concerning for ischemia and DOE. He was referred for cardiac cath - see below - to manage medically. GXT was arranged - hypertensive response noted and Norvasc was increased. He was to have an echo.    Comes in today. Here alone. His echo is for later today. He took only one dose of Norvasc at the 10 mg - had taken it and then went to the gym - felt dizzy - got home and BP was about 086 systolic and diastolic running in the 57'Q - he cut back to the 5 mg dose. BP is back up to 120 to 160 at home - typically over 140 per his report. Remains short of breath with exertion. He does not understand why - he has been going to the gym for the past 6 months and works out for an hour - really has no improvement. He does have recurrent prostate cancer - for bone scan next month and he says a CT (I do not see the CT appointment). He is no longer on aspirin - not really clear why - seems like he just stopped.   Past Medical History:  Diagnosis Date  . COLONIC POLYPS, HX OF 12/02/2008  . DIABETES MELLITUS, TYPE II 07/19/2007  . Erectile dysfunction   . GERD (gastroesophageal reflux disease)   . Glaucoma   . GOUT 07/19/2007  . HYPERLIPIDEMIA 12/02/2008  . HYPERTENSION 07/19/2007  . Kidney stones   . Prostate cancer ([place])   . Reiter syndrome    incomplete  . Vitamin D deficiency     Past Surgical History:  Procedure Laterality Date  . bladder neck  dilation    . CATARACT EXTRACTION     both eyes- 2015  . COLONOSCOPY    . ELBOW SURGERY     bone spur - right  . LEFT HEART CATH AND CORONARY ANGIOGRAPHY N/A 03/19/2018   Procedure: LEFT HEART CATH AND CORONARY ANGIOGRAPHY;  Surgeon: Belva Crome, MD;  Location: [facility];  Service: Cardiovascular;  Laterality: N/A;  . ORIF ANKLE FRACTURE Left 05/17/2015   Procedure: OPEN REDUCTION INTERNAL FIXATION (ORIF) ANKLE FRACTURE;  Surgeon: Meredith Pel, MD;  Location: [facility];  Service: Orthopedics;  Laterality: Left;  . PROSTATECTOMY    . VASECTOMY       Medications: Current Meds  Medication Sig  . acetaminophen (TYLENOL) 500 MG tablet Take 1,000 mg by mouth every 6 (six) hours as needed (for pain.).  Marland Kitchen amLODipine (NORVASC) 5 MG tablet Take 1 tablet (5 mg total) by mouth 2 (two) times daily.  Marland Kitchen atorvastatin (LIPITOR) 20 MG tablet Take 1 tablet (20 mg total) by mouth daily.  . dorzolamide-timolol (COSOPT) 22.3-6.8 MG/ML ophthalmic solution Place 1 drop into both eyes daily.   Marland Kitchen glipiZIDE (GLIPIZIDE XL) 2.5 MG 24 hr tablet Take 1 tablet (2.5 mg total) by  mouth daily with breakfast.  . glucose blood (ACCU-CHEK AVIVA) test strip 1 each by Other route 2 (two) times daily.  Marland Kitchen latanoprost (XALATAN) 0.005 % ophthalmic solution Place 1 drop into both eyes at bedtime.   Marland Kitchen losartan (COZAAR) 50 MG tablet TAKE 1 TABLET BY MOUTH ONCE DAILY (Patient taking differently: Take 50 mg by mouth at bedtime. )  . omeprazole (PRILOSEC) 20 MG capsule Take 1 capsule (20 mg total) by mouth daily.  . pioglitazone (ACTOS) 45 MG tablet TAKE 1 TABLET BY MOUTH ONCE DAILY (Patient taking differently: Take 45 mg by mouth daily. )  . Vitamin D, Ergocalciferol, (DRISDOL) 1.25 MG (50000 UT) CAPS capsule Take 1 capsule (50,000 Units total) by mouth every 7 (seven) days. (Patient taking differently: Take 50,000 Units by mouth every Monday. )  . [DISCONTINUED] amLODipine (NORVASC) 5 MG tablet Take 5 mg by mouth daily.    . [DISCONTINUED] aspirin EC 81 MG tablet Take 1 tablet (81 mg total) by mouth daily.     Allergies: Allergies  Allergen Reactions  . Ace Inhibitors     Tongue swelling. Reaction to benazepril only.     Social History: The patient  reports that he quit smoking about 40 years ago. His smoking use included cigarettes. He has never used smokeless tobacco. He reports current alcohol use. He reports that he does not use drugs.   Family History: The patient's family history includes Cancer in his sister.   Review of Systems: Please see the history of present illness.   Otherwise, the review of systems is positive for none.   All other systems are reviewed and negative.   Physical Exam: VS:  BP 112/68 (BP Location: Left Arm, Patient Position: Sitting, Cuff Size: Large)   Pulse 75   Ht 5\' 9"  (1.753 m)   Wt 236 lb 1.9 oz (107.1 kg)   SpO2 100% Comment: at rest  BMI 34.87 kg/m  .  BMI Body mass index is 34.87 kg/m.  Wt Readings from Last 3 Encounters:  04/02/18 236 lb 1.9 oz (107.1 kg)  03/19/18 230 lb (104.3 kg)  03/06/18 232 lb 9.6 oz (105.5 kg)   BP by me is 130/80  General: Pleasant. Well developed, well nourished and in no acute distress. He is obese.    HEENT: Normal.  Neck: Supple, no JVD, carotid bruits, or masses noted.  Cardiac: Regular rate and rhythm. No murmurs, rubs, or gallops. No edema.  Respiratory:  Lungs are clear to auscultation bilaterally with normal work of breathing.  GI: Soft and nontender.  MS: No deformity or atrophy. Gait and ROM intact.  Skin: Warm and dry. Color is normal.  Neuro:  Strength and sensation are intact and no gross focal deficits noted.  Psych: Alert, appropriate and with normal affect.   LABORATORY DATA:  EKG:  EKG is not ordered today.  Lab Results  Component Value Date   WBC 4.7 03/13/2018   HGB 11.6 (L) 03/13/2018   HCT 34.9 (L) 03/13/2018   PLT 280 03/13/2018   GLUCOSE 160 (H) 03/13/2018   CHOL 118 01/04/2018   TRIG  161.0 (H) 01/04/2018   HDL 25.90 (L) 01/04/2018   LDLDIRECT 166.1 09/19/2013   LDLCALC 59 01/04/2018   ALT 15 01/04/2018   AST 17 01/04/2018   NA 141 03/13/2018   K 4.6 03/13/2018   CL 105 03/13/2018   CREATININE 1.57 (H) 03/13/2018   BUN 22 03/13/2018   CO2 22 03/13/2018   TSH 4.12 01/04/2018  PSA 2.31 05/04/2016   HGBA1C 5.9 (A) 01/03/2018   MICROALBUR 3.2 (H) 01/04/2018     BNP (last 3 results) No results for input(s): BNP in the last 8760 hours.  ProBNP (last 3 results) No results for input(s): PROBNP in the last 8760 hours.   Other Studies Reviewed Today:  GXT Study Highlights 03/2018    Blood pressure demonstrated a normal response to exercise.  There was no ST segment deviation noted during stress.   ETT with moderately impaired exercise tolerance (4:20); no CP; normal BP response; no ST changes; negative adequate ETT; [facility] treadmill score 4.  Result Notes for Exercise Tolerance Test   Notes recorded by Belva Crome, MD on 03/29/2018 at 12:51 PM EST Let the patient know heart rate increased acceptably. Blood pressure still higher than target. Top number, systolic BP needs to be less than 130 mmHg. Increase amlodipine to 10 mg/day. A copy will be sent to Isaac Bliss, Rayford Halsted, MD      LEFT HEART CATH AND CORONARY ANGIOGRAPHY 02/2018  Conclusion    Left main, proximal LAD and proximal circumflex calcification noted on cine fluoroscopy.  25% distal left main.  LAD is widely patent in the ostium.  Beyond the first septal perforator and diagonal contains diffuse 40% narrowing.  The first diagonal contains diffuse mid vessel 50 to 60% narrowing.  Ostial circumflex contains eccentric calcified 30 to 40% narrowing.  Obtuse marginal branches are widely patent.  The third obtuse marginal is dominant of the 4 marginal branches.  Left ventriculography was of poor quality.  Anterior wall moves normally.  EF is estimated to be greater than 50%.  LVEDP was  normal at 15 mmHg.  RECOMMENDATIONS:   Dyspnea on exertion is of uncertain etiology.  He needs to have a 2D Doppler echocardiogram done to assess LV thickness and overall wall motion.  The study will also allow assessment of RV and pulmonary artery pressures.  Needs an exercise treadmill test to rule out chronotropic incompetence or severe hypertensive blood pressure response that could account for exertional fatigue and dyspnea.  Needs aggressive secondary risk factor modification to prevent progression of underlying disease as noted above.     Assessment/Plan:  1. Chest pressure and DOE - s/p cardiac cath - he is to have an echocardiogram later today - he is a former smoker - would benefit from CXR. Will send for CXR today. See what the echo shows.   2. HTN - higher at home - did not tolerate the 10 mg dose of Norvasc when taken all at once - will try split dosing - needs his cuff checked. Will have him monitor and keep a diary.   3. HLD - on statin therapy.   4. CKD  5. DM - per PCP  6. Recurrent prostate cancer.   7. CAD - non obstructive per recent cath - needs aggressive CV risk factor modification - would restart the aspirin. He is on statin. DM per PCP.   Current medicines are reviewed with the patient today.  The patient does not have concerns regarding medicines other than what has been noted above.  The following changes have been made:  See above.  Labs/ tests ordered today include:    Orders Placed This Encounter  Procedures  . DG Chest 2 View     Disposition:   Further disposition pending.   Patient is agreeable to this plan and will call if any problems develop in the interim.   Signed:  Truitt Merle, NP  04/02/2018 10:05 AM  [facility] [address] [place], [place]  [address] Phone: [phone] Fax: [phone]

## 2018-04-02 NOTE — Patient Instructions (Addendum)
We will be checking the following labs today - NONE  Please go to [facility] [place] on the first floor for a chest Xray - you may walk in.     Medication Instructions:    Continue with your current medicines. BUT  Let's try taking the Norvasc 5 mg twice a day - morning and night.   Resume your baby aspirin   If you need a refill on your cardiac medications before your next appointment, please call your pharmacy.     Testing/Procedures To Be Arranged:  N/A  Follow-Up:   See me in about 4 to 6 weeks - keep a record of your BP and bring to your visit. Bring your cuff to and we will check it for accuracy.     At [facility], you and your health needs are our priority.  As part of our continuing mission to provide you with exceptional heart care, we have created designated Provider Care Teams.  These Care Teams include your primary Cardiologist (physician) and Advanced Practice Providers (APPs -  Physician Assistants and Nurse Practitioners) who all work together to provide you with the care you need, when you need it.  Special Instructions:  . None  Call the [facility] office at [phone] if you have any questions, problems or concerns.

## 2018-04-03 ENCOUNTER — Other Ambulatory Visit: Payer: Self-pay | Admitting: *Deleted

## 2018-04-03 DIAGNOSIS — J9811 Atelectasis: Secondary | ICD-10-CM

## 2018-04-04 ENCOUNTER — Other Ambulatory Visit: Payer: Self-pay | Admitting: Internal Medicine

## 2018-04-04 ENCOUNTER — Ambulatory Visit (INDEPENDENT_AMBULATORY_CARE_PROVIDER_SITE_OTHER): Payer: Medicare Other | Admitting: Internal Medicine

## 2018-04-04 VITALS — BP 110/60 | HR 76 | Temp 97.9°F | Ht 69.0 in | Wt 233.5 lb

## 2018-04-04 DIAGNOSIS — E785 Hyperlipidemia, unspecified: Secondary | ICD-10-CM | POA: Diagnosis not present

## 2018-04-04 DIAGNOSIS — E1122 Type 2 diabetes mellitus with diabetic chronic kidney disease: Secondary | ICD-10-CM

## 2018-04-04 DIAGNOSIS — N183 Type 2 diabetes mellitus with diabetic chronic kidney disease: Secondary | ICD-10-CM

## 2018-04-04 DIAGNOSIS — C61 Malignant neoplasm of prostate: Secondary | ICD-10-CM

## 2018-04-04 DIAGNOSIS — E559 Vitamin D deficiency, unspecified: Secondary | ICD-10-CM

## 2018-04-04 DIAGNOSIS — R0602 Shortness of breath: Secondary | ICD-10-CM

## 2018-04-04 DIAGNOSIS — R0609 Other forms of dyspnea: Secondary | ICD-10-CM

## 2018-04-04 DIAGNOSIS — I10 Essential (primary) hypertension: Secondary | ICD-10-CM | POA: Diagnosis not present

## 2018-04-04 LAB — BASIC METABOLIC PANEL
BUN: 35 mg/dL — ABNORMAL HIGH (ref 6–23)
CO2: 26 mEq/L (ref 19–32)
Calcium: 9.3 mg/dL (ref 8.4–10.5)
Chloride: 104 mEq/L (ref 96–112)
Creatinine, Ser: 1.85 mg/dL — ABNORMAL HIGH (ref 0.40–1.50)
GFR: 35.55 mL/min — ABNORMAL LOW (ref >60.00)
Glucose, Bld: 161 mg/dL — ABNORMAL HIGH (ref 70–99)
POTASSIUM: 4.8 meq/L (ref 3.5–5.1)
SODIUM: 137 meq/L (ref 135–145)

## 2018-04-04 LAB — VITAMIN D 25 HYDROXY (VIT D DEFICIENCY, FRACTURES): VITD: 30.38 ng/mL (ref 30.00–100.00)

## 2018-04-04 LAB — POCT GLYCOSYLATED HEMOGLOBIN (HGB A1C): Hemoglobin A1C: 6.6 % — AB (ref 4.0–5.6)

## 2018-04-04 MED ORDER — VITAMIN D (ERGOCALCIFEROL) 1.25 MG (50000 UNIT) PO CAPS: 50000.0000 [IU] | ORAL_CAPSULE | ORAL | 0 refills | Status: DC

## 2018-04-04 NOTE — Patient Instructions (Addendum)
Great seeing you this morning.  Instructions: -Check your blood pressures at home and bring in to next visit -We will draw blood work today and notify you of the results

## 2018-04-04 NOTE — Progress Notes (Signed)
Established Patient Office Visit     CC/Reason for Visit: 3 month follow up chronic conditions  HPI: Rodney [name] is a 78 y.o. male who is coming in today for the above mentioned reasons. Past Medical History is significant for diabetes, hypertension, hyperlipidemia, ED, Gerd, gout, kidney stones, prostate ca, glaucoma, vit d def and reiter syndrome.  Patient saw cardiology yesterday for DOE and dizziness.  He also had a left heart cath on 03-19-18, stress test, chest xray and echo. All were relatively normal and do not explain his symptoms. He takes Norvasc 5mg  in the am and 5mg  at night.  Cardiology made a referral for him to see a lung doctor.  Patient says in April he will have a NM body scan of his entire body due to prostate cancer follow up.  He says his PSA is elevated, he sees oncology every 6 months.  Patient is c/o of dizzy spells that come and go but definitely worse with exertion; sometimes just going to the mailbox can cause SOB. This has been progressive over the past 6-8 months.  He goes to the gym 3x a week.  He says the SOB is bothering him the most and its been increasing within the last year.    Past Medical/Surgical History: Past Medical History:  Diagnosis Date  . COLONIC POLYPS, HX OF 12/02/2008  . DIABETES MELLITUS, TYPE II 07/19/2007  . Erectile dysfunction   . GERD (gastroesophageal reflux disease)   . Glaucoma   . GOUT 07/19/2007  . HYPERLIPIDEMIA 12/02/2008  . HYPERTENSION 07/19/2007  . Kidney stones   . Prostate cancer ([place])   . Reiter syndrome    incomplete  . Vitamin D deficiency     Past Surgical History:  Procedure Laterality Date  . bladder neck dilation    . CATARACT EXTRACTION     both eyes- 2015  . COLONOSCOPY    . ELBOW SURGERY     bone spur - right  . LEFT HEART CATH AND CORONARY ANGIOGRAPHY N/A 03/19/2018   Procedure: LEFT HEART CATH AND CORONARY ANGIOGRAPHY;  Surgeon: Belva Crome, MD;  Location: [facility];  Service:  Cardiovascular;  Laterality: N/A;  . ORIF ANKLE FRACTURE Left 05/17/2015   Procedure: OPEN REDUCTION INTERNAL FIXATION (ORIF) ANKLE FRACTURE;  Surgeon: Meredith Pel, MD;  Location: [facility];  Service: Orthopedics;  Laterality: Left;  . PROSTATECTOMY    . VASECTOMY      Social History:  reports that he quit smoking about 40 years ago. His smoking use included cigarettes. He has never used smokeless tobacco. He reports current alcohol use. He reports that he does not use drugs.  Allergies: Allergies  Allergen Reactions  . Ace Inhibitors     Tongue swelling. Reaction to benazepril only.     Family History:  Family History  Problem Relation Age of Onset  . Cancer Sister        metastatic colon ca  . Colon cancer Neg Hx   . Esophageal cancer Neg Hx   . Rectal cancer Neg Hx   . Stomach cancer Neg Hx      Current Outpatient Medications:  .  acetaminophen (TYLENOL) 500 MG tablet, Take 1,000 mg by mouth every 6 (six) hours as needed (for pain.)., Disp: , Rfl:  .  amLODipine (NORVASC) 5 MG tablet, Take 1 tablet (5 mg total) by mouth 2 (two) times daily., Disp: 60 tablet, Rfl: 6 .  aspirin EC 81 MG  tablet, Take 1 tablet (81 mg total) by mouth daily., Disp: , Rfl:  .  atorvastatin (LIPITOR) 20 MG tablet, Take 1 tablet (20 mg total) by mouth daily., Disp: 90 tablet, Rfl: 0 .  dorzolamide-timolol (COSOPT) 22.3-6.8 MG/ML ophthalmic solution, Place 1 drop into both eyes daily. , Disp: , Rfl:  .  glucose blood (ACCU-CHEK AVIVA) test strip, 1 each by Other route 2 (two) times daily., Disp: 100 each, Rfl: 3 .  latanoprost (XALATAN) 0.005 % ophthalmic solution, Place 1 drop into both eyes at bedtime. , Disp: , Rfl:  .  losartan (COZAAR) 50 MG tablet, TAKE 1 TABLET BY MOUTH ONCE DAILY (Patient taking differently: Take 50 mg by mouth at bedtime. ), Disp: 90 tablet, Rfl: 3 .  omeprazole (PRILOSEC) 20 MG capsule, Take 1 capsule (20 mg total) by mouth daily., Disp: 30 capsule, Rfl: 3 .  pioglitazone  (ACTOS) 45 MG tablet, TAKE 1 TABLET BY MOUTH ONCE DAILY (Patient taking differently: Take 45 mg by mouth daily. ), Disp: 90 tablet, Rfl: 1 .  Vitamin D, Ergocalciferol, (DRISDOL) 1.25 MG (50000 UT) CAPS capsule, Take 1 capsule (50,000 Units total) by mouth every 7 (seven) days. (Patient taking differently: Take 50,000 Units by mouth every Monday. ), Disp: 12 capsule, Rfl: 0  Review of Systems:  Constitutional: Denies fever, chills, diaphoresis, appetite change and fatigue.  HEENT: Denies photophobia, eye pain, redness, hearing loss, ear pain, congestion, sore throat, rhinorrhea, sneezing, mouth sores, trouble swallowing, neck pain, neck stiffness and tinnitus.   Respiratory: Denies  cough, chest tightness,  and wheezing.  C/o SOB and DOE and dizziness Cardiovascular: Denies chest pain, palpitations and leg swelling.  Gastrointestinal: Denies nausea, vomiting, abdominal pain, diarrhea, constipation, blood in stool and abdominal distention.  Genitourinary: Denies dysuria, urgency, frequency, hematuria, flank pain and difficulty urinating.  Endocrine: Denies: hot or cold intolerance, sweats, changes in hair or nails, polyuria, polydipsia. Musculoskeletal: Denies myalgias, back pain, joint swelling, arthralgias and gait problem.  Skin: Denies pallor, rash and wound.  Neurological: Denies seizures, syncope, weakness, light-headedness, numbness and headaches.  Hematological: Denies adenopathy. Easy bruising, personal or family bleeding history  Psychiatric/Behavioral: Denies suicidal ideation, mood changes, confusion, nervousness, sleep disturbance and agitation  Physical Exam: Vitals:   04/04/18 0800  BP: 110/60  Pulse: 76  Temp: 97.9 F (36.6 C)  TempSrc: Oral  SpO2: 96%  Weight: 233 lb 8 oz (105.9 kg)  Height: 5\' 9"  (1.753 m)    Body mass index is 34.48 kg/m.   Constitutional: NAD, calm, comfortable Eyes: PERRL, lids and conjunctivae normal Respiratory: clear to auscultation  bilaterally, no wheezing, no crackles. Normal respiratory effort. No accessory muscle use.  Cardiovascular: Regular rate and rhythm, no murmurs / rubs / gallops. No extremity edema. 2+ pedal pulses.  Abd; S/NT/ND/+BS Psychiatric: Normal judgment and insight. Alert and oriented x 3. Normal mood.    Impression and Plan:  Dyspnea on exertion -With DOE, tachycardia and dizziness with minimal exertion concerned about a possible lung pathology now that cardiac etiologies have been ruled out. -He has CKD III, which limits dye administration. Just received a dye load 3 weeks ago for cath. Baseline Cr is around 1.56-1.57. -Check renal function today, consider CT chest vs V/Q pending results. Feel like we need to r/o PE.(normal RV function on recent ECHO would go against massive PE), and also look at lung parenchyma (would prefer CT for this reason). -cards has already initiated pulmonary referral. -Past smoker but quit >40 yrs ago.  Type  2 diabetes mellitus with stage 3 chronic kidney disease, without long-term current use of insulin (HCC)  -A1c has crept up from 5.9 to 6.6 since we stopped metformin due to CKD. -If rises above 7 may need to consider alternative treatment. -Discussed lifestyle modifications today.  Essential hypertension -Well controlled. 110/60 today -Has had to split Norvasc dose as he would get hypotensive and dizzy when taking 10 mg at once.  Dyslipidemia -Last LDL 59 in 12/19. -On atorvastatin.  Chronic kidney disease (CKD), stage III (moderate) (HCC) -Baseline Cr around 1.56-1.57. -Recheck renal function today after dye load for cath and need for CT angio chest.    Patient Instructions  Great seeing you this morning.  Instructions: -Check your blood pressures at home and bring in to next visit -We will draw blood work today and notify you of the results      Enzo Bi, RN DNP [facility]

## 2018-04-06 ENCOUNTER — Other Ambulatory Visit: Payer: Self-pay | Admitting: Internal Medicine

## 2018-04-06 DIAGNOSIS — N183 Chronic kidney disease, stage 3 unspecified: Secondary | ICD-10-CM

## 2018-04-12 ENCOUNTER — Other Ambulatory Visit (INDEPENDENT_AMBULATORY_CARE_PROVIDER_SITE_OTHER): Payer: Medicare Other

## 2018-04-12 ENCOUNTER — Other Ambulatory Visit: Payer: Self-pay

## 2018-04-12 DIAGNOSIS — N183 Chronic kidney disease, stage 3 unspecified: Secondary | ICD-10-CM

## 2018-04-12 LAB — BASIC METABOLIC PANEL
BUN: 24 mg/dL — ABNORMAL HIGH (ref 6–23)
CO2: 26 mEq/L (ref 19–32)
Calcium: 9.3 mg/dL (ref 8.4–10.5)
Chloride: 103 mEq/L (ref 96–112)
Creatinine, Ser: 1.62 mg/dL — ABNORMAL HIGH (ref 0.40–1.50)
GFR: 41.43 mL/min — AB (ref >60.00)
Glucose, Bld: 227 mg/dL — ABNORMAL HIGH (ref 70–99)
Potassium: 4.4 mEq/L (ref 3.5–5.1)
Sodium: 137 mEq/L (ref 135–145)

## 2018-04-20 LAB — PSA: PSA: 9.03

## 2018-04-29 DIAGNOSIS — I10 Essential (primary) hypertension: Secondary | ICD-10-CM | POA: Diagnosis not present

## 2018-04-29 DIAGNOSIS — E119 Type 2 diabetes mellitus without complications: Secondary | ICD-10-CM | POA: Diagnosis not present

## 2018-04-29 DIAGNOSIS — N3289 Other specified disorders of bladder: Secondary | ICD-10-CM | POA: Diagnosis not present

## 2018-04-29 DIAGNOSIS — N210 Calculus in bladder: Secondary | ICD-10-CM | POA: Diagnosis not present

## 2018-04-29 DIAGNOSIS — R338 Other retention of urine: Secondary | ICD-10-CM | POA: Diagnosis not present

## 2018-04-29 DIAGNOSIS — Z923 Personal history of irradiation: Secondary | ICD-10-CM | POA: Diagnosis not present

## 2018-04-29 DIAGNOSIS — N320 Bladder-neck obstruction: Secondary | ICD-10-CM | POA: Diagnosis not present

## 2018-04-29 DIAGNOSIS — Z0000 Encounter for general adult medical examination without abnormal findings: Secondary | ICD-10-CM | POA: Diagnosis not present

## 2018-04-29 DIAGNOSIS — R339 Retention of urine, unspecified: Secondary | ICD-10-CM | POA: Diagnosis not present

## 2018-04-29 DIAGNOSIS — Z7984 Long term (current) use of oral hypoglycemic drugs: Secondary | ICD-10-CM | POA: Diagnosis not present

## 2018-04-29 DIAGNOSIS — Z888 Allergy status to other drugs, medicaments and biological substances status: Secondary | ICD-10-CM | POA: Diagnosis not present

## 2018-05-03 DIAGNOSIS — R338 Other retention of urine: Secondary | ICD-10-CM | POA: Diagnosis not present

## 2018-05-04 ENCOUNTER — Encounter (HOSPITAL_COMMUNITY): Payer: Self-pay

## 2018-05-04 ENCOUNTER — Other Ambulatory Visit: Payer: Self-pay

## 2018-05-04 ENCOUNTER — Emergency Department (HOSPITAL_COMMUNITY)
Admission: EM | Admit: 2018-05-04 | Discharge: 2018-05-04 | Disposition: A | Discharge status: Home / Self Care | Payer: Medicare Other | Attending: Emergency Medicine | Admitting: Emergency Medicine

## 2018-05-04 DIAGNOSIS — R338 Other retention of urine: Secondary | ICD-10-CM | POA: Insufficient documentation

## 2018-05-04 DIAGNOSIS — Z7984 Long term (current) use of oral hypoglycemic drugs: Secondary | ICD-10-CM | POA: Insufficient documentation

## 2018-05-04 DIAGNOSIS — Z79899 Other long term (current) drug therapy: Secondary | ICD-10-CM | POA: Insufficient documentation

## 2018-05-04 DIAGNOSIS — R339 Retention of urine, unspecified: Secondary | ICD-10-CM | POA: Diagnosis not present

## 2018-05-04 DIAGNOSIS — Z8546 Personal history of malignant neoplasm of prostate: Secondary | ICD-10-CM | POA: Diagnosis not present

## 2018-05-04 DIAGNOSIS — Z87891 Personal history of nicotine dependence: Secondary | ICD-10-CM | POA: Diagnosis not present

## 2018-05-04 DIAGNOSIS — I10 Essential (primary) hypertension: Secondary | ICD-10-CM | POA: Diagnosis not present

## 2018-05-04 DIAGNOSIS — Z7982 Long term (current) use of aspirin: Secondary | ICD-10-CM | POA: Diagnosis not present

## 2018-05-04 DIAGNOSIS — E119 Type 2 diabetes mellitus without complications: Secondary | ICD-10-CM | POA: Diagnosis not present

## 2018-05-04 LAB — URINALYSIS, ROUTINE W REFLEX MICROSCOPIC
Bilirubin Urine: NEGATIVE
Glucose, UA: NEGATIVE mg/dL
Ketones, ur: NEGATIVE mg/dL
Nitrite: NEGATIVE
Protein, ur: NEGATIVE mg/dL
Specific Gravity, Urine: 1.008 (ref 1.005–1.030)
pH: 7 (ref 5.0–8.0)

## 2018-05-04 MED ORDER — CEPHALEXIN 500 MG PO CAPS
500.0000 mg | ORAL_CAPSULE | Freq: Once | ORAL | Status: AC
  Administered 2018-05-04: 500 mg via ORAL
  Filled 2018-05-04: qty 1

## 2018-05-04 MED ORDER — CEPHALEXIN 500 MG PO CAPS: 500.0000 mg | ORAL_CAPSULE | Freq: Four times a day (QID) | ORAL | 0 refills | Status: DC

## 2018-05-04 NOTE — ED Triage Notes (Signed)
Patient coming from home with complaints of urinary retention starting around 8 PM last night. Patient states that he has only had dribbles of urine when attempting to urinate.  Patient had a bladder neck restriction and had surgery on 04/05 where a catheter was placed. Patient followed up with [facility] and had the catheter taken out yesterday around 0900. Had prostate removed in 07.

## 2018-05-04 NOTE — ED Provider Notes (Signed)
[facility] DEPT Provider Note: Rodney Spurling, MD, FACEP  CSN: [id number] MRN: [id number] ARRIVAL: 05/04/18 at [place]: [name]  Urinary Retention   HISTORY OF PRESENT ILLNESS  05/04/18 4:42 AM Rodney [name] is a 78 y.o. male with a history of prostatectomy for prostate cancer.  He was seen at [facility] in [place] on the fifth of this month for acute urinary retention with over 1000 mL of retained urine.  He underwent a cystoscopy with bladder neck incision to relieve the pressure.  He was discharged with indwelling 18 French Foley.  He had the Foley catheter removed yesterday at [facility].  He states he was doing well until about 8 PM yesterday evening after which she was no longer able to void his bladder.  He has only been able to pass a few drops.  He denies frank pain but states he is in some discomfort.  Bedside bladder scan showed about 235 mL of retained urine.  He has some skin breakdown of the right thigh where his prior Foley leg bag was held in place.   Past Medical History:  Diagnosis Date  . COLONIC POLYPS, HX OF 12/02/2008  . DIABETES MELLITUS, TYPE II 07/19/2007  . Erectile dysfunction   . GERD (gastroesophageal reflux disease)   . Glaucoma   . GOUT 07/19/2007  . HYPERLIPIDEMIA 12/02/2008  . HYPERTENSION 07/19/2007  . Kidney stones   . Prostate cancer ([place])   . Reiter syndrome    incomplete  . Vitamin D deficiency     Past Surgical History:  Procedure Laterality Date  . bladder neck dilation    . CATARACT EXTRACTION     both eyes- 2015  . COLONOSCOPY    . ELBOW SURGERY     bone spur - right  . LEFT HEART CATH AND CORONARY ANGIOGRAPHY N/A 03/19/2018   Procedure: LEFT HEART CATH AND CORONARY ANGIOGRAPHY;  Surgeon: Belva Crome, MD;  Location: [facility];  Service: Cardiovascular;  Laterality: N/A;  . ORIF ANKLE FRACTURE Left 05/17/2015   Procedure: OPEN REDUCTION INTERNAL FIXATION  (ORIF) ANKLE FRACTURE;  Surgeon: Meredith Pel, MD;  Location: [facility];  Service: Orthopedics;  Laterality: Left;  . PROSTATECTOMY    . VASECTOMY      Family History  Problem Relation Age of Onset  . Cancer Sister        metastatic colon ca  . Colon cancer Neg Hx   . Esophageal cancer Neg Hx   . Rectal cancer Neg Hx   . Stomach cancer Neg Hx     Social History   Tobacco Use  . Smoking status: Former Smoker    Types: Cigarettes    Last attempt to quit: 01/24/1978    Years since quitting: 40.3  . Smokeless tobacco: Never Used  Substance Use Topics  . Alcohol use: Yes    Comment: occasionally -socially  . Drug use: No    Prior to Admission medications   Medication Sig Start Date End Date Taking? Authorizing Provider  acetaminophen (TYLENOL) 500 MG tablet Take 1,000 mg by mouth every 6 (six) hours as needed (for pain.).    [provider]  amLODipine (NORVASC) 5 MG tablet Take 1 tablet (5 mg total) by mouth 2 (two) times daily. 04/02/18   Burtis Junes, NP  aspirin EC 81 MG tablet Take 1 tablet (81 mg total) by mouth daily. 04/02/18   Burtis Junes, NP  atorvastatin (LIPITOR) 20 MG tablet Take 1 tablet (20 mg total) by mouth daily. 03/19/18   Isaac Bliss, Rayford Halsted, MD  dorzolamide-timolol (COSOPT) 22.3-6.8 MG/ML ophthalmic solution Place 1 drop into both eyes daily.  09/20/13   [provider]  glipiZIDE (GLIPIZIDE XL) 2.5 MG 24 hr tablet Take 1 tablet (2.5 mg total) by mouth daily with breakfast. 11/16/15   Marletta Lor, MD  glucose blood (ACCU-CHEK AVIVA) test strip 1 each by Other route 2 (two) times daily. 06/24/11   Marletta Lor, MD  latanoprost (XALATAN) 0.005 % ophthalmic solution Place 1 drop into both eyes at bedtime.     [provider]  omeprazole (PRILOSEC) 20 MG capsule Take 1 capsule (20 mg total) by mouth daily. 01/19/18   Laurey Morale, MD  pioglitazone (ACTOS) 45 MG tablet TAKE 1 TABLET BY MOUTH ONCE DAILY Patient  taking differently: Take 45 mg by mouth daily.  11/24/17   Dorena Cookey, MD  Vitamin D, Ergocalciferol, (DRISDOL) 1.25 MG (50000 UT) CAPS capsule Take 1 capsule (50,000 Units total) by mouth every 7 (seven) days. 04/04/18   Isaac Bliss, Rayford Halsted, MD    Allergies Ace inhibitors   REVIEW OF SYSTEMS  Negative except as noted here or in the History of Present Illness.   PHYSICAL EXAMINATION  Initial Vital Signs Blood pressure (!) 135/93, pulse 96, temperature 97.6 F (36.4 C), temperature source Oral, resp. rate 18, height 5\' 8"  (1.727 m), weight 104.3 kg.  Examination General: Well-developed, well-nourished male in no acute distress; appearance consistent with age of record HENT: normocephalic; atraumatic Eyes: pupils equal, round and reactive to light; extraocular muscles intact Neck: supple Heart: regular rate and rhythm Lungs: clear to auscultation bilaterally Abdomen: soft; nondistended; nontender; bowel sounds present GU: Tanner V male, circumcised Extremities: No deformity; full range of motion; pulses normal; edema of lower legs Neurologic: Awake, alert and oriented; motor function intact in all extremities and symmetric; no facial droop Skin: Warm and dry; skin breakdown of right thigh in pattern of leg bag straps Psychiatric: Normal mood and affect   RESULTS  Summary of this visit's results, reviewed by myself:   EKG Interpretation  Date/Time:    Ventricular Rate:    PR Interval:    QRS Duration:   QT Interval:    QTC Calculation:   R Axis:     Text Interpretation:        Laboratory Studies: Results for orders placed or performed during the hospital encounter of 05/04/18 (from the past 24 hour(s))  Urinalysis, Routine w reflex microscopic     Status: Abnormal   Collection Time: 05/04/18  4:45 AM  Result Value Ref Range   Color, Urine YELLOW YELLOW   APPearance CLEAR CLEAR   Specific Gravity, Urine 1.008 1.005 - 1.030   pH 7.0 5.0 - 8.0   Glucose,  UA NEGATIVE NEGATIVE mg/dL   Hgb urine dipstick MODERATE (A) NEGATIVE   Bilirubin Urine NEGATIVE NEGATIVE   Ketones, ur NEGATIVE NEGATIVE mg/dL   Protein, ur NEGATIVE NEGATIVE mg/dL   Nitrite NEGATIVE NEGATIVE   Leukocytes,Ua MODERATE (A) NEGATIVE   RBC / HPF 0-5 0 - 5 RBC/hpf   WBC, UA 21-50 0 - 5 WBC/hpf   Bacteria, UA RARE (A) NONE SEEN   WBC Clumps PRESENT    Amorphous Crystal PRESENT    Imaging Studies: No results found.  ED COURSE and MDM  Nursing notes and initial vitals signs, including pulse oximetry, reviewed.  Vitals:  05/04/18 0425 05/04/18 0428  BP:  (!) 135/93  Pulse:  96  Resp:  18  Temp:  97.6 F (36.4 C)  TempSrc:  Oral  Weight: 104.3 kg   Height: 5\' 8"  (1.727 m)    4:45 AM 18 French Foley catheter placed by nursing staff with relief of obstruction.   5:18 AM Urine sent for culture.  Will place on antibiotics for urinalysis suspicious for urinary tract infection.  PROCEDURES    ED DIAGNOSES     ICD-10-CM   1. Acute urinary retention R33.8        Shanon Rosser, MD 05/04/18 [phone]

## 2018-05-04 NOTE — ED Notes (Signed)
Pt resting in bed. Tolerated insertion of foley catheter well. Urine collected and sent to lab. No acute distress at this time

## 2018-05-04 NOTE — ED Notes (Signed)
Pt given and verbalized understanding of d.c instructions and need for follow up with pcp and urology. Told to return if s/s worsen. No further distress or questions upon ambulating out of department.

## 2018-05-04 NOTE — ED Notes (Signed)
Bladder scanned-- 235 mLs present

## 2018-05-04 NOTE — ED Notes (Signed)
Leg bag applied to foley catheter for discharge. Tolerated well.

## 2018-05-06 LAB — URINE CULTURE
Culture: >=100000 — AB
Special Requests: NORMAL

## 2018-05-07 ENCOUNTER — Encounter (HOSPITAL_COMMUNITY): Payer: Medicare Other

## 2018-05-07 ENCOUNTER — Telehealth: Payer: Self-pay | Admitting: Emergency Medicine

## 2018-05-07 ENCOUNTER — Encounter (HOSPITAL_COMMUNITY): Payer: Self-pay

## 2018-05-07 NOTE — Telephone Encounter (Signed)
Post ED Visit - Positive Culture Follow-up: Successful Patient Follow-Up  Culture assessed and recommendations reviewed by:  []  Elenor Quinones, Pharm.D. []  Heide Guile, Pharm.D., BCPS AQ-ID []  Parks Neptune, Pharm.D., BCPS []  Alycia Rossetti, Pharm.D., BCPS []  [place], Pharm.D., BCPS, AAHIVP []  Legrand Como, Pharm.D., BCPS, AAHIVP []  Salome Arnt, PharmD, BCPS []  Johnnette Gourd, PharmD, BCPS []  Hughes Better, PharmD, BCPS []  Leeroy Cha, PharmD Gust Brooms PharmD  Positive urine culture  []  Patient discharged without antimicrobial prescription and treatment is now indicated [x]  Organism is resistant to prescribed ED discharge antimicrobial []  Patient with positive blood cultures  Changes discussed with ED provider: Darlin Drop PA New antibiotic prescription discontinue cephalexin, Start Bactrim DS 1 tab po bid  Informed patient that it has too long since culture was done >30 days and to f/u with PCP if needed and still symptomatic   Hazle Nordmann 05/07/2018, 3:12 PM

## 2018-05-07 NOTE — Telephone Encounter (Signed)
Post ED Visit - Positive Culture Follow-up: Successful Patient Follow-Up  Culture assessed and recommendations reviewed by:  []  Elenor Quinones, Pharm.D. []  Heide Guile, Pharm.D., BCPS AQ-ID []  Parks Neptune, Pharm.D., BCPS []  Alycia Rossetti, Pharm.D., BCPS []  [place], Pharm.D., BCPS, AAHIVP []  Legrand Como, Pharm.D., BCPS, AAHIVP []  Salome Arnt, PharmD, BCPS []  Johnnette Gourd, PharmD, BCPS []  Hughes Better, PharmD, BCPS   Positive urine culture  []  Patient discharged without antimicrobial prescription and treatment is now indicated []  Organism is resistant to prescribed ED discharge antimicrobial []  Patient with positive blood cultures  Changes discussed with ED provider: Janeece Fitting [facility]  New antibiotic prescription decrease cephalexin 500mg  to twice daily to complete 7 days course so 4 days of twice daily dosage  Patient voiced understanding of decreased dosage  Hazle Nordmann 05/07/2018, 3:22 PM

## 2018-05-07 NOTE — Progress Notes (Signed)
ED Antimicrobial Stewardship Positive Culture Follow Up   Rodney [name] is an 78 y.o. male who presented to [facility] on 05/04/2018 with a chief complaint of  Chief Complaint  Patient presents with  . Urinary Retention    Recent Results (from the past 720 hour(s))  Urine culture     Status: Abnormal   Collection Time: 05/04/18  4:52 AM  Result Value Ref Range Status   Specimen Description   Final    URINE, CATHETERIZED Performed at [facility] [address] [place], [place] [address]    Special Requests   Final    Normal Performed at [facility], [place] [address], [place], [place] [address]    Culture >=100,000 COLONIES/mL PROTEUS MIRABILIS (A)  Final   Report Status 05/06/2018 FINAL  Final   Organism ID, Bacteria PROTEUS MIRABILIS (A)  Final      Susceptibility   Proteus mirabilis - MIC*    AMPICILLIN <=2 SENSITIVE Sensitive     CEFAZOLIN <=4 SENSITIVE Sensitive     CEFTRIAXONE <=1 SENSITIVE Sensitive     CIPROFLOXACIN <=0.25 SENSITIVE Sensitive     GENTAMICIN <=1 SENSITIVE Sensitive     IMIPENEM 2 SENSITIVE Sensitive     NITROFURANTOIN 128 RESISTANT Resistant     TRIMETH/SULFA <=20 SENSITIVE Sensitive     AMPICILLIN/SULBACTAM <=2 SENSITIVE Sensitive     PIP/TAZO <=4 SENSITIVE Sensitive     * >=100,000 COLONIES/mL PROTEUS MIRABILIS    Patient prescribed Cephalexin at discharge, which is appropriate antimicrobial based on sensitivities for above organism. However, based on last available SCr (1.65 on 04/29/2018-see Care Everywhere), decrease Cephalexin dose to 500mg  PO TWICE DAILY to complete 7 day course.    ED Provider: Janeece Fitting, PA-C   Lindell Spar M 05/07/2018, 2:17 PM Clinical Pharmacist [phone]

## 2018-05-08 ENCOUNTER — Other Ambulatory Visit: Payer: Self-pay

## 2018-05-08 ENCOUNTER — Ambulatory Visit (HOSPITAL_COMMUNITY)
Admission: RE | Admit: 2018-05-08 | Discharge: 2018-05-08 | Disposition: A | Discharge status: Home / Self Care | Payer: Medicare Other | Source: Ambulatory Visit | Attending: Urology | Admitting: Urology

## 2018-05-08 ENCOUNTER — Encounter (HOSPITAL_COMMUNITY)
Admission: RE | Admit: 2018-05-08 | Discharge: 2018-05-08 | Disposition: A | Discharge status: Home / Self Care | Payer: Medicare Other | Source: Ambulatory Visit | Attending: Urology | Admitting: Urology

## 2018-05-08 DIAGNOSIS — C61 Malignant neoplasm of prostate: Secondary | ICD-10-CM | POA: Diagnosis not present

## 2018-05-08 MED ORDER — TECHNETIUM TC 99M MEDRONATE IV KIT
20.0000 | PACK | Freq: Once | INTRAVENOUS | Status: AC | PRN
  Administered 2018-05-08: 12:00:00 20 via INTRAVENOUS

## 2018-05-09 DIAGNOSIS — R8279 Other abnormal findings on microbiological examination of urine: Secondary | ICD-10-CM | POA: Diagnosis not present

## 2018-05-09 DIAGNOSIS — R338 Other retention of urine: Secondary | ICD-10-CM | POA: Diagnosis not present

## 2018-05-11 ENCOUNTER — Encounter: Payer: Self-pay | Admitting: Internal Medicine

## 2018-05-14 ENCOUNTER — Telehealth: Payer: Self-pay | Admitting: *Deleted

## 2018-05-14 NOTE — Telephone Encounter (Signed)
S/w pt does not find it necessary to have a VT call. Pt stated went for CXR,  found a couple of things with CXR and since went to PCP stated it is not pt's heart.  Pt will cancel appt. Stated there is a recall in system for September and  pt was fine with this. Will cancel pt's appt and send to [facility] to [place].

## 2018-05-14 NOTE — Telephone Encounter (Signed)
I referred him to pulmonary - looks like this was never scheduled.  Can we check on this? Has order in system for VQ scan by his PCP.  [place] for recall in September - sooner if needed.

## 2018-05-15 NOTE — Telephone Encounter (Signed)
PCP is taking care of pulmonology issues per pt just waiting to get in.

## 2018-05-21 ENCOUNTER — Telehealth: Payer: Medicare Other | Admitting: Nurse Practitioner

## 2018-05-21 DIAGNOSIS — R338 Other retention of urine: Secondary | ICD-10-CM | POA: Diagnosis not present

## 2018-05-21 DIAGNOSIS — R8279 Other abnormal findings on microbiological examination of urine: Secondary | ICD-10-CM | POA: Diagnosis not present

## 2018-05-22 DIAGNOSIS — R338 Other retention of urine: Secondary | ICD-10-CM | POA: Diagnosis not present

## 2018-05-22 DIAGNOSIS — R310 Gross hematuria: Secondary | ICD-10-CM | POA: Diagnosis not present

## 2018-05-23 ENCOUNTER — Other Ambulatory Visit: Payer: Self-pay

## 2018-05-23 ENCOUNTER — Encounter: Payer: Self-pay | Admitting: Internal Medicine

## 2018-05-23 ENCOUNTER — Ambulatory Visit: Payer: Medicare Other | Admitting: Internal Medicine

## 2018-05-23 VITALS — BP 116/64 | HR 88 | Temp 97.7°F | Ht 68.0 in | Wt 232.0 lb

## 2018-05-23 DIAGNOSIS — R0609 Other forms of dyspnea: Secondary | ICD-10-CM

## 2018-05-23 LAB — CBC WITH DIFFERENTIAL/PLATELET
Basophils Absolute: 0 10*3/uL (ref 0.0–0.1)
Basophils Relative: 0.8 % (ref 0.0–3.0)
Eosinophils Absolute: 0.1 10*3/uL (ref 0.0–0.7)
Eosinophils Relative: 1.6 % (ref 0.0–5.0)
HCT: 36.4 % — ABNORMAL LOW (ref 39.0–52.0)
Hemoglobin: 12.3 g/dL — ABNORMAL LOW (ref 13.0–17.0)
Lymphocytes Relative: 15.3 % (ref 12.0–46.0)
Lymphs Abs: 0.8 10*3/uL (ref 0.7–4.0)
MCHC: 33.8 g/dL (ref 30.0–36.0)
MCV: 98.8 fl (ref 78.0–100.0)
Monocytes Absolute: 0.5 10*3/uL (ref 0.1–1.0)
Monocytes Relative: 8.9 % (ref 3.0–12.0)
Neutro Abs: 3.9 10*3/uL (ref 1.4–7.7)
Neutrophils Relative %: 73.4 % (ref 43.0–77.0)
Platelets: 267 10*3/uL (ref 150.0–400.0)
RBC: 3.69 Mil/uL — ABNORMAL LOW (ref 4.22–5.81)
RDW: 14.6 % (ref 11.5–15.5)
WBC: 5.3 10*3/uL (ref 4.0–10.5)

## 2018-05-23 LAB — BASIC METABOLIC PANEL
BUN: 21 mg/dL (ref 6–23)
CO2: 25 mEq/L (ref 19–32)
Calcium: 9.4 mg/dL (ref 8.4–10.5)
Chloride: 102 mEq/L (ref 96–112)
Creatinine, Ser: 1.66 mg/dL — ABNORMAL HIGH (ref 0.40–1.50)
GFR: 40.27 mL/min — ABNORMAL LOW (ref >60.00)
Glucose, Bld: 158 mg/dL — ABNORMAL HIGH (ref 70–99)
Potassium: 4.3 mEq/L (ref 3.5–5.1)
Sodium: 137 mEq/L (ref 135–145)

## 2018-05-23 LAB — TSH: TSH: 5.45 u[IU]/mL — ABNORMAL HIGH (ref 0.35–4.50)

## 2018-05-23 LAB — D-DIMER, QUANTITATIVE: D-Dimer, Quant: 0.44 mcg/mL FEU (ref <0.50)

## 2018-05-23 NOTE — Progress Notes (Signed)
Rodney [name], male    DOB: 08/08/1940   MRN: [id number]   Brief patient profile:  56 yowm quit smoking 1980  At wt 150 baseline s obvious sequelae with indolent onset progressive doe x spring 2019 s assoc cough/ cp so underwent cards w/u with  03/19/18  [facility]  With no critical CAD and lvedp 15 and ETT  03/29/18 with moderately impaired ex tol and echo 04/02/18 ok RV, some diastolic dysfunction and referred to pulmonary clinic 05/23/2018 by Truitt Merle NP      History of Present Illness  05/23/2018  Pulmonary/ 1st office eval/Caryl Manas  Chief Complaint  Patient presents with   Pulmonary Consult    Referred by Truitt Merle, NP.  Pt c/o DOE "with doing anything physically" x 1 yr  Dyspnea:  MMRC1 = can walk nl pace, flat grade, can't hurry or go uphills or steps s sob   Could do recumbant bike before the gym closed for covid19  tol up to 30 min but only 3 min on treadmill x flat surface ? pace Cough: no Sleep: on side / one pillow SABA use: none  No regular walking since coronovirus   No obvious day to day or daytime variability or assoc excess/ purulent sputum or mucus plugs or hemoptysis or cp or chest tightness, subjective wheeze or overt sinus or hb symptoms.   Sleeping as above  without nocturnal  or early am exacerbation  of respiratory  c/o's or need for noct saba. Also denies any obvious fluctuation of symptoms with weather or environmental changes or other aggravating or alleviating factors except as outlined above   No unusual exposure hx or h/o childhood pna/ asthma or knowledge of premature birth.  Current Allergies, Complete Past Medical History, Past Surgical History, Family History, and Social History were reviewed in [employer] record.  ROS  The following are not active complaints unless bolded Hoarseness, sore throat, dysphagia, dental problems, itching, sneezing,  nasal congestion or discharge of excess mucus or purulent secretions, ear ache,    fever, chills, sweats, unintended wt loss or wt gain, classically pleuritic or exertional cp,  orthopnea pnd or arm/hand swelling  or leg swelling, presyncope, palpitations, abdominal pain, anorexia, nausea, vomiting, diarrhea  or change in bowel habits or change in bladder habits/ wearing foley now due to bladder outlet obst, change in stools or change in urine, dysuria, hematuria,  rash, arthralgias, visual complaints, headache, numbness, weakness or ataxia or problems with walking or coordination,  change in mood or  memory.           Past Medical History:  Diagnosis Date   COLONIC POLYPS, HX OF 12/02/2008   DIABETES MELLITUS, TYPE II 07/19/2007   Erectile dysfunction    GERD (gastroesophageal reflux disease)    Glaucoma    GOUT 07/19/2007   HYPERLIPIDEMIA 12/02/2008   HYPERTENSION 07/19/2007   Kidney stones    Prostate cancer ([place])    Reiter syndrome    incomplete   Vitamin D deficiency       Outpatient Medications Prior to Visit  Medication Sig Dispense Refill   acetaminophen (TYLENOL) 500 MG tablet Take 1,000 mg by mouth every 6 (six) hours as needed (for pain.).     amLODipine (NORVASC) 5 MG tablet Take 1 tablet (5 mg total) by mouth 2 (two) times daily. 60 tablet 6   aspirin EC 81 MG tablet Take 1 tablet (81 mg total) by mouth daily.     atorvastatin (LIPITOR)  20 MG tablet Take 1 tablet (20 mg total) by mouth daily. 90 tablet 0   dorzolamide-timolol (COSOPT) 22.3-6.8 MG/ML ophthalmic solution Place 1 drop into both eyes daily.      glipiZIDE (GLIPIZIDE XL) 2.5 MG 24 hr tablet Take 1 tablet (2.5 mg total) by mouth daily with breakfast. 90 tablet 1   glucose blood (ACCU-CHEK AVIVA) test strip 1 each by Other route 2 (two) times daily. 100 each 3   latanoprost (XALATAN) 0.005 % ophthalmic solution Place 1 drop into both eyes at bedtime.      Vitamin D, Ergocalciferol, (DRISDOL) 1.25 MG (50000 UT) CAPS capsule Take 1 capsule (50,000 Units total) by mouth every 7  (seven) days. 12 capsule 0   cephALEXin (KEFLEX) 500 MG capsule Take 1 capsule (500 mg total) by mouth 4 (four) times daily. 28 capsule 0   omeprazole (PRILOSEC) 20 MG capsule Take 1 capsule (20 mg total) by mouth daily. 30 capsule 3   pioglitazone (ACTOS) 45 MG tablet  not clear whether taking or not           Objective:     BP 116/64 (BP Location: Left Arm, Cuff Size: Normal)    Pulse 88    Temp 97.7 F (36.5 C) (Oral)    Ht 5\' 8"  (1.727 m)    Wt 232 lb (105.2 kg)    SpO2 99%    BMI 35.28 kg/m   SpO2: 99 % RA   Obese wm nad    HEENT: nl dentition, turbinates bilaterally, and oropharynx. Nl external ear canals without cough reflex   NECK :  without JVD/Nodes/TM/ nl carotid upstrokes bilaterally   LUNGS: no acc muscle use,  Nl contour chest which is clear to A and P bilaterally without cough on insp or exp maneuvers   CV:  RRR  no s3 or murmur or increase in P2, and no edema   ABD:  Obese/ soft and nontender with nl inspiratory excursion in the supine position. No bruits or organomegaly appreciated, bowel sounds nl  MS:  Nl gait/ ext warm without deformities, calf tenderness, cyanosis or clubbing No obvious joint restrictions   SKIN: warm and dry without lesions    NEURO:  alert, approp, nl sensorium with  no motor or cerebellar deficits apparent.       I personally reviewed images and agree with radiology impression as follows:  CXR:   04/02/2018 Heart is at the upper limits of normal in size. Normal mediastinal contours. Atherosclerotic calcification of the aortic arch. Normal pulmonary vascularity. Mild bibasilar atelectasis. No focal consolidation, pleural effusion, or pneumothorax. No acute osseous Abnormality.   Labs ordered/ reviewed:      Chemistry      Component Value Date/Time   NA 137 05/23/2018 1239   NA 141 03/13/2018 0844   K 4.3 05/23/2018 1239   CL 102 05/23/2018 1239   CO2 25 05/23/2018 1239   BUN 21 05/23/2018 1239   BUN 22 03/13/2018  0844   CREATININE 1.66 (H) 05/23/2018 1239      Component Value Date/Time   CALCIUM 9.4 05/23/2018 1239   ALKPHOS 70 01/04/2018 0810   AST 17 01/04/2018 0810   ALT 15 01/04/2018 0810   BILITOT 0.9 01/04/2018 0810         Lab Results  Component Value Date   WBC 5.3 05/23/2018   HGB 12.3 (L) 05/23/2018   HCT 36.4 (L) 05/23/2018   MCV 98.8 05/23/2018   PLT 267.0 05/23/2018  EOS                                                               0.1                                    05/23/2018   Lab Results  Component Value Date   DDIMER 0.44 05/23/2018      Lab Results  Component Value Date   TSH 5.45 (H) 05/23/2018         Lab Results  Component Value Date   ESRSEDRATE 40 (H) 08/06/2012            Assessment   Dyspnea on exertion Quit smoking 1980 at wt around 150 lbs ? Onset sping 2019  /24/20  LHC  With no critical CAD and lvedp 15  ETT  03/29/18 with moderately impaired ex tol  Echo 04/02/18  ok RV, some diastolic dysfunction - 09/11/2991  @wt   =332    Walked RA  2 laps @  approx 295ft each @ fast pace  stopped due to end of study min Sob with sats 98%     Symptoms are markedly disproportionate to objective findings and not clear to what extent this is actually a pulmonary  problem but pt does appear to have difficult to sort out respiratory symptoms of unknown origin for which  DDX  = almost all start with A and  include Adherence, Ace Inhibitors, Acid Reflux, Active Sinus Disease, Alpha 1 Antitripsin deficiency, Anxiety masquerading as Airways dz,  ABPA,  Allergy(esp in young), Aspiration (esp in elderly), Adverse effects of meds,  Active smoking or Vaping, A bunch of PE's/clot burden (a few small clots can't cause this syndrome unless there is already severe underlying pulm or vascular dz with poor reserve),  Anemia or thyroid disorder, plus two Bs  = Bronchiectasis and Beta blocker use..and one C= CHF     Adherence is always the initial "prime suspect" and is a  multilayered concern that requires a "trust but verify" approach in every patient - starting with knowing how to use medications, especially inhalers, correctly, keeping up with refills and understanding the fundamental difference between maintenance and prns vs those medications only taken for a very short course and then stopped and not refilled.  - seems easily confused with meds, reviewed how to do med reconciliation and call us back right away if the list we gave him today doesn't match up with his meds 100% - return with all meds in hand using a trust but verify approach to confirm accurate Medication  Reconciliation The principal here is that until we are certain that the  patients are doing what we've asked, it makes no sense to ask them to do more.   ? Adverse drug effects >  I used to see sev patients a year on actos with unexplained doe and they usually also had diastolic dysfunction as here but always better off it so rec trial ff and if bs not well controlled > regroup with PCP  ? Allergy/ asthma/copd > needs pfts next ov to close the loop when  COVID - 19 restrictions have been lifted.   ? Anxiety/obesity /deconditioning  >  usually at the bottom of this list of usual suspects but may interfere with   adherence and also interpretation of response or lack thereof to symptom management which can be quite subjective.  - see avs for recs re reconditioning   ? Anemia/ thyroid dz > boderline hgb and tsh will need continued f/u by pcp/ advised   ? A bunch of pe's >  D dimer nl - while a normal  or high normal value (seen commonly in the elderly or chronically ill)  may miss small peripheral pe, the clot burden with sob is moderately high and the d dimer  has a very high neg pred value if used in this setting.    ? Bblockers > on timolol eyedrops but no evidence of airflow obst   ? 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     Lab Results  Component Value Date   TSH 5.45 (H) 05/23/2018     Contributing to gerd risk/ doe/reviewed the need and the process to achieve and maintain neg calorie balance > defer f/u primary care including intermittently monitoring thyroid status     Total time devoted to counseling  > 50 % of initial 60 min office visit:  review case with pt/personally observing portions of amb 02 study/  discussion of options/alternatives/ personally creating written customized instructions  in presence of pt  then going over those specific  Instructions directly with the pt including how to use all of the meds but in particular covering each new medication in detail and the difference between the maintenance= "automatic" meds and the prns using an action plan format for the latter (If this problem/symptom => do that organization reading Left to right).  Please see AVS from this visit for a full list of these instructions which I personally wrote for this pt and  are unique to this visit.        Christinia Gully, MD 05/23/2018

## 2018-05-23 NOTE — Assessment & Plan Note (Addendum)
Body mass index is 35.28 kg/m.    Lab Results  Component Value Date   TSH 5.45 (H) 05/23/2018     Contributing to gerd risk/ doe/reviewed the need and the process to achieve and maintain neg calorie balance > defer f/u primary care including intermittently monitoring thyroid status   Total time devoted to counseling  > 50 % of initial 60 min office visit:  review case with pt/personally observing portions of amb 02 study/ discussion of options/alternatives/ personally creating written customized instructions  in presence of pt  then going over those specific  Instructions directly with the pt including how to use all of the meds but in particular covering each new medication in detail and the difference between the maintenance= "automatic" meds and the prns using an action plan format for the latter (If this problem/symptom => do that organization reading Left to right).  Please see AVS from this visit for a full list of these instructions which I personally wrote for this pt and  are unique to this visit.

## 2018-05-23 NOTE — Assessment & Plan Note (Addendum)
Quit smoking 1980 at wt around 150 lbs ? Onset sping 2019  /24/20  [facility]  With no critical CAD and lvedp 15  ETT  03/29/18 with moderately impaired ex tol  Echo 04/02/18  ok RV, some diastolic dysfunction - 5/70/1779  @wt   =332    Walked RA  2 laps @  approx 274ft each @ fast pace  stopped due to end of study min Sob with sats 98%     Symptoms are markedly disproportionate to objective findings and not clear to what extent this is actually a pulmonary  problem but pt does appear to have difficult to sort out respiratory symptoms of unknown origin for which  DDX  = almost all start with A and  include Adherence, Ace Inhibitors, Acid Reflux, Active Sinus Disease, Alpha 1 Antitripsin deficiency, Anxiety masquerading as Airways dz,  ABPA,  Allergy(esp in young), Aspiration (esp in elderly), Adverse effects of meds,  Active smoking or Vaping, A bunch of PE's/clot burden (a few small clots can't cause this syndrome unless there is already severe underlying pulm or vascular dz with poor reserve),  Anemia or thyroid disorder, plus two Bs  = Bronchiectasis and Beta blocker use..and one C= CHF     Adherence is always the initial "prime suspect" and is a multilayered concern that requires a "trust but verify" approach in every patient - starting with knowing how to use medications, especially inhalers, correctly, keeping up with refills and understanding the fundamental difference between maintenance and prns vs those medications only taken for a very short course and then stopped and not refilled.  - seems easily confused with meds, reviewed how to do med reconciliation and call us back right away if the list we gave him today doesn't match up with his meds 100% - return with all meds in hand using a trust but verify approach to confirm accurate Medication  Reconciliation The principal here is that until we are certain that the  patients are doing what we've asked, it makes no sense to ask them to do more.   ?  Adverse drug effects >  I used to see sev patients a year on actos with unexplained doe and they usually also had diastolic dysfunction as here but always better off it so rec trial ff and if bs not well controlled > regroup with PCP  ? Allergy/ asthma/copd > needs pfts next ov to close the loop  ? Anxiety/obesity /deconditioning  > usually at the bottom of this list of usual suspects but may interfere with   adherence and also interpretation of response or lack thereof to symptom management which can be quite subjective.  - see avs for recs re reconditioning   ? Anemia/ thyroid dz > boderline hgb and tsh will need continued f/u by pcp/ advised   ? A bunch of pe's >  D dimer nl - while a normal  or high normal value (seen commonly in the elderly or chronically ill)  may miss small peripheral pe, the clot burden with sob is moderately high and the d dimer  has a very high neg pred value if used in this setting.    ? Bblockers > on timolol eyedrops but no evidence of airflow obst   ? chf /diastolic dysfunction > needs trial off actos as noted above/ rx bp is better at present

## 2018-05-23 NOTE — Patient Instructions (Addendum)
Weight control is simply a matter of calorie balance which needs to be tilted in your favor by eating less and exercising more.  To get the most out of exercise, you need to be continuously aware that you are short of breath, but never out of breath, for 30 minutes daily. As you improve, it will actually be easier for you to do the same amount of exercise  in  30 minutes so always push to the level where you are short of breath.      Think of your calorie balance like you do your bank account where in this case you want the balance to go down so you must take in less calories than you burn up.  It's just that simple:  Hard to do, but easy to understand.  Good luck!    Be sure the list you have today corresponds 100% to the medications you are taking and that you have stopped the actos (pioglitazone) and if you haven't then stop it and let your PCP know if sugars are getting to high and see her for adjustments of your other meds.  Please schedule a follow up office visit in 6 weeks, call sooner if needed with all medications /inhalers/ solutions in hand so we can verify exactly what you are taking. This includes all medications from all doctors and over the counters  - PFTs on return

## 2018-05-24 ENCOUNTER — Telehealth: Payer: Self-pay | Admitting: Internal Medicine

## 2018-05-24 NOTE — Telephone Encounter (Signed)
Called patient in regard to lab results. And forwarded results to pcp. Look at result notes.  Nothing further needed

## 2018-05-28 ENCOUNTER — Other Ambulatory Visit: Payer: Self-pay

## 2018-05-28 NOTE — Patient Outreach (Signed)
[facility]) Care Management  05/28/2018  [place] 1940-11-07 [id number]   Medication Adherence call to Mr. Lillia Mountain spoke with patient he is due on Losartan 50 mg and Pioglitazone 45 mg patient explain he keeps up with all of his medication and if there is a need on a medication he calls his doctor and the pharmacy,patient has plenty at this time and does not need anything.Mr. Bolds is showing past due under [place].   [facility] Direct Dial [phone]  Fax [phone] [email or web address]

## 2018-05-29 DIAGNOSIS — R310 Gross hematuria: Secondary | ICD-10-CM | POA: Diagnosis not present

## 2018-05-29 DIAGNOSIS — R338 Other retention of urine: Secondary | ICD-10-CM | POA: Diagnosis not present

## 2018-05-31 ENCOUNTER — Other Ambulatory Visit: Payer: Self-pay | Admitting: Urology

## 2018-06-01 ENCOUNTER — Ambulatory Visit (INDEPENDENT_AMBULATORY_CARE_PROVIDER_SITE_OTHER): Payer: Medicare Other | Admitting: Internal Medicine

## 2018-06-01 ENCOUNTER — Telehealth: Payer: Self-pay | Admitting: *Deleted

## 2018-06-01 ENCOUNTER — Other Ambulatory Visit: Payer: Self-pay

## 2018-06-01 DIAGNOSIS — R7989 Other specified abnormal findings of blood chemistry: Secondary | ICD-10-CM | POA: Diagnosis not present

## 2018-06-01 DIAGNOSIS — N183 Chronic kidney disease, stage 3 unspecified: Secondary | ICD-10-CM

## 2018-06-01 DIAGNOSIS — E038 Other specified hypothyroidism: Secondary | ICD-10-CM | POA: Insufficient documentation

## 2018-06-01 DIAGNOSIS — E1122 Type 2 diabetes mellitus with diabetic chronic kidney disease: Secondary | ICD-10-CM

## 2018-06-01 DIAGNOSIS — E039 Hypothyroidism, unspecified: Secondary | ICD-10-CM | POA: Insufficient documentation

## 2018-06-01 LAB — T3, FREE: T3, Free: 2.9 pg/mL (ref 2.3–4.2)

## 2018-06-01 MED ORDER — GLIPIZIDE 5 MG PO TABS: 5.0000 mg | ORAL_TABLET | Freq: Every day | ORAL | 1 refills | Status: DC

## 2018-06-01 NOTE — Telephone Encounter (Signed)
Copied from [place]. Topic: General - Other >> Jun 01, 2018 11:38 AM Rainey Pines A wrote: Reason for CRM: Patients wife Yurem Viner called and stated that her husband is concerned that he has been taken off his blood sugar medication. He has pre-admission testing on 06-08-2018 and he is concerned because he has no way of testing his blood sugar before then to ensure he can move forward with his scheduled surgery. Patients wife is requesting a callback from Dr. Jerilee Hoh nurse. Best contact number : [phone]

## 2018-06-01 NOTE — Addendum Note (Signed)
Addended by: Elmer Picker on: 06/01/2018 02:49 PM   Modules accepted: Orders

## 2018-06-01 NOTE — Telephone Encounter (Signed)
Telephone visit scheduled for 06/01/2018

## 2018-06-01 NOTE — Progress Notes (Signed)
Virtual Visit via Telephone Note  I connected with Rodney [name] on 06/01/18 at  2:00 PM EDT by telephone and verified that I am speaking with the correct person using two identifiers.   I discussed the limitations, risks, security and privacy concerns of performing an evaluation and management service by telephone and the availability of in person appointments. I also discussed with the patient that there may be a patient responsible charge related to this service. The patient expressed understanding and agreed to proceed.  We initially attempted to connect via video chat but were unable to due to technical difficulties on the patient's end, so we converted this visit to a phone visit.   Location patient: home Location provider: work office Participants present for the call: patient, provider Patient did not have a visit in the prior 7 days to address this/these issue(s).   History of Present Illness:  He has scheduled this visit to discuss his DM. He has been having SOB this year. Has seen cardiology and pulmonology. Dr. Melvyn Novas believes Pioglitazone might be playing a role and it has been discontinued. He is concerned that now he is not on any DM meds; has no way of checking his CBGs. Last A1c was 6.6 in May. He is metformin due to CKD with a baseline Cr of around 1.6.  He had a TSH with Dr. Melvyn Novas which resulted at 5.45.   Observations/Objective: Patient sounds cheerful and well on the phone. I do not appreciate any increased work of breathing. Speech and thought processing are grossly intact. Patient reported vitals: None reported   Current Outpatient Medications:  .  acetaminophen (TYLENOL) 500 MG tablet, Take 1,000 mg by mouth every 6 (six) hours as needed (for pain.)., Disp: , Rfl:  .  amLODipine (NORVASC) 5 MG tablet, Take 1 tablet (5 mg total) by mouth 2 (two) times daily., Disp: 60 tablet, Rfl: 6 .  aspirin EC 81 MG tablet, Take 1 tablet (81 mg total) by mouth daily.,  Disp: , Rfl:  .  atorvastatin (LIPITOR) 20 MG tablet, Take 1 tablet (20 mg total) by mouth daily., Disp: 90 tablet, Rfl: 0 .  dorzolamide-timolol (COSOPT) 22.3-6.8 MG/ML ophthalmic solution, Place 1 drop into both eyes daily. , Disp: , Rfl:  .  glipiZIDE (GLUCOTROL) 5 MG tablet, Take 1 tablet (5 mg total) by mouth daily before breakfast., Disp: 90 tablet, Rfl: 1 .  glucose blood (ACCU-CHEK AVIVA) test strip, 1 each by Other route 2 (two) times daily., Disp: 100 each, Rfl: 3 .  latanoprost (XALATAN) 0.005 % ophthalmic solution, Place 1 drop into both eyes at bedtime. , Disp: , Rfl:  .  Vitamin D, Ergocalciferol, (DRISDOL) 1.25 MG (50000 UT) CAPS capsule, Take 1 capsule (50,000 Units total) by mouth every 7 (seven) days., Disp: 12 capsule, Rfl: 0  Review of Systems:  Constitutional: Denies fever, chills, diaphoresis, appetite change and fatigue.  HEENT: Denies photophobia, eye pain, redness, hearing loss, ear pain, congestion, sore throat, rhinorrhea, sneezing, mouth sores, trouble swallowing, neck pain, neck stiffness and tinnitus.   Respiratory: Denies SOB, DOE, cough, chest tightness,  and wheezing.   Cardiovascular: Denies chest pain, palpitations and leg swelling.  Gastrointestinal: Denies nausea, vomiting, abdominal pain, diarrhea, constipation, blood in stool and abdominal distention.  Genitourinary: Denies dysuria, urgency, frequency, hematuria, flank pain and difficulty urinating.  Endocrine: Denies: hot or cold intolerance, sweats, changes in hair or nails, polyuria, polydipsia. Musculoskeletal: Denies myalgias, back pain, joint swelling, arthralgias and gait problem.  Skin: Denies pallor, rash and wound.  Neurological: Denies dizziness, seizures, syncope, weakness, light-headedness, numbness and headaches.  Hematological: Denies adenopathy. Easy bruising, personal or family bleeding history  Psychiatric/Behavioral: Denies suicidal ideation, mood changes, confusion, nervousness, sleep  disturbance and agitation   Assessment and Plan:  Type 2 diabetes mellitus with stage 3 chronic kidney disease, without long-term current use of insulin (HCC)  -Currently not on any meds. -Start Glipizide 5 mg daily. -Check A1c in 3 months. -Will provide with free CBG meter which he will pick up today.  Abnormal TSH  -?Hypothyroidism. -Check free T3 and T4.    I discussed the assessment and treatment plan with the patient. The patient was provided an opportunity to ask questions and all were answered. The patient agreed with the plan and demonstrated an understanding of the instructions.   The patient was advised to call back or seek an in-person evaluation if the symptoms worsen or if the condition fails to improve as anticipated.  I provided 16 minutes of non-face-to-face time during this encounter.   Lelon Frohlich, MD [facility]

## 2018-06-04 LAB — T4, FREE: Free T4: 0.91 ng/dL (ref 0.60–1.60)

## 2018-06-05 ENCOUNTER — Encounter: Payer: Self-pay | Admitting: Internal Medicine

## 2018-06-07 ENCOUNTER — Other Ambulatory Visit: Payer: Self-pay | Admitting: Internal Medicine

## 2018-06-07 DIAGNOSIS — R7989 Other specified abnormal findings of blood chemistry: Secondary | ICD-10-CM

## 2018-06-07 NOTE — Progress Notes (Signed)
lov cards 04-02-2018 lov pulm 05-23-2018 ekg 02-27-2018 Echo 04-02-2018 Stress test 03-29-2018 cxr 04-02-2018

## 2018-06-07 NOTE — Patient Instructions (Addendum)
Jahmir Salo [facility]  06/07/2018   YOU ARE REQUIRED TO BE TESTED FOR COVID-19 PRIOR TO YOUR SURGERY . YOUR TEST MUST BE COMPLETED ON Friday Jun 08, 2018. TESTING IS LOCATED AT [place] ENTRANCE FROM 9:00AM - 3:00PM. FAILURE TO COMPLETE TESTING MAY RESULT IN CANCELLATION OF YOUR SURGERY.   _________________________________________________________________________   Your procedure is scheduled on: 06-11-2018   Report to [facility] Main  Entrance     Report to [facility] at 5:30AM      Call this number if you have problems the morning of surgery [phone]    Remember: Do not eat food or drink liquids :After Midnight. BRUSH YOUR TEETH MORNING OF SURGERY AND RINSE YOUR MOUTH OUT, NO CHEWING GUM CANDY OR MINTS.     Take these medicines the morning of surgery with A SIP OF WATER: AMLODIPINE, ATORVASTATIN, EYE DROPS    DO NOT TAKE ANY DIABETES MEDICATION THE DAY OF SURGERY! PLEASE CHECK YOUR BLOOD SUGAR THE DAY OF SURGERY. REPORT TO YOUR NURSE ON ARRIVAL.  IF YOUR BLOOD SUGAR IS LESS THAN 70 THE MORNING SURGERY WHEN YOU CHECK IT AT HOME, YOU NEED TO CALL THE [phone] FOR FURTHER INSTRUCTIONS!                                You may not have any metal on your body including hair pins and              piercings  Do not wear jewelry, make-up, lotions, powders or perfumes, deodorant                        Men may shave face and neck.   Do not bring valuables to the hospital. [place].  Contacts, dentures or bridgework may not be worn into surgery.      Patients discharged the day of surgery will not be allowed to drive home. IF YOU ARE HAVING SURGERY AND GOING HOME THE SAME DAY, YOU MUST HAVE AN ADULT TO DRIVE YOU HOME AND BE WITH YOU FOR 24 HOURS. YOU MAY GO HOME BY TAXI OR UBER OR ORTHERWISE, BUT AN ADULT MUST ACCOMPANY YOU HOME AND STAY WITH YOU FOR 24 HOURS.  Name  and phone number of your driver:  Special Instructions: N/A              Please read over the following fact sheets you were given: _____________________________________________________________________             [facility] - Preparing for Surgery Before surgery, you can play an important role.  Because skin is not sterile, your skin needs to be as free of germs as possible.  You can reduce the number of germs on your skin by washing with CHG (chlorahexidine gluconate) soap before surgery.  CHG is an antiseptic cleaner which kills germs and bonds with the skin to continue killing germs even after washing. Please DO NOT use if you have an allergy to CHG or antibacterial soaps.  If your skin becomes reddened/irritated stop using the CHG and inform your nurse when you arrive at Short Stay. Do not shave (including legs and underarms) for at least 48 hours prior  to the first CHG shower.  You may shave your face/neck. Please follow these instructions carefully:  1.  Shower with CHG Soap the night before surgery and the  morning of Surgery.  2.  If you choose to wash your hair, wash your hair first as usual with your  normal  shampoo.  3.  After you shampoo, rinse your hair and body thoroughly to remove the  shampoo.                           4.  Use CHG as you would any other liquid soap.  You can apply chg directly  to the skin and wash                       Gently with a scrungie or clean washcloth.  5.  Apply the CHG Soap to your body ONLY FROM THE NECK DOWN.   Do not use on face/ open                           Wound or open sores. Avoid contact with eyes, ears mouth and genitals (private parts).                       Wash face,  Genitals (private parts) with your normal soap.             6.  Wash thoroughly, paying special attention to the area where your surgery  will be performed.  7.  Thoroughly rinse your body with warm water from the neck down.  8.  DO NOT shower/wash with your normal  soap after using and rinsing off  the CHG Soap.                9.  Pat yourself dry with a clean towel.            10.  Wear clean pajamas.            11.  Place clean sheets on your bed the night of your first shower and do not  sleep with pets. Day of Surgery : Do not apply any lotions/deodorants the morning of surgery.  Please wear clean clothes to the hospital/surgery center.  FAILURE TO FOLLOW THESE INSTRUCTIONS MAY RESULT IN THE CANCELLATION OF YOUR SURGERY PATIENT SIGNATURE_________________________________  NURSE SIGNATURE__________________________________  ________________________________________________________________________

## 2018-06-07 NOTE — Progress Notes (Signed)
SPOKE W/  _  PATIENT   DENIES THE FOLLOWING BELOW      SCREENING SYMPTOMS OF COVID 19:   COUGH--   RUNNY NOSE---   SORE THROAT---  NASAL CONGESTION----  SNEEZING----  SHORTNESS OF BREATH---  DIFFICULTY BREATHING---  TEMP >100.0 -----  UNEXPLAINED BODY ACHES------  CHILLS --------   HEADACHES ---------  LOSS OF SMELL/ TASTE --------    HAVE YOU OR ANY FAMILY MEMBER TRAVELLED PAST 14 DAYS OUT OF THE   COUNTY--- STATE---- COUNTRY----  HAVE YOU OR ANY FAMILY MEMBER BEEN EXPOSED TO ANYONE WITH COVID 19?

## 2018-06-08 ENCOUNTER — Other Ambulatory Visit: Payer: Self-pay

## 2018-06-08 ENCOUNTER — Other Ambulatory Visit: Payer: Self-pay | Admitting: Urology

## 2018-06-08 ENCOUNTER — Encounter (HOSPITAL_COMMUNITY): Payer: Self-pay

## 2018-06-08 ENCOUNTER — Other Ambulatory Visit (HOSPITAL_COMMUNITY)
Admission: RE | Admit: 2018-06-08 | Discharge: 2018-06-08 | Disposition: A | Discharge status: Home / Self Care | Payer: Medicare Other | Source: Ambulatory Visit | Attending: Urology | Admitting: Urology

## 2018-06-08 ENCOUNTER — Encounter (HOSPITAL_COMMUNITY)
Admission: RE | Admit: 2018-06-08 | Discharge: 2018-06-08 | Disposition: A | Discharge status: Home / Self Care | Payer: Medicare Other | Source: Ambulatory Visit | Attending: Urology | Admitting: Urology

## 2018-06-08 DIAGNOSIS — Z87891 Personal history of nicotine dependence: Secondary | ICD-10-CM | POA: Insufficient documentation

## 2018-06-08 DIAGNOSIS — I129 Hypertensive chronic kidney disease with stage 1 through stage 4 chronic kidney disease, or unspecified chronic kidney disease: Secondary | ICD-10-CM | POA: Insufficient documentation

## 2018-06-08 DIAGNOSIS — Z7982 Long term (current) use of aspirin: Secondary | ICD-10-CM | POA: Insufficient documentation

## 2018-06-08 DIAGNOSIS — Z1159 Encounter for screening for other viral diseases: Secondary | ICD-10-CM | POA: Insufficient documentation

## 2018-06-08 DIAGNOSIS — C61 Malignant neoplasm of prostate: Secondary | ICD-10-CM | POA: Diagnosis not present

## 2018-06-08 DIAGNOSIS — E559 Vitamin D deficiency, unspecified: Secondary | ICD-10-CM | POA: Insufficient documentation

## 2018-06-08 DIAGNOSIS — Z79899 Other long term (current) drug therapy: Secondary | ICD-10-CM | POA: Insufficient documentation

## 2018-06-08 DIAGNOSIS — N210 Calculus in bladder: Secondary | ICD-10-CM | POA: Insufficient documentation

## 2018-06-08 DIAGNOSIS — H409 Unspecified glaucoma: Secondary | ICD-10-CM | POA: Insufficient documentation

## 2018-06-08 DIAGNOSIS — E785 Hyperlipidemia, unspecified: Secondary | ICD-10-CM | POA: Insufficient documentation

## 2018-06-08 DIAGNOSIS — I351 Nonrheumatic aortic (valve) insufficiency: Secondary | ICD-10-CM | POA: Insufficient documentation

## 2018-06-08 DIAGNOSIS — E1122 Type 2 diabetes mellitus with diabetic chronic kidney disease: Secondary | ICD-10-CM | POA: Insufficient documentation

## 2018-06-08 DIAGNOSIS — Z01812 Encounter for preprocedural laboratory examination: Secondary | ICD-10-CM | POA: Insufficient documentation

## 2018-06-08 DIAGNOSIS — N183 Chronic kidney disease, stage 3 (moderate): Secondary | ICD-10-CM | POA: Insufficient documentation

## 2018-06-08 DIAGNOSIS — I2510 Atherosclerotic heart disease of native coronary artery without angina pectoris: Secondary | ICD-10-CM | POA: Insufficient documentation

## 2018-06-08 DIAGNOSIS — R339 Retention of urine, unspecified: Secondary | ICD-10-CM | POA: Diagnosis not present

## 2018-06-08 DIAGNOSIS — Z923 Personal history of irradiation: Secondary | ICD-10-CM | POA: Diagnosis not present

## 2018-06-08 DIAGNOSIS — Z8546 Personal history of malignant neoplasm of prostate: Secondary | ICD-10-CM | POA: Insufficient documentation

## 2018-06-08 DIAGNOSIS — Z7984 Long term (current) use of oral hypoglycemic drugs: Secondary | ICD-10-CM | POA: Diagnosis not present

## 2018-06-08 DIAGNOSIS — I10 Essential (primary) hypertension: Secondary | ICD-10-CM | POA: Diagnosis not present

## 2018-06-08 DIAGNOSIS — Z6834 Body mass index (BMI) 34.0-34.9, adult: Secondary | ICD-10-CM | POA: Diagnosis not present

## 2018-06-08 HISTORY — DX: Atherosclerotic heart disease of native coronary artery without angina pectoris: I25.10

## 2018-06-08 HISTORY — DX: Chronic kidney disease, stage 3 unspecified: N18.30

## 2018-06-08 HISTORY — DX: Calculus in bladder: N21.0

## 2018-06-08 HISTORY — DX: Dyspnea, unspecified: R06.00

## 2018-06-08 LAB — CBC
HCT: 36.5 % — ABNORMAL LOW (ref 39.0–52.0)
Hemoglobin: 12 g/dL — ABNORMAL LOW (ref 13.0–17.0)
MCH: 34.1 pg — ABNORMAL HIGH (ref 26.0–34.0)
MCHC: 32.9 g/dL (ref 30.0–36.0)
MCV: 103.7 fL — ABNORMAL HIGH (ref 80.0–100.0)
Platelets: 254 10*3/uL (ref 150–400)
RBC: 3.52 MIL/uL — ABNORMAL LOW (ref 4.22–5.81)
RDW: 14.1 % (ref 11.5–15.5)
WBC: 6.9 10*3/uL (ref 4.0–10.5)
nRBC: 0 % (ref 0.0–0.2)

## 2018-06-08 LAB — HEMOGLOBIN A1C
Hgb A1c MFr Bld: 6.5 % — ABNORMAL HIGH (ref 4.8–5.6)
Mean Plasma Glucose: 139.85 mg/dL

## 2018-06-08 LAB — BASIC METABOLIC PANEL
Anion gap: 10 (ref 5–15)
BUN: 19 mg/dL (ref 8–23)
CO2: 22 mmol/L (ref 22–32)
Calcium: 9.3 mg/dL (ref 8.9–10.3)
Chloride: 107 mmol/L (ref 98–111)
Creatinine, Ser: 1.58 mg/dL — ABNORMAL HIGH (ref 0.61–1.24)
GFR calc Af Amer: 48 mL/min — ABNORMAL LOW (ref >60)
GFR calc non Af Amer: 41 mL/min — ABNORMAL LOW (ref >60)
Glucose, Bld: 91 mg/dL (ref 70–99)
Potassium: 4.4 mmol/L (ref 3.5–5.1)
Sodium: 139 mmol/L (ref 135–145)

## 2018-06-08 LAB — SARS CORONAVIRUS 2 BY RT PCR (HOSPITAL ORDER, PERFORMED IN ~~LOC~~ HOSPITAL LAB): SARS Coronavirus 2: NEGATIVE

## 2018-06-08 LAB — HM DIABETES EYE EXAM

## 2018-06-08 LAB — GLUCOSE, CAPILLARY: Glucose-Capillary: 88 mg/dL (ref 70–99)

## 2018-06-08 NOTE — Progress Notes (Signed)
Patient pre-op appt is today 06-08-2018 and surgery is Monday 06-11-2018 . RN consulted with Jeannene Patella , RN on Covid testing team as to whether patient can complete novel covid test today . Per Pam, due to time frame (less than 72 hours between pre-op and day of surgery), patient would need order for rapid SARS and test  to be completed today. RN notified Coni Mabe (scheduler for Dr [name]) via VMM of this adjustment. Order for SARS test placed in epic. Patient will be directed to testing site to complete SARS test today after pre-op appt.

## 2018-06-08 NOTE — H&P (Signed)
Office Visit Report     05/29/2018   --------------------------------------------------------------------------------   Rodney [name]  MRN: [id number]  PRIMARY CARE:  Bluford Kaufmann (retired), MD  DOB: [birth year], 78 year old Male  REFERRING:  Luvenia Redden  SSN: [id number] [id number]  PROVIDER:  [name], M.D.    TREATING:  Azucena Fallen    LOCATION:  [facility] [phone]   --------------------------------------------------------------------------------   CC/HPI: 1. Biochemically recurrent prostate cancer  2. Urinary retention  3. Bladder calculus   05/09/18: Rodney [name] follows up today for further evaluation. Since his last visit with me, he has undergone a bone scan and CT scan for staging of his biochemically recurrent prostate cancer and rising PSA. However, in the meantime, he had been out of town visiting his 54 year old mother when he developed acute urinary retention along with gross hematuria. He presented to a hospital in [place] and was seen by urologist after multiple failed attempts to place a catheter in the emergency department. He underwent an operative procedure that apparently, per his report, required urethral dilation and catheter placement. He underwent a voiding trial last week and subsequently developed retention again requiring a visit to the emergency department here where a catheter was replaced. He follows up today for further evaluation after undergoing both a bone scan and CT scan.   05/21/18: Patient with above noted history. He presents today with concerns regarding possible cystitis. At prior OV, cystoscopic findings did not demonstrate any bladder tumors or other concerning findings for malignancy. The cause of both his retention and his hematuria is likely a stone noted on cystoscopy at his bladder neck. Dr. Alinda Money did recommended that he proceed with outpatient surgery with cystoscopy and removal of the stone and any potential  underlying foreign body. He is scheduled for follow up for catheter exchange/culture on 5/13. Procedure will likely be scheduled for sometime in June given current concerns surrounding COVID-19. Today he presents with complaints of malodorous urine and some intermittent white colored discharge in his urine. He states that this has been ongoing for the past week. He states that he continues to tolerate the catheter and it has been draining well. He continues to have some intermittent hematuria, but denies clots. He will have painful urgency at times, but this is manageable. He denies fevers, chills, nausea, or vomiting.   05/22/18: Patient with above noted history. He returns today the clinic today with complaints of increased hematuria and poor output from his catheter. He states that this began last night. He states that yesterday after leaving clinic, urine output became bloody. He has noted a few small clots. He has been having leakage from around the catheter and very little drainage into his catheter bag. He denies suprapubic pain. No fevers or chills. Urine culture from yesterday is still pending.   05/29/18: Patient with above noted history. Most recent urine culture was positive for Proteus mirabilis. He was started on Keflex and will complete therapy today. He presents today with complaints of poor output from his catheter beginning this morning. He last emptying his drainage bag around 5:30 am and noted approximatly 300 cc in the bag. However, he has not had any output in the catheter at that time. He denies leakage from around the catheter. He currently complains of suprapubic pain and pressure. He states that he noted increased hematuria beginning yesterday with string like clots. He denies fevers or chills. He states his catheter has been draining well since exchange  last week up until this point. He does use 81 mg Aspirin for preventative purposes.     ALLERGIES: Benazepril    MEDICATIONS:  Keflex 500 mg capsule 1 capsule PO BID  Amlodipine Besylate 5 mg tablet  Atorvastatin Calcium  Latanoprost 0.005 % drops Ophthalmic  Losartan Potassium 50 mg tablet  Onglyza 2.5 mg tablet Oral  Timoptic 0.25 % drops Ophthalmic     GU PSH: Cystoscopy - 05/09/2018 Locm 300-399Mg /Ml Iodine,1Ml - 05/07/2018, 05/29/2017 Robotic Radical Prostatectomy - 2009      PSH Notes: Prostatect Retropubic Radical W/ Nerve Sparing Laparoscopic   Right foot repair  Cardiac Catheterization without stent placement   NON-GU PSH: None   GU PMH: Gross hematuria - 05/22/2018 Urinary Retention - 05/03/2018 Prostate Cancer, Prostate cancer - 2017 Stress Incontinence, Male stress incontinence - 2017 History of urolithiasis, Nephrolithiasis - 2014      PMH Notes:   1) Prostate cancer: He is s/p a non nerve-sparing RLRP on August 22, 2005. He developed a biochemical recurrence in November 2014. He eventually proceeded with salvage radiation therapy under the care of Dr. Tammi Klippel in November-December 2015. His PSA continued to increase following radiation indicating systemic disease.   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 He has not had a recurrence since dilation.   Jul 2007: Radical prostatectomy  Nov-Dec 2015: Salvage radiation therapy: 68.4 Gy (Dr. Tammi Klippel), PSA 0.51 prior to radiation  May 2019: CT scan - Negative, Bone scan - Negative, PSA 5.31     NON-GU PMH: Pyuria/other UA findings - 05/09/2018 Gout, Gout - 2014 Diabetes Type 2 GERD Glaucoma Hypertension    FAMILY HISTORY: nephrolithiasis - Mother   SOCIAL HISTORY: Marital Status: Married Preferred Language: English; Ethnicity: Not Hispanic Or Latino; Race: White Current Smoking Status: Patient does not smoke anymore. Has not smoked since 10/24/1980.  Social Drinker.  Drinks 1 caffeinated drink per  day.     Notes: Never A Smoker, Alcohol Use, Occupation:   REVIEW OF SYSTEMS:    GU Review Male:   Patient denies frequent urination, hard to postpone urination, burning/ pain with urination, get up at night to urinate, leakage of urine, stream starts and stops, trouble starting your stream, have to strain to urinate , erection problems, and penile pain.  Gastrointestinal (Upper):   Patient denies nausea, vomiting, and indigestion/ heartburn.  Gastrointestinal (Lower):   Patient denies diarrhea and constipation.  Constitutional:   Patient denies fever, night sweats, weight loss, and fatigue.  Skin:   Patient denies skin rash/ lesion and itching.  Eyes:   Patient denies blurred vision and double vision.  Ears/ Nose/ Throat:   Patient denies sore throat and sinus problems.  Hematologic/Lymphatic:   Patient denies swollen glands and easy bruising.  Cardiovascular:   Patient denies chest pains and leg swelling.  Respiratory:   Patient denies cough and shortness of breath.  Endocrine:   Patient denies excessive thirst.  Musculoskeletal:   Patient denies back pain and joint pain.  Neurological:   Patient denies headaches and dizziness.  Psychologic:   Patient denies depression and anxiety.   Notes: Cath not draining only has had 1 bottle of water and a cup of coffee Hematuria    VITAL SIGNS:      05/29/2018 09:56 AM  Weight 230 lb / 104.33 kg  Height 68 in / 172.72 cm  BP 142/81  mmHg  Pulse 103 /min  Temperature 98.2 F / 36.7 C  BMI 35.0 kg/m   GU PHYSICAL EXAMINATION:    Urethral Meatus: Normal size. No lesion, no wart, no discharge, no polyp. Normal location.  Penis: Penile foley catheter present with minimal urine output noted in collection bag. Scant amount of bloody discharged noted from his meatus.    MULTI-SYSTEM PHYSICAL EXAMINATION:    Constitutional: Well-nourished. No physical deformities. Normally developed. Good grooming. Appears uncomfortable on initial exam, which  improved after catheter irrigation.   Neurologic / Psychiatric: Oriented to time, oriented to place, oriented to person. No depression, no anxiety, no agitation.  Musculoskeletal: Normal gait and station of head and neck.     PAST DATA REVIEWED:  Source Of History:  Patient  Records Review:   Previous Patient Records  Urine Test Review:   Urinalysis, Urine Culture   04/20/18 03/16/18 05/10/17 10/26/16 05/04/16 10/28/15 04/18/15 10/17/14  PSA  Total PSA 9.03 ng/mL 10.20 ng/mL 5.31 ng/mL 3.53 ng/mL 2.31 ng/dl 2.00 ng/dl 1.15  0.85     PROCEDURES:          Cath Irrigation - 51700 His catheter was irrigated today with approximatly 500 cc of sterile fluid. There were some small clots with irrigation initially. He drained approximatly 400 cc. He was then irrigated further and his urine cleared to pink tinged with irrigation. His catheter appeared to be draining well into the drainage bag following irrigation.    ASSESSMENT:      ICD-10 Details  1 GU:   Urinary Retention - R33.8   2   Gross hematuria - R31.0    PLAN:            Medications Stop Meds: Glipizide Er 2.5 mg tablet, extended release 24 hr  Discontinue: 05/29/2018  - Reason: The medication cycle was completed.  Pioglitazone Hcl 45 mg tablet Oral  Start: 04/20/2012  Discontinue: 05/29/2018  - Reason: The medication cycle was completed.            Document Letter(s):  Created for Patient: Clinical Summary         Notes:   His catheter was irrigated as noted above today in clinic. There were some small clots noted initially, but cleared to pink tinged with irrigation. He will complete course of Keflex as previously prescribed. I encouraged that he return home and hydrate well. I also recommended that he avoid strenuous physical actives or staining. He is only daily 81 mg Aspirin for preventative purposes and we discussed holding this given persistent hematuria. Case reviewed with Dr. Alinda Money. Will plan to try to schedule  planned procedure for sooner than June, if possible. Return precuations were discussed.         Next Appointment:      Next Appointment: 06/20/2018 10:00 AM    Appointment Type: Nurse Visit NO Urine    Location: [facility] [phone]    Provider: [place]    Reason for Visit: 1 mo cath change/Zechariah Bissonnette      * Signed by Azucena Fallen on 05/29/18 at 10:55 AM (EDT)*

## 2018-06-08 NOTE — Progress Notes (Signed)
Anesthesia Chart Review   Case:  [id number] Date/Time:  06/11/18 0700   Procedure:  CYSTOSCOPY WITH REMOVAL OF BLADDER CALCULUS (N/A )   Anesthesia type:  General   Pre-op diagnosis:  BLADDER CALCULUS   Location:  [place] [facility] / [facility]   Surgeon:  [name], MD      DISCUSSION:78 yo former smoker (30 pack years) with h/o DM II, CKD Stage III, HLD, HTN, GERD, CAD (non obstructive per cath), prostate cancer, bladder calculus scheduled for above procedure 06/11/18 with Dr. [name].   Last seen by PCP, Dr. Lelon Frohlich, via telemedicine 06/01/2018.  Started on Glipizide at this visit.  Otherwise stable.    Pt reports shortness of breath over the last 2 years.  Cardiac etiology ruled out, last seen by cardiology 04/02/2018.  Seen by pulmonolgist Dr. Melvyn Novas 05/23/2018 who states, "Symptoms are markedly disproportionate to objective findings and not clear to what extent this is actually a pulmonary  problem but pt does appear to have difficult to sort out respiratory symptoms of unknown origin".  Dr. Melvyn Novas recommended smoking cessation and weight loss.    Pt s/p cystoscopy 04/29/2018 with general anesthesia, no complications noted.   Pt stable at PAT visit.  Pt can proceed with planned procedure barring acute status change.  VS: BP 132/68 (BP Location: Left Arm)   Pulse 80   Temp 36.7 C (Oral)   Resp 18   Ht 5\' 8"  (1.727 m)   Wt 104 kg   SpO2 99%   BMI 34.87 kg/m   PROVIDERS: Isaac Bliss, Rayford Halsted, MD is PCP    LABS: Labs reviewed: Acceptable for surgery. (all labs ordered are listed, but only abnormal results are displayed)  Labs Reviewed  BASIC METABOLIC PANEL - Abnormal; Notable for the following components:      Result Value   Creatinine, Ser 1.58 (*)    GFR calc non Af Amer 41 (*)    GFR calc Af Amer 48 (*)    All other components within normal limits  CBC - Abnormal; Notable for the following components:   RBC 3.52 (*)    Hemoglobin 12.0 (*)    HCT 36.5 (*)     MCV 103.7 (*)    MCH 34.1 (*)    All other components within normal limits  GLUCOSE, CAPILLARY  HEMOGLOBIN A1C     IMAGES:   EKG: 03/19/2018 Rate 74 bpm Normal sinus rhythm  Left anterior fascicular block  Left ventricular hypertrophy with QRS widening  No significant change since 05/17/15  CV: Echo 04/02/2018 IMPRESSIONS    1. The left ventricle has normal systolic function with an ejection fraction of 60-65%. The cavity size was normal. Left ventricular diastolic Doppler parameters are consistent with impaired relaxation.  2. The right ventricle has normal systolic function. The cavity was normal. There is no increase in right ventricular wall thickness.  3. The tricuspid valve is grossly normal.  4. The aortic valve is tricuspid Mild thickening of the aortic valve Aortic valve regurgitation is trivial by color flow Doppler.  5. Definity used; normal LV systolic function; mild diastolic dysfunction.  Stress Test 03/29/2018  Blood pressure demonstrated a normal response to exercise.  There was no ST segment deviation noted during stress.   ETT with moderately impaired exercise tolerance (4:20); no CP; normal BP response; no ST changes; negative adequate ETT; Duke treadmill score 4.  Cardiac Cath 03/19/2018  Left main, proximal LAD and proximal circumflex calcification noted  on cine fluoroscopy.  25% distal left main.  LAD is widely patent in the ostium.  Beyond the first septal perforator and diagonal contains diffuse 40% narrowing.  The first diagonal contains diffuse mid vessel 50 to 60% narrowing.  Ostial circumflex contains eccentric calcified 30 to 40% narrowing.  Obtuse marginal branches are widely patent.  The third obtuse marginal is dominant of the 4 marginal branches.  Left ventriculography was of poor quality.  Anterior wall moves normally.  EF is estimated to be greater than 50%.  LVEDP was normal at 15 mmHg. RECOMMENDATIONS:   Dyspnea on exertion is of  uncertain etiology.  He needs to have a 2D Doppler echocardiogram done to assess LV thickness and overall wall motion.  The study will also allow assessment of RV and pulmonary artery pressures.  Needs an exercise treadmill test to rule out chronotropic incompetence or severe hypertensive blood pressure response that could account for exertional fatigue and dyspnea.  Needs aggressive secondary risk factor modification to prevent progression of underlying disease as noted above. Past Medical History:  Diagnosis Date  . Bladder calculus   . CAD (coronary artery disease)    non obs   . CKD (chronic kidney disease), stage III ([place])    " i have 35-40% of my kidney function"; mgd by pcp   . COLONIC POLYPS, HX OF 12/02/2008  . DIABETES MELLITUS, TYPE II 07/19/2007  . Dyspnea    reports sob over the last 2 years , underwent cath  in feb 2020 , reports cath was clean , referred to pulmonologist that stopped his Actos and encouraged weight loss  ; reports today minimum improvement in breathing , appt with pulm in June 2020  . Erectile dysfunction   . GERD (gastroesophageal reflux disease)   . Glaucoma   . GOUT 07/19/2007  . HYPERLIPIDEMIA 12/02/2008  . HYPERTENSION 07/19/2007  . Kidney stones   . Prostate cancer ([place])   . Reiter syndrome    incomplete  . Vitamin D deficiency     Past Surgical History:  Procedure Laterality Date  . bladder neck dilation    . CATARACT EXTRACTION     both eyes- 2015  . COLONOSCOPY    . ELBOW SURGERY     bone spur - right  . LEFT HEART CATH AND CORONARY ANGIOGRAPHY N/A 03/19/2018   Procedure: LEFT HEART CATH AND CORONARY ANGIOGRAPHY;  Surgeon: Belva Crome, MD;  Location: [place] [facility];  Service: Cardiovascular;  Laterality: N/A;  . ORIF ANKLE FRACTURE Left 05/17/2015   Procedure: OPEN REDUCTION INTERNAL FIXATION (ORIF) ANKLE FRACTURE;  Surgeon: Meredith Pel, MD;  Location: [facility];  Service: Orthopedics;  Laterality: Left;  . PROSTATECTOMY    .  VASECTOMY      MEDICATIONS: . amLODipine (NORVASC) 5 MG tablet  . aspirin EC 81 MG tablet  . atorvastatin (LIPITOR) 20 MG tablet  . dorzolamide-timolol (COSOPT) 22.3-6.8 MG/ML ophthalmic solution  . glipiZIDE (GLUCOTROL) 5 MG tablet  . glucose blood (ACCU-CHEK AVIVA) test strip  . latanoprost (XALATAN) 0.005 % ophthalmic solution  . losartan (COZAAR) 50 MG tablet  . Vitamin D, Ergocalciferol, (DRISDOL) 1.25 MG (50000 UT) CAPS capsule   No current facility-administered medications for this encounter.     Maia Plan [facility] Pre-Surgical Testing [phone] 06/08/18 11:53 AM

## 2018-06-08 NOTE — Anesthesia Preprocedure Evaluation (Addendum)
Anesthesia Evaluation  Patient identified by MRN, date of birth, ID band Patient awake    Reviewed: Allergy & Precautions, NPO status , Patient's Chart, lab work & pertinent test results  Airway Mallampati: III  TM Distance: <3 FB Neck ROM: Full    Dental no notable dental hx.    Pulmonary neg pulmonary ROS, former smoker,    Pulmonary exam normal breath sounds clear to auscultation       Cardiovascular hypertension, Normal cardiovascular exam Rhythm:Regular Rate:Normal     Neuro/Psych negative neurological ROS  negative psych ROS   GI/Hepatic Neg liver ROS, GERD  ,  Endo/Other  diabetesHypothyroidism Morbid obesity  Renal/GU negative Renal ROS  negative genitourinary   Musculoskeletal negative musculoskeletal ROS (+)   Abdominal   Peds negative pediatric ROS (+)  Hematology negative hematology ROS (+)   Anesthesia Other Findings   Reproductive/Obstetrics negative OB ROS                            Anesthesia Physical Anesthesia Plan  ASA: II  Anesthesia Plan: General   Post-op Pain Management:    Induction: Intravenous  PONV Risk Score and Plan: 2 and Ondansetron, Dexamethasone and Treatment may vary due to age or medical condition  Airway Management Planned: LMA  Additional Equipment:   Intra-op Plan:   Post-operative Plan: Extubation in OR  Informed Consent: I have reviewed the patients History and Physical, chart, labs and discussed the procedure including the risks, benefits and alternatives for the proposed anesthesia with the patient or authorized representative who has indicated his/her understanding and acceptance.     Dental advisory given  Plan Discussed with: CRNA and Surgeon  Anesthesia Plan Comments: (See PAT note 06/08/18, Konrad Felix, PA-C)       Anesthesia Quick Evaluation

## 2018-06-09 LAB — NOVEL CORONAVIRUS, NAA (HOSP ORDER, SEND-OUT TO REF LAB; TAT 18-24 HRS): SARS-CoV-2, NAA: NOT DETECTED

## 2018-06-11 ENCOUNTER — Ambulatory Visit (HOSPITAL_COMMUNITY)
Admission: RE | Admit: 2018-06-11 | Discharge: 2018-06-11 | Disposition: A | Discharge status: Home / Self Care | Payer: Medicare Other | Attending: Urology | Admitting: Urology

## 2018-06-11 ENCOUNTER — Encounter (HOSPITAL_COMMUNITY): Payer: Self-pay | Admitting: *Deleted

## 2018-06-11 ENCOUNTER — Encounter (HOSPITAL_COMMUNITY)
Admission: RE | Disposition: A | Discharge status: Home / Self Care | Payer: Self-pay | Source: Home / Self Care | Attending: Urology

## 2018-06-11 ENCOUNTER — Ambulatory Visit (HOSPITAL_COMMUNITY): Payer: Medicare Other | Admitting: Physician Assistant

## 2018-06-11 DIAGNOSIS — C61 Malignant neoplasm of prostate: Secondary | ICD-10-CM | POA: Insufficient documentation

## 2018-06-11 DIAGNOSIS — Z7982 Long term (current) use of aspirin: Secondary | ICD-10-CM | POA: Diagnosis not present

## 2018-06-11 DIAGNOSIS — Z6834 Body mass index (BMI) 34.0-34.9, adult: Secondary | ICD-10-CM | POA: Insufficient documentation

## 2018-06-11 DIAGNOSIS — Z7984 Long term (current) use of oral hypoglycemic drugs: Secondary | ICD-10-CM | POA: Diagnosis not present

## 2018-06-11 DIAGNOSIS — Z923 Personal history of irradiation: Secondary | ICD-10-CM | POA: Diagnosis not present

## 2018-06-11 DIAGNOSIS — Z79899 Other long term (current) drug therapy: Secondary | ICD-10-CM | POA: Insufficient documentation

## 2018-06-11 DIAGNOSIS — N210 Calculus in bladder: Secondary | ICD-10-CM | POA: Insufficient documentation

## 2018-06-11 DIAGNOSIS — I10 Essential (primary) hypertension: Secondary | ICD-10-CM | POA: Diagnosis not present

## 2018-06-11 DIAGNOSIS — Z87891 Personal history of nicotine dependence: Secondary | ICD-10-CM | POA: Diagnosis not present

## 2018-06-11 DIAGNOSIS — Z1159 Encounter for screening for other viral diseases: Secondary | ICD-10-CM | POA: Insufficient documentation

## 2018-06-11 DIAGNOSIS — R339 Retention of urine, unspecified: Secondary | ICD-10-CM | POA: Insufficient documentation

## 2018-06-11 DIAGNOSIS — N200 Calculus of kidney: Secondary | ICD-10-CM | POA: Diagnosis not present

## 2018-06-11 DIAGNOSIS — E02 Subclinical iodine-deficiency hypothyroidism: Secondary | ICD-10-CM | POA: Diagnosis not present

## 2018-06-11 HISTORY — PX: CYSTOSCOPY: SHX5120

## 2018-06-11 LAB — GLUCOSE, CAPILLARY
Glucose-Capillary: 115 mg/dL — ABNORMAL HIGH (ref 70–99)
Glucose-Capillary: 123 mg/dL — ABNORMAL HIGH (ref 70–99)

## 2018-06-11 SURGERY — CYSTOSCOPY
Anesthesia: General

## 2018-06-11 MED ORDER — CEFAZOLIN SODIUM-DEXTROSE 2-4 GM/100ML-% IV SOLN
2.0000 g | INTRAVENOUS | Status: AC
  Administered 2018-06-11: 2 g via INTRAVENOUS
  Filled 2018-06-11: qty 100

## 2018-06-11 MED ORDER — LIDOCAINE 2% (20 MG/ML) 5 ML SYRINGE
INTRAMUSCULAR | Status: DC | PRN
  Administered 2018-06-11: 100 mg via INTRAVENOUS

## 2018-06-11 MED ORDER — SUCCINYLCHOLINE CHLORIDE 200 MG/10ML IV SOSY
PREFILLED_SYRINGE | INTRAVENOUS | Status: DC | PRN
  Administered 2018-06-11: 100 mg via INTRAVENOUS

## 2018-06-11 MED ORDER — FENTANYL CITRATE (PF) 100 MCG/2ML IJ SOLN: 25.0000 ug | INTRAMUSCULAR | Status: DC | PRN

## 2018-06-11 MED ORDER — DEXAMETHASONE SODIUM PHOSPHATE 10 MG/ML IJ SOLN
INTRAMUSCULAR | Status: AC
  Filled 2018-06-11: qty 1

## 2018-06-11 MED ORDER — DEXAMETHASONE SODIUM PHOSPHATE 10 MG/ML IJ SOLN
INTRAMUSCULAR | Status: DC | PRN
  Administered 2018-06-11: 8 mg via INTRAVENOUS

## 2018-06-11 MED ORDER — ONDANSETRON HCL 4 MG/2ML IJ SOLN
INTRAMUSCULAR | Status: DC | PRN
  Administered 2018-06-11: 4 mg via INTRAVENOUS

## 2018-06-11 MED ORDER — STERILE WATER FOR IRRIGATION IR SOLN
Status: DC | PRN
  Administered 2018-06-11: 5000 mL

## 2018-06-11 MED ORDER — PROPOFOL 10 MG/ML IV BOLUS
INTRAVENOUS | Status: DC | PRN
  Administered 2018-06-11: 50 mg via INTRAVENOUS
  Administered 2018-06-11: 150 mg via INTRAVENOUS

## 2018-06-11 MED ORDER — LIDOCAINE 2% (20 MG/ML) 5 ML SYRINGE
INTRAMUSCULAR | Status: AC
  Filled 2018-06-11: qty 5

## 2018-06-11 MED ORDER — PHENYLEPHRINE 40 MCG/ML (10ML) SYRINGE FOR IV PUSH (FOR BLOOD PRESSURE SUPPORT)
PREFILLED_SYRINGE | INTRAVENOUS | Status: DC | PRN
  Administered 2018-06-11 (×2): 120 ug via INTRAVENOUS

## 2018-06-11 MED ORDER — SUCCINYLCHOLINE CHLORIDE 200 MG/10ML IV SOSY
PREFILLED_SYRINGE | INTRAVENOUS | Status: AC
  Filled 2018-06-11: qty 10

## 2018-06-11 MED ORDER — FENTANYL CITRATE (PF) 100 MCG/2ML IJ SOLN
INTRAMUSCULAR | Status: AC
  Filled 2018-06-11: qty 2

## 2018-06-11 MED ORDER — STERILE WATER FOR IRRIGATION IR SOLN
Status: DC | PRN
  Administered 2018-06-11: 3000 mL

## 2018-06-11 MED ORDER — OXYCODONE HCL 5 MG PO TABS: 5.0000 mg | ORAL_TABLET | Freq: Once | ORAL | Status: DC | PRN

## 2018-06-11 MED ORDER — PHENYLEPHRINE 40 MCG/ML (10ML) SYRINGE FOR IV PUSH (FOR BLOOD PRESSURE SUPPORT)
PREFILLED_SYRINGE | INTRAVENOUS | Status: AC
  Filled 2018-06-11: qty 10

## 2018-06-11 MED ORDER — PROPOFOL 10 MG/ML IV BOLUS
INTRAVENOUS | Status: AC
  Filled 2018-06-11: qty 20

## 2018-06-11 MED ORDER — SULFAMETHOXAZOLE-TRIMETHOPRIM 800-160 MG PO TABS: 1.0000 | ORAL_TABLET | Freq: Two times a day (BID) | ORAL | 0 refills | Status: DC

## 2018-06-11 MED ORDER — PROMETHAZINE HCL 25 MG/ML IJ SOLN: 6.2500 mg | INTRAMUSCULAR | Status: DC | PRN

## 2018-06-11 MED ORDER — LACTATED RINGERS IV SOLN
INTRAVENOUS | Status: DC
  Administered 2018-06-11: 07:00:00 via INTRAVENOUS

## 2018-06-11 MED ORDER — FENTANYL CITRATE (PF) 100 MCG/2ML IJ SOLN
INTRAMUSCULAR | Status: DC | PRN
  Administered 2018-06-11: 50 ug via INTRAVENOUS

## 2018-06-11 MED ORDER — ONDANSETRON HCL 4 MG/2ML IJ SOLN
INTRAMUSCULAR | Status: AC
  Filled 2018-06-11: qty 2

## 2018-06-11 MED ORDER — OXYCODONE HCL 5 MG/5ML PO SOLN: 5.0000 mg | Freq: Once | ORAL | Status: DC | PRN

## 2018-06-11 SURGICAL SUPPLY — 16 items
BAG URINE DRAINAGE (UROLOGICAL SUPPLIES) ×2 IMPLANT
BAG URO CATCHER STRL LF (MISCELLANEOUS) ×3 IMPLANT
CATH FOLEY 2WAY SLVR  5CC 18FR (CATHETERS) ×2
CATH FOLEY 2WAY SLVR 5CC 18FR (CATHETERS) IMPLANT
CATH INTERMIT  6FR 70CM (CATHETERS) ×1 IMPLANT
CLOTH BEACON ORANGE TIMEOUT ST (SAFETY) ×3 IMPLANT
COVER WAND RF STERILE (DRAPES) IMPLANT
GLOVE BIOGEL M STRL SZ7.5 (GLOVE) ×3 IMPLANT
GOWN STRL REUS W/TWL LRG LVL3 (GOWN DISPOSABLE) ×6 IMPLANT
GUIDEWIRE STR DUAL SENSOR (WIRE) ×1 IMPLANT
KIT TURNOVER KIT A (KITS) ×2 IMPLANT
MANIFOLD NEPTUNE II (INSTRUMENTS) ×3 IMPLANT
PACK CYSTO (CUSTOM PROCEDURE TRAY) ×3 IMPLANT
TUBING CONNECTING 10 (TUBING) ×2 IMPLANT
TUBING CONNECTING 10' (TUBING) ×1
TUBING UROLOGY SET (TUBING) IMPLANT

## 2018-06-11 NOTE — Discharge Instructions (Addendum)

## 2018-06-11 NOTE — Anesthesia Postprocedure Evaluation (Signed)
Anesthesia Post Note  Patient: Rodney [name]  Procedure(s) Performed: CYSTOSCOPY WITH REMOVAL OF BLADDER CALCULUS (N/A )     Patient location during evaluation: PACU Anesthesia Type: General Level of consciousness: awake and alert Pain management: pain level controlled Vital Signs Assessment: post-procedure vital signs reviewed and stable Respiratory status: spontaneous breathing, nonlabored ventilation, respiratory function stable and patient connected to nasal cannula oxygen Cardiovascular status: blood pressure returned to baseline and stable Postop Assessment: no apparent nausea or vomiting Anesthetic complications: no    Last Vitals:  Vitals:   06/11/18 0545 06/11/18 0804  BP: 135/71 127/80  Pulse: 88 75  Resp: 18 12  Temp: 37.1 C (!) 36.4 C  SpO2: 100% 100%    Last Pain:  Vitals:   06/11/18 0804  TempSrc:   PainSc: Asleep                 Abagayle Klutts S

## 2018-06-11 NOTE — Anesthesia Procedure Notes (Signed)
Procedure Name: Intubation Date/Time: 06/11/2018 7:33 AM Performed by: Niel Hummer, CRNA Pre-anesthesia Checklist: Patient being monitored, Suction available, Emergency Drugs available and Patient identified Patient Re-evaluated:Patient Re-evaluated prior to induction Oxygen Delivery Method: Circle system utilized Preoxygenation: Pre-oxygenation with 100% oxygen Induction Type: IV induction Laryngoscope Size: Mac and 4 Grade View: Grade I Tube type: Oral Tube size: 7.5 mm Number of attempts: 1 Airway Equipment and Method: Stylet Placement Confirmation: ETT inserted through vocal cords under direct vision,  positive ETCO2 and breath sounds checked- equal and bilateral Secured at: 23 cm Tube secured with: Tape Dental Injury: Teeth and Oropharynx as per pre-operative assessment  Comments: Attempt to place LMA 5, unable to seat. DL x1 with MAC 4, tube placement successful.

## 2018-06-11 NOTE — Op Note (Signed)
Preoperative diagnosis: Bladder calculus and urinary retention  Postoperative diagnosis: Bladder calculus and urinary retention  Procedures: 1.  Cystoscopy 2.  Removal of bladder calculus and underlying foreign body at the bladder neck  Surgeon: Pryor Curia. MD  Anesthesia: General  Complications: None  EBL: Minimal  Intraoperative findings: A calculus was located at the bladder neck at approximately 7:00.  This did appear to be densely adherent to the bladder neck and was not able to be mobilized from the bladder neck with the cystoscope.  Description of procedure: The patient was taken to the operating room and a general anesthetic was administered.  He was given preoperative antibiotics, placed in the dorsolithotomy position, and prepped and draped in the usual sterile fashion.  A preoperative timeout was performed.  Cystourethroscopy was first performed with the 22 French cystoscope and a 30 degree lens.  The bladder did appear to be somewhat erythematous and edematous consistent with his indwelling catheter.  Further inspection did reveal a bladder calculus on the right posterior side of the urethra at approximately the 7 o'clock position.  Initially, I attempted to remove this with a flexible grasper.  However, the stone was densely adherent to the bladder neck and was not able to be mobilized off the bladder neck.  I then used a rigid biopsy forceps and was able to dislodge the stone.  After the stone was dislodged, an underlying foreign body was identified and this also was able to be removed with the rigid biopsy forceps.  On further inspection, this was consistent with a hemo-lock clip likely related to his radical prostatectomy 13 years ago.  Further inspection revealed no further evidence of any underlying foreign body or stone.  An 60 French catheter was replaced.  The patient tolerated the procedure well without complications.  He was able to be awakened and transferred to  recovery in satisfactory condition.

## 2018-06-11 NOTE — Transfer of Care (Signed)
Immediate Anesthesia Transfer of Care Note  Patient: Rodney [name] [facility]  Procedure(s) Performed: CYSTOSCOPY WITH REMOVAL OF BLADDER CALCULUS (N/A )  Patient Location: PACU  Anesthesia Type:General  Level of Consciousness: awake, alert  and oriented  Airway & Oxygen Therapy: Patient Spontanous Breathing and Patient connected to face mask oxygen  Post-op Assessment: Report given to RN and Post -op Vital signs reviewed and stable  Post vital signs: Reviewed and stable  Last Vitals:  Vitals Value Taken Time  BP 127/80 06/11/2018  8:04 AM  Temp    Pulse 76 06/11/2018  8:05 AM  Resp 11 06/11/2018  8:05 AM  SpO2 100 % 06/11/2018  8:05 AM  Vitals shown include unvalidated device data.  Last Pain:  Vitals:   06/11/18 0600  TempSrc:   PainSc: 0-No pain      Patients Stated Pain Goal: 5 (14/15/97 3312)  Complications: No apparent anesthesia complications

## 2018-06-11 NOTE — Progress Notes (Signed)
D/C inst. Given to pt and wife Opal Sidles via phone

## 2018-06-11 NOTE — Interval H&P Note (Signed)
History and Physical Interval Note:  06/11/2018 7:01 AM  Rodney [name]  has presented today for surgery, with the diagnosis of BLADDER CALCULUS.  The various methods of treatment have been discussed with the patient and family. After consideration of risks, benefits and other options for treatment, the patient has consented to  Procedure(s): CYSTOSCOPY WITH REMOVAL OF BLADDER CALCULUS (N/A) as a surgical intervention.  The patient's history has been reviewed, patient examined, no change in status, stable for surgery.  I have reviewed the patient's chart and labs.  Questions were answered to the patient's satisfaction.     [employer]

## 2018-06-12 ENCOUNTER — Other Ambulatory Visit: Payer: Self-pay | Admitting: Internal Medicine

## 2018-06-12 ENCOUNTER — Encounter (HOSPITAL_COMMUNITY): Payer: Self-pay | Admitting: Urology

## 2018-06-14 DIAGNOSIS — R338 Other retention of urine: Secondary | ICD-10-CM | POA: Diagnosis not present

## 2018-06-21 ENCOUNTER — Other Ambulatory Visit: Payer: Self-pay | Admitting: Internal Medicine

## 2018-06-22 ENCOUNTER — Other Ambulatory Visit: Payer: Self-pay | Admitting: Nurse Practitioner

## 2018-06-22 MED ORDER — AMLODIPINE BESYLATE 5 MG PO TABS: 5.0000 mg | ORAL_TABLET | Freq: Two times a day (BID) | ORAL | 2 refills | Status: DC

## 2018-06-25 DIAGNOSIS — E119 Type 2 diabetes mellitus without complications: Secondary | ICD-10-CM | POA: Diagnosis not present

## 2018-06-25 DIAGNOSIS — H5203 Hypermetropia, bilateral: Secondary | ICD-10-CM | POA: Diagnosis not present

## 2018-06-25 LAB — HM DIABETES EYE EXAM

## 2018-06-29 ENCOUNTER — Encounter (HOSPITAL_COMMUNITY): Payer: Self-pay | Admitting: Emergency Medicine

## 2018-06-29 ENCOUNTER — Other Ambulatory Visit: Payer: Self-pay

## 2018-06-29 ENCOUNTER — Emergency Department (HOSPITAL_COMMUNITY)
Admission: EM | Admit: 2018-06-29 | Discharge: 2018-06-29 | Disposition: A | Discharge status: Home / Self Care | Payer: Medicare Other | Attending: Emergency Medicine | Admitting: Emergency Medicine

## 2018-06-29 DIAGNOSIS — Z7982 Long term (current) use of aspirin: Secondary | ICD-10-CM | POA: Diagnosis not present

## 2018-06-29 DIAGNOSIS — Z87891 Personal history of nicotine dependence: Secondary | ICD-10-CM | POA: Diagnosis not present

## 2018-06-29 DIAGNOSIS — E1122 Type 2 diabetes mellitus with diabetic chronic kidney disease: Secondary | ICD-10-CM | POA: Diagnosis not present

## 2018-06-29 DIAGNOSIS — R339 Retention of urine, unspecified: Secondary | ICD-10-CM | POA: Insufficient documentation

## 2018-06-29 DIAGNOSIS — N183 Chronic kidney disease, stage 3 (moderate): Secondary | ICD-10-CM | POA: Insufficient documentation

## 2018-06-29 DIAGNOSIS — Z79899 Other long term (current) drug therapy: Secondary | ICD-10-CM | POA: Insufficient documentation

## 2018-06-29 DIAGNOSIS — I129 Hypertensive chronic kidney disease with stage 1 through stage 4 chronic kidney disease, or unspecified chronic kidney disease: Secondary | ICD-10-CM | POA: Insufficient documentation

## 2018-06-29 DIAGNOSIS — Z7984 Long term (current) use of oral hypoglycemic drugs: Secondary | ICD-10-CM | POA: Insufficient documentation

## 2018-06-29 DIAGNOSIS — I2510 Atherosclerotic heart disease of native coronary artery without angina pectoris: Secondary | ICD-10-CM | POA: Insufficient documentation

## 2018-06-29 DIAGNOSIS — R338 Other retention of urine: Secondary | ICD-10-CM

## 2018-06-29 LAB — URINALYSIS, ROUTINE W REFLEX MICROSCOPIC
Bacteria, UA: NONE SEEN
Bilirubin Urine: NEGATIVE
Glucose, UA: NEGATIVE mg/dL
Ketones, ur: NEGATIVE mg/dL
Nitrite: NEGATIVE
Protein, ur: NEGATIVE mg/dL
Specific Gravity, Urine: 1.008 (ref 1.005–1.030)
pH: 6 (ref 5.0–8.0)

## 2018-06-29 NOTE — ED Notes (Signed)
Pt collection bag changed to leg bag.

## 2018-06-29 NOTE — ED Triage Notes (Signed)
Patient states he had a stricture and had surgery on 07/12/2018. Patient states they took the catheter out and it was working ok. Patient states that he started not being able to urinate last night.

## 2018-06-29 NOTE — ED Notes (Signed)
Bladder Scan 371mL. Provider Notified.

## 2018-06-29 NOTE — ED Provider Notes (Signed)
TIME SEEN: 2:36 AM  CHIEF COMPLAINT: Urinary retention  HPI: Patient is a 78 year old male with history of hypertension, diabetes, hyperlipidemia, chronic kidney disease, prostate cancer status post prostatectomy who presents to the emergency department with urinary retention.  States he has not been able to urinate since about 9 PM last night.  He has abdominal discomfort.  No fevers, chills, nausea, vomiting or diarrhea.  Reports some dysuria and hematuria.  Patient underwent cystoscopy on Jun 11, 2018 with Dr. Alinda Money for bladder calculus and urinary retention.  Had an 45 French Foley catheter placed at that time which he states was removed on May 20.  He has had previous urinary retention.  He was seen at [facility] in [place] on April 5 for acute urinary retention and underwent cystoscopy with bladder neck incision was discharged with an 18 French indwelling Foley catheter.  Foley catheter was removed on April 9 with his urologist here in [place] at [facility] but then he had to return to the emergency department on April 10 with urinary retention and needed another Foley catheter placed at that time.  ROS: See HPI Constitutional: no fever  Eyes: no drainage  ENT: no runny nose   Cardiovascular:  no chest pain  Resp: no SOB  GI: no vomiting GU: no dysuria Integumentary: no rash  Allergy: no hives  Musculoskeletal: no leg swelling  Neurological: no slurred speech ROS otherwise negative  PAST MEDICAL HISTORY/PAST SURGICAL HISTORY:  Past Medical History:  Diagnosis Date  . Bladder calculus   . CAD (coronary artery disease)    non obs   . CKD (chronic kidney disease), stage III ([place])    " i have 35-40% of my kidney function"; mgd by pcp   . COLONIC POLYPS, HX OF 12/02/2008  . DIABETES MELLITUS, TYPE II 07/19/2007  . Dyspnea    reports sob over the last 2 years , underwent cath  in feb 2020 , reports cath was clean , referred to pulmonologist that stopped  his Actos and encouraged weight loss  ; reports today minimum improvement in breathing , appt with pulm in June 2020  . Erectile dysfunction   . GERD (gastroesophageal reflux disease)   . Glaucoma   . GOUT 07/19/2007  . HYPERLIPIDEMIA 12/02/2008  . HYPERTENSION 07/19/2007  . Kidney stones   . Prostate cancer ([place])   . Reiter syndrome    incomplete  . Vitamin D deficiency     MEDICATIONS:  Prior to Admission medications   Medication Sig Start Date End Date Taking? Authorizing Provider  amLODipine (NORVASC) 5 MG tablet Take 1 tablet (5 mg total) by mouth 2 (two) times daily. 06/22/18   Burtis Junes, NP  aspirin EC 81 MG tablet Take 1 tablet (81 mg total) by mouth daily. 04/02/18   Burtis Junes, NP  atorvastatin (LIPITOR) 20 MG tablet Take 1 tablet by mouth once daily 06/12/18   Isaac Bliss, Rayford Halsted, MD  dorzolamide-timolol (COSOPT) 22.3-6.8 MG/ML ophthalmic solution Place 1 drop into both eyes daily.  09/20/13   [provider]  glipiZIDE (GLUCOTROL) 5 MG tablet Take 1 tablet (5 mg total) by mouth daily before breakfast. Patient taking differently: Take 2.5 mg by mouth daily before breakfast.  06/01/18   Isaac Bliss, Rayford Halsted, MD  glucose blood (ACCU-CHEK AVIVA) test strip 1 each by Other route 2 (two) times daily. 06/24/11   Marletta Lor, MD  latanoprost (XALATAN) 0.005 % ophthalmic solution Place 1 drop into  both eyes at bedtime.     [provider]  losartan (COZAAR) 50 MG tablet Take 50 mg by mouth daily.    [provider]  sulfamethoxazole-trimethoprim (BACTRIM DS) 800-160 MG tablet Take 1 tablet by mouth 2 (two) times daily. 06/11/18   [name], MD  Vitamin D, Ergocalciferol, (DRISDOL) 1.25 MG (50000 UT) CAPS capsule Take 1 capsule (50,000 Units total) by mouth every 7 (seven) days. 04/04/18   Erline Hau, MD    ALLERGIES:  Allergies  Allergen Reactions  . Ace Inhibitors     Tongue swelling. Reaction to benazepril  only.     SOCIAL HISTORY:  Social History   Tobacco Use  . Smoking status: Former Smoker    Packs/day: 1.00    Years: 30.00    Pack years: 30.00    Types: Cigarettes    Last attempt to quit: 01/24/1978    Years since quitting: 40.4  . Smokeless tobacco: Never Used  Substance Use Topics  . Alcohol use: Not Currently    Comment: occasionally -socially    FAMILY HISTORY: Family History  Problem Relation Age of Onset  . Cancer Sister        metastatic colon ca  . Colon cancer Neg Hx   . Esophageal cancer Neg Hx   . Rectal cancer Neg Hx   . Stomach cancer Neg Hx   . Lung disease Neg Hx     EXAM: BP 129/81 (BP Location: Left Arm)   Pulse 84   Temp 97.9 F (36.6 C) (Oral)   Resp 18   Ht 5\' 8"  (1.727 m)   Wt 104.3 kg   SpO2 100%   BMI 34.97 kg/m  CONSTITUTIONAL: Alert and oriented and responds appropriately to questions.  Elderly, obese, no significant distress HEAD: Normocephalic EYES: Conjunctivae clear, pupils appear equal ENT: normal nose; moist mucous membranes NECK: Normal range of motion CARD: RRR RESP: Normal chest excursion without splinting or tachypnea;  no hypoxia or respiratory distress, speaking full sentences ABD/GI: Normal bowel sounds; obese with large abdomen limiting examination, tender throughout the lower abdomen without guarding or rebound BACK: Normal range of motion EXT: Normal ROM in all joints; no obvious deformity SKIN: Normal color for age and race; warm; no rash on exposed skin NEURO: Moves all extremities equally PSYCH: The patient's mood and manner are appropriate. Grooming and personal hygiene are appropriate.  MEDICAL DECISION MAKING: Patient here with urinary retention.  Over 350 mL of urine seen on bladder scan.  Will place Foley catheter.  He complains of dysuria and hematuria.  Will send urinalysis and urine culture.  ED PROGRESS: Patient reports feeling much better after Foley catheter placed.  Urine shows trace leukocyte esterase  but otherwise unremarkable with no bacteria.  Culture is pending.  [facility].  Discussed return precautions and Foley catheter care instructions.   At this time, I do not feel there is any life-threatening condition present. I have reviewed and discussed all results (EKG, imaging, lab, urine as appropriate) and exam findings with patient/family. I have reviewed nursing notes and appropriate previous records.  I feel the patient is safe to be discharged home without further emergent workup and can continue workup as an outpatient as needed. Discussed usual and customary return precautions. Patient/family verbalize understanding and are comfortable with this plan.  Outpatient follow-up has been provided as needed. All questions have been answered.      Sinead Hockman, Delice Bison, DO 06/29/18 [phone]

## 2018-06-30 LAB — URINE CULTURE: Culture: NO GROWTH

## 2018-07-04 DIAGNOSIS — R338 Other retention of urine: Secondary | ICD-10-CM | POA: Diagnosis not present

## 2018-07-05 ENCOUNTER — Other Ambulatory Visit (HOSPITAL_COMMUNITY): Payer: Medicare Other

## 2018-07-09 ENCOUNTER — Ambulatory Visit: Payer: Medicare Other | Admitting: Internal Medicine

## 2018-07-10 ENCOUNTER — Other Ambulatory Visit (INDEPENDENT_AMBULATORY_CARE_PROVIDER_SITE_OTHER): Payer: Medicare Other

## 2018-07-10 ENCOUNTER — Other Ambulatory Visit: Payer: Self-pay

## 2018-07-10 DIAGNOSIS — N183 Chronic kidney disease, stage 3 unspecified: Secondary | ICD-10-CM

## 2018-07-10 DIAGNOSIS — Z1329 Encounter for screening for other suspected endocrine disorder: Secondary | ICD-10-CM | POA: Diagnosis not present

## 2018-07-10 DIAGNOSIS — R7989 Other specified abnormal findings of blood chemistry: Secondary | ICD-10-CM | POA: Diagnosis not present

## 2018-07-10 DIAGNOSIS — R946 Abnormal results of thyroid function studies: Secondary | ICD-10-CM | POA: Diagnosis not present

## 2018-07-10 LAB — BASIC METABOLIC PANEL
BUN: 18 mg/dL (ref 6–23)
CO2: 26 mEq/L (ref 19–32)
Calcium: 9.1 mg/dL (ref 8.4–10.5)
Chloride: 105 mEq/L (ref 96–112)
Creatinine, Ser: 1.53 mg/dL — ABNORMAL HIGH (ref 0.40–1.50)
GFR: 44.23 mL/min — ABNORMAL LOW (ref >60.00)
Glucose, Bld: 176 mg/dL — ABNORMAL HIGH (ref 70–99)
Potassium: 4 mEq/L (ref 3.5–5.1)
Sodium: 139 mEq/L (ref 135–145)

## 2018-07-10 LAB — TSH: TSH: 3.4 u[IU]/mL (ref 0.35–4.50)

## 2018-07-10 NOTE — Addendum Note (Signed)
Addended by: Gwynne Edinger on: 07/10/2018 08:27 AM   Modules accepted: Orders

## 2018-07-11 LAB — T4: T4, Total: 9.3 ug/dL (ref 4.9–10.5)

## 2018-07-11 LAB — T3: T3, Total: 101 ng/dL (ref 76–181)

## 2018-07-13 ENCOUNTER — Encounter: Payer: Self-pay | Admitting: Internal Medicine

## 2018-09-09 ENCOUNTER — Other Ambulatory Visit: Payer: Self-pay | Admitting: Internal Medicine

## 2018-09-25 DIAGNOSIS — M62838 Other muscle spasm: Secondary | ICD-10-CM | POA: Diagnosis not present

## 2018-09-25 DIAGNOSIS — M6281 Muscle weakness (generalized): Secondary | ICD-10-CM | POA: Diagnosis not present

## 2018-09-25 DIAGNOSIS — R338 Other retention of urine: Secondary | ICD-10-CM | POA: Diagnosis not present

## 2018-10-16 DIAGNOSIS — M62838 Other muscle spasm: Secondary | ICD-10-CM | POA: Diagnosis not present

## 2018-10-16 DIAGNOSIS — M6281 Muscle weakness (generalized): Secondary | ICD-10-CM | POA: Diagnosis not present

## 2018-10-23 DIAGNOSIS — M6281 Muscle weakness (generalized): Secondary | ICD-10-CM | POA: Diagnosis not present

## 2018-10-23 DIAGNOSIS — M62838 Other muscle spasm: Secondary | ICD-10-CM | POA: Diagnosis not present

## 2018-11-27 ENCOUNTER — Other Ambulatory Visit: Payer: Self-pay | Admitting: Internal Medicine

## 2018-11-27 DIAGNOSIS — N183 Chronic kidney disease, stage 3 unspecified: Secondary | ICD-10-CM

## 2018-11-27 DIAGNOSIS — E1122 Type 2 diabetes mellitus with diabetic chronic kidney disease: Secondary | ICD-10-CM

## 2018-12-03 DIAGNOSIS — H20012 Primary iridocyclitis, left eye: Secondary | ICD-10-CM | POA: Diagnosis not present

## 2018-12-05 ENCOUNTER — Other Ambulatory Visit: Payer: Self-pay | Admitting: Internal Medicine

## 2018-12-05 DIAGNOSIS — H20022 Recurrent acute iridocyclitis, left eye: Secondary | ICD-10-CM | POA: Diagnosis not present

## 2018-12-05 MED ORDER — LOSARTAN POTASSIUM 50 MG PO TABS: 50.0000 mg | ORAL_TABLET | Freq: Every day | ORAL | 1 refills | Status: DC

## 2018-12-05 NOTE — Telephone Encounter (Signed)
Requested medication (s) are due for refill today: yes  Requested medication (s) are on the active medication list: yes  Last refill:  06/11/2018  Future visit scheduled: no  Notes to clinic:  Review for refill Overdue for office visit    Requested Prescriptions  Pending Prescriptions Disp Refills   losartan (COZAAR) 50 MG tablet       Sig: Take 1 tablet (50 mg total) by mouth daily.     Cardiovascular:  Angiotensin Receptor Blockers Failed - 12/05/2018  9:48 AM      Failed - Cr in normal range and within 180 days    Creatinine, Ser  Date Value Ref Range Status  07/10/2018 1.53 (H) 0.40 - 1.50 mg/dL Final         Failed - Valid encounter within last 6 months    Recent Outpatient Visits          6 months ago Type 2 diabetes mellitus with stage 3 chronic kidney disease, without long-term current use of insulin ([place])   [place] at [facility], Rayford Halsted, MD   8 months ago Dyspnea on exertion   [place] [facility], Rayford Halsted, MD   10 months ago Chest pressure   [place] at [facility], Ishmael Holter, MD   10 months ago Essential hypertension   [facility] [place], [place]   11 months ago Type 2 diabetes mellitus with stage 3 chronic kidney disease, without long-term current use of insulin ([place])   [place] at [facility], Rayford Halsted, MD             Passed - K in normal range and within 180 days    Potassium  Date Value Ref Range Status  07/10/2018 4.0 3.5 - 5.1 mEq/L Final         Passed - Patient is not pregnant      Passed - Last BP in normal range    BP Readings from Last 1 Encounters:  06/29/18 117/73

## 2018-12-05 NOTE — Telephone Encounter (Signed)
Medication Refill - Medication: losartan (COZAAR) 50 MG tablet  Has the patient contacted their pharmacy? Yes - states that they haven't heard back from [place].  Pt is completely out of medication. (Agent: If no, request that the patient contact the pharmacy for the refill.) (Agent: If yes, when and what did the pharmacy advise?)  Preferred Pharmacy (with phone number or street name):  [place], [place] - [id number] [phone] (Phone) [phone] (Fax)   Agent: Please be advised that RX refills may take up to 3 business days. We ask that you follow-up with your pharmacy.

## 2018-12-07 DIAGNOSIS — H20042 Secondary noninfectious iridocyclitis, left eye: Secondary | ICD-10-CM | POA: Diagnosis not present

## 2018-12-11 DIAGNOSIS — H20042 Secondary noninfectious iridocyclitis, left eye: Secondary | ICD-10-CM | POA: Diagnosis not present

## 2018-12-11 DIAGNOSIS — H524 Presbyopia: Secondary | ICD-10-CM | POA: Diagnosis not present

## 2018-12-25 DIAGNOSIS — H20022 Recurrent acute iridocyclitis, left eye: Secondary | ICD-10-CM | POA: Diagnosis not present

## 2019-01-07 DIAGNOSIS — H401131 Primary open-angle glaucoma, bilateral, mild stage: Secondary | ICD-10-CM | POA: Diagnosis not present

## 2019-02-13 ENCOUNTER — Other Ambulatory Visit: Payer: Self-pay | Admitting: Internal Medicine

## 2019-02-13 DIAGNOSIS — E1122 Type 2 diabetes mellitus with diabetic chronic kidney disease: Secondary | ICD-10-CM

## 2019-03-26 ENCOUNTER — Other Ambulatory Visit: Payer: Self-pay | Admitting: Nurse Practitioner

## 2019-03-26 ENCOUNTER — Other Ambulatory Visit: Payer: Self-pay | Admitting: Internal Medicine

## 2019-03-30 ENCOUNTER — Ambulatory Visit: Payer: Medicare Other | Attending: Internal Medicine

## 2019-03-30 DIAGNOSIS — Z23 Encounter for immunization: Secondary | ICD-10-CM | POA: Insufficient documentation

## 2019-03-30 NOTE — Progress Notes (Signed)
   Covid-19 Vaccination Clinic  Name:  Rodney [name]    MRN: [id number] DOB: October 31, 1940  03/30/2019  Rodney [name] was observed post Covid-19 immunization for 15 minutes without incident. He was provided with Vaccine Information Sheet and instruction to access the V-Safe system.   Rodney [name] was instructed to call 911 with any severe reactions post vaccine: Marland Kitchen Difficulty breathing  . Swelling of face and throat  . A fast heartbeat  . A bad rash all over body  . Dizziness and weakness   Immunizations Administered    Name Date Dose VIS Date Route   Pfizer COVID-19 Vaccine 03/30/2019  2:08 PM 0.3 mL 01/04/2019 Intramuscular   Manufacturer: [place]   Lot: [id number]   [place]: [id number]

## 2019-04-03 ENCOUNTER — Other Ambulatory Visit: Payer: Self-pay | Admitting: Urology

## 2019-04-05 DIAGNOSIS — H20032 Secondary infectious iridocyclitis, left eye: Secondary | ICD-10-CM | POA: Diagnosis not present

## 2019-04-05 DIAGNOSIS — H524 Presbyopia: Secondary | ICD-10-CM | POA: Diagnosis not present

## 2019-04-08 DIAGNOSIS — H524 Presbyopia: Secondary | ICD-10-CM | POA: Diagnosis not present

## 2019-04-08 DIAGNOSIS — H20012 Primary iridocyclitis, left eye: Secondary | ICD-10-CM | POA: Diagnosis not present

## 2019-04-16 DIAGNOSIS — H401131 Primary open-angle glaucoma, bilateral, mild stage: Secondary | ICD-10-CM | POA: Diagnosis not present

## 2019-04-20 ENCOUNTER — Ambulatory Visit: Payer: Medicare Other | Attending: Internal Medicine

## 2019-04-20 DIAGNOSIS — Z23 Encounter for immunization: Secondary | ICD-10-CM

## 2019-04-20 NOTE — Progress Notes (Signed)
   Covid-19 Vaccination Clinic  Name:  Rodney [name]    MRN: [id number] DOB: 10-11-1940  04/20/2019  Rodney [name] was observed post Covid-19 immunization for 15 minutes without incident. He was provided with Vaccine Information Sheet and instruction to access the V-Safe system.   Rodney [name] was instructed to call 911 with any severe reactions post vaccine: Marland Kitchen Difficulty breathing  . Swelling of face and throat  . A fast heartbeat  . A bad rash all over body  . Dizziness and weakness   Immunizations Administered    Name Date Dose VIS Date Route   Pfizer COVID-19 Vaccine 04/20/2019 12:58 PM 0.3 mL 01/04/2019 Intramuscular   Manufacturer: [place]   Lot: [id number]   [name]: [id number]

## 2019-04-30 ENCOUNTER — Other Ambulatory Visit: Payer: Self-pay | Admitting: Nurse Practitioner

## 2019-04-30 ENCOUNTER — Other Ambulatory Visit: Payer: Self-pay | Admitting: Internal Medicine

## 2019-04-30 DIAGNOSIS — E1122 Type 2 diabetes mellitus with diabetic chronic kidney disease: Secondary | ICD-10-CM

## 2019-05-01 ENCOUNTER — Ambulatory Visit: Payer: Medicare Other

## 2019-05-01 DIAGNOSIS — H20012 Primary iridocyclitis, left eye: Secondary | ICD-10-CM | POA: Diagnosis not present

## 2019-05-02 NOTE — H&P (Signed)
I was consulted by the above provider to assess the patient's urinary incontinence. He had a radical prostatectomy 2007. He had a hemo lock clip removed and he went from 1 pad per day incontinence to severe incontinence. He has been treated by physical therapy. He has also had radiation 5 years ago   Patient leaks with coughing sneezing bending activity. He does not get bladder urgency. He leaks without awareness. He wears 3 heavy pads while he is sleeping. He soaks 4 pads a day. He does not void and all the urines in the pad   Prior to April he was wearing 1 pad a day. He describes going into retention and [facility]. He described the procedure in came home with a catheter. He we subsequently had removal of the clip with stone. Based upon the history I think he was leaking prior to his stone procedure since [place] but most the time he had a catheter a number times and was delayed due to virus.   He is right-handed with no hernia surgery.   On physical examination patient had a diffuse groin skin redness up and a little bit of the scrotum. It was in keeping with urinary dermatitis. It is not treated. He leaks with gravity and standing. He has a very large abdomen but flatter suprapubic area.   Patient developed total urinary incontinence following bladder neck procedure and had developed retention. The role of cystoscopy and urodynamics discussed. He has had radiation and could form another stone. I will have to speak to Dr. B to ask him how large the stone was. I do not think was in the operative note. I prescribed 123 cream today to use once or twice daily and I will after recheck his perineum and scrotum when I perform cystoscopy in the future if he is going to proceed with an artificial urinary sphincter.   Radiation changes will be checked with cystoscopy. Bladder capacity will also be checked even if we need to occlude the bladder neck recognizing the patient has had radiation.    During urodynamics patient emptied efficiently. In the seated position he was leaking around the urodynamics catheter. During urodynamics patient emptied efficiently. In the seated position he was leaking around the urodynamics catheter. Foley and coude catheter could not be inserted. He was laid back to try to fill his bladder. Maximum bladder capacity is 200 mL. He had sensory urgency. He had unstable bladder reaching pressure of 8 cm of water and leaked a moderate amount. His Valsalva leak point pressure 100 mL was 47 cm water leaking moderate amount. At 180 mL his Valsalva leak point pressure of 29 cm water leaking a moderate severe amount. There was artifact during the study. He had trouble to initiate urination. He generated low-pressure contractions of 4 cm of water. He voided 187 mL with a maximum flow of 2 mL/second. Fluoroscopically he had a very odd shaped bladder almost like it tear drop. He had right-sided reflux. The details of the urodynamics or sign and dictated   During cystoscopy the penile bulbar urethra normal. He had a rigid bladder neck that was approximately 17 [place] in size based upon the size a flexible cystoscope that would just passed through it with a little bit of pressure. No foreign body. He had a very tubular shaped bladder and I did not see any radiation changes. It did not fill up like a severe. The bladder neck is pale and scarred   I would like to try  the patient on combination treatment. I gave him 6 weeks of Myrbetriq 50 mg samples and called on oxybutynin ER 10 mg. I will then discuss an artificial sphincter with him in detail next time. He would be at risk of symptomatic bladder neck contracture and or stone or or more rarely a staple in the future. This would be more difficult to manage. I am not ordering a CT scan of the pelvis recognizing the odd shape of his bladder   Patient will need to know that he is at much higher risk of incontinence and any overactive  bladder symptom if he truly has a small radiated bladder. Having said that he was doing well prior to the bladder neck procedure   Today  Frequency stable. Incontinence stable.  No change in incontinence on double therapy. I drew a picture went to the artificial sphincter in detail. I detailed complication of recurrent bladder neck contracture or calcification or foreign body with potential sequelae with sphincter in situ. He has good hand dexterity but is quite obese in terms of cycling the device discussed. Handout given. He will call me feel like to proceed post virus. He would like to try to get name of the patient and I will try to send him one     ALLERGIES: Benazepril    MEDICATIONS: Oxybutynin Chloride Er 10 mg tablet, extended release 24 hr 1 tablet PO Daily  Amlodipine Besylate 5 mg tablet  Atorvastatin Calcium  Latanoprost 0.005 % drops Ophthalmic  Losartan Potassium 50 mg tablet  Onglyza 2.5 mg tablet Oral  Timoptic 0.25 % drops Ophthalmic     GU PSH: Complex cystometrogram, w/ void pressure and urethral pressure profile studies, any technique - 12/26/2018 Complex Uroflow - 12/26/2018 Cysto Remove Stent FB Com - 06/11/2018 Cystoscopy - 01/03/2019, 09/05/2018, 07/04/2018, 05/09/2018 Emg surf Electrd - 12/26/2018 Inject For cystogram - 12/26/2018 Intrabd voidng Press - 12/26/2018 Locm 300-399Mg /Ml Iodine,1Ml - 05/07/2018, 05/29/2017 Robotic Radical Prostatectomy - 2009       PSH Notes: Prostatect Retropubic Radical W/ Nerve Sparing Laparoscopic   Right foot repair  Cardiac Catheterization without stent placement   NON-GU PSH: No Non-GU PSH    GU PMH: Mixed incontinence - 12/26/2018 Incontinence w/o Sensation - 11/28/2018 Stress Incontinence - 10/23/2018, - 10/16/2018, Male stress incontinence, - 2017 Gross hematuria - 05/22/2018 Urinary Retention - 05/03/2018 Prostate Cancer, Prostate cancer - 2017 History of urolithiasis, Nephrolithiasis - 2014      PMH Notes:   1) Prostate  cancer: He is s/p a non nerve-sparing RLRP on August 22, 2005. He developed a biochemical recurrence in November 2014. He eventually proceeded with salvage radiation therapy under the care of Dr. Tammi Klippel in November-December 2015. His PSA continued to increase following radiation indicating systemic disease.   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 He has not had a recurrence since dilation.   Jul 2007: Radical prostatectomy  Nov-Dec 2015: Salvage radiation therapy: 68.4 Gy (Dr. Tammi Klippel), PSA 0.51 prior to radiation  May 2019: CT scan - Negative, Bone scan - Negative, PSA 5.31   2) Urinary retention/incontinence: He developed urinary retention in the spring of 2020 and required catheterization. After failing voiding trials, cystoscopy revealed a bladder neck calculus. This was removed in May 2020 along with an underlying hemolock clip. His did develop worsening incontinence postoperatively.   Aug 2020: Restarted PT  NON-GU PMH: Pyuria/other UA findings - 05/09/2018 Gout, Gout - 2014 Diabetes Type 2 GERD Glaucoma Hypertension    FAMILY HISTORY: nephrolithiasis - Mother   SOCIAL HISTORY: Marital Status: Married Preferred Language: English; Ethnicity: Not Hispanic Or Latino; Race: White Current Smoking Status: Patient does not smoke anymore. Has not smoked since 10/24/1980.  Social Drinker.  Drinks 1 caffeinated drink per day.     Notes: Never A Smoker, Alcohol Use, Occupation:   REVIEW OF SYSTEMS:    GU Review Male:   Patient denies frequent urination, hard to postpone urination, burning/ pain with urination, get up at night to urinate, leakage of urine, stream starts and stops, trouble starting your stream, have to strain to urinate , erection problems, and penile pain.  Gastrointestinal (Upper):   Patient denies nausea, vomiting, and  indigestion/ heartburn.  Gastrointestinal (Lower):   Patient denies diarrhea and constipation.  Constitutional:   Patient denies fever, night sweats, weight loss, and fatigue.  Skin:   Patient denies skin rash/ lesion and itching.  Eyes:   Patient denies blurred vision and double vision.  Ears/ Nose/ Throat:   Patient denies sore throat and sinus problems.  Hematologic/Lymphatic:   Patient denies swollen glands and easy bruising.  Cardiovascular:   Patient denies chest pains and leg swelling.  Respiratory:   Patient denies cough and shortness of breath.  Endocrine:   Patient denies excessive thirst.  Musculoskeletal:   Patient denies back pain and joint pain.  Neurological:   Patient denies headaches and dizziness.  Psychologic:   Patient denies depression and anxiety.   VITAL SIGNS:      02/12/2019 02:27 PM  Weight 230 lb / 104.33 kg  BP 119/71 mmHg  Pulse 84 /min  Temperature 97.5 F / 36.3 C   PAST DATA REVIEWED:  Source Of History:  Patient   02/06/19 11/01/18 08/29/18 04/20/18 03/16/18 05/10/17 10/26/16 05/04/16  PSA  Total PSA 12.70 ng/mL 8.23 ng/mL 9.01 ng/mL 9.03 ng/mL 10.20 ng/mL 5.31 ng/mL 3.53 ng/mL 2.31 ng/dl    PROCEDURES: None   ASSESSMENT:      ICD-10 Details  1 GU:   Incontinence w/o Sensation - N39.42   2   Mixed incontinence - N39.46               Notes:   I drew him a picture and we talked about an artificial sphincter in detail. Pros, cons, general surgical and anesthetic risks, and other options including behavioral therapy, the male sling, and watchful waiting were discussed. He understands that sphincters are successful in 80-90% of cases for stress incontinence (dry in approximately ), 50% for urge incontinence, and that in a small % of cases the incontinence can worsen. Surgical risks were described but not limited to the discussion of injury to neighboring structures including the bowel (with possible life-threatening sepsis and colostomy), and bladder,  urethra (all resulting in further surgery). The risk of urethral erosion and infection were discussed with sequelae. The risks of malfunction, atrophy, and hernia/skin erosion and management were discussed. Bleeding risks with transfusion rates and risks of perineal numbness and erectile dysfunction were discussed. The risks of urinary retention requiring catheterization and slowing of urinary stream were discussed. We talked about injury to nerves/soft tissue leading to debilitating and intractable pelvic, abdominal, and lower extremity pain syndromes and neuropathies. The usual post-operative course and time of activation was described. The patient understands that he might not reach his treatment goal and that he might be worse following surgery.  After a thorough review of the management options for the patient's condition the patient  elected to proceed with surgical therapy as noted above. We have discussed the potential benefits and risks of the procedure, side effects of the proposed treatment, the likelihood of the patient achieving the goals of the procedure, and any potential problems that might occur during the procedure or recuperation. Informed consent has been obtained.

## 2019-05-08 DIAGNOSIS — H353131 Nonexudative age-related macular degeneration, bilateral, early dry stage: Secondary | ICD-10-CM | POA: Diagnosis not present

## 2019-05-08 DIAGNOSIS — H2000 Unspecified acute and subacute iridocyclitis: Secondary | ICD-10-CM | POA: Diagnosis not present

## 2019-05-08 DIAGNOSIS — H35352 Cystoid macular degeneration, left eye: Secondary | ICD-10-CM | POA: Diagnosis not present

## 2019-05-10 DIAGNOSIS — H353131 Nonexudative age-related macular degeneration, bilateral, early dry stage: Secondary | ICD-10-CM | POA: Diagnosis not present

## 2019-05-10 DIAGNOSIS — H35352 Cystoid macular degeneration, left eye: Secondary | ICD-10-CM | POA: Diagnosis not present

## 2019-05-10 DIAGNOSIS — H209 Unspecified iridocyclitis: Secondary | ICD-10-CM | POA: Diagnosis not present

## 2019-05-10 NOTE — Patient Instructions (Addendum)
DUE TO COVID-19 ONLY ONE VISITOR IS ALLOWED TO COME WITH YOU AND STAY IN THE WAITING ROOM ONLY DURING PRE OP AND PROCEDURE DAY OF SURGERY. THE 2 VISITORS MAY VISIT WITH YOU AFTER SURGERY IN YOUR PRIVATE ROOM DURING VISITING HOURS ONLY!  YOU NEED TO HAVE A COVID 19 TEST ON__4/23_____ @__10 :05_____, THIS TEST MUST BE DONE BEFORE SURGERY, COME  [address], [place] , [address].  ([place]) ONCE YOUR COVID TEST IS COMPLETED, PLEASE BEGIN THE QUARANTINE INSTRUCTIONS AS OUTLINED IN YOUR HANDOUT.                [place]   Your procedure is scheduled on: 05/21/19   Report to [facility] Main  Entrance   Report to Short Stay at 5:30 AM     Call this number if you have problems the morning of surgery [phone]    Remember: Do not eat food or drink liquids :After Midnight.   BRUSH YOUR TEETH MORNING OF SURGERY AND RINSE YOUR MOUTH OUT, NO CHEWING GUM CANDY OR MINTS.     Take these medicines the morning of surgery with A SIP OF WATER: Amlodipine . Use your eye drops  DO NOT TAKE ANY DIABETIC MEDICATIONS DAY OF YOUR SURGERY    How to Manage Your Diabetes Before and After Surgery  Why is it important to control my blood sugar before and after surgery? . Improving blood sugar levels before and after surgery helps healing and can limit problems. . A way of improving blood sugar control is eating a healthy diet by: o  Eating less sugar and carbohydrates o  Increasing activity/exercise o  Talking with your doctor about reaching your blood sugar goals . High blood sugars (greater than 180 mg/dL) can raise your risk of infections and slow your recovery, so you will need to focus on controlling your diabetes during the weeks before surgery. . Make sure that the doctor who takes care of your diabetes knows about your planned surgery including the date and location.  How do I manage my blood sugar before surgery? . Check your blood sugar at least 4  times a day, starting 2 days before surgery, to make sure that the level is not too high or low. o Check your blood sugar the morning of your surgery when you wake up and every 2 hours until you get to the Short Stay unit. . If your blood sugar is less than 70 mg/dL, you will need to treat for low blood sugar: o Do not take insulin. o Treat a low blood sugar (less than 70 mg/dL) with  cup of clear juice (cranberry or apple), 4 glucose tablets, OR glucose gel. o Recheck blood sugar in 15 minutes after treatment (to make sure it is greater than 70 mg/dL). If your blood sugar is not greater than 70 mg/dL on recheck, call [phone] for further instructions. . Report your blood sugar to the short stay nurse when you get to Short Stay.  . If you are admitted to the hospital after surgery: o Your blood sugar will be checked by the staff and you will probably be given insulin after surgery (instead of oral diabetes medicines) to make sure you have good blood sugar levels. o The goal for blood sugar control after surgery is 80-180 mg/dL.   WHAT DO I DO ABOUT MY DIABETES MEDICATION?  Marland Kitchen Do not take oral diabetes medicines (pills) the morning of surgery.  You may not have any metal on your body including               piercings  Do not wear jewelry,  lotions, powders or  deodorant                         Men may shave face and neck.   Do not bring valuables to the hospital. [place].  Contacts, dentures or bridgework may not be worn into surgery. .     Patients discharged the day of surgery will not be allowed to drive home.   IF YOU ARE HAVING SURGERY AND GOING HOME THE SAME DAY, YOU MUST HAVE AN ADULT TO DRIVE YOU HOME AND BE WITH YOU FOR 24 HOURS.   YOU MAY GO HOME BY TAXI OR UBER OR ORTHERWISE, BUT AN ADULT MUST ACCOMPANY YOU HOME AND STAY WITH YOU FOR 24 HOURS.  Name and phone number of your driver:  Special Instructions:  N/A              Please read over the following fact sheets you were given: _____________________________________________________________________  [facility] - Preparing for Surgery Before surgery, you can play an important role.  Because skin is not sterile, your skin needs to be as free of germs as possible.  You can reduce the number of germs on your skin by washing with CHG (chlorahexidine gluconate) soap before surgery.  CHG is an antiseptic cleaner which kills germs and bonds with the skin to continue killing germs even after washing. Please DO NOT use if you have an allergy to CHG or antibacterial soaps.  If your skin becomes reddened/irritated stop using the CHG and inform your nurse when you arrive at Short Stay. Do not shave (including legs and underarms) for at least 48 hours prior to the first CHG shower.  You may shave your face/neck. Please follow these instructions carefully:  1.  Shower with CHG Soap the night before surgery and the  morning of Surgery.  2.  If you choose to wash your hair, wash your hair first as usual with your  normal  shampoo.  3.  After you shampoo, rinse your hair and body thoroughly to remove the  shampoo.                                        4.  Use CHG as you would any other liquid soap.  You can apply chg directly  to the skin and wash                       Gently with a scrungie or clean washcloth.  5.  Apply the CHG Soap to your body ONLY FROM THE NECK DOWN.   Do not use on face/ open                           Wound or open sores. Avoid contact with eyes, ears mouth and genitals (private parts).                       Wash face,  Genitals (private parts) with your normal soap.  6.  Wash thoroughly, paying special attention to the area where your surgery  will be performed.  7.  Thoroughly rinse your body with warm water from the neck down.  8.  DO NOT shower/wash with your normal soap after using and rinsing off  the CHG Soap.                 9.  Pat yourself dry with a clean towel.            10.  Wear clean pajamas.            11.  Place clean sheets on your bed the night of your first shower and do not  sleep with pets. Day of Surgery : Do not apply any lotions/deodorants the morning of surgery.  Please wear clean clothes to the hospital/surgery center.  FAILURE TO FOLLOW THESE INSTRUCTIONS MAY RESULT IN THE CANCELLATION OF YOUR SURGERY PATIENT SIGNATURE_________________________________  NURSE SIGNATURE__________________________________  ________________________________________________________________________

## 2019-05-13 ENCOUNTER — Encounter (HOSPITAL_COMMUNITY)
Admission: RE | Admit: 2019-05-13 | Discharge: 2019-05-13 | Disposition: A | Discharge status: Home / Self Care | Payer: Medicare Other | Source: Ambulatory Visit | Attending: Family Medicine | Admitting: Family Medicine

## 2019-05-13 ENCOUNTER — Encounter (HOSPITAL_COMMUNITY): Payer: Self-pay

## 2019-05-13 ENCOUNTER — Telehealth: Payer: Self-pay | Admitting: Internal Medicine

## 2019-05-13 ENCOUNTER — Other Ambulatory Visit: Payer: Self-pay

## 2019-05-13 DIAGNOSIS — Z01818 Encounter for other preprocedural examination: Secondary | ICD-10-CM | POA: Diagnosis not present

## 2019-05-13 DIAGNOSIS — I10 Essential (primary) hypertension: Secondary | ICD-10-CM | POA: Insufficient documentation

## 2019-05-13 DIAGNOSIS — N390 Urinary tract infection, site not specified: Secondary | ICD-10-CM | POA: Diagnosis not present

## 2019-05-13 HISTORY — DX: Personal history of urinary calculi: Z87.442

## 2019-05-13 LAB — BASIC METABOLIC PANEL
Anion gap: 9 (ref 5–15)
BUN: 22 mg/dL (ref 8–23)
CO2: 24 mmol/L (ref 22–32)
Calcium: 9.5 mg/dL (ref 8.9–10.3)
Chloride: 107 mmol/L (ref 98–111)
Creatinine, Ser: 1.59 mg/dL — ABNORMAL HIGH (ref 0.61–1.24)
GFR calc Af Amer: 47 mL/min — ABNORMAL LOW (ref >60)
GFR calc non Af Amer: 41 mL/min — ABNORMAL LOW (ref >60)
Glucose, Bld: 152 mg/dL — ABNORMAL HIGH (ref 70–99)
Potassium: 4.4 mmol/L (ref 3.5–5.1)
Sodium: 140 mmol/L (ref 135–145)

## 2019-05-13 LAB — CBC
HCT: 41.8 % (ref 39.0–52.0)
Hemoglobin: 13.7 g/dL (ref 13.0–17.0)
MCH: 32.5 pg (ref 26.0–34.0)
MCHC: 32.8 g/dL (ref 30.0–36.0)
MCV: 99.3 fL (ref 80.0–100.0)
Platelets: 248 10*3/uL (ref 150–400)
RBC: 4.21 MIL/uL — ABNORMAL LOW (ref 4.22–5.81)
RDW: 13.3 % (ref 11.5–15.5)
WBC: 7.4 10*3/uL (ref 4.0–10.5)
nRBC: 0 % (ref 0.0–0.2)

## 2019-05-13 LAB — GLUCOSE, CAPILLARY: Glucose-Capillary: 136 mg/dL — ABNORMAL HIGH (ref 70–99)

## 2019-05-13 LAB — HEMOGLOBIN A1C
Hgb A1c MFr Bld: 6.4 % — ABNORMAL HIGH (ref 4.8–5.6)
Mean Plasma Glucose: 136.98 mg/dL

## 2019-05-13 NOTE — Telephone Encounter (Signed)
Pt is requesting labs done. Pt is having eye problem and was told last time he was in the office to have labs done every year. Thanks

## 2019-05-13 NOTE — Progress Notes (Signed)
PCP - Dr. Judieth Keens Cardiologist - no  Chest x-ray - 04/02/18 EKG - 05/13/19 Stress Test - 03/29/18 ECHO - 04/02/18 Cardiac Cath - 03/19/18  Sleep Study - no CPAP - no  Fasting Blood Sugar - 120-130 Checks Blood Sugar _____ times a day 1/mo.  Blood Thinner Instructions:NA Aspirin Instructions: Last Dose:  Anesthesia review:   Patient denies shortness of breath, fever, cough and chest pain at PAT appointment yes  Patient verbalized understanding of instructions that were given to them at the PAT appointment. Patient was also instructed that they will need to review over the PAT instructions again at home before surgery. yes

## 2019-05-14 NOTE — Telephone Encounter (Signed)
Spoke with patient and a virtual visit scheduled  

## 2019-05-16 ENCOUNTER — Other Ambulatory Visit: Payer: Self-pay

## 2019-05-16 ENCOUNTER — Telehealth (INDEPENDENT_AMBULATORY_CARE_PROVIDER_SITE_OTHER): Payer: Medicare Other | Admitting: Internal Medicine

## 2019-05-16 ENCOUNTER — Ambulatory Visit (INDEPENDENT_AMBULATORY_CARE_PROVIDER_SITE_OTHER): Payer: Medicare Other

## 2019-05-16 DIAGNOSIS — H209 Unspecified iridocyclitis: Secondary | ICD-10-CM

## 2019-05-16 DIAGNOSIS — M459 Ankylosing spondylitis of unspecified sites in spine: Secondary | ICD-10-CM | POA: Diagnosis not present

## 2019-05-16 LAB — COMPREHENSIVE METABOLIC PANEL
ALT: 18 U/L (ref 0–53)
AST: 15 U/L (ref 0–37)
Albumin: 4.4 g/dL (ref 3.5–5.2)
Alkaline Phosphatase: 95 U/L (ref 39–117)
BUN: 25 mg/dL — ABNORMAL HIGH (ref 6–23)
CO2: 27 mEq/L (ref 19–32)
Calcium: 9.2 mg/dL (ref 8.4–10.5)
Chloride: 106 mEq/L (ref 96–112)
Creatinine, Ser: 1.65 mg/dL — ABNORMAL HIGH (ref 0.40–1.50)
GFR: 40.45 mL/min — ABNORMAL LOW (ref >60.00)
Glucose, Bld: 119 mg/dL — ABNORMAL HIGH (ref 70–99)
Potassium: 4.6 mEq/L (ref 3.5–5.1)
Sodium: 140 mEq/L (ref 135–145)
Total Bilirubin: 0.7 mg/dL (ref 0.2–1.2)
Total Protein: 6.8 g/dL (ref 6.0–8.3)

## 2019-05-16 LAB — CBC WITH DIFFERENTIAL/PLATELET
Basophils Absolute: 0 10*3/uL (ref 0.0–0.1)
Basophils Relative: 0.5 % (ref 0.0–3.0)
Eosinophils Absolute: 0.1 10*3/uL (ref 0.0–0.7)
Eosinophils Relative: 1.4 % (ref 0.0–5.0)
HCT: 38.7 % — ABNORMAL LOW (ref 39.0–52.0)
Hemoglobin: 13 g/dL (ref 13.0–17.0)
Lymphocytes Relative: 10.9 % — ABNORMAL LOW (ref 12.0–46.0)
Lymphs Abs: 0.8 10*3/uL (ref 0.7–4.0)
MCHC: 33.5 g/dL (ref 30.0–36.0)
MCV: 97.6 fl (ref 78.0–100.0)
Monocytes Absolute: 0.6 10*3/uL (ref 0.1–1.0)
Monocytes Relative: 8.1 % (ref 3.0–12.0)
Neutro Abs: 6.1 10*3/uL (ref 1.4–7.7)
Neutrophils Relative %: 79.1 % — ABNORMAL HIGH (ref 43.0–77.0)
Platelets: 242 10*3/uL (ref 150.0–400.0)
RBC: 3.97 Mil/uL — ABNORMAL LOW (ref 4.22–5.81)
RDW: 13.7 % (ref 11.5–15.5)
WBC: 7.7 10*3/uL (ref 4.0–10.5)

## 2019-05-16 NOTE — Addendum Note (Signed)
Addended by: Erline Hau on: 05/16/2019 10:49 AM   Modules accepted: Orders

## 2019-05-16 NOTE — Addendum Note (Signed)
Addended by: Suzette Battiest on: 05/16/2019 10:55 AM   Modules accepted: Orders

## 2019-05-16 NOTE — Progress Notes (Signed)
Virtual Visit via Telephone Note  I connected with [name] on 05/16/19 at 10:00 AM EDT by telephone and verified that I am speaking with the correct person using two identifiers.   I discussed the limitations, risks, security and privacy concerns of performing an evaluation and management service by telephone and the availability of in person appointments. I also discussed with the patient that there may be a patient responsible charge related to this service. The patient expressed understanding and agreed to proceed.  Location patient: home Location provider: work office Participants present for the call: patient, provider Patient did not have a visit in the prior 7 days to address this/these issue(s).   History of Present Illness:  He has scheduled this telephone visit to discuss some findings after his ophthalmology appointment.  He has been diagnosed with ankylosing spondylitis (Reiter's syndrome).  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  He was referred out to a retina specialist who is requesting certain labs and x-rays to be drawn.  He was provided with a written order for these and was wondering if he could have them done in our office.   Observations/Objective: Patient sounds cheerful and well on the phone. I do not appreciate any increased work of breathing. Speech and thought processing are grossly intact. Patient reported vitals: None reported   Current Outpatient Medications:  .  amLODipine (NORVASC) 5 MG tablet, TAKE 1 TABLET BY MOUTH TWICE DAILY (MEDICATION  INCREASED) (Patient taking differently: Take 5 mg by mouth in the morning and at bedtime. ), Disp: 60 tablet, Rfl: 0 .  atorvastatin (LIPITOR) 20 MG tablet, TAKE 1 TABLET BY MOUTH ONCE DAILY . APPOINTMENT REQUIRED FOR FUTURE REFILLS (Patient taking differently: Take 20 mg by mouth at bedtime. ), Disp: 30 tablet, Rfl: 0 .  dorzolamide-timolol  (COSOPT) 22.3-6.8 MG/ML ophthalmic solution, Place 1 drop into both eyes See admin instructions. 1 drop into left eye twice daily & 1 drop into right eye in the morning, Disp: , Rfl:  .  glipiZIDE (GLUCOTROL) 5 MG tablet, TAKE 1 TABLET BY MOUTH ONCE DAILY BEFORE BREAKFAST (Patient taking differently: Take 5 mg by mouth daily before breakfast. ), Disp: 30 tablet, Rfl: 0 .  glucose blood (ACCU-CHEK AVIVA) test strip, 1 each by Other route 2 (two) times daily., Disp: 100 each, Rfl: 3 .  latanoprost (XALATAN) 0.005 % ophthalmic solution, Place 1 drop into both eyes at bedtime. , Disp: , Rfl:  .  losartan (COZAAR) 50 MG tablet, Take 1 tablet (50 mg total) by mouth daily. (Patient taking differently: Take 50 mg by mouth every evening. ), Disp: 90 tablet, Rfl: 1  Review of Systems:  Constitutional: Denies fever, chills, diaphoresis, appetite change and fatigue.  HEENT: Denies photophobia, eye pain, redness, hearing loss, ear pain, congestion, sore throat, rhinorrhea, sneezing, mouth sores, trouble swallowing, neck pain, neck stiffness and tinnitus.   Respiratory: Denies SOB, DOE, cough, chest tightness,  and wheezing.   Cardiovascular: Denies chest pain, palpitations and leg swelling.  Gastrointestinal: Denies nausea, vomiting, abdominal pain, diarrhea, constipation, blood in stool and abdominal distention.  Genitourinary: Denies dysuria, urgency, frequency, hematuria, flank pain and difficulty urinating.  Endocrine: Denies: hot or cold intolerance, sweats, changes in hair or nails, polyuria, polydipsia. Musculoskeletal: Denies myalgias, back pain, joint swelling, arthralgias and gait problem.  Skin: Denies pallor, rash and wound.  Neurological: Denies dizziness, seizures, syncope, weakness, light-headedness, numbness and headaches.  Hematological: Denies adenopathy. Easy bruising, personal or family bleeding history  Psychiatric/Behavioral: Denies suicidal ideation, mood changes, confusion, nervousness,  sleep disturbance and agitation   Assessment and Plan:  Uveitis of left eye  Ankylosing spondylitis, unspecified site of spine ([place])  -He will come in office today and will bring order from ophthalmologist to see if these are labs and x-rays that can be done at our office.    I discussed the assessment and treatment plan with the patient. The patient was provided an opportunity to ask questions and all were answered. The patient agreed with the plan and demonstrated an understanding of the instructions.   The patient was advised to call back or seek an in-person evaluation if the symptoms worsen or if the condition fails to improve as anticipated.  I provided 21 minutes of non-face-to-face time during this encounter.   Lelon Frohlich, MD [facility]

## 2019-05-17 ENCOUNTER — Other Ambulatory Visit (HOSPITAL_COMMUNITY)
Admission: RE | Admit: 2019-05-17 | Discharge: 2019-05-17 | Disposition: A | Discharge status: Home / Self Care | Payer: Medicare Other | Source: Ambulatory Visit | Attending: Urology | Admitting: Urology

## 2019-05-17 DIAGNOSIS — Z01812 Encounter for preprocedural laboratory examination: Secondary | ICD-10-CM | POA: Diagnosis not present

## 2019-05-17 DIAGNOSIS — Z20822 Contact with and (suspected) exposure to covid-19: Secondary | ICD-10-CM | POA: Diagnosis not present

## 2019-05-17 LAB — HLA-B27 ANTIGEN: HLA-B27 Antigen: POSITIVE — AB

## 2019-05-17 LAB — SARS CORONAVIRUS 2 (TAT 6-24 HRS): SARS Coronavirus 2: NEGATIVE

## 2019-05-17 LAB — RPR: RPR Ser Ql: NONREACTIVE

## 2019-05-20 MED ORDER — GENTAMICIN SULFATE 40 MG/ML IJ SOLN
5.0000 mg/kg | INTRAVENOUS | Status: AC
  Administered 2019-05-21: 08:00:00 410 mg via INTRAVENOUS
  Filled 2019-05-20: qty 10.25

## 2019-05-20 NOTE — Anesthesia Preprocedure Evaluation (Addendum)
Anesthesia Evaluation  Patient identified by MRN, date of birth, ID band Patient awake    Reviewed: Allergy & Precautions, NPO status , Patient's Chart, lab work & pertinent test results  History of Anesthesia Complications Negative for: history of anesthetic complications  Airway Mallampati: II  TM Distance: >3 FB Neck ROM: Full    Dental  (+) Edentulous Upper, Dental Advisory Given   Pulmonary neg pulmonary ROS, former smoker,    Pulmonary exam normal        Cardiovascular hypertension, Pt. on medications Normal cardiovascular exam     Neuro/Psych negative neurological ROS  negative psych ROS   GI/Hepatic Neg liver ROS, GERD  ,  Endo/Other  diabetesHypothyroidism   Renal/GU Renal InsufficiencyRenal disease  negative genitourinary   Musculoskeletal negative musculoskeletal ROS (+)   Abdominal   Peds negative pediatric ROS (+)  Hematology negative hematology ROS (+)   Anesthesia Other Findings   Reproductive/Obstetrics negative OB ROS                            Anesthesia Physical  Anesthesia Plan  ASA: III  Anesthesia Plan: General   Post-op Pain Management:    Induction: Intravenous  PONV Risk Score and Plan: 3 and Ondansetron, Treatment may vary due to age or medical condition, Midazolam and Dexamethasone  Airway Management Planned: Oral ETT  Additional Equipment:   Intra-op Plan:   Post-operative Plan: Extubation in OR  Informed Consent: I have reviewed the patients History and Physical, chart, labs and discussed the procedure including the risks, benefits and alternatives for the proposed anesthesia with the patient or authorized representative who has indicated his/her understanding and acceptance.     Dental advisory given  Plan Discussed with: Anesthesiologist and CRNA  Anesthesia Plan Comments:       Anesthesia Quick Evaluation

## 2019-05-21 ENCOUNTER — Ambulatory Visit (HOSPITAL_COMMUNITY): Payer: Medicare Other | Admitting: Anesthesiology

## 2019-05-21 ENCOUNTER — Encounter (HOSPITAL_COMMUNITY): Payer: Self-pay | Admitting: Urology

## 2019-05-21 ENCOUNTER — Other Ambulatory Visit: Payer: Self-pay

## 2019-05-21 ENCOUNTER — Encounter (HOSPITAL_COMMUNITY)
Admission: RE | Disposition: A | Discharge status: Home / Self Care | Payer: Self-pay | Source: Home / Self Care | Attending: Urology

## 2019-05-21 ENCOUNTER — Observation Stay (HOSPITAL_COMMUNITY)
Admission: RE | Admit: 2019-05-21 | Discharge: 2019-05-22 | Disposition: A | Discharge status: Home / Self Care | Payer: Medicare Other | Attending: Urology | Admitting: Urology

## 2019-05-21 ENCOUNTER — Ambulatory Visit (HOSPITAL_COMMUNITY): Payer: Medicare Other | Admitting: Physician Assistant

## 2019-05-21 DIAGNOSIS — N3946 Mixed incontinence: Secondary | ICD-10-CM | POA: Diagnosis not present

## 2019-05-21 DIAGNOSIS — E119 Type 2 diabetes mellitus without complications: Secondary | ICD-10-CM | POA: Insufficient documentation

## 2019-05-21 DIAGNOSIS — Z7984 Long term (current) use of oral hypoglycemic drugs: Secondary | ICD-10-CM | POA: Insufficient documentation

## 2019-05-21 DIAGNOSIS — Z923 Personal history of irradiation: Secondary | ICD-10-CM | POA: Insufficient documentation

## 2019-05-21 DIAGNOSIS — Z6834 Body mass index (BMI) 34.0-34.9, adult: Secondary | ICD-10-CM | POA: Insufficient documentation

## 2019-05-21 DIAGNOSIS — M109 Gout, unspecified: Secondary | ICD-10-CM | POA: Diagnosis not present

## 2019-05-21 DIAGNOSIS — Z79899 Other long term (current) drug therapy: Secondary | ICD-10-CM | POA: Insufficient documentation

## 2019-05-21 DIAGNOSIS — E039 Hypothyroidism, unspecified: Secondary | ICD-10-CM | POA: Insufficient documentation

## 2019-05-21 DIAGNOSIS — Z8546 Personal history of malignant neoplasm of prostate: Secondary | ICD-10-CM | POA: Insufficient documentation

## 2019-05-21 DIAGNOSIS — N393 Stress incontinence (female) (male): Secondary | ICD-10-CM | POA: Diagnosis present

## 2019-05-21 DIAGNOSIS — K219 Gastro-esophageal reflux disease without esophagitis: Secondary | ICD-10-CM | POA: Insufficient documentation

## 2019-05-21 DIAGNOSIS — E669 Obesity, unspecified: Secondary | ICD-10-CM | POA: Insufficient documentation

## 2019-05-21 DIAGNOSIS — Z87891 Personal history of nicotine dependence: Secondary | ICD-10-CM | POA: Diagnosis not present

## 2019-05-21 DIAGNOSIS — I10 Essential (primary) hypertension: Secondary | ICD-10-CM | POA: Insufficient documentation

## 2019-05-21 DIAGNOSIS — E785 Hyperlipidemia, unspecified: Secondary | ICD-10-CM | POA: Diagnosis not present

## 2019-05-21 DIAGNOSIS — N320 Bladder-neck obstruction: Secondary | ICD-10-CM | POA: Diagnosis not present

## 2019-05-21 DIAGNOSIS — N200 Calculus of kidney: Secondary | ICD-10-CM | POA: Diagnosis not present

## 2019-05-21 HISTORY — PX: URINARY SPHINCTER IMPLANT: SHX2624

## 2019-05-21 LAB — GLUCOSE, CAPILLARY
Glucose-Capillary: 124 mg/dL — ABNORMAL HIGH (ref 70–99)
Glucose-Capillary: 109 mg/dL — ABNORMAL HIGH (ref 70–99)
Glucose-Capillary: 301 mg/dL — ABNORMAL HIGH (ref 70–99)
Glucose-Capillary: 323 mg/dL — ABNORMAL HIGH (ref 70–99)

## 2019-05-21 LAB — HEMOGLOBIN AND HEMATOCRIT, BLOOD
HCT: 39.1 % (ref 39.0–52.0)
Hemoglobin: 12.5 g/dL — ABNORMAL LOW (ref 13.0–17.0)

## 2019-05-21 SURGERY — INSERTION, ARTIFICIAL URINARY SPHINCTER
Anesthesia: General

## 2019-05-21 MED ORDER — DORZOLAMIDE HCL-TIMOLOL MAL 2-0.5 % OP SOLN
1.0000 [drp] | Freq: Two times a day (BID) | OPHTHALMIC | Status: DC
  Administered 2019-05-21: 1 [drp] via OPHTHALMIC
  Filled 2019-05-21: qty 10

## 2019-05-21 MED ORDER — CELECOXIB 200 MG PO CAPS
ORAL_CAPSULE | ORAL | Status: AC
  Administered 2019-05-21: 06:00:00 200 mg via ORAL
  Filled 2019-05-21: qty 1

## 2019-05-21 MED ORDER — PROPOFOL 10 MG/ML IV BOLUS
INTRAVENOUS | Status: DC | PRN
  Administered 2019-05-21: 170 mg via INTRAVENOUS

## 2019-05-21 MED ORDER — ROCURONIUM BROMIDE 100 MG/10ML IV SOLN
INTRAVENOUS | Status: DC | PRN
  Administered 2019-05-21: 70 mg via INTRAVENOUS

## 2019-05-21 MED ORDER — INSULIN ASPART 100 UNIT/ML ~~LOC~~ SOLN
0.0000 [IU] | Freq: Three times a day (TID) | SUBCUTANEOUS | Status: DC
  Administered 2019-05-21: 18:00:00 11 [IU] via SUBCUTANEOUS

## 2019-05-21 MED ORDER — HYDROCODONE-ACETAMINOPHEN 5-325 MG PO TABS: 1.0000 | ORAL_TABLET | Freq: Four times a day (QID) | ORAL | 0 refills | Status: DC | PRN

## 2019-05-21 MED ORDER — FENTANYL CITRATE (PF) 100 MCG/2ML IJ SOLN: 25.0000 ug | INTRAMUSCULAR | Status: DC | PRN

## 2019-05-21 MED ORDER — ACETAMINOPHEN 325 MG PO TABS: 650.0000 mg | ORAL_TABLET | ORAL | Status: DC | PRN

## 2019-05-21 MED ORDER — EPHEDRINE SULFATE 50 MG/ML IJ SOLN
INTRAMUSCULAR | Status: DC | PRN
  Administered 2019-05-21: 5 mg via INTRAVENOUS
  Administered 2019-05-21: 10 mg via INTRAVENOUS

## 2019-05-21 MED ORDER — PHENYLEPHRINE HCL (PRESSORS) 10 MG/ML IV SOLN
INTRAVENOUS | Status: DC | PRN
  Administered 2019-05-21 (×5): 80 ug via INTRAVENOUS

## 2019-05-21 MED ORDER — BACITRACIN-NEOMYCIN-POLYMYXIN 400-5-5000 EX OINT: 1.0000 "application " | TOPICAL_OINTMENT | Freq: Three times a day (TID) | CUTANEOUS | Status: DC | PRN

## 2019-05-21 MED ORDER — CEFAZOLIN SODIUM-DEXTROSE 2-4 GM/100ML-% IV SOLN
INTRAVENOUS | Status: AC
  Filled 2019-05-21: qty 100

## 2019-05-21 MED ORDER — FENTANYL CITRATE (PF) 250 MCG/5ML IJ SOLN
INTRAMUSCULAR | Status: AC
  Filled 2019-05-21: qty 5

## 2019-05-21 MED ORDER — FENTANYL CITRATE (PF) 250 MCG/5ML IJ SOLN
INTRAMUSCULAR | Status: DC | PRN
  Administered 2019-05-21: 100 ug via INTRAVENOUS

## 2019-05-21 MED ORDER — LIDOCAINE 2% (20 MG/ML) 5 ML SYRINGE
INTRAMUSCULAR | Status: AC
  Filled 2019-05-21: qty 5

## 2019-05-21 MED ORDER — DIPHENHYDRAMINE HCL 12.5 MG/5ML PO ELIX: 12.5000 mg | ORAL_SOLUTION | Freq: Four times a day (QID) | ORAL | Status: DC | PRN

## 2019-05-21 MED ORDER — OXYBUTYNIN CHLORIDE 5 MG PO TABS: 5.0000 mg | ORAL_TABLET | Freq: Three times a day (TID) | ORAL | Status: DC | PRN

## 2019-05-21 MED ORDER — SUGAMMADEX SODIUM 200 MG/2ML IV SOLN
INTRAVENOUS | Status: DC | PRN
Start: 2019-05-21 — End: 2019-05-21
  Administered 2019-05-21: 200 mg via INTRAVENOUS

## 2019-05-21 MED ORDER — CELECOXIB 200 MG PO CAPS: 200.0000 mg | ORAL_CAPSULE | Freq: Once | ORAL | Status: AC

## 2019-05-21 MED ORDER — HYDROCODONE-ACETAMINOPHEN 5-325 MG PO TABS: 1.0000 | ORAL_TABLET | ORAL | Status: DC | PRN

## 2019-05-21 MED ORDER — SODIUM CHLORIDE 0.9 % IV SOLN
INTRAVENOUS | Status: DC | PRN
  Administered 2019-05-21: 09:00:00 500 mL

## 2019-05-21 MED ORDER — DOCUSATE SODIUM 100 MG PO CAPS
100.0000 mg | ORAL_CAPSULE | Freq: Two times a day (BID) | ORAL | Status: DC
  Administered 2019-05-21 – 2019-05-22 (×2): 100 mg via ORAL
  Filled 2019-05-21 (×2): qty 1

## 2019-05-21 MED ORDER — LACTATED RINGERS IV SOLN: INTRAVENOUS | Status: DC

## 2019-05-21 MED ORDER — LIDOCAINE HCL (CARDIAC) PF 100 MG/5ML IV SOSY
PREFILLED_SYRINGE | INTRAVENOUS | Status: DC | PRN
  Administered 2019-05-21: 60 mg via INTRAVENOUS

## 2019-05-21 MED ORDER — DORZOLAMIDE HCL-TIMOLOL MAL 2-0.5 % OP SOLN: 1.0000 [drp] | OPHTHALMIC | Status: DC

## 2019-05-21 MED ORDER — INSULIN ASPART 100 UNIT/ML ~~LOC~~ SOLN
0.0000 [IU] | Freq: Three times a day (TID) | SUBCUTANEOUS | Status: DC
  Administered 2019-05-21: 11 [IU] via SUBCUTANEOUS
  Administered 2019-05-22 (×2): 5 [IU] via SUBCUTANEOUS

## 2019-05-21 MED ORDER — ACETAMINOPHEN 500 MG PO TABS
ORAL_TABLET | ORAL | Status: AC
  Administered 2019-05-21: 06:00:00 1000 mg via ORAL
  Filled 2019-05-21: qty 2

## 2019-05-21 MED ORDER — 0.9 % SODIUM CHLORIDE (POUR BTL) OPTIME
TOPICAL | Status: DC | PRN
  Administered 2019-05-21: 09:00:00 1000 mL

## 2019-05-21 MED ORDER — SODIUM CHLORIDE 0.45 % IV SOLN: INTRAVENOUS | Status: DC

## 2019-05-21 MED ORDER — LATANOPROST 0.005 % OP SOLN
1.0000 [drp] | Freq: Every day | OPHTHALMIC | Status: DC
  Filled 2019-05-21: qty 2.5

## 2019-05-21 MED ORDER — AMLODIPINE BESYLATE 5 MG PO TABS
5.0000 mg | ORAL_TABLET | Freq: Two times a day (BID) | ORAL | Status: DC
  Administered 2019-05-21 – 2019-05-22 (×2): 5 mg via ORAL
  Filled 2019-05-21 (×2): qty 1

## 2019-05-21 MED ORDER — MORPHINE SULFATE (PF) 2 MG/ML IV SOLN: 2.0000 mg | INTRAVENOUS | Status: DC | PRN

## 2019-05-21 MED ORDER — DORZOLAMIDE HCL-TIMOLOL MAL 2-0.5 % OP SOLN
1.0000 [drp] | Freq: Every day | OPHTHALMIC | Status: DC
  Filled 2019-05-21: qty 10

## 2019-05-21 MED ORDER — LOSARTAN POTASSIUM 50 MG PO TABS
50.0000 mg | ORAL_TABLET | Freq: Every evening | ORAL | Status: DC
  Administered 2019-05-21: 18:00:00 50 mg via ORAL
  Filled 2019-05-21: qty 1

## 2019-05-21 MED ORDER — DEXAMETHASONE SODIUM PHOSPHATE 10 MG/ML IJ SOLN
INTRAMUSCULAR | Status: AC
  Filled 2019-05-21: qty 1

## 2019-05-21 MED ORDER — ATORVASTATIN CALCIUM 20 MG PO TABS
20.0000 mg | ORAL_TABLET | Freq: Every day | ORAL | Status: DC
  Administered 2019-05-21: 23:00:00 20 mg via ORAL
  Filled 2019-05-21: qty 1

## 2019-05-21 MED ORDER — CHLORHEXIDINE GLUCONATE CLOTH 2 % EX PADS
6.0000 | MEDICATED_PAD | Freq: Every day | CUTANEOUS | Status: DC
  Administered 2019-05-22: 11:00:00 6 via TOPICAL

## 2019-05-21 MED ORDER — CEFAZOLIN SODIUM-DEXTROSE 2-4 GM/100ML-% IV SOLN
2.0000 g | INTRAVENOUS | Status: AC
  Administered 2019-05-21: 08:00:00 2 g via INTRAVENOUS

## 2019-05-21 MED ORDER — ONDANSETRON HCL 4 MG/2ML IJ SOLN
INTRAMUSCULAR | Status: AC
  Filled 2019-05-21: qty 2

## 2019-05-21 MED ORDER — ONDANSETRON HCL 4 MG/2ML IJ SOLN
INTRAMUSCULAR | Status: DC | PRN
  Administered 2019-05-21: 4 mg via INTRAVENOUS

## 2019-05-21 MED ORDER — STERILE WATER FOR IRRIGATION IR SOLN
Status: DC | PRN
  Administered 2019-05-21: 1000 mL

## 2019-05-21 MED ORDER — PROMETHAZINE HCL 25 MG/ML IJ SOLN: 6.2500 mg | INTRAMUSCULAR | Status: DC | PRN

## 2019-05-21 MED ORDER — ROCURONIUM BROMIDE 10 MG/ML (PF) SYRINGE
PREFILLED_SYRINGE | INTRAVENOUS | Status: AC
  Filled 2019-05-21: qty 10

## 2019-05-21 MED ORDER — PROPOFOL 10 MG/ML IV BOLUS
INTRAVENOUS | Status: AC
  Filled 2019-05-21: qty 20

## 2019-05-21 MED ORDER — ONDANSETRON HCL 4 MG/2ML IJ SOLN: 4.0000 mg | INTRAMUSCULAR | Status: DC | PRN

## 2019-05-21 MED ORDER — SODIUM CHLORIDE 0.9 % IV SOLN
INTRAVENOUS | Status: AC
  Filled 2019-05-21: qty 500000

## 2019-05-21 MED ORDER — DEXAMETHASONE SODIUM PHOSPHATE 10 MG/ML IJ SOLN
INTRAMUSCULAR | Status: DC | PRN
Start: 2019-05-21 — End: 2019-05-21
  Administered 2019-05-21: 4 mg via INTRAVENOUS

## 2019-05-21 MED ORDER — DIPHENHYDRAMINE HCL 50 MG/ML IJ SOLN: 12.5000 mg | Freq: Four times a day (QID) | INTRAMUSCULAR | Status: DC | PRN

## 2019-05-21 MED ORDER — ACETAMINOPHEN 500 MG PO TABS: 1000.0000 mg | ORAL_TABLET | Freq: Once | ORAL | Status: AC

## 2019-05-21 MED ORDER — PHENYLEPHRINE HCL (PRESSORS) 10 MG/ML IV SOLN
INTRAVENOUS | Status: AC
  Filled 2019-05-21: qty 1

## 2019-05-21 MED ORDER — PHENYLEPHRINE HCL-NACL 10-0.9 MG/250ML-% IV SOLN
INTRAVENOUS | Status: DC | PRN
  Administered 2019-05-21: 50 ug/min via INTRAVENOUS

## 2019-05-21 MED ORDER — HYDROCODONE-ACETAMINOPHEN 5-325 MG PO TABS
ORAL_TABLET | ORAL | Status: AC
  Administered 2019-05-21: 11:00:00 1 via ORAL
  Filled 2019-05-21: qty 1

## 2019-05-21 SURGICAL SUPPLY — 58 items
ADH SKN CLS APL DERMABOND .7 (GAUZE/BANDAGES/DRESSINGS) ×1
BAG DECANTER FOR FLEXI CONT (MISCELLANEOUS) ×3 IMPLANT
BAG DRN RND TRDRP ANRFLXCHMBR (UROLOGICAL SUPPLIES) ×1
BAG URINE DRAIN 2000ML AR STRL (UROLOGICAL SUPPLIES) ×3 IMPLANT
BALLOON PRESSURE REGUL 61 70CM (Miscellaneous) IMPLANT
BLADE EXTENDED COATED 6.5IN (ELECTRODE) ×2 IMPLANT
BLADE SURG 15 STRL LF DISP TIS (BLADE) ×2 IMPLANT
BLADE SURG 15 STRL SS (BLADE) ×6
BNDG GAUZE ELAST 4 BULKY (GAUZE/BANDAGES/DRESSINGS) ×3 IMPLANT
BRIEF STRETCH FOR OB PAD LRG (UNDERPADS AND DIAPERS) ×3 IMPLANT
CATH FOLEY 2WAY SLVR  5CC 14FR (CATHETERS) ×3
CATH FOLEY 2WAY SLVR 5CC 14FR (CATHETERS) ×1 IMPLANT
CONTROL PUMP (Urological Implant) ×2 IMPLANT
COVER MAYO STAND STRL (DRAPES) ×3 IMPLANT
COVER WAND RF STERILE (DRAPES) IMPLANT
CUFF URINARY OCCL 4. IZ (Miscellaneous) ×2 IMPLANT
DECANTER SPIKE VIAL GLASS SM (MISCELLANEOUS) ×9 IMPLANT
DERMABOND ADVANCED (GAUZE/BANDAGES/DRESSINGS) ×2
DERMABOND ADVANCED .7 DNX12 (GAUZE/BANDAGES/DRESSINGS) ×2 IMPLANT
DISSECTOR ROUND CHERRY 3/8 STR (MISCELLANEOUS) ×3 IMPLANT
DRAPE SHEET LG 3/4 BI-LAMINATE (DRAPES) IMPLANT
DRAPE UNDERBUTTOCKS STRL (DISPOSABLE) ×3 IMPLANT
DRSG TEGADERM 4X4.75 (GAUZE/BANDAGES/DRESSINGS) ×6 IMPLANT
DRSG TELFA 3X8 NADH (GAUZE/BANDAGES/DRESSINGS) ×3 IMPLANT
ELECT PENCIL ROCKER SW 15FT (MISCELLANEOUS) ×2 IMPLANT
ELECT REM PT RETURN 15FT ADLT (MISCELLANEOUS) ×3 IMPLANT
GAUZE 4X4 16PLY RFD (DISPOSABLE) ×6 IMPLANT
GLOVE BIO SURGEON STRL SZ 6.5 (GLOVE) ×2 IMPLANT
GLOVE BIO SURGEONS STRL SZ 6.5 (GLOVE) ×1
GLOVE BIOGEL M STRL SZ7.5 (GLOVE) ×3 IMPLANT
GOWN STRL REUS W/TWL LRG LVL3 (GOWN DISPOSABLE) ×3 IMPLANT
GOWN STRL REUS W/TWL XL LVL3 (GOWN DISPOSABLE) ×3 IMPLANT
KIT ACCESSORY AMS 800 ×2 IMPLANT
KIT BASIN (CUSTOM PROCEDURE TRAY) ×3 IMPLANT
KIT TURNOVER KIT A (KITS) IMPLANT
LOOP VESSEL MAXI BLUE (MISCELLANEOUS) ×3 IMPLANT
PAD DRESSING TELFA 3X8 NADH (GAUZE/BANDAGES/DRESSINGS) ×1 IMPLANT
PAD TELFA 2X3 NADH STRL (GAUZE/BANDAGES/DRESSINGS) ×2 IMPLANT
PENCIL SMOKE EVACUATOR (MISCELLANEOUS) IMPLANT
PLUG CATH AND CAP STER (CATHETERS) ×3 IMPLANT
PRESS REG BALL 61 70CM (Miscellaneous) ×3 IMPLANT
PROTECTOR NERVE ULNAR (MISCELLANEOUS) ×3 IMPLANT
SHEET LAVH (DRAPES) ×3 IMPLANT
SUT MNCRL AB 4-0 PS2 18 (SUTURE) ×8 IMPLANT
SUT SILK 0 FSL (SUTURE) ×3 IMPLANT
SUT VIC AB 0 CT1 27 (SUTURE) ×6
SUT VIC AB 0 CT1 27XBRD ANTBC (SUTURE) ×1 IMPLANT
SUT VIC AB 3-0 SH 27 (SUTURE) ×15
SUT VIC AB 3-0 SH 27X BRD (SUTURE) ×4 IMPLANT
SYR 10ML LL (SYRINGE) ×6 IMPLANT
SYR 20ML LL LF (SYRINGE) ×3 IMPLANT
SYR 30ML LL (SYRINGE) ×3 IMPLANT
TOWEL OR 17X26 10 PK STRL BLUE (TOWEL DISPOSABLE) ×3 IMPLANT
TOWEL OR NON WOVEN STRL DISP B (DISPOSABLE) ×3 IMPLANT
TRAY CYSTO PACK (CUSTOM PROCEDURE TRAY) ×3 IMPLANT
TUBING CONNECTING 10 (TUBING) ×2 IMPLANT
TUBING CONNECTING 10' (TUBING) ×1
YANKAUER SUCT BULB TIP 10FT TU (MISCELLANEOUS) ×3 IMPLANT

## 2019-05-21 NOTE — Anesthesia Postprocedure Evaluation (Signed)
Anesthesia Post Note  Patient: Rodney [name] [facility]  Procedure(s) Performed: IMPLANTATION OF ARTIFICIAL URINARY SPHINCTER CYSTOSCOPY (N/A )     Patient location during evaluation: PACU Anesthesia Type: General Level of consciousness: sedated Pain management: pain level controlled Vital Signs Assessment: post-procedure vital signs reviewed and stable Respiratory status: spontaneous breathing and respiratory function stable Cardiovascular status: stable Postop Assessment: no apparent nausea or vomiting Anesthetic complications: no    Last Vitals:  Vitals:   05/21/19 1215 05/21/19 1230  BP: 118/68 116/65  Pulse: 77 77  Resp: 15 10  Temp:    SpO2: 100% 100%    Last Pain:  Vitals:   05/21/19 1053  TempSrc:   PainSc: 3                  Gerado Nabers DANIEL

## 2019-05-21 NOTE — Op Note (Signed)
Preoperative diagnosis: Stress urinary incontinence Postoperative diagnosis: Stress urinary incontinence Surgery: Implantation of artificial urinary sphincter and cystoscopy Surgeon: Dr. Nicki Reaper Liviana Mills Assistant: Estill Bamberg dancy  The patient has the above diagnosis and consented to the above procedure.  Extra care was taken with leg positioning to minimize risk of compartment syndrome and neuropathy and deep vein thrombosis.  He has a short perineum.  He is markedly obese especially in the lower abdomen.  Retraction on the lower abdomen help me identify future landmarks for the reservoir.  Preoperative antibiotics were given.  Patient was on Bactrim and had an asymptomatic positive culture  I initially cystoscoped the patient.  He had approximately 17-18 [place] 1 cm bladder neck contracture.  The scope went in nicely.  Foley catheter 33 French easily was placed.  I did not dilate the bladder neck.  Urethra looked healthy  I made a 5 to 6 cm perineal incision and dissected down through subcutaneous tissue mobilized the subcutaneous tissue from the bulbar spongiosis muscle.  I split the bulbospongiosus muscle in the midline and dissected out the bulbar urethra.  I did my box technique and mobilized the bulbar urethra circumferentially.  I could feel a nice sail of tissue.  I passed the right angle at the level of the tunica albuginea from right to left using cutting current on its tip.  Vessey loop was applied.  Appropriate sized opening was made at the bifurcation of the corporal body.  With careful landmarks and because of his marked obesity I ended up making a 7 or 8 cm right lower quadrant incision.  We had to use upward retraction of the abdomen and both surgeons were on the patient's right side I was very pleased with the location.  I stayed anatomic's dissecting through skin subcutaneous tissue onto a nice abdominal fascia.  It was split with scalpel and then scissors along its fibers.  I used my  Kelly technique to spread the thinner abdominal muscle.  His belly was quite tense and protuberant.  I finger dissected at the level of the preperitoneal fat and nice pocket.  Prepared reservoir was inserted followed by 25 cc of normal saline.  The fascia was then closed with running 0 Vicryl on a CT1 needle from each apex medially.  Dissecting the subcutaneous tissue towards the right hemiscrotum was challenging again due to obesity.  Incision was lengthened medially another centimeter.  He had scarring near the right high hemiscrotum likely from previous radiation.  I was able to deliver the right dependent scrotum with my usual technique.  I used a Babcock and some finger dissection because it was almost blind.  I was able to make a nice circumferential pocket.  I delivered the pump into the right lower scrotum with my usual passer sponge forcep.  It laid in nicely.  It was clamped in place with a Babcock with skin protector.  I then placed a 4 cm cuff around the bulbar urethra with the described technique.  Tubing was passed deep to the bulbospongiosus muscle up through subcutaneous tissue onto the pulp my index finger delivering it abdominally.  All quick connects with the described technique brought tubings together.  Device was cycled 3 times and left a dimple.  I was very pleased with location of the cuff.  I closed the lower right the right lower quadrant incision with running 3-0 Vicryl subcutaneous tissue followed by 4-0 subcuticular.  I did an anatomic 3 layer closure of 3-0 Vicryl in the perineum followed  by 4-0 subcuticular.  Leg position was good.  There was no injury.  Very pleased with surgery.  Hopefully it reaches the patient's treatment goal.  Sterile dressing applied  In my opinion patient is at risk of abdominal hernia from his obese abdomen pressing in the area of the reservoir.  The pump is a little bit higher than the most dependent low hanging right scrotum which should be easier to  locate with his obesity

## 2019-05-21 NOTE — Plan of Care (Signed)
Patient handbook provided. Bed alarm set. Call bell and patient belongings were placed within reach. Patient will call for assistance when needing to get out of bed.

## 2019-05-21 NOTE — Transfer of Care (Signed)
Immediate Anesthesia Transfer of Care Note  Patient: Rodney [name] [facility]  Procedure(s) Performed: IMPLANTATION OF ARTIFICIAL URINARY SPHINCTER CYSTOSCOPY (N/A )  Patient Location: PACU  Anesthesia Type:General  Level of Consciousness: drowsy, patient cooperative and responds to stimulation  Airway & Oxygen Therapy: Patient Spontanous Breathing and Patient connected to face mask oxygen  Post-op Assessment: Report given to RN and Post -op Vital signs reviewed and stable  Post vital signs: Reviewed and stable  Last Vitals:  Vitals Value Taken Time  BP 113/75 05/21/19 1018  Temp    Pulse 73 05/21/19 1019  Resp 13 05/21/19 1019  SpO2 100 % 05/21/19 1019  Vitals shown include unvalidated device data.  Last Pain:  Vitals:   05/21/19 0554  TempSrc:   PainSc: 0-No pain         Complications: No apparent anesthesia complications

## 2019-05-21 NOTE — Interval H&P Note (Signed)
History and Physical Interval Note:  05/21/2019 7:20 AM  Rodney [name]  has presented today for surgery, with the diagnosis of STRESS INCONTINENCE.  The various methods of treatment have been discussed with the patient and family. After consideration of risks, benefits and other options for treatment, the patient has consented to  Procedure(s): IMPLANTATION OF ARTIFICIAL URINARY SPHINCTER CYSTOSCOPY (N/A) as a surgical intervention.  The patient's history has been reviewed, patient examined, no change in status, stable for surgery.  I have reviewed the patient's chart and labs.  Questions were answered to the patient's satisfaction.     Rodney [name]

## 2019-05-21 NOTE — Anesthesia Procedure Notes (Signed)
Procedure Name: Intubation Date/Time: 05/21/2019 7:43 AM Performed by: Glory Buff, CRNA Pre-anesthesia Checklist: Patient identified, Emergency Drugs available, Suction available and Patient being monitored Patient Re-evaluated:Patient Re-evaluated prior to induction Oxygen Delivery Method: Circle system utilized Preoxygenation: Pre-oxygenation with 100% oxygen Induction Type: IV induction Ventilation: Mask ventilation without difficulty and Oral airway inserted - appropriate to patient size Laryngoscope Size: Sabra Heck and 3 Grade View: Grade I Tube type: Oral Tube size: 7.5 mm Number of attempts: 1 Airway Equipment and Method: Stylet and Oral airway Placement Confirmation: ETT inserted through vocal cords under direct vision,  positive ETCO2 and breath sounds checked- equal and bilateral Secured at: 21 cm Tube secured with: Tape Dental Injury: Teeth and Oropharynx as per pre-operative assessment

## 2019-05-22 DIAGNOSIS — N320 Bladder-neck obstruction: Secondary | ICD-10-CM | POA: Diagnosis not present

## 2019-05-22 DIAGNOSIS — N3946 Mixed incontinence: Secondary | ICD-10-CM | POA: Diagnosis not present

## 2019-05-22 DIAGNOSIS — M109 Gout, unspecified: Secondary | ICD-10-CM | POA: Diagnosis not present

## 2019-05-22 DIAGNOSIS — Z923 Personal history of irradiation: Secondary | ICD-10-CM | POA: Diagnosis not present

## 2019-05-22 DIAGNOSIS — E119 Type 2 diabetes mellitus without complications: Secondary | ICD-10-CM | POA: Diagnosis not present

## 2019-05-22 DIAGNOSIS — Z79899 Other long term (current) drug therapy: Secondary | ICD-10-CM | POA: Diagnosis not present

## 2019-05-22 DIAGNOSIS — Z7984 Long term (current) use of oral hypoglycemic drugs: Secondary | ICD-10-CM | POA: Diagnosis not present

## 2019-05-22 DIAGNOSIS — K219 Gastro-esophageal reflux disease without esophagitis: Secondary | ICD-10-CM | POA: Diagnosis not present

## 2019-05-22 DIAGNOSIS — E039 Hypothyroidism, unspecified: Secondary | ICD-10-CM | POA: Diagnosis not present

## 2019-05-22 DIAGNOSIS — I10 Essential (primary) hypertension: Secondary | ICD-10-CM | POA: Diagnosis not present

## 2019-05-22 DIAGNOSIS — Z87891 Personal history of nicotine dependence: Secondary | ICD-10-CM | POA: Diagnosis not present

## 2019-05-22 LAB — BASIC METABOLIC PANEL
Anion gap: 7 (ref 5–15)
BUN: 28 mg/dL — ABNORMAL HIGH (ref 8–23)
CO2: 19 mmol/L — ABNORMAL LOW (ref 22–32)
Calcium: 8.4 mg/dL — ABNORMAL LOW (ref 8.9–10.3)
Chloride: 108 mmol/L (ref 98–111)
Creatinine, Ser: 2.17 mg/dL — ABNORMAL HIGH (ref 0.61–1.24)
GFR calc Af Amer: 33 mL/min — ABNORMAL LOW (ref >60)
GFR calc non Af Amer: 28 mL/min — ABNORMAL LOW (ref >60)
Glucose, Bld: 280 mg/dL — ABNORMAL HIGH (ref 70–99)
Potassium: 5 mmol/L (ref 3.5–5.1)
Sodium: 134 mmol/L — ABNORMAL LOW (ref 135–145)

## 2019-05-22 LAB — GLUCOSE, CAPILLARY
Glucose-Capillary: 226 mg/dL — ABNORMAL HIGH (ref 70–99)
Glucose-Capillary: 201 mg/dL — ABNORMAL HIGH (ref 70–99)

## 2019-05-22 LAB — HEMOGLOBIN AND HEMATOCRIT, BLOOD
HCT: 33.3 % — ABNORMAL LOW (ref 39.0–52.0)
Hemoglobin: 10.9 g/dL — ABNORMAL LOW (ref 13.0–17.0)

## 2019-05-22 NOTE — Progress Notes (Signed)
Patient has been discharged, instructions provided. No questions and or concerns at this time. Instruction provided on  dressing change, supplies provided.No changes from am assessment. Pt A&Ox4, ambulatory without assistance.

## 2019-05-22 NOTE — Discharge Summary (Signed)
[facility] Discharge Summary  Admit date: 05/21/2019  Discharge date and time: 05/22/19   Discharge to: Home  Discharge Service: Urology  Discharge Attending Physician:  Dr. Nicki Reaper MacDiarmid  Discharge  Diagnoses: Urinary incontinence  Secondary Diagnosis: Active Problems:   Incontinence when straining, male  OR Procedures: Procedure(s): IMPLANTATION OF ARTIFICIAL URINARY SPHINCTER CYSTOSCOPY 05/21/2019   Ancillary Procedures: None   Discharge Day Services: The patient was seen and examined by the Urology team both in the morning and immediately prior to discharge.  Vital signs and laboratory values were stable and within normal limits.  The physical exam was benign and unchanged and all surgical wounds were examined. Foley catheter was removed.  Discharge instructions were explained and all questions answered.  Subjective  No acute events overnight. Pain Controlled. No fever or chills.  Objective Patient Vitals for the past 8 hrs:  BP Temp Temp src Pulse SpO2  05/22/19 0602 118/69 98.1 F (36.7 C) Oral 82 98 %   Total I/O In: -  Out: 500 [Urine:500]  General Appearance:        No acute distress Lungs:                       Normal work of breathing on room air Heart:                                Regular rate and rhythm Abdomen:                         Soft, non-tender, non-distended Extremities:                      Warm and well perfused Wounds:       Abdominal and perineal incisions were both clean dry and intact   Hospital Course:  The patient underwent cystoscopy with insertion of artifical urinary sphincter on 05/21/2019.  The patient tolerated the procedure well, was extubated in the OR, and afterwards was taken to the PACU for routine post-surgical care. When stable the patient was transferred to the floor.   The patient did well postoperatively.  The patient's diet was advanced and at the time of discharge was tolerating a regular diet. Foley catheter was  removed on the morning of POD1; bladder scan 2hr later was 6cc; he was leaking spontaneously. The patient was discharged home 1 Day Post-Op, at which point was tolerating a regular solid diet, was able to void spontaneously, have adequate pain control with P.O. pain medication, and could ambulate without difficulty. The patient will follow up with [place] for post op check.   Condition at Discharge: Improved  Discharge Medications:  Allergies as of 05/22/2019      Reactions   Ace Inhibitors    Tongue swelling. Reaction to benazepril only.       Medication List    TAKE these medications   amLODipine 5 MG tablet Commonly known as: NORVASC TAKE 1 TABLET BY MOUTH TWICE DAILY (MEDICATION  INCREASED) What changed: See the new instructions.   atorvastatin 20 MG tablet Commonly known as: LIPITOR TAKE 1 TABLET BY MOUTH ONCE DAILY . APPOINTMENT REQUIRED FOR FUTURE REFILLS What changed: See the new instructions.   dorzolamide-timolol 22.3-6.8 MG/ML ophthalmic solution Commonly known as: COSOPT Place 1 drop into both eyes See admin instructions. 1 drop into left eye twice daily & 1 drop into  right eye in the morning   glipiZIDE 5 MG tablet Commonly known as: GLUCOTROL TAKE 1 TABLET BY MOUTH ONCE DAILY BEFORE BREAKFAST What changed: See the new instructions.   glucose blood test strip Commonly known as: Accu-Chek Aviva 1 each by Other route 2 (two) times daily.   HYDROcodone-acetaminophen 5-325 MG tablet Commonly known as: Norco Take 1-2 tablets by mouth every 6 (six) hours as needed for moderate pain.   latanoprost 0.005 % ophthalmic solution Commonly known as: XALATAN Place 1 drop into both eyes at bedtime.   losartan 50 MG tablet Commonly known as: COZAAR Take 1 tablet (50 mg total) by mouth daily. What changed: when to take this

## 2019-05-23 ENCOUNTER — Encounter: Payer: Self-pay | Admitting: *Deleted

## 2019-05-31 ENCOUNTER — Other Ambulatory Visit: Payer: Self-pay | Admitting: Nurse Practitioner

## 2019-06-03 ENCOUNTER — Other Ambulatory Visit: Payer: Self-pay | Admitting: Internal Medicine

## 2019-06-04 ENCOUNTER — Other Ambulatory Visit: Payer: Self-pay | Admitting: Internal Medicine

## 2019-06-05 DIAGNOSIS — H209 Unspecified iridocyclitis: Secondary | ICD-10-CM | POA: Diagnosis not present

## 2019-06-06 ENCOUNTER — Other Ambulatory Visit: Payer: Self-pay

## 2019-06-07 ENCOUNTER — Ambulatory Visit: Payer: Medicare Other | Admitting: Internal Medicine

## 2019-06-10 ENCOUNTER — Other Ambulatory Visit: Payer: Self-pay

## 2019-06-11 ENCOUNTER — Ambulatory Visit (INDEPENDENT_AMBULATORY_CARE_PROVIDER_SITE_OTHER): Payer: Medicare Other | Admitting: Internal Medicine

## 2019-06-11 ENCOUNTER — Encounter: Payer: Self-pay | Admitting: Internal Medicine

## 2019-06-11 VITALS — BP 112/72 | HR 92 | Temp 97.4°F | Ht 68.0 in | Wt 228.9 lb

## 2019-06-11 DIAGNOSIS — C61 Malignant neoplasm of prostate: Secondary | ICD-10-CM | POA: Diagnosis not present

## 2019-06-11 DIAGNOSIS — E785 Hyperlipidemia, unspecified: Secondary | ICD-10-CM | POA: Diagnosis not present

## 2019-06-11 DIAGNOSIS — N1832 Chronic kidney disease, stage 3b: Secondary | ICD-10-CM

## 2019-06-11 DIAGNOSIS — E038 Other specified hypothyroidism: Secondary | ICD-10-CM

## 2019-06-11 DIAGNOSIS — I10 Essential (primary) hypertension: Secondary | ICD-10-CM

## 2019-06-11 DIAGNOSIS — E1121 Type 2 diabetes mellitus with diabetic nephropathy: Secondary | ICD-10-CM

## 2019-06-11 DIAGNOSIS — E039 Hypothyroidism, unspecified: Secondary | ICD-10-CM

## 2019-06-11 DIAGNOSIS — E1122 Type 2 diabetes mellitus with diabetic chronic kidney disease: Secondary | ICD-10-CM

## 2019-06-11 DIAGNOSIS — N183 Chronic kidney disease, stage 3 unspecified: Secondary | ICD-10-CM

## 2019-06-11 MED ORDER — LOSARTAN POTASSIUM 50 MG PO TABS: 50.0000 mg | ORAL_TABLET | Freq: Every day | ORAL | 1 refills | Status: DC

## 2019-06-11 MED ORDER — ATORVASTATIN CALCIUM 20 MG PO TABS: 20.0000 mg | ORAL_TABLET | Freq: Every day | ORAL | 1 refills | Status: DC

## 2019-06-11 MED ORDER — GLIPIZIDE 5 MG PO TABS: 5.0000 mg | ORAL_TABLET | Freq: Every day | ORAL | 1 refills | Status: DC

## 2019-06-11 MED ORDER — AMLODIPINE BESYLATE 5 MG PO TABS: 5.0000 mg | ORAL_TABLET | Freq: Two times a day (BID) | ORAL | 1 refills | Status: DC

## 2019-06-11 NOTE — Progress Notes (Signed)
Established Patient Office Visit     This visit occurred during the SARS-CoV-2 public health emergency.  Safety protocols were in place, including screening questions prior to the visit, additional usage of staff PPE, and extensive cleaning of exam room while observing appropriate contact time as indicated for disinfecting solutions.    CC/Reason for Visit: Medication refills, follow-up chronic medical conditions  HPI: Rodney [name] is a 79 y.o. male who is coming in today for the above mentioned reasons. Past Medical History is significant for: Well-controlled hypertension and diabetes, hyperlipidemia, stage III chronic kidney disease with baseline creatinine around 2.17 and subclinical hypothyroidism.  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  He is here mainly today because he was denied medication refills as he has not been seen in person in over a year.  He has no acute complaints.  He has received both of his Covid vaccines.   Past Medical/Surgical History: Past Medical History:  Diagnosis Date  . Bladder calculus   . CAD (coronary artery disease)    non obs   . CKD (chronic kidney disease), stage III    " i have 35-40% of my kidney function"; mgd by pcp   . COLONIC POLYPS, HX OF 12/02/2008  . DIABETES MELLITUS, TYPE II 07/19/2007  . Dyspnea    reports sob over the last 2 years , underwent cath  in feb 2020 , reports cath was clean , referred to pulmonologist that stopped his Actos and encouraged weight loss  ; reports today minimum improvement in breathing , appt with pulm in June 2020  . Erectile dysfunction   . GERD (gastroesophageal reflux disease)   . Glaucoma   . GOUT 07/19/2007  . History of kidney stones   . HYPERLIPIDEMIA 12/02/2008  . HYPERTENSION 07/19/2007  . Kidney stones   . Prostate cancer ([place])   . Reiter syndrome    incomplete  . Vitamin D deficiency     Past Surgical History:  Procedure  Laterality Date  . bladder neck dilation    . CATARACT EXTRACTION     both eyes- 2015  . COLONOSCOPY    . CYSTOSCOPY N/A 06/11/2018   Procedure: CYSTOSCOPY WITH REMOVAL OF BLADDER CALCULUS;  Surgeon: [name], MD;  Location: [facility];  Service: Urology;  Laterality: N/A;  . ELBOW SURGERY     bone spur - right  . LEFT HEART CATH AND CORONARY ANGIOGRAPHY N/A 03/19/2018   Procedure: LEFT HEART CATH AND CORONARY ANGIOGRAPHY;  Surgeon: Belva Crome, MD;  Location: [facility];  Service: Cardiovascular;  Laterality: N/A;  . ORIF ANKLE FRACTURE Left 05/17/2015   Procedure: OPEN REDUCTION INTERNAL FIXATION (ORIF) ANKLE FRACTURE;  Surgeon: Meredith Pel, MD;  Location: [facility];  Service: Orthopedics;  Laterality: Left;  . PROSTATECTOMY    . URINARY SPHINCTER IMPLANT N/A 05/21/2019   Procedure: IMPLANTATION OF ARTIFICIAL URINARY SPHINCTER CYSTOSCOPY;  Surgeon: Bjorn Loser, MD;  Location: [facility];  Service: Urology;  Laterality: N/A;  . VASECTOMY      Social History:  reports that he quit smoking about 41 years ago. His smoking use included cigarettes. He has a 30.00 pack-year smoking history. He has never used smokeless tobacco. He reports current alcohol use. He reports that he does not use drugs.  Allergies: Allergies  Allergen Reactions  . Ace Inhibitors     Tongue swelling. Reaction to benazepril only.     Family History:  Family  History  Problem Relation Age of Onset  . Cancer Sister        metastatic colon ca  . Colon cancer Neg Hx   . Esophageal cancer Neg Hx   . Rectal cancer Neg Hx   . Stomach cancer Neg Hx   . Lung disease Neg Hx      Current Outpatient Medications:  .  amLODipine (NORVASC) 5 MG tablet, Take 1 tablet (5 mg total) by mouth 2 (two) times daily., Disp: 180 tablet, Rfl: 1 .  atorvastatin (LIPITOR) 20 MG tablet, Take 1 tablet (20 mg total) by mouth at bedtime., Disp: 90 tablet, Rfl: 1 .  dorzolamide-timolol (COSOPT) 22.3-6.8 MG/ML ophthalmic  solution, Place 1 drop into both eyes See admin instructions. 1 drop into left eye twice daily & 1 drop into right eye in the morning, Disp: , Rfl:  .  glipiZIDE (GLUCOTROL) 5 MG tablet, Take 1 tablet (5 mg total) by mouth daily before breakfast., Disp: 90 tablet, Rfl: 1 .  glucose blood (ACCU-CHEK AVIVA) test strip, 1 each by Other route 2 (two) times daily., Disp: 100 each, Rfl: 3 .  latanoprost (XALATAN) 0.005 % ophthalmic solution, Place 1 drop into both eyes at bedtime. , Disp: , Rfl:  .  losartan (COZAAR) 50 MG tablet, Take 1 tablet (50 mg total) by mouth daily., Disp: 90 tablet, Rfl: 1  Review of Systems:  Constitutional: Denies fever, chills, diaphoresis, appetite change and fatigue.  HEENT: Denies photophobia, eye pain, redness, hearing loss, ear pain, congestion, sore throat, rhinorrhea, sneezing, mouth sores, trouble swallowing, neck pain, neck stiffness and tinnitus.   Respiratory: Denies SOB, DOE, cough, chest tightness,  and wheezing.   Cardiovascular: Denies chest pain, palpitations and leg swelling.  Gastrointestinal: Denies nausea, vomiting, abdominal pain, diarrhea, constipation, blood in stool and abdominal distention.  Genitourinary: Denies dysuria, urgency, frequency, hematuria, flank pain and difficulty urinating.  Endocrine: Denies: hot or cold intolerance, sweats, changes in hair or nails, polyuria, polydipsia. Musculoskeletal: Denies myalgias, back pain, joint swelling, arthralgias and gait problem.  Skin: Denies pallor, rash and wound.  Neurological: Denies dizziness, seizures, syncope, weakness, light-headedness, numbness and headaches.  Hematological: Denies adenopathy. Easy bruising, personal or family bleeding history  Psychiatric/Behavioral: Denies suicidal ideation, mood changes, confusion, nervousness, sleep disturbance and agitation    Physical Exam: Vitals:   06/11/19 1525  BP: 112/72  Pulse: 92  Temp: (!) 97.4 F (36.3 C)  TempSrc: Temporal  SpO2: 98%   Weight: 228 lb 14.4 oz (103.8 kg)  Height: 5\' 8"  (1.727 m)    Body mass index is 34.8 kg/m.   Constitutional: NAD, calm, comfortable Eyes: PERRL, lids and conjunctivae normal, wears corrective lenses ENMT: Mucous membranes are moist. Respiratory: clear to auscultation bilaterally, no wheezing, no crackles. Normal respiratory effort. No accessory muscle use.  Cardiovascular: Regular rate and rhythm, no murmurs / rubs / gallops. No extremity edema. .  Neurologic: Grossly intact and nonfocal Psychiatric: Normal judgment and insight. Alert and oriented x 3. Normal mood.    Impression and Plan:  Subclinical hypothyroidism -Check TSH when he returns for physical.  Type 2 diabetes mellitus with stage 3 chronic kidney disease, without long-term current use of insulin ([place])  - Plan: glipiZIDE (GLUCOTROL) 5 MG tablet -Well-controlled with her recent A1c of 6.4 on 4/21.  Morbid (severe) obesity due to excess calories ([place]) complicated by hbp/dm -Discussed healthy lifestyle, including increased physical activity and better food choices to promote weight loss.  Prostate cancer [facility]) -Followed by urology  Dyslipidemia  - Plan: atorvastatin (LIPITOR) 20 MG tablet -Check lipids when he returns for CPE.  Essential hypertension  - Plan: amLODipine (NORVASC) 5 MG tablet, losartan (COZAAR) 50 MG tablet -Well-controlled on current regimen.    Patient Instructions  -Nice seeing you today!!  -Schedule follow up for your physical in 3 months.     Lelon Frohlich, MD [facility] at [facility]

## 2019-06-11 NOTE — Patient Instructions (Signed)
-  Nice seeing you today!!  -Schedule follow up for your physical in 3 months.

## 2019-06-26 DIAGNOSIS — H2000 Unspecified acute and subacute iridocyclitis: Secondary | ICD-10-CM | POA: Diagnosis not present

## 2019-07-10 DIAGNOSIS — H209 Unspecified iridocyclitis: Secondary | ICD-10-CM | POA: Diagnosis not present

## 2019-07-10 DIAGNOSIS — H40052 Ocular hypertension, left eye: Secondary | ICD-10-CM | POA: Diagnosis not present

## 2019-07-10 DIAGNOSIS — H353131 Nonexudative age-related macular degeneration, bilateral, early dry stage: Secondary | ICD-10-CM | POA: Diagnosis not present

## 2019-08-22 DIAGNOSIS — H209 Unspecified iridocyclitis: Secondary | ICD-10-CM | POA: Diagnosis not present

## 2019-08-22 DIAGNOSIS — H4062X1 Glaucoma secondary to drugs, left eye, mild stage: Secondary | ICD-10-CM | POA: Diagnosis not present

## 2019-08-22 DIAGNOSIS — H4062X0 Glaucoma secondary to drugs, left eye, stage unspecified: Secondary | ICD-10-CM | POA: Diagnosis not present

## 2019-09-11 ENCOUNTER — Ambulatory Visit (INDEPENDENT_AMBULATORY_CARE_PROVIDER_SITE_OTHER): Payer: Medicare Other | Admitting: Internal Medicine

## 2019-09-11 ENCOUNTER — Other Ambulatory Visit: Payer: Self-pay

## 2019-09-11 ENCOUNTER — Encounter: Payer: Self-pay | Admitting: Internal Medicine

## 2019-09-11 VITALS — BP 110/64 | HR 70 | Temp 97.8°F | Ht 68.0 in | Wt 230.4 lb

## 2019-09-11 DIAGNOSIS — E1121 Type 2 diabetes mellitus with diabetic nephropathy: Secondary | ICD-10-CM | POA: Diagnosis not present

## 2019-09-11 DIAGNOSIS — E1122 Type 2 diabetes mellitus with diabetic chronic kidney disease: Secondary | ICD-10-CM

## 2019-09-11 DIAGNOSIS — E039 Hypothyroidism, unspecified: Secondary | ICD-10-CM | POA: Diagnosis not present

## 2019-09-11 DIAGNOSIS — E785 Hyperlipidemia, unspecified: Secondary | ICD-10-CM

## 2019-09-11 DIAGNOSIS — N1831 Chronic kidney disease, stage 3a: Secondary | ICD-10-CM

## 2019-09-11 DIAGNOSIS — E038 Other specified hypothyroidism: Secondary | ICD-10-CM

## 2019-09-11 DIAGNOSIS — C61 Malignant neoplasm of prostate: Secondary | ICD-10-CM

## 2019-09-11 DIAGNOSIS — N1832 Chronic kidney disease, stage 3b: Secondary | ICD-10-CM | POA: Diagnosis not present

## 2019-09-11 DIAGNOSIS — I10 Essential (primary) hypertension: Secondary | ICD-10-CM

## 2019-09-11 LAB — POCT GLYCOSYLATED HEMOGLOBIN (HGB A1C): Hemoglobin A1C: 6.5 % — AB (ref 4.0–5.6)

## 2019-09-11 NOTE — Progress Notes (Signed)
Established Patient Office Visit     This visit occurred during the SARS-CoV-2 public health emergency.  Safety protocols were in place, including screening questions prior to the visit, additional usage of staff PPE, and extensive cleaning of exam room while observing appropriate contact time as indicated for disinfecting solutions.    CC/Reason for Visit: Follow-up chronic medical conditions, requesting lab work  HPI: Rodney [name] is a 79 y.o. male who is coming in today for the above mentioned reasons. Past Medical History is significant for: Well-controlled hypertension and diabetes, hyperlipidemia, stage III chronic kidney disease with baseline creatinine around 2.17 and subclinical hypothyroidism.  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 He has been doing well since we last spoke and has no acute complaints. He has received both of his Covid vaccines.   Past Medical/Surgical History: Past Medical History:  Diagnosis Date  . Bladder calculus   . CAD (coronary artery disease)    non obs   . CKD (chronic kidney disease), stage III    " i have 35-40% of my kidney function"; mgd by pcp   . COLONIC POLYPS, HX OF 12/02/2008  . DIABETES MELLITUS, TYPE II 07/19/2007  . Dyspnea    reports sob over the last 2 years , underwent cath  in feb 2020 , reports cath was clean , referred to pulmonologist that stopped his Actos and encouraged weight loss  ; reports today minimum improvement in breathing , appt with pulm in June 2020  . Erectile dysfunction   . GERD (gastroesophageal reflux disease)   . Glaucoma   . GOUT 07/19/2007  . History of kidney stones   . HYPERLIPIDEMIA 12/02/2008  . HYPERTENSION 07/19/2007  . Kidney stones   . Prostate cancer ([place])   . Reiter syndrome    incomplete  . Vitamin D deficiency     Past Surgical History:  Procedure Laterality Date  . bladder neck dilation    . CATARACT EXTRACTION     both  eyes- 2015  . COLONOSCOPY    . CYSTOSCOPY N/A 06/11/2018   Procedure: CYSTOSCOPY WITH REMOVAL OF BLADDER CALCULUS;  Surgeon: [name], MD;  [facility];  Service: Urology;  Laterality: N/A;  . ELBOW SURGERY     bone spur - right  . LEFT HEART CATH AND CORONARY ANGIOGRAPHY N/A 03/19/2018   Procedure: LEFT HEART CATH AND CORONARY ANGIOGRAPHY;  Surgeon: Belva Crome, MD;  Location: [facility];  Service: Cardiovascular;  Laterality: N/A;  . ORIF ANKLE FRACTURE Left 05/17/2015   Procedure: OPEN REDUCTION INTERNAL FIXATION (ORIF) ANKLE FRACTURE;  Surgeon: Meredith Pel, MD;  [facility];  Service: Orthopedics;  Laterality: Left;  . PROSTATECTOMY    . URINARY SPHINCTER IMPLANT N/A 05/21/2019   Procedure: IMPLANTATION OF ARTIFICIAL URINARY SPHINCTER CYSTOSCOPY;  Surgeon: Bjorn Loser, MD;  [facility];  Service: Urology;  Laterality: N/A;  . VASECTOMY      Social History:  reports that he quit smoking about 41 years ago. His smoking use included cigarettes. He has a 30.00 pack-year smoking history. He has never used smokeless tobacco. He reports current alcohol use. He reports that he does not use drugs.  Allergies: Allergies  Allergen Reactions  . Ace Inhibitors     Tongue swelling. Reaction to benazepril only.     Family History:  Family History  Problem Relation Age of Onset  . Cancer Sister  metastatic colon ca  . Colon cancer Neg Hx   . Esophageal cancer Neg Hx   . Rectal cancer Neg Hx   . Stomach cancer Neg Hx   . Lung disease Neg Hx      Current Outpatient Medications:  .  amLODipine (NORVASC) 5 MG tablet, Take 1 tablet (5 mg total) by mouth 2 (two) times daily., Disp: 180 tablet, Rfl: 1 .  atorvastatin (LIPITOR) 20 MG tablet, Take 1 tablet (20 mg total) by mouth at bedtime., Disp: 90 tablet, Rfl: 1 .  dorzolamide-timolol (COSOPT) 22.3-6.8 MG/ML ophthalmic solution, Place 1 drop into both eyes See admin instructions. 1 drop into left  eye twice daily & 1 drop into right eye in the morning, Disp: , Rfl:  .  glipiZIDE (GLUCOTROL) 5 MG tablet, Take 1 tablet (5 mg total) by mouth daily before breakfast., Disp: 90 tablet, Rfl: 1 .  glucose blood (ACCU-CHEK AVIVA) test strip, 1 each by Other route 2 (two) times daily., Disp: 100 each, Rfl: 3 .  latanoprost (XALATAN) 0.005 % ophthalmic solution, Place 1 drop into both eyes at bedtime. , Disp: , Rfl:  .  losartan (COZAAR) 50 MG tablet, Take 1 tablet (50 mg total) by mouth daily., Disp: 90 tablet, Rfl: 1  Review of Systems:  Constitutional: Denies fever, chills, diaphoresis, appetite change and fatigue.  HEENT: Denies photophobia, eye pain, redness, hearing loss, ear pain, congestion, sore throat, rhinorrhea, sneezing, mouth sores, trouble swallowing, neck pain, neck stiffness and tinnitus.   Respiratory: Denies SOB, DOE, cough, chest tightness,  and wheezing.   Cardiovascular: Denies chest pain, palpitations and leg swelling.  Gastrointestinal: Denies nausea, vomiting, abdominal pain, diarrhea, constipation, blood in stool and abdominal distention.  Genitourinary: Denies dysuria, urgency, frequency, hematuria, flank pain and difficulty urinating.  Endocrine: Denies: hot or cold intolerance, sweats, changes in hair or nails, polyuria, polydipsia. Musculoskeletal: Denies myalgias, back pain, joint swelling, arthralgias and gait problem.  Skin: Denies pallor, rash and wound.  Neurological: Denies dizziness, seizures, syncope, weakness, light-headedness, numbness and headaches.  Hematological: Denies adenopathy. Easy bruising, personal or family bleeding history  Psychiatric/Behavioral: Denies suicidal ideation, mood changes, confusion, nervousness, sleep disturbance and agitation    Physical Exam: Vitals:   09/11/19 0855  BP: 110/64  Pulse: 70  Temp: 97.8 F (36.6 C)  TempSrc: Oral  SpO2: 97%  Weight: 230 lb 6.4 oz (104.5 kg)  Height: 5\' 8"  (1.727 m)    Body mass index is  35.03 kg/m.   Constitutional: NAD, calm, comfortable Eyes: PERRL, lids and conjunctivae normal, wears corrective lenses ENMT: Mucous membranes are moist. Respiratory: clear to auscultation bilaterally, no wheezing, no crackles. Normal respiratory effort. No accessory muscle use.  Cardiovascular: Regular rate and rhythm, no murmurs / rubs / gallops. Trace bilateral lower extremity edema.  Neurologic: Grossly intact and nonfocal Psychiatric: Normal judgment and insight. Alert and oriented x 3. Normal mood.    Impression and Plan:  Type 2 diabetes mellitus with stage 3b chronic kidney disease, without long-term current use of insulin ([place])  -Well-controlled with an A1c of 6.5 today.  Subclinical hypothyroidism - Plan: TSH  Morbid (severe) obesity due to excess calories ([place]) complicated by hbp/dm -Discussed healthy lifestyle, including increased physical activity and better food choices to promote weight loss.  Essential hypertension -Well-controlled on current regimen.  Dyslipidemia  - Plan: Lipid panel -Last LDL was 59 in 2019, he is on atorvastatin 20 mg.  Stage 3a chronic kidney disease -Baseline creatinine around 2.170, recheck today.  Prostate cancer ([place]) -Followed by urology.   Patient Instructions  -Nice seeing you today!!  -Lab work today; will notify you once results are available.  -See you back in 3-4 months.     Lelon Frohlich, MD [facility] at [facility]

## 2019-09-11 NOTE — Patient Instructions (Signed)
-  Nice seeing you today!!  -Lab work today; will notify you once results are available.  -See you back in 3-4 months.

## 2019-09-12 LAB — COMPREHENSIVE METABOLIC PANEL
AG Ratio: 1.9 (calc) (ref 1.0–2.5)
ALT: 22 U/L (ref 9–46)
AST: 17 U/L (ref 10–35)
Albumin: 4.3 g/dL (ref 3.6–5.1)
Alkaline phosphatase (APISO): 91 U/L (ref 35–144)
BUN/Creatinine Ratio: 12 (calc) (ref 6–22)
BUN: 20 mg/dL (ref 7–25)
CO2: 25 mmol/L (ref 20–32)
Calcium: 9.3 mg/dL (ref 8.6–10.3)
Chloride: 108 mmol/L (ref 98–110)
Creat: 1.71 mg/dL — ABNORMAL HIGH (ref 0.70–1.18)
Globulin: 2.3 g/dL (calc) (ref 1.9–3.7)
Glucose, Bld: 112 mg/dL — ABNORMAL HIGH (ref 65–99)
Potassium: 5 mmol/L (ref 3.5–5.3)
Sodium: 141 mmol/L (ref 135–146)
Total Bilirubin: 0.8 mg/dL (ref 0.2–1.2)
Total Protein: 6.6 g/dL (ref 6.1–8.1)

## 2019-09-12 LAB — CBC WITH DIFFERENTIAL/PLATELET
Absolute Monocytes: 498 cells/uL (ref 200–950)
Basophils Absolute: 38 cells/uL (ref 0–200)
Basophils Relative: 0.6 %
Eosinophils Absolute: 132 cells/uL (ref 15–500)
Eosinophils Relative: 2.1 %
HCT: 41.6 % (ref 38.5–50.0)
Hemoglobin: 13.7 g/dL (ref 13.2–17.1)
Lymphs Abs: 1153 cells/uL (ref 850–3900)
MCH: 32.9 pg (ref 27.0–33.0)
MCHC: 32.9 g/dL (ref 32.0–36.0)
MCV: 99.8 fL (ref 80.0–100.0)
MPV: 10.1 fL (ref 7.5–12.5)
Monocytes Relative: 7.9 %
Neutro Abs: 4479 cells/uL (ref 1500–7800)
Neutrophils Relative %: 71.1 %
Platelets: 265 10*3/uL (ref 140–400)
RBC: 4.17 10*6/uL — ABNORMAL LOW (ref 4.20–5.80)
RDW: 12.3 % (ref 11.0–15.0)
Total Lymphocyte: 18.3 %
WBC: 6.3 10*3/uL (ref 3.8–10.8)

## 2019-09-12 LAB — LIPID PANEL
Cholesterol: 108 mg/dL (ref <200)
HDL: 25 mg/dL — ABNORMAL LOW (ref >=40)
LDL Cholesterol (Calc): 61 mg/dL (calc)
Non-HDL Cholesterol (Calc): 83 mg/dL (calc) (ref <130)
Total CHOL/HDL Ratio: 4.3 (calc) (ref <5.0)
Triglycerides: 140 mg/dL (ref <150)

## 2019-09-12 LAB — TSH: TSH: 3.96 mIU/L (ref 0.40–4.50)

## 2019-10-11 DIAGNOSIS — H524 Presbyopia: Secondary | ICD-10-CM | POA: Diagnosis not present

## 2019-10-11 DIAGNOSIS — Z961 Presence of intraocular lens: Secondary | ICD-10-CM | POA: Diagnosis not present

## 2019-10-11 DIAGNOSIS — E119 Type 2 diabetes mellitus without complications: Secondary | ICD-10-CM | POA: Diagnosis not present

## 2019-10-11 LAB — HM DIABETES EYE EXAM

## 2019-10-18 ENCOUNTER — Encounter: Payer: Self-pay | Admitting: Internal Medicine

## 2019-12-10 ENCOUNTER — Other Ambulatory Visit: Payer: Self-pay | Admitting: Internal Medicine

## 2019-12-10 DIAGNOSIS — E785 Hyperlipidemia, unspecified: Secondary | ICD-10-CM

## 2019-12-10 DIAGNOSIS — I10 Essential (primary) hypertension: Secondary | ICD-10-CM

## 2019-12-27 ENCOUNTER — Other Ambulatory Visit: Payer: Self-pay | Admitting: Internal Medicine

## 2019-12-27 DIAGNOSIS — E1122 Type 2 diabetes mellitus with diabetic chronic kidney disease: Secondary | ICD-10-CM

## 2020-02-21 DIAGNOSIS — H401111 Primary open-angle glaucoma, right eye, mild stage: Secondary | ICD-10-CM | POA: Diagnosis not present

## 2020-03-03 ENCOUNTER — Other Ambulatory Visit: Payer: Self-pay | Admitting: Internal Medicine

## 2020-03-03 DIAGNOSIS — I10 Essential (primary) hypertension: Secondary | ICD-10-CM

## 2020-03-03 DIAGNOSIS — E785 Hyperlipidemia, unspecified: Secondary | ICD-10-CM

## 2020-03-16 ENCOUNTER — Other Ambulatory Visit: Payer: Self-pay | Admitting: Internal Medicine

## 2020-03-16 DIAGNOSIS — I10 Essential (primary) hypertension: Secondary | ICD-10-CM

## 2020-03-30 ENCOUNTER — Other Ambulatory Visit: Payer: Self-pay | Admitting: Family Medicine

## 2020-03-30 DIAGNOSIS — N183 Chronic kidney disease, stage 3 unspecified: Secondary | ICD-10-CM

## 2020-03-30 DIAGNOSIS — E1122 Type 2 diabetes mellitus with diabetic chronic kidney disease: Secondary | ICD-10-CM

## 2020-05-26 DIAGNOSIS — L720 Epidermal cyst: Secondary | ICD-10-CM | POA: Diagnosis not present

## 2020-05-26 DIAGNOSIS — D2371 Other benign neoplasm of skin of right lower limb, including hip: Secondary | ICD-10-CM | POA: Diagnosis not present

## 2020-06-15 ENCOUNTER — Other Ambulatory Visit: Payer: Self-pay | Admitting: Family Medicine

## 2020-06-15 DIAGNOSIS — E1122 Type 2 diabetes mellitus with diabetic chronic kidney disease: Secondary | ICD-10-CM

## 2020-07-22 LAB — PSA: PSA: 17.4

## 2020-07-31 ENCOUNTER — Other Ambulatory Visit: Payer: Self-pay | Admitting: Urology

## 2020-07-31 ENCOUNTER — Other Ambulatory Visit (HOSPITAL_COMMUNITY): Payer: Self-pay | Admitting: Urology

## 2020-07-31 DIAGNOSIS — C61 Malignant neoplasm of prostate: Secondary | ICD-10-CM

## 2020-08-05 ENCOUNTER — Encounter: Payer: Self-pay | Admitting: Internal Medicine

## 2020-08-10 ENCOUNTER — Ambulatory Visit (HOSPITAL_COMMUNITY)
Admission: RE | Admit: 2020-08-10 | Discharge: 2020-08-10 | Disposition: A | Discharge status: Home / Self Care | Payer: Medicare Other | Source: Ambulatory Visit | Attending: Urology | Admitting: Urology

## 2020-08-10 ENCOUNTER — Encounter (HOSPITAL_COMMUNITY)
Admission: RE | Admit: 2020-08-10 | Discharge: 2020-08-10 | Disposition: A | Discharge status: Home / Self Care | Payer: Medicare Other | Source: Ambulatory Visit | Attending: Urology | Admitting: Urology

## 2020-08-10 ENCOUNTER — Other Ambulatory Visit: Payer: Self-pay

## 2020-08-10 DIAGNOSIS — N281 Cyst of kidney, acquired: Secondary | ICD-10-CM | POA: Diagnosis not present

## 2020-08-10 DIAGNOSIS — K8020 Calculus of gallbladder without cholecystitis without obstruction: Secondary | ICD-10-CM | POA: Diagnosis not present

## 2020-08-10 DIAGNOSIS — C61 Malignant neoplasm of prostate: Secondary | ICD-10-CM | POA: Insufficient documentation

## 2020-08-10 DIAGNOSIS — K449 Diaphragmatic hernia without obstruction or gangrene: Secondary | ICD-10-CM | POA: Diagnosis not present

## 2020-08-10 MED ORDER — TECHNETIUM TC 99M MEDRONATE IV KIT
20.0000 | PACK | Freq: Once | INTRAVENOUS | Status: AC | PRN
  Administered 2020-08-10: 21.8 via INTRAVENOUS

## 2020-09-03 ENCOUNTER — Other Ambulatory Visit: Payer: Self-pay | Admitting: Internal Medicine

## 2020-09-03 DIAGNOSIS — I10 Essential (primary) hypertension: Secondary | ICD-10-CM

## 2020-09-03 DIAGNOSIS — E785 Hyperlipidemia, unspecified: Secondary | ICD-10-CM

## 2020-09-14 ENCOUNTER — Other Ambulatory Visit: Payer: Self-pay | Admitting: Internal Medicine

## 2020-09-14 DIAGNOSIS — E1122 Type 2 diabetes mellitus with diabetic chronic kidney disease: Secondary | ICD-10-CM

## 2020-09-14 DIAGNOSIS — I10 Essential (primary) hypertension: Secondary | ICD-10-CM

## 2020-10-14 DIAGNOSIS — H35033 Hypertensive retinopathy, bilateral: Secondary | ICD-10-CM | POA: Diagnosis not present

## 2020-10-14 DIAGNOSIS — Z961 Presence of intraocular lens: Secondary | ICD-10-CM | POA: Diagnosis not present

## 2020-10-14 DIAGNOSIS — H52223 Regular astigmatism, bilateral: Secondary | ICD-10-CM | POA: Diagnosis not present

## 2020-10-14 DIAGNOSIS — H524 Presbyopia: Secondary | ICD-10-CM | POA: Diagnosis not present

## 2020-10-14 DIAGNOSIS — Z9849 Cataract extraction status, unspecified eye: Secondary | ICD-10-CM | POA: Diagnosis not present

## 2020-10-14 DIAGNOSIS — I10 Essential (primary) hypertension: Secondary | ICD-10-CM | POA: Diagnosis not present

## 2020-10-14 LAB — HM DIABETES EYE EXAM

## 2020-10-15 ENCOUNTER — Other Ambulatory Visit: Payer: Self-pay | Admitting: Internal Medicine

## 2020-10-15 DIAGNOSIS — I10 Essential (primary) hypertension: Secondary | ICD-10-CM

## 2020-10-15 DIAGNOSIS — N183 Chronic kidney disease, stage 3 unspecified: Secondary | ICD-10-CM

## 2020-11-09 ENCOUNTER — Other Ambulatory Visit: Payer: Self-pay

## 2020-11-10 ENCOUNTER — Ambulatory Visit (INDEPENDENT_AMBULATORY_CARE_PROVIDER_SITE_OTHER): Payer: Medicare Other | Admitting: Internal Medicine

## 2020-11-10 VITALS — BP 120/80 | HR 78 | Temp 98.2°F | Wt 232.2 lb

## 2020-11-10 DIAGNOSIS — E785 Hyperlipidemia, unspecified: Secondary | ICD-10-CM

## 2020-11-10 DIAGNOSIS — R0609 Other forms of dyspnea: Secondary | ICD-10-CM | POA: Diagnosis not present

## 2020-11-10 DIAGNOSIS — N1831 Chronic kidney disease, stage 3a: Secondary | ICD-10-CM

## 2020-11-10 DIAGNOSIS — C61 Malignant neoplasm of prostate: Secondary | ICD-10-CM

## 2020-11-10 DIAGNOSIS — E038 Other specified hypothyroidism: Secondary | ICD-10-CM

## 2020-11-10 DIAGNOSIS — N1832 Type 2 diabetes mellitus with diabetic chronic kidney disease: Secondary | ICD-10-CM

## 2020-11-10 DIAGNOSIS — E1122 Type 2 diabetes mellitus with diabetic chronic kidney disease: Secondary | ICD-10-CM | POA: Diagnosis not present

## 2020-11-10 DIAGNOSIS — I10 Essential (primary) hypertension: Secondary | ICD-10-CM | POA: Diagnosis not present

## 2020-11-10 LAB — POCT GLYCOSYLATED HEMOGLOBIN (HGB A1C): Hemoglobin A1C: 7 % — AB (ref 4.0–5.6)

## 2020-11-10 NOTE — Progress Notes (Signed)
Established Patient Office Visit     This visit occurred during the SARS-CoV-2 public health emergency.  Safety protocols were in place, including screening questions prior to the visit, additional usage of staff PPE, and extensive cleaning of exam room while observing appropriate contact time as indicated for disinfecting solutions.    CC/Reason for Visit: Follow-up chronic medical conditions  HPI: Rodney [name] is a 80 y.o. male who is coming in today for the above mentioned reasons. 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  He follows routinely with ophthalmology.  I have not seen him since August 2021.  He reports chronic dyspnea on exertion that has been ongoing for about 2 years.  In 2020 he had full cardiac work-up including heart catheterization that showed:  Left main, proximal LAD and proximal circumflex calcification noted on cine fluoroscopy. 25% distal left main. LAD is widely patent in the ostium.  Beyond the first septal perforator and diagonal contains diffuse 40% narrowing.  The first diagonal contains diffuse mid vessel 50 to 60% narrowing. Ostial circumflex contains eccentric calcified 30 to 40% narrowing.  Obtuse marginal branches are widely patent.  The third obtuse marginal is dominant of the 4 marginal branches. Left ventriculography was of poor quality.  Anterior wall moves normally.  EF is estimated to be greater than 50%.  LVEDP was normal at 15 mmHg.  He then had a 2D echocardiogram that showed normal LV and valve function.  It was determined by cardiology that this was likely noncardiac in origin.  He is a former smoker, has never had PFTs.   Past Medical/Surgical History: Past Medical History:  Diagnosis Date   Bladder calculus    CAD (coronary artery disease)    non obs    CKD (chronic kidney  disease), stage III    " i have 35-40% of my kidney function"; mgd by pcp    COLONIC POLYPS, HX OF 12/02/2008   DIABETES MELLITUS, TYPE II 07/19/2007   Dyspnea    reports sob over the last 2 years , underwent cath  in feb 2020 , reports cath was clean , referred to pulmonologist that stopped his Actos and encouraged weight loss  ; reports today minimum improvement in breathing , appt with pulm in June 2020   Erectile dysfunction    GERD (gastroesophageal reflux disease)    Glaucoma    GOUT 07/19/2007   History of kidney stones    HYPERLIPIDEMIA 12/02/2008   HYPERTENSION 07/19/2007   Kidney stones    Prostate cancer ([place])    Reiter syndrome    incomplete   Vitamin D deficiency     Past Surgical History:  Procedure Laterality Date   bladder neck dilation     CATARACT EXTRACTION     both eyes- 2015   COLONOSCOPY     CYSTOSCOPY N/A 06/11/2018   Procedure: CYSTOSCOPY WITH REMOVAL OF BLADDER CALCULUS;  Surgeon: [name], MD;  Location: [facility];  Service: Urology;  Laterality: N/A;   ELBOW SURGERY     bone spur - right   LEFT HEART CATH AND CORONARY ANGIOGRAPHY N/A 03/19/2018   Procedure: LEFT HEART CATH AND CORONARY ANGIOGRAPHY;  Surgeon: Belva Crome, MD;  Location: [place] [facility];  Service: Cardiovascular;  Laterality: N/A;   ORIF ANKLE FRACTURE Left 05/17/2015   Procedure: OPEN REDUCTION INTERNAL FIXATION (ORIF) ANKLE FRACTURE;  Surgeon: Meredith Pel, MD;  Location: [facility];  Service: Orthopedics;  Laterality: Left;   PROSTATECTOMY     URINARY SPHINCTER IMPLANT N/A 05/21/2019   Procedure: IMPLANTATION OF ARTIFICIAL URINARY SPHINCTER CYSTOSCOPY;  Surgeon: Bjorn Loser, MD;  Location: [facility];  Service: Urology;  Laterality: N/A;   VASECTOMY      Social History:  reports that he quit smoking about 42 years ago. His smoking use included cigarettes. He has a 30.00 pack-year smoking history. He has never used smokeless tobacco. He reports current alcohol use. He reports  that he does not use drugs.  Allergies: Allergies  Allergen Reactions   Ace Inhibitors     Tongue swelling. Reaction to benazepril only.     Family History:  Family History  Problem Relation Age of Onset   Cancer Sister        metastatic colon ca   Colon cancer Neg Hx    Esophageal cancer Neg Hx    Rectal cancer Neg Hx    Stomach cancer Neg Hx    Lung disease Neg Hx      Current Outpatient Medications:    amLODipine (NORVASC) 5 MG tablet, Take 1 tablet by mouth twice daily, Disp: 60 tablet, Rfl: 0   atorvastatin (LIPITOR) 20 MG tablet, TAKE 1 TABLET BY MOUTH AT BEDTIME, Disp: 90 tablet, Rfl: 0   dorzolamide-timolol (COSOPT) 22.3-6.8 MG/ML ophthalmic solution, Place 1 drop into both eyes See admin instructions. 1 drop into left eye twice daily & 1 drop into right eye in the morning, Disp: , Rfl:    glipiZIDE (GLUCOTROL) 5 MG tablet, TAKE 1 TABLET BY MOUTH ONCE DAILY BEFORE BREAKFAST, Disp: 30 tablet, Rfl: 0   glucose blood (ACCU-CHEK AVIVA) test strip, 1 each by Other route 2 (two) times daily., Disp: 100 each, Rfl: 3   latanoprost (XALATAN) 0.005 % ophthalmic solution, Place 1 drop into both eyes at bedtime. , Disp: , Rfl:    losartan (COZAAR) 50 MG tablet, Take 1 tablet by mouth once daily, Disp: 90 tablet, Rfl: 0  Review of Systems:  Constitutional: Denies fever, chills, diaphoresis, appetite change. HEENT: Denies photophobia, eye pain, redness, hearing loss, ear pain, congestion, sore throat, rhinorrhea, sneezing, mouth sores, trouble swallowing, neck pain, neck stiffness and tinnitus.   Respiratory: Denies  cough, chest tightness,  and wheezing.   Cardiovascular: Denies chest pain, palpitations and leg swelling.  Gastrointestinal: Denies nausea, vomiting, abdominal pain, diarrhea, constipation, blood in stool and abdominal distention.  Genitourinary: Denies dysuria, urgency, frequency, hematuria, flank pain and difficulty urinating.  Endocrine: Denies: hot or cold  intolerance, sweats, changes in hair or nails, polyuria, polydipsia. Musculoskeletal: Denies myalgias, back pain, joint swelling, arthralgias and gait problem.  Skin: Denies pallor, rash and wound.  Neurological: Denies dizziness, seizures, syncope, weakness, light-headedness, numbness and headaches.  Hematological: Denies adenopathy. Easy bruising, personal or family bleeding history  Psychiatric/Behavioral: Denies suicidal ideation, mood changes, confusion, nervousness, sleep disturbance and agitation    Physical Exam: Vitals:   11/10/20 1051  BP: 120/80  Pulse: 78  Temp: 98.2 F (36.8 C)  TempSrc: Oral  SpO2: 97%  Weight: 232 lb 3.2 oz (105.3 kg)    Body mass index is 35.31 kg/m.   Constitutional: NAD, calm, comfortable Eyes: PERRL, lids and conjunctivae normal, wears corrective lenses ENMT: Mucous membranes are moist.  Respiratory: clear to auscultation bilaterally, no wheezing, no crackles. Normal respiratory effort. No accessory muscle use.  Cardiovascular: Regular rate and rhythm, no murmurs / rubs /  gallops. No extremity edema. Neurologic: CN 2-12 grossly intact. Sensation intact, DTR normal. Strength 5/5 in all 4.  Psychiatric: Normal judgment and insight. Alert and oriented x 3. Normal mood.    Impression and Plan:  Type 2 diabetes mellitus with stage 3b chronic kidney disease, without long-term current use of insulin ([place])  - Plan: POCT glycosylated hemoglobin (Hb A1C) -A1c demonstrates good control at 7.0.  DOE (dyspnea on exertion) -Since he had a complete cardiac work-up in 2020 with relatively normal results, dyspnea on exertion has not really become progressive but is persistent over that timeframe, he is a former smoker, I will refer to pulmonology.  He will probably benefit from PFTs to rule out COPD. -EKG done in office today and interpreted by myself as: Normal sinus rhythm at a rate of 76, left axis deviation, poor R wave progression over precordial  leads, no acute changes.  Subclinical hypothyroidism -Recheck thyroid function test when he returns for physical.  Stage 3a chronic kidney disease ([place]) -Last known creatinine was 1.30 in 2022, he is followed by nephrology.  Essential hypertension -Blood pressure is well controlled.  Dyslipidemia -Last lipid panel in August 2021 with a total cholesterol of 108, triglycerides 140 and LDL 61.  He is on atorvastatin daily.  Prostate cancer [facility]) -Followed by urology, last PSA was 17.4 in June 2022.  Time spent: 35 minutes reviewing chart, interviewing and examining patient and formulating plan of care.   Patient Instructions  -Nice seeing you today!!  -Schedule follow up in 3 months for your physical. Please come in fasting that day.  -Will send you to see a lung doctor about your shortness of breath as you have had a complete cardiac work up as of 2020.    Lelon Frohlich, MD [facility] at [facility]

## 2020-11-10 NOTE — Patient Instructions (Signed)
-  Nice seeing you today!!  -Schedule follow up in 3 months for your physical. Please come in fasting that day.  -Will send you to see a lung doctor about your shortness of breath as you have had a complete cardiac work up as of 2020.

## 2020-11-13 ENCOUNTER — Other Ambulatory Visit: Payer: Self-pay | Admitting: Internal Medicine

## 2020-11-13 DIAGNOSIS — I10 Essential (primary) hypertension: Secondary | ICD-10-CM

## 2020-11-13 DIAGNOSIS — E1122 Type 2 diabetes mellitus with diabetic chronic kidney disease: Secondary | ICD-10-CM

## 2020-11-13 DIAGNOSIS — N183 Chronic kidney disease, stage 3 unspecified: Secondary | ICD-10-CM

## 2020-12-03 ENCOUNTER — Other Ambulatory Visit: Payer: Self-pay | Admitting: Internal Medicine

## 2020-12-03 DIAGNOSIS — I10 Essential (primary) hypertension: Secondary | ICD-10-CM

## 2020-12-03 DIAGNOSIS — E785 Hyperlipidemia, unspecified: Secondary | ICD-10-CM

## 2021-02-15 ENCOUNTER — Other Ambulatory Visit: Payer: Self-pay | Admitting: Internal Medicine

## 2021-02-15 ENCOUNTER — Encounter: Payer: Self-pay | Admitting: Internal Medicine

## 2021-02-15 ENCOUNTER — Ambulatory Visit (INDEPENDENT_AMBULATORY_CARE_PROVIDER_SITE_OTHER): Payer: Medicare Other | Admitting: Internal Medicine

## 2021-02-15 VITALS — BP 134/70 | HR 75 | Temp 97.8°F | Ht 67.72 in | Wt 228.0 lb

## 2021-02-15 DIAGNOSIS — Z1211 Encounter for screening for malignant neoplasm of colon: Secondary | ICD-10-CM | POA: Diagnosis not present

## 2021-02-15 DIAGNOSIS — I10 Essential (primary) hypertension: Secondary | ICD-10-CM

## 2021-02-15 DIAGNOSIS — Z0000 Encounter for general adult medical examination without abnormal findings: Secondary | ICD-10-CM | POA: Diagnosis not present

## 2021-02-15 DIAGNOSIS — Z86010 Personal history of colonic polyps: Secondary | ICD-10-CM

## 2021-02-15 DIAGNOSIS — E038 Other specified hypothyroidism: Secondary | ICD-10-CM | POA: Diagnosis not present

## 2021-02-15 DIAGNOSIS — E1122 Type 2 diabetes mellitus with diabetic chronic kidney disease: Secondary | ICD-10-CM

## 2021-02-15 DIAGNOSIS — N1832 Chronic kidney disease, stage 3b: Secondary | ICD-10-CM

## 2021-02-15 DIAGNOSIS — E669 Obesity, unspecified: Secondary | ICD-10-CM

## 2021-02-15 DIAGNOSIS — C61 Malignant neoplasm of prostate: Secondary | ICD-10-CM | POA: Diagnosis not present

## 2021-02-15 DIAGNOSIS — R0609 Other forms of dyspnea: Secondary | ICD-10-CM

## 2021-02-15 DIAGNOSIS — N1831 Chronic kidney disease, stage 3a: Secondary | ICD-10-CM | POA: Diagnosis not present

## 2021-02-15 DIAGNOSIS — E559 Vitamin D deficiency, unspecified: Secondary | ICD-10-CM

## 2021-02-15 DIAGNOSIS — E785 Hyperlipidemia, unspecified: Secondary | ICD-10-CM | POA: Diagnosis not present

## 2021-02-15 LAB — LIPID PANEL
Cholesterol: 110 mg/dL (ref 0–200)
HDL: 26.6 mg/dL — ABNORMAL LOW (ref >39.00)
LDL Cholesterol: 55 mg/dL (ref 0–99)
NonHDL: 83.27
Total CHOL/HDL Ratio: 4
Triglycerides: 141 mg/dL (ref 0.0–149.0)
VLDL: 28.2 mg/dL (ref 0.0–40.0)

## 2021-02-15 LAB — TSH: TSH: 4.29 u[IU]/mL (ref 0.35–5.50)

## 2021-02-15 LAB — CBC WITH DIFFERENTIAL/PLATELET
Basophils Absolute: 0.1 10*3/uL (ref 0.0–0.1)
Basophils Relative: 0.7 % (ref 0.0–3.0)
Eosinophils Absolute: 0.2 10*3/uL (ref 0.0–0.7)
Eosinophils Relative: 3.3 % (ref 0.0–5.0)
HCT: 40.9 % (ref 39.0–52.0)
Hemoglobin: 13.6 g/dL (ref 13.0–17.0)
Lymphocytes Relative: 15.5 % (ref 12.0–46.0)
Lymphs Abs: 1.1 10*3/uL (ref 0.7–4.0)
MCHC: 33.3 g/dL (ref 30.0–36.0)
MCV: 96.9 fl (ref 78.0–100.0)
Monocytes Absolute: 0.6 10*3/uL (ref 0.1–1.0)
Monocytes Relative: 8.4 % (ref 3.0–12.0)
Neutro Abs: 5.2 10*3/uL (ref 1.4–7.7)
Neutrophils Relative %: 72.1 % (ref 43.0–77.0)
Platelets: 260 10*3/uL (ref 150.0–400.0)
RBC: 4.22 Mil/uL (ref 4.22–5.81)
RDW: 14 % (ref 11.5–15.5)
WBC: 7.2 10*3/uL (ref 4.0–10.5)

## 2021-02-15 LAB — COMPREHENSIVE METABOLIC PANEL
ALT: 24 U/L (ref 0–53)
AST: 19 U/L (ref 0–37)
Albumin: 4.5 g/dL (ref 3.5–5.2)
Alkaline Phosphatase: 94 U/L (ref 39–117)
BUN: 16 mg/dL (ref 6–23)
CO2: 25 mEq/L (ref 19–32)
Calcium: 9.7 mg/dL (ref 8.4–10.5)
Chloride: 105 mEq/L (ref 96–112)
Creatinine, Ser: 1.53 mg/dL — ABNORMAL HIGH (ref 0.40–1.50)
GFR: 42.62 mL/min — ABNORMAL LOW (ref >60.00)
Glucose, Bld: 127 mg/dL — ABNORMAL HIGH (ref 70–99)
Potassium: 4.4 mEq/L (ref 3.5–5.1)
Sodium: 139 mEq/L (ref 135–145)
Total Bilirubin: 0.8 mg/dL (ref 0.2–1.2)
Total Protein: 7.4 g/dL (ref 6.0–8.3)

## 2021-02-15 LAB — T3, FREE: T3, Free: 3.1 pg/mL (ref 2.3–4.2)

## 2021-02-15 LAB — T4, FREE: Free T4: 1.06 ng/dL (ref 0.60–1.60)

## 2021-02-15 LAB — VITAMIN D 25 HYDROXY (VIT D DEFICIENCY, FRACTURES): VITD: 27.69 ng/mL — ABNORMAL LOW (ref 30.00–100.00)

## 2021-02-15 LAB — VITAMIN B12: Vitamin B-12: 398 pg/mL (ref 211–911)

## 2021-02-15 LAB — HEMOGLOBIN A1C: Hgb A1c MFr Bld: 7 % — ABNORMAL HIGH (ref 4.6–6.5)

## 2021-02-15 MED ORDER — VITAMIN D (ERGOCALCIFEROL) 1.25 MG (50000 UNIT) PO CAPS
50000.0000 [IU] | ORAL_CAPSULE | ORAL | 0 refills | Status: AC
Start: 2021-02-15 — End: 2021-05-04

## 2021-02-15 NOTE — Patient Instructions (Signed)
-  Nice seeing you today!!  -Lab work today; will notify you once results are available.  -Consider your COVID booster, flu, shingles and tdap vaccines at the pharmacy.  -Remember we need to visit the GI doctor for a repeat colonoscopy.  -Schedule follow up in 3 months.

## 2021-02-15 NOTE — Progress Notes (Signed)
Established Patient Office Visit     This visit occurred during the SARS-CoV-2 public health emergency.  Safety protocols were in place, including screening questions prior to the visit, additional usage of staff PPE, and extensive cleaning of exam room while observing appropriate contact time as indicated for disinfecting solutions.    CC/Reason for Visit: Annual preventive exam and subsequent Medicare wellness visit  HPI: Rodney [name] is a 81 y.o. male who is coming in today for the above mentioned reasons. Past Medical History is significant for: Hypertension, type 2 diabetes, hyperlipidemia, chronic kidney disease stage III, coronary artery disease and a Reiter's syndrome with uveitis/iritis.  He has been following routinely with ophthalmology, has not had a dental exam in years.  No perceived hearing issues.  He also has a history of colon polyps and his last colonoscopy in 2016 demonstrated 8 polyps and a 5-year follow-up was advised, he is now overdue.  He is overdue for flu, COVID booster, shingles and Tdap vaccines.  He also has a history of prostate cancer remotely with a recently rising PSA followed by urology, they are considering hormone therapy.  He has an appointment with pulmonary scheduled for this week due to chronic dyspnea on exertion.   Past Medical/Surgical History: Past Medical History:  Diagnosis Date   Bladder calculus    CAD (coronary artery disease)    non obs    CKD (chronic kidney disease), stage III    " i have 35-40% of my kidney function"; mgd by pcp    COLONIC POLYPS, HX OF 12/02/2008   DIABETES MELLITUS, TYPE II 07/19/2007   Dyspnea    reports sob over the last 2 years , underwent cath  in feb 2020 , reports cath was clean , referred to pulmonologist that stopped his Actos and encouraged weight loss  ; reports today minimum improvement in breathing , appt with pulm in June 2020   Erectile dysfunction    GERD (gastroesophageal reflux disease)     Glaucoma    GOUT 07/19/2007   History of kidney stones    HYPERLIPIDEMIA 12/02/2008   HYPERTENSION 07/19/2007   Kidney stones    Prostate cancer ([place])    Reiter syndrome    incomplete   Vitamin D deficiency     Past Surgical History:  Procedure Laterality Date   bladder neck dilation     CATARACT EXTRACTION     both eyes- 2015   COLONOSCOPY     CYSTOSCOPY N/A 06/11/2018   Procedure: CYSTOSCOPY WITH REMOVAL OF BLADDER CALCULUS;  Surgeon: [name], MD;  [facility];  Service: Urology;  Laterality: N/A;   ELBOW SURGERY     bone spur - right   LEFT HEART CATH AND CORONARY ANGIOGRAPHY N/A 03/19/2018   Procedure: LEFT HEART CATH AND CORONARY ANGIOGRAPHY;  Surgeon: Belva Crome, MD;  Location: [place] [facility];  Service: Cardiovascular;  Laterality: N/A;   ORIF ANKLE FRACTURE Left 05/17/2015   Procedure: OPEN REDUCTION INTERNAL FIXATION (ORIF) ANKLE FRACTURE;  Surgeon: Meredith Pel, MD;  [facility];  Service: Orthopedics;  Laterality: Left;   PROSTATECTOMY     URINARY SPHINCTER IMPLANT N/A 05/21/2019   Procedure: IMPLANTATION OF ARTIFICIAL URINARY SPHINCTER CYSTOSCOPY;  Surgeon: Bjorn Loser, MD;  [facility];  Service: Urology;  Laterality: N/A;   VASECTOMY      Social History:  reports that he quit smoking about 43 years ago. His smoking use included cigarettes. He has a  30.00 pack-year smoking history. He has never used smokeless tobacco. He reports current alcohol use. He reports that he does not use drugs.  Allergies: Allergies  Allergen Reactions   Ace Inhibitors     Tongue swelling. Reaction to benazepril only.     Family History:  Family History  Problem Relation Age of Onset   Cancer Sister        metastatic colon ca   Colon cancer Neg Hx    Esophageal cancer Neg Hx    Rectal cancer Neg Hx    Stomach cancer Neg Hx    Lung disease Neg Hx      Current Outpatient Medications:    amLODipine (NORVASC) 5 MG tablet, TAKE 1 TABLET BY  MOUTH TWICE DAILY . APPOINTMENT REQUIRED FOR FUTURE REFILLS, Disp: 180 tablet, Rfl: 0   atorvastatin (LIPITOR) 20 MG tablet, TAKE 1 TABLET BY MOUTH AT BEDTIME . APPOINTMENT REQUIRED FOR FUTURE REFILLS, Disp: 90 tablet, Rfl: 0   dorzolamide-timolol (COSOPT) 22.3-6.8 MG/ML ophthalmic solution, Place 1 drop into both eyes See admin instructions. 1 drop into left eye twice daily & 1 drop into right eye in the morning, Disp: , Rfl:    glipiZIDE (GLUCOTROL) 5 MG tablet, TAKE 1 TABLET BY MOUTH ONCE DAILY BEFORE BREAKFAST . APPOINTMENT REQUIRED FOR FUTURE REFILLS, Disp: 90 tablet, Rfl: 0   glucose blood (ACCU-CHEK AVIVA) test strip, 1 each by Other route 2 (two) times daily., Disp: 100 each, Rfl: 3   latanoprost (XALATAN) 0.005 % ophthalmic solution, Place 1 drop into both eyes at bedtime. , Disp: , Rfl:    losartan (COZAAR) 50 MG tablet, Take 1 tablet by mouth once daily, Disp: 90 tablet, Rfl: 0  Review of Systems:  Constitutional: Denies fever, chills, diaphoresis, appetite change and fatigue.  HEENT: Denies photophobia, eye pain, redness, hearing loss, ear pain, congestion, sore throat, rhinorrhea, sneezing, mouth sores, trouble swallowing, neck pain, neck stiffness and tinnitus.   Respiratory: Denies  cough, chest tightness,  and wheezing.   Cardiovascular: Denies chest pain, palpitations and leg swelling.  Gastrointestinal: Denies nausea, vomiting, abdominal pain, diarrhea, constipation, blood in stool and abdominal distention.  Genitourinary: Denies dysuria, urgency, frequency, hematuria, flank pain and difficulty urinating.  Endocrine: Denies: hot or cold intolerance, sweats, changes in hair or nails, polyuria, polydipsia. Musculoskeletal: Denies myalgias, back pain, joint swelling, arthralgias and gait problem.  Skin: Denies pallor, rash and wound.  Neurological: Denies dizziness, seizures, syncope, weakness, light-headedness, numbness and headaches.  Hematological: Denies adenopathy. Easy  bruising, personal or family bleeding history  Psychiatric/Behavioral: Denies suicidal ideation, mood changes, confusion, nervousness, sleep disturbance and agitation    Physical Exam: Vitals:   02/15/21 0730  BP: 134/70  Pulse: 75  Temp: 97.8 F (36.6 C)  TempSrc: Oral  SpO2: 98%  Weight: 228 lb (103.4 kg)  Height: 5' 7.72" (1.72 m)    Body mass index is 34.96 kg/m.   Constitutional: NAD, calm, comfortable Eyes: PERRL, lids and conjunctivae normal, wears corrective lenses ENMT: Mucous membranes are moist. Posterior pharynx clear of any exudate or lesions. Normal dentition. Tympanic membrane is pearly white, no erythema or bulging. Neck: normal, supple, no masses, no thyromegaly Respiratory: clear to auscultation bilaterally, no wheezing, no crackles. Normal respiratory effort. No accessory muscle use.  Cardiovascular: Regular rate and rhythm, no murmurs / rubs / gallops. No extremity edema. 2+ pedal pulses. No carotid bruits.  Abdomen: no tenderness, no masses palpated. No hepatosplenomegaly. Bowel sounds positive.  Musculoskeletal: no clubbing / cyanosis. No  joint deformity upper and lower extremities. Good ROM, no contractures. Normal muscle tone.  Skin: no rashes, lesions, ulcers. No induration Neurologic: CN 2-12 grossly intact. Sensation intact, DTR normal. Strength 5/5 in all 4.  Psychiatric: Normal judgment and insight. Alert and oriented x 3. Normal mood.    Subsequent Medicare wellness visit   1. Risk factors, based on past  M,S,F -cardiovascular disease risk factors include age, gender, history is of hypertension, hyperlipidemia, diabetes, previous known coronary artery disease   2.  Physical activities: Very sedentary   3.  Depression/mood: Stable, not depressed   4.  Hearing: No perceived issues   5.  ADL's: Independent in all ADLs   6.  Fall risk: Low fall risk   7.  Home safety: No problems identified   8.  Height weight, and visual acuity: height and  weight as above, vision: He declined vision exam today as he follows with ophthalmology every 3 months due to his history of uveitis.   9.  Counseling: Advised he update his vaccination status and colonoscopy   10. Lab orders based on risk factors: Laboratory update will be reviewed   11. Referral : GI   12. Care plan: Follow-up with me in 3 months   13. Cognitive assessment: No cognitive impairment   14. Screening: Patient provided with a written and personalized 5-10 year screening schedule in the AVS. yes   15. Provider List Update: PCP, GI, pulmonary, ophthalmology  11. Advance Directives: Full code  17. Opioids: Patient is not on any opioid prescriptions and has no risk factors for a substance use disorder.   [place] Office Visit from 11/10/2020 in [place] at [place]  PHQ-9 Total Score 0       Fall Risk 05/21/2019 05/21/2019 05/22/2019 09/11/2019 11/10/2020  Falls in the past year? - - - 0 0  Was there an injury with Fall? - - - 0 0  Was there an injury with Fall? - - - - -  Fall Risk Category Calculator - - - 0 0  Fall Risk Category - - - Low Low  Patient Fall Risk Level High fall risk High fall risk Moderate fall risk - -  Patient Fall Risk Level s/p - - - -     Impression and Plan:  Encounter for preventive health examination -Recommend routine eye and dental care. -Immunizations: Overdue for flu, COVID booster, Tdap, shingles, declines all vaccines today -Healthy lifestyle discussed in detail. -Labs to be updated today. -Colon cancer screening: 11/2014, overdue for 3 to 5-year callback, sent back to GI -Breast cancer screening: Not applicable -Cervical cancer screening: Not applicable -Lung cancer screening: Not applicable this quit smoking approximately 35 years ago -Prostate cancer screening: History of prostate cancer followed by urology -DEXA: Not applicable  Subclinical hypothyroidism  - Plan: TSH, T4, free, T3, free  Dyspnea on  exertion  -Will be seeing pulmonary this week.  Prostate cancer ([place]) -Followed by urology with a rising PSA.  Dyslipidemia  - Plan: Lipid panel -On daily atorvastatin.  Essential hypertension  - Plan: CBC with Differential/Platelet, Comprehensive metabolic panel -Fair control  History of colonic polyps -Refer back to GI for colonoscopy.  Type 2 diabetes mellitus with stage 3b chronic kidney disease, without long-term current use of insulin ([place])  - Plan: Hemoglobin A1c  Stage 3a chronic kidney disease ([place]) -Follow renal function today, last creatinine was 1.3 in July 2022  Obesity (BMI 30.0-34.9) -Discussed healthy lifestyle, including increased physical activity and better  food choices to promote weight loss.    Patient Instructions  -Nice seeing you today!!  -Lab work today; will notify you once results are available.  -Consider your COVID booster, flu, shingles and tdap vaccines at the pharmacy.  -Remember we need to visit the GI doctor for a repeat colonoscopy.  -Schedule follow up in 3 months.      Rodney Frohlich, MD [facility] at [facility]

## 2021-02-16 NOTE — Progress Notes (Deleted)
Synopsis: Referred for DOE by Isaac Bliss, [place]*  Subjective:   PATIENT ID: Rodney [name] GENDER: male DOB: 1940-02-16, MRN: [id number]  No chief complaint on file.  26yM with history of HTN, DM2, CKD3, CAD, reactive arthritis (uveitis/iritis), obesity, 30 py smoking quit 43ya, covid-19 infection who is referred for DOE.  Otherwise pertinent review of systems is negative.  Past Medical History:  Diagnosis Date   Bladder calculus    CAD (coronary artery disease)    non obs    CKD (chronic kidney disease), stage III    " i have 35-40% of my kidney function"; mgd by pcp    COLONIC POLYPS, HX OF 12/02/2008   DIABETES MELLITUS, TYPE II 07/19/2007   Dyspnea    reports sob over the last 2 years , underwent cath  in feb 2020 , reports cath was clean , referred to pulmonologist that stopped his Actos and encouraged weight loss  ; reports today minimum improvement in breathing , appt with pulm in June 2020   Erectile dysfunction    GERD (gastroesophageal reflux disease)    Glaucoma    GOUT 07/19/2007   History of kidney stones    HYPERLIPIDEMIA 12/02/2008   HYPERTENSION 07/19/2007   Kidney stones    Prostate cancer ([place])    Reiter syndrome    incomplete   Vitamin D deficiency      Family History  Problem Relation Age of Onset   Cancer Sister        metastatic colon ca   Colon cancer Neg Hx    Esophageal cancer Neg Hx    Rectal cancer Neg Hx    Stomach cancer Neg Hx    Lung disease Neg Hx      Past Surgical History:  Procedure Laterality Date   bladder neck dilation     CATARACT EXTRACTION     both eyes- 2015   COLONOSCOPY     CYSTOSCOPY N/A 06/11/2018   Procedure: CYSTOSCOPY WITH REMOVAL OF BLADDER CALCULUS;  Surgeon: [name], MD;  Location: [facility];  Service: Urology;  Laterality: N/A;   ELBOW SURGERY     bone spur - right   LEFT HEART CATH AND CORONARY ANGIOGRAPHY N/A 03/19/2018   Procedure: LEFT HEART CATH AND CORONARY ANGIOGRAPHY;  Surgeon: Belva Crome, MD;  Location: [facility];  Service: Cardiovascular;  Laterality: N/A;   ORIF ANKLE FRACTURE Left 05/17/2015   Procedure: OPEN REDUCTION INTERNAL FIXATION (ORIF) ANKLE FRACTURE;  Surgeon: Meredith Pel, MD;  Location: [facility];  Service: Orthopedics;  Laterality: Left;   PROSTATECTOMY     URINARY SPHINCTER IMPLANT N/A 05/21/2019   Procedure: IMPLANTATION OF ARTIFICIAL URINARY SPHINCTER CYSTOSCOPY;  Surgeon: Bjorn Loser, MD;  Location: [facility];  Service: Urology;  Laterality: N/A;   VASECTOMY      Social History   Socioeconomic History   Marital status: Married    Spouse name: Not on file   Number of children: Not on file   Years of education: Not on file   Highest education level: Not on file  Occupational History   Not on file  Tobacco Use   Smoking status: Former    Packs/day: 1.00    Years: 30.00    Pack years: 30.00    Types: Cigarettes    Quit date: 01/24/1978    Years since quitting: 43.0   Smokeless tobacco: Never  Vaping Use   Vaping Use: Never used  Substance and Sexual Activity  Alcohol use: Yes    Comment: occasionally -socially   Drug use: No   Sexual activity: Not on file    Comment: 2 childern  Other Topics Concern   Not on file  Social History Narrative   Not on file   Social Determinants of Health   Financial Resource Strain: Not on file  Food Insecurity: Not on file  Transportation Needs: Not on file  Physical Activity: Not on file  Stress: Not on file  Social Connections: Not on file  Intimate Partner Violence: Not on file     Allergies  Allergen Reactions   Ace Inhibitors     Tongue swelling. Reaction to benazepril only.      Outpatient Medications Prior to Visit  Medication Sig Dispense Refill   amLODipine (NORVASC) 5 MG tablet TAKE 1 TABLET BY MOUTH TWICE DAILY . APPOINTMENT REQUIRED FOR FUTURE REFILLS 180 tablet 0   atorvastatin (LIPITOR) 20 MG tablet TAKE 1 TABLET BY MOUTH AT BEDTIME . APPOINTMENT REQUIRED FOR  FUTURE REFILLS 90 tablet 0   dorzolamide-timolol (COSOPT) 22.3-6.8 MG/ML ophthalmic solution Place 1 drop into both eyes See admin instructions. 1 drop into left eye twice daily & 1 drop into right eye in the morning     glipiZIDE (GLUCOTROL) 5 MG tablet TAKE 1 TABLET BY MOUTH ONCE DAILY BEFORE BREAKFAST . APPOINTMENT REQUIRED FOR FUTURE REFILLS 90 tablet 0   glucose blood (ACCU-CHEK AVIVA) test strip 1 each by Other route 2 (two) times daily. 100 each 3   latanoprost (XALATAN) 0.005 % ophthalmic solution Place 1 drop into both eyes at bedtime.      losartan (COZAAR) 50 MG tablet Take 1 tablet by mouth once daily 90 tablet 0   Vitamin D, Ergocalciferol, (DRISDOL) 1.25 MG (50000 UNIT) CAPS capsule Take 1 capsule (50,000 Units total) by mouth every 7 (seven) days for 12 doses. 12 capsule 0   No facility-administered medications prior to visit.       Objective:   Physical Exam:  General appearance: 81 y.o., male, NAD, conversant  Eyes: anicteric sclerae; PERRL, tracking appropriately HENT: NCAT; MMM Neck: Trachea midline; no lymphadenopathy, no JVD Lungs: CTAB, no crackles, no wheeze, with normal respiratory effort CV: RRR, no murmur  Abdomen: Soft, non-tender; non-distended, BS present  Extremities: No peripheral edema, warm Skin: Normal turgor and texture; no rash Psych: Appropriate affect Neuro: Alert and oriented to person and place, no focal deficit     There were no vitals filed for this visit.   on *** LPM *** RA BMI Readings from Last 3 Encounters:  02/15/21 34.96 kg/m  11/10/20 35.31 kg/m  09/11/19 35.03 kg/m   Wt Readings from Last 3 Encounters:  02/15/21 228 lb (103.4 kg)  11/10/20 232 lb 3.2 oz (105.3 kg)  09/11/19 230 lb 6.4 oz (104.5 kg)     CBC    Component Value Date/Time   WBC 7.2 02/15/2021 0807   RBC 4.22 02/15/2021 0807   HGB 13.6 02/15/2021 0807   HGB 11.6 (L) 03/13/2018 0844   HCT 40.9 02/15/2021 0807   HCT 34.9 (L) 03/13/2018 0844   PLT  260.0 02/15/2021 0807   PLT 280 03/13/2018 0844   MCV 96.9 02/15/2021 0807   MCV 100 (H) 03/13/2018 0844   MCH 32.9 09/11/2019 0932   MCHC 33.3 02/15/2021 0807   RDW 14.0 02/15/2021 0807   RDW 12.3 03/13/2018 0844   LYMPHSABS 1.1 02/15/2021 0807   MONOABS 0.6 02/15/2021 0807   EOSABS 0.2 02/15/2021  6837   BASOSABS 0.1 02/15/2021 0807   BMP, CBC stable  TSH wnl  Chest Imaging: CXR 04/2019 reviewed by me unremarkable  Pulmonary Functions Testing Results: No flowsheet data found.  Echocardiogram:   TTE 2020 with mild diastolic dysfunction/impaired relaxation  Heart Catheterization:   LHC 2020: Left main, proximal LAD and proximal circumflex calcification noted on cine fluoroscopy. 25% distal left main. LAD is widely patent in the ostium.  Beyond the first septal perforator and diagonal contains diffuse 40% narrowing.  The first diagonal contains diffuse mid vessel 50 to 60% narrowing. Ostial circumflex contains eccentric calcified 30 to 40% narrowing.  Obtuse marginal branches are widely patent.  The third obtuse marginal is dominant of the 4 marginal branches. Left ventriculography was of poor quality.  Anterior wall moves normally.  EF is estimated to be greater than 50%.  LVEDP was normal at 15 mmHg.   RECOMMENDATIONS:   Dyspnea on exertion is of uncertain etiology.  He needs to have a 2D Doppler echocardiogram done to assess LV thickness and overall wall motion.  The study will also allow assessment of RV and pulmonary artery pressures. Needs an exercise treadmill test to rule out chronotropic incompetence or severe hypertensive blood pressure response that could account for exertional fatigue and dyspnea. Needs aggressive secondary risk factor modification to prevent progression of underlying disease as noted above.    Assessment & Plan:   # DOE: Likely multifactorial. At risk for COPD. Has fairly significant nCAD on last LHC. Could additionally have worsened diatolic  dysfunction, deconditioning. Untreated sleep-disordered breathing could play role.  Plan: - TTE - PFTs  - home sleep study     Maryjane Hurter, MD [facility] 02/16/2021 12:59 PM

## 2021-02-17 ENCOUNTER — Ambulatory Visit: Payer: Medicare Other | Admitting: Internal Medicine

## 2021-02-17 ENCOUNTER — Ambulatory Visit (INDEPENDENT_AMBULATORY_CARE_PROVIDER_SITE_OTHER): Payer: Medicare Other

## 2021-02-17 ENCOUNTER — Other Ambulatory Visit: Payer: Self-pay

## 2021-02-17 ENCOUNTER — Encounter: Payer: Self-pay | Admitting: Internal Medicine

## 2021-02-17 ENCOUNTER — Institutional Professional Consult (permissible substitution): Payer: Medicare Other | Admitting: Student

## 2021-02-17 VITALS — BP 142/60 | HR 79 | Temp 98.3°F | Ht 68.0 in | Wt 227.6 lb

## 2021-02-17 DIAGNOSIS — R0609 Other forms of dyspnea: Secondary | ICD-10-CM

## 2021-02-17 DIAGNOSIS — R0602 Shortness of breath: Secondary | ICD-10-CM | POA: Diagnosis not present

## 2021-02-17 NOTE — Progress Notes (Signed)
Rodney [name], male    DOB: Nov 27, 1940   MRN: [id number]   Brief patient profile:  22 yowm quit smoking 1980  At wt 150 baseline s obvious sequelae with indolent onset progressive doe x spring 2019 s assoc cough/ cp so underwent cards w/u with  03/19/18  LHC  With no critical CAD and lvedp 15 and ETT  03/29/18 with moderately impaired ex tol and echo 04/02/18 ok RV, some diastolic dysfunction and referred to pulmonary clinic 05/23/2018 by Rodney Merle NP   [facility]  09/08/2015  =  236   History of Present Illness  05/23/2018  Pulmonary/ 1st office eval/Rodney [name]  Chief Complaint  Patient presents with   Pulmonary Consult    Referred by Rodney Merle, NP.  Pt c/o DOE "with doing anything physically" x 1 yr  Dyspnea:  MMRC1 = can walk nl pace, flat grade, can't hurry or go uphills or steps s sob   Could do recumbant bike before the gym closed for covid19  tol up to 30 min but only 3 min on treadmill x flat surface ? pace Cough: no Sleep: on side / one pillow SABA use: none  No regular walking since coronovirus  Rec Weight control is simply a matter of calorie balance   Be sure the list you have today corresponds 100% to the medications you are taking  Please schedule a follow up office visit in 6 weeks, call sooner if needed with all medications /inhalers/ solutions in hand so we can verify exactly what you are taking. This includes all medications from all doctors and over the counters  - PFTs on return> not done as of 02/17/2021    02/17/2021  f/u ov/Rodney [name] re: doe   maint on no resp / gerd rx   Chief Complaint  Patient presents with   Follow-up    Ref. For cough (saw Rodney [name] 2020)-sob gradually got worse with exertion, dry cough  Dyspnea:  treadmill 3 x times per week  8-10 min feels fine also fine on recumbent eliptical Cough: only in am with sneezing for a few min then none the rest of theday  Sleeping: bed is flat/ one pillow SABA use: none  02: none  Covid status:   vax x 3    No obvious day  to day or daytime variability or assoc excess/ purulent sputum or mucus plugs or hemoptysis or cp or chest tightness, subjective wheeze or overt sinus or hb symptoms.   Sleeping as above  without nocturnal  or early am exacerbation  of respiratory  c/o's or need for noct saba. Also denies any obvious fluctuation of symptoms with weather or environmental changes or other aggravating or alleviating factors except as outlined above   No unusual exposure hx or h/o childhood pna/ asthma or knowledge of premature birth.  Current Allergies, Complete Past Medical History, Past Surgical History, Family History, and Social History were reviewed in [employer] record.  ROS  The following are not active complaints unless bolded Hoarseness, sore throat, dysphagia, dental problems, itching, sneezing,  nasal congestion just first thing on stirring in am or discharge of excess mucus or purulent secretions, ear ache,   fever, chills, sweats, unintended wt loss or wt gain, classically pleuritic or exertional cp,  orthopnea pnd or arm/hand swelling  or leg swelling, presyncope, palpitations, abdominal pain, anorexia, nausea, vomiting, diarrhea  or change in bowel habits or change in bladder habits, change in stools or change in urine, dysuria, hematuria,  rash, arthralgias, visual complaints, headache, numbness, weakness or ataxia or problems with walking or coordination,  change in mood or  memory.        Current Meds  Medication Sig   amLODipine (NORVASC) 5 MG tablet TAKE 1 TABLET BY MOUTH TWICE DAILY . APPOINTMENT REQUIRED FOR FUTURE REFILLS   atorvastatin (LIPITOR) 20 MG tablet TAKE 1 TABLET BY MOUTH AT BEDTIME . APPOINTMENT REQUIRED FOR FUTURE REFILLS   dorzolamide-timolol (COSOPT) 22.3-6.8 MG/ML ophthalmic solution Place 1 drop into both eyes See admin instructions. 1 drop into left eye twice daily & 1 drop into right eye in the morning   glipiZIDE (GLUCOTROL) 5 MG tablet TAKE 1 TABLET BY  MOUTH ONCE DAILY BEFORE BREAKFAST . APPOINTMENT REQUIRED FOR FUTURE REFILLS   glucose blood (ACCU-CHEK AVIVA) test strip 1 each by Other route 2 (two) times daily.   latanoprost (XALATAN) 0.005 % ophthalmic solution Place 1 drop into both eyes at bedtime.    losartan (COZAAR) 50 MG tablet Take 1 tablet by mouth once daily   Vitamin D, Ergocalciferol, (DRISDOL) 1.25 MG (50000 UNIT) CAPS capsule Take 1 capsule (50,000 Units total) by mouth every 7 (seven) days for 12 doses.         Past Medical History:  Diagnosis Date   COLONIC POLYPS, HX OF 12/02/2008   DIABETES MELLITUS, TYPE II 07/19/2007   Erectile dysfunction    GERD (gastroesophageal reflux disease)    Glaucoma    GOUT 07/19/2007   HYPERLIPIDEMIA 12/02/2008   HYPERTENSION 07/19/2007   Kidney stones    Prostate cancer ([place])    Reiter syndrome    incomplete   Vitamin D deficiency          Objective:     Wt Readings from Last 3 Encounters:  02/17/21 227 lb 9.6 oz (103.2 kg)  02/15/21 228 lb (103.4 kg)  11/10/20 232 lb 3.2 oz (105.3 kg)      Vital signs reviewed  02/17/2021  - Note at rest 02 sats  99% on RA   General appearance:    amb mod obese wm nad/ mild pseudowheeze    HEENT : pt wearing mask not removed for exam due to covid -19 concerns.    NECK :  without JVD/Nodes/TM/ nl carotid upstrokes bilaterally   LUNGS: no acc muscle use,  Nl contour chest which is clear to A and P bilaterally without cough on insp or exp maneuvers   CV:  RRR  no s3 or murmur or increase in P2, and no edema   ABD: quite obese but soft and nontender with nl inspiratory excursion in the supine position. No bruits or organomegaly appreciated, bowel sounds nl  MS:  Nl gait/ ext warm without deformities, calf tenderness, cyanosis or clubbing No obvious joint restrictions   SKIN: warm and dry without lesions    NEURO:  alert, approp, nl sensorium with  no motor or cerebellar deficits apparent.        CXR PA and Lateral:   02/17/2021  :    I personally reviewed images and impression is as follows:     No acute or active dz apparent          Assessment

## 2021-02-17 NOTE — Assessment & Plan Note (Addendum)
Quit smoking 1980 at wt around 150 lb ? Onset sping 2019  /24/20  LHC  With no critical CAD and lvedp 15  ETT  03/29/18 with moderately impaired ex tol  Echo 04/02/18  ok RV, some diastolic dysfunction - 7/51/7001  @wt   =232    Walked RA  2 laps @  approx 246ft each @ fast pace  stopped due to end of study min Sob with sats 98%   - 02/17/2021  @ wt 227  Walked on RA  x  2  lap(s) =  approx 500  ft  @ fast pace, stopped due to end of study  with lowest 02 sats 98% and no doe    I strongly suspect obesity deconditioning as previously advised - the problem is that he did not apparently understand submaximal wt bearing ex which was my rec and now finds it more difficult to do things like haul a wheelbarrel across a hard when he wt bearing and lifting at the same time   Will need pfts then cpst (both his ex and his ex test need to be while wt bearing so treadmill is good for both)  to   sort out the various components of this problem  - likely none is  pulmonary in origin so unlikely to need f/u in this clinic   Each maintenance medication was reviewed in detail including emphasizing most importantly the difference between maintenance and prns and under what circumstances the prns are to be triggered using an action plan format where appropriate.  Total time for H and P, chart review, counseling,  directly observing portions of ambulatory 02 saturation study/ and generating customized AVS unique to this office visit / same day charting > 30 min with pt not seen in almost 3 y in this clinic

## 2021-02-17 NOTE — Patient Instructions (Addendum)
To get the most out of exercise, you need to be continuously aware that you are short of breath, but never out of breath, for at least 30 minutes daily. As you improve, it will actually be easier for you to do the same amount of exercise  in  30 minutes so always push to the level where you are short of breath.  Once you can do this, push for longer duration or repeat it after at least 4 hours of rest. This has to be done at least 3 x per week - more if you can.  If not convinced you are improving after a month, call to schedule CPST  We will walk you today for a baseline  Please remember to go to the  x-ray department  for your tests - we will call you with the results when they are available    PFT's need to be scheduled here w/in month or so  - follow upwill be as needed

## 2021-02-18 ENCOUNTER — Encounter: Payer: Self-pay | Admitting: Internal Medicine

## 2021-02-18 ENCOUNTER — Other Ambulatory Visit: Payer: Self-pay | Admitting: Internal Medicine

## 2021-02-18 DIAGNOSIS — I10 Essential (primary) hypertension: Secondary | ICD-10-CM

## 2021-02-23 ENCOUNTER — Other Ambulatory Visit: Payer: Self-pay | Admitting: Internal Medicine

## 2021-02-23 DIAGNOSIS — N183 Chronic kidney disease, stage 3 unspecified: Secondary | ICD-10-CM

## 2021-02-23 DIAGNOSIS — E1122 Type 2 diabetes mellitus with diabetic chronic kidney disease: Secondary | ICD-10-CM

## 2021-03-01 ENCOUNTER — Other Ambulatory Visit: Payer: Self-pay | Admitting: Internal Medicine

## 2021-03-01 ENCOUNTER — Other Ambulatory Visit: Payer: Self-pay | Admitting: *Deleted

## 2021-03-01 DIAGNOSIS — I10 Essential (primary) hypertension: Secondary | ICD-10-CM

## 2021-03-01 DIAGNOSIS — E785 Hyperlipidemia, unspecified: Secondary | ICD-10-CM

## 2021-03-01 MED ORDER — AMLODIPINE BESYLATE 5 MG PO TABS: 5.0000 mg | ORAL_TABLET | Freq: Two times a day (BID) | ORAL | 1 refills | Status: DC

## 2021-03-04 ENCOUNTER — Emergency Department (HOSPITAL_COMMUNITY): Payer: Medicare Other

## 2021-03-04 ENCOUNTER — Observation Stay (HOSPITAL_COMMUNITY)
Admission: EM | Admit: 2021-03-04 | Discharge: 2021-03-05 | Disposition: A | Discharge status: Home / Self Care | Payer: Medicare Other | Attending: Internal Medicine | Admitting: Internal Medicine

## 2021-03-04 ENCOUNTER — Observation Stay (HOSPITAL_BASED_OUTPATIENT_CLINIC_OR_DEPARTMENT_OTHER): Payer: Medicare Other

## 2021-03-04 ENCOUNTER — Observation Stay (HOSPITAL_COMMUNITY): Payer: Medicare Other

## 2021-03-04 ENCOUNTER — Encounter (HOSPITAL_COMMUNITY): Payer: Self-pay | Admitting: Internal Medicine

## 2021-03-04 ENCOUNTER — Other Ambulatory Visit: Payer: Self-pay

## 2021-03-04 DIAGNOSIS — I672 Cerebral atherosclerosis: Secondary | ICD-10-CM | POA: Diagnosis not present

## 2021-03-04 DIAGNOSIS — K219 Gastro-esophageal reflux disease without esophagitis: Secondary | ICD-10-CM | POA: Insufficient documentation

## 2021-03-04 DIAGNOSIS — Z7902 Long term (current) use of antithrombotics/antiplatelets: Secondary | ICD-10-CM | POA: Diagnosis not present

## 2021-03-04 DIAGNOSIS — R29810 Facial weakness: Secondary | ICD-10-CM | POA: Diagnosis not present

## 2021-03-04 DIAGNOSIS — I10 Essential (primary) hypertension: Secondary | ICD-10-CM

## 2021-03-04 DIAGNOSIS — Z7984 Long term (current) use of oral hypoglycemic drugs: Secondary | ICD-10-CM | POA: Insufficient documentation

## 2021-03-04 DIAGNOSIS — N1832 Chronic kidney disease, stage 3b: Secondary | ICD-10-CM | POA: Diagnosis not present

## 2021-03-04 DIAGNOSIS — G459 Transient cerebral ischemic attack, unspecified: Secondary | ICD-10-CM | POA: Diagnosis not present

## 2021-03-04 DIAGNOSIS — R202 Paresthesia of skin: Secondary | ICD-10-CM | POA: Diagnosis present

## 2021-03-04 DIAGNOSIS — I639 Cerebral infarction, unspecified: Secondary | ICD-10-CM | POA: Diagnosis not present

## 2021-03-04 DIAGNOSIS — N183 Chronic kidney disease, stage 3 unspecified: Secondary | ICD-10-CM | POA: Diagnosis present

## 2021-03-04 DIAGNOSIS — Z7982 Long term (current) use of aspirin: Secondary | ICD-10-CM | POA: Insufficient documentation

## 2021-03-04 DIAGNOSIS — R6889 Other general symptoms and signs: Secondary | ICD-10-CM | POA: Diagnosis not present

## 2021-03-04 DIAGNOSIS — J320 Chronic maxillary sinusitis: Secondary | ICD-10-CM | POA: Diagnosis not present

## 2021-03-04 DIAGNOSIS — Z743 Need for continuous supervision: Secondary | ICD-10-CM | POA: Diagnosis not present

## 2021-03-04 DIAGNOSIS — Z79899 Other long term (current) drug therapy: Secondary | ICD-10-CM | POA: Insufficient documentation

## 2021-03-04 DIAGNOSIS — R4781 Slurred speech: Secondary | ICD-10-CM | POA: Diagnosis not present

## 2021-03-04 DIAGNOSIS — I6523 Occlusion and stenosis of bilateral carotid arteries: Secondary | ICD-10-CM | POA: Diagnosis not present

## 2021-03-04 DIAGNOSIS — E785 Hyperlipidemia, unspecified: Secondary | ICD-10-CM | POA: Diagnosis not present

## 2021-03-04 DIAGNOSIS — Z20822 Contact with and (suspected) exposure to covid-19: Secondary | ICD-10-CM | POA: Diagnosis not present

## 2021-03-04 DIAGNOSIS — J3489 Other specified disorders of nose and nasal sinuses: Secondary | ICD-10-CM | POA: Diagnosis not present

## 2021-03-04 DIAGNOSIS — Z87891 Personal history of nicotine dependence: Secondary | ICD-10-CM | POA: Insufficient documentation

## 2021-03-04 DIAGNOSIS — Z8546 Personal history of malignant neoplasm of prostate: Secondary | ICD-10-CM | POA: Diagnosis not present

## 2021-03-04 DIAGNOSIS — E1122 Type 2 diabetes mellitus with diabetic chronic kidney disease: Secondary | ICD-10-CM | POA: Diagnosis present

## 2021-03-04 DIAGNOSIS — R41841 Cognitive communication deficit: Secondary | ICD-10-CM | POA: Insufficient documentation

## 2021-03-04 DIAGNOSIS — R2990 Unspecified symptoms and signs involving the nervous system: Secondary | ICD-10-CM

## 2021-03-04 DIAGNOSIS — I1310 Hypertensive heart and chronic kidney disease without heart failure, with stage 1 through stage 4 chronic kidney disease, or unspecified chronic kidney disease: Secondary | ICD-10-CM | POA: Diagnosis not present

## 2021-03-04 DIAGNOSIS — R4701 Aphasia: Secondary | ICD-10-CM | POA: Diagnosis not present

## 2021-03-04 DIAGNOSIS — I517 Cardiomegaly: Secondary | ICD-10-CM | POA: Diagnosis not present

## 2021-03-04 LAB — CBC WITH DIFFERENTIAL/PLATELET
Abs Immature Granulocytes: 0.04 10*3/uL (ref 0.00–0.07)
Basophils Absolute: 0 10*3/uL (ref 0.0–0.1)
Basophils Relative: 0 %
Eosinophils Absolute: 0.1 10*3/uL (ref 0.0–0.5)
Eosinophils Relative: 2 %
HCT: 40.1 % (ref 39.0–52.0)
Hemoglobin: 13.3 g/dL (ref 13.0–17.0)
Immature Granulocytes: 1 %
Lymphocytes Relative: 12 %
Lymphs Abs: 0.9 10*3/uL (ref 0.7–4.0)
MCH: 33.1 pg (ref 26.0–34.0)
MCHC: 33.2 g/dL (ref 30.0–36.0)
MCV: 99.8 fL (ref 80.0–100.0)
Monocytes Absolute: 0.5 10*3/uL (ref 0.1–1.0)
Monocytes Relative: 7 %
Neutro Abs: 5.6 10*3/uL (ref 1.7–7.7)
Neutrophils Relative %: 78 %
Platelets: 226 10*3/uL (ref 150–400)
RBC: 4.02 MIL/uL — ABNORMAL LOW (ref 4.22–5.81)
RDW: 13.1 % (ref 11.5–15.5)
WBC: 7.2 10*3/uL (ref 4.0–10.5)
nRBC: 0 % (ref 0.0–0.2)

## 2021-03-04 LAB — COMPREHENSIVE METABOLIC PANEL
ALT: 25 U/L (ref 0–44)
AST: 21 U/L (ref 15–41)
Albumin: 4 g/dL (ref 3.5–5.0)
Alkaline Phosphatase: 85 U/L (ref 38–126)
Anion gap: 11 (ref 5–15)
BUN: 18 mg/dL (ref 8–23)
CO2: 22 mmol/L (ref 22–32)
Calcium: 9.2 mg/dL (ref 8.9–10.3)
Chloride: 105 mmol/L (ref 98–111)
Creatinine, Ser: 1.74 mg/dL — ABNORMAL HIGH (ref 0.61–1.24)
GFR, Estimated: 39 mL/min — ABNORMAL LOW (ref >60)
Glucose, Bld: 171 mg/dL — ABNORMAL HIGH (ref 70–99)
Potassium: 4.2 mmol/L (ref 3.5–5.1)
Sodium: 138 mmol/L (ref 135–145)
Total Bilirubin: 1.2 mg/dL (ref 0.3–1.2)
Total Protein: 6.6 g/dL (ref 6.5–8.1)

## 2021-03-04 LAB — GLUCOSE, CAPILLARY
Glucose-Capillary: 238 mg/dL — ABNORMAL HIGH (ref 70–99)
Glucose-Capillary: 194 mg/dL — ABNORMAL HIGH (ref 70–99)

## 2021-03-04 LAB — ECHOCARDIOGRAM COMPLETE
AV Peak grad: 6.2 mmHg
Ao pk vel: 1.24 m/s
Height: 68 in
S' Lateral: 2.4 cm
Weight: 3600 oz

## 2021-03-04 LAB — RESP PANEL BY RT-PCR (FLU A&B, COVID) ARPGX2
Influenza A by PCR: NEGATIVE
Influenza B by PCR: NEGATIVE
SARS Coronavirus 2 by RT PCR: NEGATIVE

## 2021-03-04 LAB — CBG MONITORING, ED: Glucose-Capillary: 131 mg/dL — ABNORMAL HIGH (ref 70–99)

## 2021-03-04 LAB — PROTIME-INR
INR: 1 (ref 0.8–1.2)
Prothrombin Time: 13.1 seconds (ref 11.4–15.2)

## 2021-03-04 LAB — HEMOGLOBIN A1C
Hgb A1c MFr Bld: 6.5 % — ABNORMAL HIGH (ref 4.8–5.6)
Mean Plasma Glucose: 139.85 mg/dL

## 2021-03-04 MED ORDER — IOHEXOL 350 MG/ML SOLN
100.0000 mL | Freq: Once | INTRAVENOUS | Status: AC | PRN
  Administered 2021-03-04: 100 mL via INTRAVENOUS

## 2021-03-04 MED ORDER — CLOPIDOGREL BISULFATE 75 MG PO TABS
75.0000 mg | ORAL_TABLET | Freq: Every day | ORAL | Status: DC
  Administered 2021-03-04 – 2021-03-05 (×2): 75 mg via ORAL
  Filled 2021-03-04 (×2): qty 1

## 2021-03-04 MED ORDER — STROKE: EARLY STAGES OF RECOVERY BOOK
Freq: Once | Status: AC
  Filled 2021-03-04: qty 1

## 2021-03-04 MED ORDER — DORZOLAMIDE HCL-TIMOLOL MAL 2-0.5 % OP SOLN
1.0000 [drp] | Freq: Every morning | OPHTHALMIC | Status: DC
  Filled 2021-03-04: qty 10

## 2021-03-04 MED ORDER — ATORVASTATIN CALCIUM 10 MG PO TABS
20.0000 mg | ORAL_TABLET | Freq: Every day | ORAL | Status: DC
  Administered 2021-03-04: 20 mg via ORAL
  Filled 2021-03-04: qty 2

## 2021-03-04 MED ORDER — SODIUM CHLORIDE 0.9 % IV SOLN: INTRAVENOUS | Status: DC

## 2021-03-04 MED ORDER — ACETAMINOPHEN 325 MG PO TABS: 650.0000 mg | ORAL_TABLET | ORAL | Status: DC | PRN

## 2021-03-04 MED ORDER — SENNOSIDES-DOCUSATE SODIUM 8.6-50 MG PO TABS: 1.0000 | ORAL_TABLET | Freq: Every evening | ORAL | Status: DC | PRN

## 2021-03-04 MED ORDER — INSULIN ASPART 100 UNIT/ML IJ SOLN
0.0000 [IU] | Freq: Three times a day (TID) | INTRAMUSCULAR | Status: DC
  Administered 2021-03-04: 3 [IU] via SUBCUTANEOUS
  Administered 2021-03-05 (×2): 1 [IU] via SUBCUTANEOUS

## 2021-03-04 MED ORDER — ENOXAPARIN SODIUM 40 MG/0.4ML IJ SOSY
40.0000 mg | PREFILLED_SYRINGE | INTRAMUSCULAR | Status: DC
  Administered 2021-03-04 – 2021-03-05 (×2): 40 mg via SUBCUTANEOUS
  Filled 2021-03-04 (×2): qty 0.4

## 2021-03-04 MED ORDER — ACETAMINOPHEN 650 MG RE SUPP: 650.0000 mg | RECTAL | Status: DC | PRN

## 2021-03-04 MED ORDER — ACETAMINOPHEN 160 MG/5ML PO SOLN: 650.0000 mg | ORAL | Status: DC | PRN

## 2021-03-04 MED ORDER — ASPIRIN 325 MG PO TABS
325.0000 mg | ORAL_TABLET | Freq: Once | ORAL | Status: AC
  Administered 2021-03-04: 325 mg via ORAL
  Filled 2021-03-04: qty 1

## 2021-03-04 MED ORDER — ASPIRIN 81 MG PO CHEW
81.0000 mg | CHEWABLE_TABLET | Freq: Every day | ORAL | Status: DC
  Administered 2021-03-05: 81 mg via ORAL
  Filled 2021-03-04: qty 1

## 2021-03-04 NOTE — ED Triage Notes (Signed)
Pt arrived via [facility] from home with c/c of stroke symptoms. Per EMS pt wake between 0530-0600 went to bathroom for Bm and was unable to get up due to right side (leg and arm) paralysis, facial droop, and slurred speech. By time ems arrived all symptoms had resolved.   170/88, CBG 156, 98%RA, 78HR,

## 2021-03-04 NOTE — Assessment & Plan Note (Addendum)
Presented with transient right sided weakness, dysarthria, word finding difficulty and right facial droop. Symptoms reportedly lasted less than an hour and resolved without recurrence. Likely small vessel disease etiology.  However per neurology, cannot rule out embolic to right MCA and recommend 30-day heart monitor.  I have contacted [facility] to arrange same. CTA head and neck: No acute intracranial hemorrhage.  Small vessel disease. Plaque at the proximal internal carotids causes less than 50% stenosis.Plaque along the left greater than right intracranial vertebral arteries. There is up to marked stenosis on the left. MRI brain: No acute stroke. 2D echo: Preserved LVEF. LDL 58, at goal.  A1c 6.5, at goal. Therapies evaluation: Have no recommendations for home. Not on antithrombotics prior to admission, now on aspirin 325 mg x 1 dose followed by 81 mg daily + Plavix 75 mg daily x3 weeks followed by aspirin alone. Neurology consultation appreciated.  Communicated with Dr. Erlinda Hong, has cleared patient for discharge and will arrange outpatient follow-up. Outpatient follow-up with neurology.

## 2021-03-04 NOTE — ED Provider Notes (Signed)
[facility] EMERGENCY DEPARTMENT Provider Note   CSN: [id number] Arrival date & time: 03/04/21  2951     History  Chief Complaint  Patient presents with   Stroke Symptoms    Rodney [name] is a 81 y.o. male.  HPI  81 year old male with past medical history of HTN, HLD, DM, CAD, CKD presents emergency department after resolved episode of right-sided weakness, facial droop and speech difficulty.  Patient reportedly has been in his usual state of health.  Went to bed last night around 10 PM.  Believes that he use the restroom around 2 AM without difficulty.  Woke up at 6 AM this morning and was unable to get out of bed due to right upper and lower extremity weakness/numbness.  When he called for his wife she noted facial droop and the patient was having difficult time expressing words.  EMS was called, fingerstick was appropriate.  EMS noted right-sided facial droop on arrival.  In route patient's symptoms resolved and he now feels back to baseline.  Denies any headache or neck pain.  No recent trauma, new medications, infection.  No chest pain.  Home Medications Prior to Admission medications   Medication Sig Start Date End Date Taking? Authorizing Provider  amLODipine (NORVASC) 5 MG tablet Take 1 tablet (5 mg total) by mouth 2 (two) times daily. 03/01/21  Yes Isaac Bliss, Rayford Halsted, MD  atorvastatin (LIPITOR) 20 MG tablet TAKE 1 TABLET BY MOUTH AT BEDTIME . APPOINTMENT REQUIRED FOR FUTURE REFILLS Patient taking differently: Take 20 mg by mouth at bedtime. 03/01/21  Yes Isaac Bliss, Rayford Halsted, MD  dorzolamide-timolol (COSOPT) 22.3-6.8 MG/ML ophthalmic solution Place 1 drop into both eyes every morning. 09/20/13  Yes [provider]  glipiZIDE (GLUCOTROL) 5 MG tablet Take 1 tablet (5 mg total) by mouth daily before breakfast. 02/24/21  Yes Isaac Bliss, Rayford Halsted, MD  losartan (COZAAR) 50 MG tablet Take 1 tablet by mouth once daily Patient taking  differently: Take 50 mg by mouth at bedtime. 03/01/21  Yes Isaac Bliss, Rayford Halsted, MD  Vitamin D, Ergocalciferol, (DRISDOL) 1.25 MG (50000 UNIT) CAPS capsule Take 1 capsule (50,000 Units total) by mouth every 7 (seven) days for 12 doses. Patient taking differently: Take 50,000 Units by mouth every Friday. 02/15/21 05/04/21 Yes Erline Hau, MD  glucose blood (ACCU-CHEK AVIVA) test strip 1 each by Other route 2 (two) times daily. 06/24/11   Marletta Lor, MD      Allergies    Ace inhibitors    Review of Systems   Review of Systems  Constitutional:  Negative for fever.  Eyes:  Negative for visual disturbance.  Respiratory:  Negative for shortness of breath.   Cardiovascular:  Negative for chest pain.  Gastrointestinal:  Negative for abdominal pain, diarrhea and vomiting.  Skin:  Negative for rash.  Neurological:  Positive for facial asymmetry, speech difficulty, weakness and numbness. Negative for headaches.   Physical Exam Updated Vital Signs BP 121/84    Pulse 94    Temp 97.7 F (36.5 C) (Oral)    Resp 17    Ht 5\' 8"  (1.727 m)    Wt 102.1 kg    SpO2 98%    BMI 34.21 kg/m  Physical Exam Vitals and nursing note reviewed.  Constitutional:      General: He is not in acute distress.    Appearance: Normal appearance.  HENT:     Head: Normocephalic.     Mouth/Throat:  Mouth: Mucous membranes are moist.  Eyes:     Extraocular Movements: Extraocular movements intact.     Pupils: Pupils are equal, round, and reactive to light.  Cardiovascular:     Rate and Rhythm: Normal rate.  Pulmonary:     Effort: Pulmonary effort is normal. No respiratory distress.  Abdominal:     Palpations: Abdomen is soft.     Tenderness: There is no abdominal tenderness.  Skin:    General: Skin is warm.  Neurological:     General: No focal deficit present.     Mental Status: He is alert and oriented to person, place, and time. Mental status is at baseline.     Cranial Nerves: No  cranial nerve deficit.     Comments: NIH0  Psychiatric:        Mood and Affect: Mood normal.    ED Results / Procedures / Treatments   Labs (all labs ordered are listed, but only abnormal results are displayed) Labs Reviewed  CBC WITH DIFFERENTIAL/PLATELET - Abnormal; Notable for the following components:      Result Value   RBC 4.02 (*)    All other components within normal limits  COMPREHENSIVE METABOLIC PANEL - Abnormal; Notable for the following components:   Glucose, Bld 171 (*)    Creatinine, Ser 1.74 (*)    GFR, Estimated 39 (*)    All other components within normal limits  RESP PANEL BY RT-PCR (FLU A&B, COVID) ARPGX2  PROTIME-INR    EKG None  Radiology CT ANGIO HEAD NECK W WO CM  Result Date: 03/04/2021 CLINICAL DATA:  Neuro deficit, acute, stroke suspected; resolved right sided weakness, facial droop and aphasia EXAM: CT ANGIOGRAPHY HEAD AND NECK TECHNIQUE: Multidetector CT imaging of the head and neck was performed using the standard protocol during bolus administration of intravenous contrast. Multiplanar CT image reconstructions and MIPs were obtained to evaluate the vascular anatomy. Carotid stenosis measurements (when applicable) are obtained utilizing NASCET criteria, using the distal internal carotid diameter as the denominator. RADIATION DOSE REDUCTION: This exam was performed according to the departmental dose-optimization program which includes automated exposure control, adjustment of the mA and/or kV according to patient size and/or use of iterative reconstruction technique. CONTRAST:  60 mL Isovue 370 COMPARISON:  None FINDINGS: CT HEAD Brain: There is no acute intracranial hemorrhage, mass effect, or edema. Gray-white differentiation is preserved. There are age-indeterminate small vessel infarcts of the central white matter and basal ganglia bilaterally. Additional hypoattenuation in the supratentorial white matter is nonspecific but probably reflects mild moderate  chronic microvascular ischemic changes. There is no extra-axial fluid collection. Ventricles and sulci are within normal limits in size and configuration. Vascular: No hyperdense vessel. Skull: Calvarium is unremarkable. Sinuses/Orbits: Patchy mucosal thickening. Evidence of chronic right maxillary sinus inflammation. Orbits are unremarkable. Other: None. CTA NECK FINDINGS Aortic arch: Mild plaque along the arch and patent great vessel origins. Right carotid system: Patent. Mixed plaque along the proximal internal carotid causing less than 50% stenosis. Left carotid system: Patent. Mixed plaque along the proximal internal carotid causing less than 50% stenosis. Vertebral arteries: Patent and codominant. Skeleton: Degenerative changes of the cervical spine. Other neck: Unremarkable. Upper chest: No apical lung mass. Review of the MIP images confirms the above findings CTA HEAD FINDINGS Anterior circulation: Intracranial internal carotid arteries are patent with mild calcified plaque. Anterior and middle cerebral arteries are patent. Posterior circulation: Intracranial vertebral arteries are patent. There is multifocal mixed plaque, greater on the left. Resulting in  up to marked stenosis on the distally. Basilar artery is patent. Major cerebellar artery origins are patent. Right posterior communicating artery is present. Posterior cerebral arteries are patent with atherosclerotic irregularity and mild stenosis, greater on the left. Venous sinuses: As permitted by contrast timing, patent. Review of the MIP images confirms the above findings IMPRESSION: No acute intracranial hemorrhage. Age-indeterminate small vessel infarcts of central white matter and basal bilaterally. Mild to moderate chronic microvascular ischemic changes. Plaque at the proximal internal carotids causes less than 50% stenosis. Plaque along the left greater than right intracranial vertebral arteries. There is up to marked stenosis on the left.  Atherosclerotic irregularity of posterior cerebral arteries. Electronically Signed   By: Macy Mis M.D.   On: 03/04/2021 11:12    Procedures Procedures    Medications Ordered in ED Medications  iohexol (OMNIPAQUE) 350 MG/ML injection 100 mL (100 mLs Intravenous Contrast Given 03/04/21 1056)    ED Course/ Medical Decision Making/ A&P                           Medical Decision Making Amount and/or Complexity of Data Reviewed Labs: ordered. Radiology: ordered.  Risk Prescription drug management.   This patient presents to the ED for concern of episode of right-sided weakness, facial droop and speech change, this involves an extensive number of treatment options, and is a complaint that carries with it a high risk of complications and morbidity.  The differential diagnosis includes TIA, CVA, near syncope   Additional history obtained: -Additional history obtained from wife at bedside -External records from outside source obtained and reviewed including: Chart review including previous notes, labs, imaging, consultation notes   Lab Tests: -I ordered, reviewed, and interpreted labs.  The pertinent results include: Baseline blood work, mild AKI which is baseline   EKG -Sinus rhythm   Imaging Studies ordered: -I ordered imaging studies including CT head, CTA head and neck -I independently visualized and interpreted imaging which showed pending at time of admission -I agree with the radiologist interpretation   Consultations Obtained: I requested consultation with the on call neurologist Dr. Leonel Ramsay,  and discussed lab and imaging findings as well as pertinent plan - they recommend: CTA imaging of the head and neck, medical admission for presumed TIA   ED Course: 81 year old male presents emergency department after resolved episode of right-sided weakness/numbness, facial droop and aphasia.  Resolved on arrival, current NIH is 0, vital stable on arrival.  No history of  previous TIA/CVA.  Wife and daughter at bedside.  Blood work is baseline, will plan for neuroimaging.  Given the concerning story of first-time TIA we will plan for medical admission for further medical and neuro evaluation.   Critical Interventions: Neurology consult and CT imaging   Cardiac Monitoring: The patient was maintained on a cardiac monitor.  I personally viewed and interpreted the cardiac monitored which showed an underlying rhythm of: Sinus rhythm   Reevaluation: After the interventions noted above, I reevaluated the patient and found that they have :resolved   Dispostion: Patients evaluation and results requires admission for further treatment and care.  Spoke with [facility] hospital[facility], reviewed patient's ED course and they accept admission.  Patient agrees with admission plan, offers no new complaints and is stable/unchanged at time of admit.        Final Clinical Impression(s) / ED Diagnoses Final diagnoses:  None    Rx / DC Orders ED Discharge Orders  None         Lorelle Gibbs, DO 03/04/21 1124

## 2021-03-04 NOTE — Assessment & Plan Note (Addendum)
A1c 6.5 Hold oral hypoglycemic while hospitalized. SSI and adjust insulins as needed. Resume Glucotrol at discharge.

## 2021-03-04 NOTE — Plan of Care (Signed)

## 2021-03-04 NOTE — H&P (Signed)
History and Physical    Rodney [name] SFK:[id number] DOB: 04-03-1940 DOA: 03/04/2021  PCP: Isaac Bliss, Rayford Halsted, MD   I have briefly reviewed patients previous medical reports in [facility].  Patient coming from: Home  Chief Complaint: Right-sided numbness, weakness, slurred speech, facial droop, transient  HPI: Rodney [name] is a 81 year old married male, independent, drives, medical history significant for type II DM, HTN, HLD, stage IIIb CKD, nonobstructive CAD, GERD and gout, presented to the [facility] ED on 03/04/2021 with complaints of right-sided numbness, weakness, slurred speech, facial droop.  He was in his usual state of health until last night.  He took a laxative and went to bed.  He woke up at around 2 AM on day of admission to use the restroom and had no complaints.  He then woke up at around 6 AM and went to the restroom without any complaints.  While he was sitting on the toilet, he noted sudden onset of right-sided numbness, weakness and inability to move his right side, slurred speech, inability to bring the right words out.  No headache or dizziness.  No history of LOC or falls.  He was able to get his wife's attention.  EMS was activated who noticed right facial droop.  He reports that his symptoms may have lasted anywhere between 45 minutes to an hour, spontaneously resolved without recurrence.  He denies prior such symptoms.  ED Course: Stable vital signs.  CTA head and neck without intracranial hemorrhage.  Labs significant for creatinine 1.74.  Neurology has been consulted.  Review of Systems:  All other systems reviewed and apart from HPI, are negative.  Past Medical History:  Diagnosis Date   Bladder calculus    CAD (coronary artery disease)    non obs    CKD (chronic kidney disease), stage III ([place])    " i have 35-40% of my kidney function"; mgd by pcp    COLONIC POLYPS, HX OF 12/02/2008   DIABETES MELLITUS, TYPE II 07/19/2007    Dyspnea    reports sob over the last 2 years , underwent cath  in feb 2020 , reports cath was clean , referred to pulmonologist that stopped his Actos and encouraged weight loss  ; reports today minimum improvement in breathing , appt with pulm in June 2020   Erectile dysfunction    GERD (gastroesophageal reflux disease)    Glaucoma    GOUT 07/19/2007   History of kidney stones    HYPERLIPIDEMIA 12/02/2008   HYPERTENSION 07/19/2007   Kidney stones    Prostate cancer ([place])    Reiter syndrome    incomplete   Vitamin D deficiency     Past Surgical History:  Procedure Laterality Date   bladder neck dilation     CATARACT EXTRACTION     both eyes- 2015   COLONOSCOPY     CYSTOSCOPY N/A 06/11/2018   Procedure: CYSTOSCOPY WITH REMOVAL OF BLADDER CALCULUS;  Surgeon: [name], MD;  Location: [facility];  Service: Urology;  Laterality: N/A;   ELBOW SURGERY     bone spur - right   LEFT HEART CATH AND CORONARY ANGIOGRAPHY N/A 03/19/2018   Procedure: LEFT HEART CATH AND CORONARY ANGIOGRAPHY;  Surgeon: Belva Crome, MD;  Location: [facility];  Service: Cardiovascular;  Laterality: N/A;   ORIF ANKLE FRACTURE Left 05/17/2015   Procedure: OPEN REDUCTION INTERNAL FIXATION (ORIF) ANKLE FRACTURE;  Surgeon: Meredith Pel, MD;  Location: [facility];  Service:  Orthopedics;  Laterality: Left;   PROSTATECTOMY     URINARY SPHINCTER IMPLANT N/A 05/21/2019   Procedure: IMPLANTATION OF ARTIFICIAL URINARY SPHINCTER CYSTOSCOPY;  Surgeon: Bjorn Loser, MD;  Location: [facility];  Service: Urology;  Laterality: N/A;   VASECTOMY      Social History  reports that he quit smoking about 43 years ago. His smoking use included cigarettes. He has a 30.00 pack-year smoking history. He has never used smokeless tobacco. He reports that he does not currently use alcohol. He reports that he does not use drugs.  Allergies  Allergen Reactions   Ace Inhibitors Swelling    Tongue swelling. Reaction to benazepril only.      Family History  Problem Relation Age of Onset   Cancer Sister        metastatic colon ca   Colon cancer Neg Hx    Esophageal cancer Neg Hx    Rectal cancer Neg Hx    Stomach cancer Neg Hx    Lung disease Neg Hx      Prior to Admission medications   Medication Sig Start Date End Date Taking? Authorizing Provider  amLODipine (NORVASC) 5 MG tablet Take 1 tablet (5 mg total) by mouth 2 (two) times daily. 03/01/21  Yes Isaac Bliss, Rayford Halsted, MD  atorvastatin (LIPITOR) 20 MG tablet TAKE 1 TABLET BY MOUTH AT BEDTIME . APPOINTMENT REQUIRED FOR FUTURE REFILLS Patient taking differently: Take 20 mg by mouth at bedtime. 03/01/21  Yes Isaac Bliss, Rayford Halsted, MD  dorzolamide-timolol (COSOPT) 22.3-6.8 MG/ML ophthalmic solution Place 1 drop into both eyes every morning. 09/20/13  Yes [provider]  glipiZIDE (GLUCOTROL) 5 MG tablet Take 1 tablet (5 mg total) by mouth daily before breakfast. 02/24/21  Yes Isaac Bliss, Rayford Halsted, MD  losartan (COZAAR) 50 MG tablet Take 1 tablet by mouth once daily Patient taking differently: Take 50 mg by mouth at bedtime. 03/01/21  Yes Isaac Bliss, Rayford Halsted, MD  Vitamin D, Ergocalciferol, (DRISDOL) 1.25 MG (50000 UNIT) CAPS capsule Take 1 capsule (50,000 Units total) by mouth every 7 (seven) days for 12 doses. Patient taking differently: Take 50,000 Units by mouth every Friday. 02/15/21 05/04/21 Yes Erline Hau, MD  glucose blood (ACCU-CHEK AVIVA) test strip 1 each by Other route 2 (two) times daily. 06/24/11   Marletta Lor, MD    Physical Exam: Vitals:   03/04/21 1045 03/04/21 1100 03/04/21 1115 03/04/21 1130  BP: 121/84 124/82 136/76 123/79  Pulse: 94 94 93 93  Resp: 17 (!) 7 11 12   Temp:      TempSrc:      SpO2: 98% 98% 99% 96%  Weight:      Height:          Constitutional: Elderly male, moderately built and nourished lying comfortably propped up in bed without distress.  Oral mucosa moist. Eyes: PERTLA,  lids and conjunctivae normal.  Bilateral intraocular lenses appreciated. ENMT: Mucous membranes are moist. Posterior pharynx clear of any exudate or lesions. Normal dentition.  Neck: supple, no masses, no thyromegaly Respiratory: Clear to auscultation without wheezing, rhonchi or crackles. No increased work of breathing. Cardiovascular: S1 & S2 heard, regular rate and rhythm. No JVD, murmurs, rubs or clicks. No pedal edema.  Telemetry personally reviewed: Sinus rhythm. Abdomen: Non distended. Non tender. Soft. No organomegaly or masses appreciated. No clinical Ascites. Normal bowel sounds heard. Musculoskeletal: no clubbing / cyanosis. No joint deformity upper and lower extremities. Good ROM, no contractures. Normal muscle tone.  Skin: no rashes, lesions, ulcers. No induration Neurologic: CN 2-12 grossly intact. Sensation intact, DTR normal. Strength 5/5 in all 4 limbs.  Psychiatric: Normal judgment and insight. Alert and oriented x 3. Normal mood.     Labs on Admission: I have personally reviewed following labs and imaging studies  CBC: Recent Labs  Lab 03/04/21 0842  WBC 7.2  NEUTROABS 5.6  HGB 13.3  HCT 40.1  MCV 99.8  PLT 295    Basic Metabolic Panel: Recent Labs  Lab 03/04/21 0842  NA 138  K 4.2  CL 105  CO2 22  GLUCOSE 171*  BUN 18  CREATININE 1.74*  CALCIUM 9.2    Liver Function Tests: Recent Labs  Lab 03/04/21 0842  AST 21  ALT 25  ALKPHOS 85  BILITOT 1.2  PROT 6.6  ALBUMIN 4.0     Radiological Exams on Admission: CT ANGIO HEAD NECK W WO CM  Result Date: 03/04/2021 CLINICAL DATA:  Neuro deficit, acute, stroke suspected; resolved right sided weakness, facial droop and aphasia EXAM: CT ANGIOGRAPHY HEAD AND NECK TECHNIQUE: Multidetector CT imaging of the head and neck was performed using the standard protocol during bolus administration of intravenous contrast. Multiplanar CT image reconstructions and MIPs were obtained to evaluate the vascular anatomy.  Carotid stenosis measurements (when applicable) are obtained utilizing NASCET criteria, using the distal internal carotid diameter as the denominator. RADIATION DOSE REDUCTION: This exam was performed according to the departmental dose-optimization program which includes automated exposure control, adjustment of the mA and/or kV according to patient size and/or use of iterative reconstruction technique. CONTRAST:  60 mL Isovue 370 COMPARISON:  None FINDINGS: CT HEAD Brain: There is no acute intracranial hemorrhage, mass effect, or edema. Gray-white differentiation is preserved. There are age-indeterminate small vessel infarcts of the central white matter and basal ganglia bilaterally. Additional hypoattenuation in the supratentorial white matter is nonspecific but probably reflects mild moderate chronic microvascular ischemic changes. There is no extra-axial fluid collection. Ventricles and sulci are within normal limits in size and configuration. Vascular: No hyperdense vessel. Skull: Calvarium is unremarkable. Sinuses/Orbits: Patchy mucosal thickening. Evidence of chronic right maxillary sinus inflammation. Orbits are unremarkable. Other: None. CTA NECK FINDINGS Aortic arch: Mild plaque along the arch and patent great vessel origins. Right carotid system: Patent. Mixed plaque along the proximal internal carotid causing less than 50% stenosis. Left carotid system: Patent. Mixed plaque along the proximal internal carotid causing less than 50% stenosis. Vertebral arteries: Patent and codominant. Skeleton: Degenerative changes of the cervical spine. Other neck: Unremarkable. Upper chest: No apical lung mass. Review of the MIP images confirms the above findings CTA HEAD FINDINGS Anterior circulation: Intracranial internal carotid arteries are patent with mild calcified plaque. Anterior and middle cerebral arteries are patent. Posterior circulation: Intracranial vertebral arteries are patent. There is multifocal mixed  plaque, greater on the left. Resulting in up to marked stenosis on the distally. Basilar artery is patent. Major cerebellar artery origins are patent. Right posterior communicating artery is present. Posterior cerebral arteries are patent with atherosclerotic irregularity and mild stenosis, greater on the left. Venous sinuses: As permitted by contrast timing, patent. Review of the MIP images confirms the above findings IMPRESSION: No acute intracranial hemorrhage. Age-indeterminate small vessel infarcts of central white matter and basal bilaterally. Mild to moderate chronic microvascular ischemic changes. Plaque at the proximal internal carotids causes less than 50% stenosis. Plaque along the left greater than right intracranial vertebral arteries. There is up to marked stenosis on the left. Atherosclerotic  irregularity of posterior cerebral arteries. Electronically Signed   By: Macy Mis M.D.   On: 03/04/2021 11:12    EKG: Independently reviewed.  Sinus rhythm with LAFB, 85 bpm, no acute ischemic changes, QTc 473 ms.  Assessment/Plan Principal Problem:   TIA (transient ischemic attack) Active Problems:   Type 2 diabetes mellitus with stage 3 chronic kidney disease (HCC)   Dyslipidemia   Essential hypertension   Chronic kidney disease (CKD), stage III (moderate) (HCC)    Assessment and Plan: * TIA (transient ischemic attack)- (present on admission) Presented with transient right sided weakness, dysarthria, word finding difficulty and right facial droop. Symptoms reportedly lasted less than an hour and resolved without recurrence. CTA head and neck: No acute intracranial hemorrhage.  Small vessel disease. Plaque at the proximal internal carotids causes less than 50% stenosis.Plaque along the left greater than right intracranial vertebral arteries. There is up to marked stenosis on the left. Complete TIA work-up: MRI brain without contrast, 2D echocardiogram, A1c, fasting lipids. Therapies  evaluation: PT, OT and SLP Not on antithrombotics prior to admission, now on aspirin 325 mg x 1 dose followed by 81 mg daily + Plavix 75 mg daily x3 weeks followed by aspirin alone. Neurology consulted and formal input pending. Outpatient follow-up with neurology.  Type 2 diabetes mellitus with stage 3 chronic kidney disease ([place])- (present on admission) A1c 7 on 02/15/2021 suggesting good control. Hold oral hypoglycemic while hospitalized. SSI and adjust insulins as needed.  Dyslipidemia- (present on admission) LDL 55 on 02/15/2021. Checking repeat fasting lipids. Continue prior home dose of statins.  Essential hypertension- (present on admission) Hold antihypertensives at this time to allow for permissive hypertension. Resume antihypertensives after 48 hours with eventual goal of normalization in 5 to 7 days. Treat if BP >220/120.  Chronic kidney disease (CKD), stage III (moderate) (HCC)- (present on admission) CKD stage IIIb Baseline creatinine fluctuates in the 1.5-1.8. Creatinine currently at baseline. Follow BMP in a.m.        DVT prophylaxis: Subcutaneous Lovenox Code Status: Full, confirmed with patient.  Reportedly spouse has his healthcare power of attorney. Family Communication: None at bedside Disposition Plan:   Patient is from:  Home  Anticipated DC to:  Home  Anticipated DC date:  03/05/2021  Anticipated DC barriers: None   Consults called: Neurology Admission status: Observation, telemetry  Severity of Illness: The appropriate patient status for this patient is OBSERVATION. Observation status is judged to be reasonable and necessary in order to provide the required intensity of service to ensure the patient's safety. The patient's presenting symptoms, physical exam findings, and initial radiographic and laboratory data in the context of their medical condition is felt to place them at decreased risk for further clinical deterioration. Furthermore, it is  anticipated that the patient will be medically stable for discharge from the hospital within 2 midnights of admission.     Vernell Leep MD [facility]  To contact the attending provider between 7A-7P or the covering provider during after hours 7P-7A, please log into the web site [email or web address] and access using universal [place] password for that web site. If you do not have the password, please call the hospital operator.

## 2021-03-04 NOTE — Evaluation (Addendum)
Occupational Therapy Evaluation Patient Details Name: Rodney [name] MRN: [id number] DOB: March 04, 1940 Today's Date: 03/04/2021   History of Present Illness 81 y.o. male PMHx of non-obstructive CAD, CKD stage 3, HTN, HLD, T2DM remote hx of prostate cancer presenting to [facility] due to right sided weakness, facial droop, and dysarthria.  MRI: No evidence of acute intracranial abnormality.  2. Small remote lacunar infarcts and mild-to-moderate chronic  microvascular disease.   Clinical Impression   Patient admitted for the diagnosis above.  At the time of his OT assessment, all symptoms have resolved, and he needed no assist for lower body ADL from sit/stand level, and was able to walk the halls of the ED unit without any assist.  It is assumed he will be undergoing additional testing, as he was being transported to ECHO, so OT can check on him tomorrow to ensure there are no changes to his status if admitted.  As of the eval, no post acute OT is anticipated.  Spouse can provide any oversight needed at home.  Patient is very active, and needed no assist for any aspect of mobility, ADL/IADL at home.  He is an avid fisherman.        Recommendations for follow up therapy are one component of a multi-disciplinary discharge planning process, led by the attending physician.  Recommendations may be updated based on patient status, additional functional criteria and insurance authorization.   Follow Up Recommendations  No OT follow up    Assistance Recommended at Discharge None  Patient can return home with the following      Functional Status Assessment  Patient has not had a recent decline in their functional status  Equipment Recommendations  None recommended by OT    Recommendations for Other Services       Precautions / Restrictions Precautions Precautions: Fall Restrictions Weight Bearing Restrictions: No      Mobility Bed Mobility Overal bed mobility: Independent                   Transfers Overall transfer level: Independent                 General transfer comment: walked ED unit pushing IV pole without assist.      Balance Overall balance assessment: No apparent balance deficits (not formally assessed)                                         ADL either performed or assessed with clinical judgement   ADL Overall ADL's : At baseline                                       General ADL Comments: No assist needed for donning gown like jacket, able to place PJ bottoms and socks without assist from sit/stand level.     Vision Baseline Vision/History: 1 Wears glasses Patient Visual Report: No change from baseline       Perception Perception Perception: Within Functional Limits   Praxis Praxis Praxis: Intact    Pertinent Vitals/Pain Pain Assessment Pain Assessment: No/denies pain     Hand Dominance Right   Extremity/Trunk Assessment Upper Extremity Assessment Upper Extremity Assessment: Overall WFL for tasks assessed   Lower Extremity Assessment Lower Extremity Assessment: Defer to PT evaluation   Cervical / Trunk Assessment  Cervical / Trunk Assessment: Normal   Communication Communication Communication: No difficulties   Cognition Arousal/Alertness: Awake/alert Behavior During Therapy: WFL for tasks assessed/performed Overall Cognitive Status: Within Functional Limits for tasks assessed                                       General Comments   VSS on RA    Exercises     Shoulder Instructions      Home Living Family/patient expects to be discharged to:: Private residence Living Arrangements: Spouse/significant other Available Help at Discharge: Family;Available 24 hours/day Type of Home: House Home Access: Stairs to enter [employer] of Steps: 3 Entrance Stairs-Rails: Right Home Layout: Laundry or work area in basement;Able to live on main level with  bedroom/bathroom     Bathroom Shower/Tub: Teacher, early years/pre: Standard Bathroom Accessibility: Yes How Accessible: Accessible via walker Home Equipment: None   Additional Comments: Very active, enjoys fishing.      Prior Functioning/Environment Prior Level of Function : Independent/Modified Independent;Driving                        OT Problem List: Other (comment);Decreased strength;Impaired balance (sitting and/or standing) (resolved at the time of eval)      OT Treatment/Interventions: Self-care/ADL training;Therapeutic activities;Balance training    OT Goals(Current goals can be found in the care plan section) Acute Rehab OT Goals Patient Stated Goal: Hoping to go home soon OT Goal Formulation: With patient Time For Goal Achievement: 03/11/21 Potential to Achieve Goals: Good  OT Frequency: Min 2X/week    Co-evaluation              AM-PAC OT "6 Clicks" Daily Activity     Outcome Measure Help from another person eating meals?: None Help from another person taking care of personal grooming?: None Help from another person toileting, which includes using toliet, bedpan, or urinal?: None Help from another person bathing (including washing, rinsing, drying)?: None Help from another person to put on and taking off regular upper body clothing?: None Help from another person to put on and taking off regular lower body clothing?: None 6 Click Score: 24   End of Session Nurse Communication: Mobility status  Activity Tolerance: Patient tolerated treatment well Patient left: in chair;with nursing/sitter in room  OT Visit Diagnosis: Hemiplegia and hemiparesis Hemiplegia - Right/Left: Right Hemiplegia - dominant/non-dominant: Dominant Hemiplegia - caused by: Cerebral infarction (Resolved at time of eval)                Time: 2878-6767 OT Time Calculation (min): 18 min Charges:  OT General Charges $OT Visit: 1 Visit OT Evaluation $OT Eval  Moderate Complexity: 1 Mod  03/04/2021  RP, OTR/L  Acute Rehabilitation Services  Office:  [phone]   Metta Clines 03/04/2021, 3:39 PM

## 2021-03-04 NOTE — Assessment & Plan Note (Addendum)
CKD stage IIIb Baseline creatinine fluctuates in the 1.5-1.8. Creatinine currently at baseline. Follow BMP as outpatient.

## 2021-03-04 NOTE — Consult Note (Signed)
Neurology Consult  Rodney [name] [facility] MR# [id number] 03/04/2021   CC: Right sided weakness and dysarthria   History is obtained from: patient and chart.  HPI: Rodney [name] is a 81 y.o. male PMHx of non-obstructive CAD, CKD stage 3, HTN, HLD, T2DM remote hx of prostate cancer presenting to [facility] due to right sided weakness, facial droop, and dysarthria. Patient reports he had taken a laxative last night and had to use the bathroom around 600 this morning. When attempting to get off the toilet, patient was unable to use RUE and RLE. Wife noted having dysarthria and R facial droop. Patient denies any prodrome prior to symptoms. Patient denies ever having experienced this before. Patient denies hx of stroke or TIA. Patient does not take anticoagulants or antiplatelets. Patient reports regular compliance with home medications.  Patient reports being back at baseline at this time.   1/23 A1c 7.0 1/23 LDL 55  LKW: 600 tNK given: No, patient back to baseline IR Thrombectomy No, patient back to baseline Modified Rankin Scale: 0-Completely asymptomatic and back to baseline post- stroke NIHSS: 0  ROS: A complete ROS was performed and is negative except as noted in the HPI.   Past Medical History:  Diagnosis Date   Bladder calculus    CAD (coronary artery disease)    non obs    CKD (chronic kidney disease), stage III (Rodney [name])    " i have 35-40% of my kidney function"; mgd by pcp    COLONIC POLYPS, HX OF 12/02/2008   DIABETES MELLITUS, TYPE II 07/19/2007   Dyspnea    reports sob over the last 2 years , underwent cath  in feb 2020 , reports cath was clean , referred to pulmonologist that stopped his Actos and encouraged weight loss  ; reports today minimum improvement in breathing , appt with pulm in June 2020   Erectile dysfunction    GERD (gastroesophageal reflux disease)    Glaucoma    GOUT 07/19/2007   History of kidney stones    HYPERLIPIDEMIA 12/02/2008   HYPERTENSION 07/19/2007    Kidney stones    Prostate cancer ([place])    Reiter syndrome    incomplete   Vitamin D deficiency      Family History  Problem Relation Age of Onset   Cancer Sister        metastatic colon ca   Colon cancer Neg Hx    Esophageal cancer Neg Hx    Rectal cancer Neg Hx    Stomach cancer Neg Hx    Lung disease Neg Hx     Social History:  reports that he quit smoking about 43 years ago. His smoking use included cigarettes. He has a 30.00 pack-year smoking history. He has never used smokeless tobacco. He reports current alcohol use. He reports that he does not use drugs.   Prior to Admission medications   Medication Sig Start Date End Date Taking? Authorizing Provider  amLODipine (NORVASC) 5 MG tablet Take 1 tablet (5 mg total) by mouth 2 (two) times daily. 03/01/21  Yes Isaac Bliss, Rayford Halsted, MD  atorvastatin (LIPITOR) 20 MG tablet TAKE 1 TABLET BY MOUTH AT BEDTIME . APPOINTMENT REQUIRED FOR FUTURE REFILLS Patient taking differently: Take 20 mg by mouth at bedtime. 03/01/21  Yes Isaac Bliss, Rayford Halsted, MD  dorzolamide-timolol (COSOPT) 22.3-6.8 MG/ML ophthalmic solution Place 1 drop into both eyes every morning. 09/20/13  Yes [provider]  glipiZIDE (GLUCOTROL) 5 MG tablet Take 1 tablet (5 mg total)  by mouth daily before breakfast. 02/24/21  Yes Isaac Bliss, Rayford Halsted, MD  losartan (COZAAR) 50 MG tablet Take 1 tablet by mouth once daily Patient taking differently: Take 50 mg by mouth at bedtime. 03/01/21  Yes Isaac Bliss, Rayford Halsted, MD  Vitamin D, Ergocalciferol, (DRISDOL) 1.25 MG (50000 UNIT) CAPS capsule Take 1 capsule (50,000 Units total) by mouth every 7 (seven) days for 12 doses. Patient taking differently: Take 50,000 Units by mouth every Friday. 02/15/21 05/04/21 Yes Erline Hau, MD  glucose blood (ACCU-CHEK AVIVA) test strip 1 each by Other route 2 (two) times daily. 06/24/11   Marletta Lor, MD    Exam: Current vital signs: BP 140/79     Pulse 94    Temp 97.7 F (36.5 C) (Oral)    Resp 12    Ht 5\' 8"  (1.727 m)    Wt 102.1 kg    SpO2 98%    BMI 34.21 kg/m   Physical Exam  Constitutional: Appears well-developed and well-nourished.  Psych: Affect appropriate to situation Eyes: No scleral injection HENT: No OP obstruction. Head: Normocephalic.  Cardiovascular: Normal rate and regular rhythm.  Respiratory: Effort normal, symmetric excursions bilaterally, no audible wheezing. GI: Soft.  No distension. There is no tenderness.  Skin: WDI  Neuro: Mental Status: Patient is awake, alert, oriented to person, place, month, year, and situation. Patient is able to give a clear and coherent history. Speech fluent, intact comprehension and repetition. No signs of aphasia or neglect. Visual Fields are full. Pupils are equal, round, and reactive to light. EOMI without ptosis or diploplia.  Facial sensation is symmetric to temperature Facial movement is symmetric.  Hearing is intact to voice. Uvula midline and palate elevates symmetrically. Shoulder shrug is symmetric. Tongue is midline without atrophy or fasciculations.  Tone is normal. Bulk is normal. 5/5 strength was present in all four extremities. Sensation is symmetric to light touch and temperature in the arms and legs. Deep Tendon Reflexes: 2+ and symmetric in the biceps and patellae. Toes are downgoing bilaterally. FNF and HKS are intact bilaterally. Gait - Deferred  I have reviewed labs in epic and the pertinent results are: CBC Latest Ref Rng & Units 03/04/2021 02/15/2021 09/11/2019  WBC 4.0 - 10.5 K/uL 7.2 7.2 6.3  Hemoglobin 13.0 - 17.0 g/dL 13.3 13.6 13.7  Hematocrit 39.0 - 52.0 % 40.1 40.9 41.6  Platelets 150 - 400 K/uL 226 260.0 265   CMP Latest Ref Rng & Units 03/04/2021 02/15/2021 09/11/2019  Glucose 70 - 99 mg/dL 171(H) 127(H) 112(H)  BUN 8 - 23 mg/dL 18 16 20   Creatinine 0.61 - 1.24 mg/dL 1.74(H) 1.53(H) 1.71(H)  Sodium 135 - 145 mmol/L 138 139 141  Potassium  3.5 - 5.1 mmol/L 4.2 4.4 5.0  Chloride 98 - 111 mmol/L 105 105 108  CO2 22 - 32 mmol/L 22 25 25   Calcium 8.9 - 10.3 mg/dL 9.2 9.7 9.3  Total Protein 6.5 - 8.1 g/dL 6.6 7.4 6.6  Total Bilirubin 0.3 - 1.2 mg/dL 1.2 0.8 0.8  Alkaline Phos 38 - 126 U/L 85 94 -  AST 15 - 41 U/L 21 19 17   ALT 0 - 44 U/L 25 24 22    Lipid Panel     Component Value Date/Time   CHOL 110 02/15/2021 0807   TRIG 141.0 02/15/2021 0807   HDL 26.60 (L) 02/15/2021 0807   CHOLHDL 4 02/15/2021 0807   VLDL 28.2 02/15/2021 0807   LDLCALC 55 02/15/2021 1610  [place] 61 09/11/2019 0932   LDLDIRECT 166.1 09/19/2013 0922    I have reviewed the images obtained: CTA head/neck showed acute intracranial hemorrhage. Mild to moderate chronic microvascular ischemic changes. Plaques at proximal bilateral ICAs <50% stenosis.    Assessment: DOMANICK CUCCIA is a 81 y.o. male PMHx non-obstructive CAD, CKD stage 3, HTN, HLD, T2DM remote hx of prostate cancer presenting to [facility] due to 30 minute episode of right sided weakness, facial droop, and dysarthria. Patient to be admitted for TIA workup.   Impression:  TIA vs Stroke  Plan: - MRI brain without contrast. - Recommend TTE. - Recommend Statin if LDL > 70 - Aspirin 81mg  daily. - Clopidogrel 75mg  daily for 3 weeks. - Permissive hypertension first 24 h < 220/110.  - Telemetry monitoring for arrhythmia. - Recommend bedside Swallow screen. - Recommend Stroke education. - Recommend PT/OT/SLP consult.  Electronically Signed, France Ravens, MD PGY1 Resident 03/04/2021, 10:51 AM  If 7pm- 7am, please page neurology on call as listed in [place].

## 2021-03-04 NOTE — Progress Notes (Signed)
Echocardiogram 2D Echocardiogram has been performed.  Rodney [name] 03/04/2021, 2:20 PM

## 2021-03-04 NOTE — Assessment & Plan Note (Addendum)
LDL 58 Continue prior home dose of statins.

## 2021-03-04 NOTE — Assessment & Plan Note (Addendum)
Held antihypertensives at this time to allow for permissive hypertension. Resume home antihypertensives at discharge.

## 2021-03-05 ENCOUNTER — Other Ambulatory Visit: Payer: Self-pay | Admitting: Student

## 2021-03-05 DIAGNOSIS — G459 Transient cerebral ischemic attack, unspecified: Secondary | ICD-10-CM | POA: Diagnosis not present

## 2021-03-05 DIAGNOSIS — E1122 Type 2 diabetes mellitus with diabetic chronic kidney disease: Secondary | ICD-10-CM | POA: Diagnosis not present

## 2021-03-05 DIAGNOSIS — I10 Essential (primary) hypertension: Secondary | ICD-10-CM | POA: Diagnosis not present

## 2021-03-05 DIAGNOSIS — N1832 Chronic kidney disease, stage 3b: Secondary | ICD-10-CM | POA: Diagnosis not present

## 2021-03-05 DIAGNOSIS — E785 Hyperlipidemia, unspecified: Secondary | ICD-10-CM | POA: Diagnosis not present

## 2021-03-05 LAB — LIPID PANEL
Cholesterol: 97 mg/dL (ref 0–200)
HDL: 22 mg/dL — ABNORMAL LOW (ref >40)
LDL Cholesterol: 58 mg/dL (ref 0–99)
Total CHOL/HDL Ratio: 4.4 RATIO
Triglycerides: 86 mg/dL (ref <150)
VLDL: 17 mg/dL (ref 0–40)

## 2021-03-05 LAB — GLUCOSE, CAPILLARY
Glucose-Capillary: 145 mg/dL — ABNORMAL HIGH (ref 70–99)
Glucose-Capillary: 150 mg/dL — ABNORMAL HIGH (ref 70–99)

## 2021-03-05 MED ORDER — CLOPIDOGREL BISULFATE 75 MG PO TABS: 75.0000 mg | ORAL_TABLET | Freq: Every day | ORAL | 0 refills | Status: AC

## 2021-03-05 MED ORDER — ASPIRIN 81 MG PO CHEW: 81.0000 mg | CHEWABLE_TABLET | Freq: Every day | ORAL | 1 refills | Status: DC

## 2021-03-05 NOTE — Discharge Summary (Signed)
Physician Discharge Summary  Rodney [name] CLE:[id number] DOB: 1940-04-03  PCP: Rodney [name], Rodney Halsted, MD  Admitted from: Home Discharged to: Home  Admit date: 03/04/2021 Discharge date: 03/05/2021  Recommendations for Outpatient Follow-up:    Follow-up Information     Rodney [name], Rodney Halsted, MD. Schedule an appointment as soon as possible for a visit in 1 week(s).   Specialty: Internal Medicine Why: To be seen with repeat labs (CBC & BMP). Contact information: Rodney [name] [phone]         Rodney [name]. Schedule an appointment as soon as possible for a visit in 1 month(s).   Specialty: Neurology Why: stroke clinic Contact information: [place] [facility] up.   Why: MDs office will call you to arrange for an outpatient 30-day heart monitor.  Kindly call them if you do not hear from them in 2-3 business days. Contact information: Rodney [name] [id number] [place]: None    Equipment/Devices: None    Discharge Condition: Improved and stable.   Code Status: Full Code Diet recommendation: Heart healthy and diabetic diet.    Discharge Diagnoses:  Principal Problem:   TIA (transient ischemic attack) Active Problems:   Type 2 diabetes mellitus with stage 3 chronic kidney disease (HCC)   Dyslipidemia   Essential hypertension   Chronic kidney disease (CKD), stage III (moderate) (HCC)   Brief Summary: 81 year old married male, independent, drives, medical history significant for type II DM, HTN, HLD, stage IIIb CKD, nonobstructive CAD, GERD and gout, presented to the [name] ED on 03/04/2021 with complaints of right-sided numbness, weakness, slurred speech, facial droop that started around  6 AM on day of admission.  Symptoms resolved in about 45 minutes to an hour.  Admitted for TIA.  Neurology consulted.    Assessment & Plan:  * TIA (transient ischemic attack)- (present on admission) Presented with transient right sided weakness, dysarthria, word finding difficulty and right facial droop. Symptoms reportedly lasted less than an hour and resolved without recurrence. Likely small vessel disease etiology.  However per neurology, cannot rule out embolic to right MCA and recommend 30-day heart monitor.  I have contacted Rodney [name] to arrange same. CTA head and neck: No acute intracranial hemorrhage.  Small vessel disease. Plaque at the proximal internal carotids causes less than 50% stenosis.Plaque along the left greater than right intracranial vertebral arteries. There is up to marked stenosis on the left. MRI brain: No acute stroke. 2D echo: Preserved LVEF. LDL 58, at goal.  A1c 6.5, at goal. Therapies evaluation: Have no recommendations for home. Not on antithrombotics prior to admission, now on aspirin 325 mg x 1 dose followed by 81 mg daily + Plavix 75 mg daily x3 weeks followed by aspirin alone. Neurology consultation appreciated.  Communicated with Dr. Erlinda Hong, has cleared patient for discharge and will arrange outpatient follow-up. Outpatient follow-up with neurology.  Type 2 diabetes mellitus with stage 3 chronic kidney disease ([place])- (present on admission) A1c 6.5 Hold oral hypoglycemic while hospitalized. SSI and adjust insulins as needed. Resume Glucotrol at discharge.  Dyslipidemia- (present on admission) LDL 58 Continue prior home dose of statins.  Essential hypertension- (present on admission) Held antihypertensives at this time to allow for  permissive hypertension. Resume home antihypertensives at discharge.  Chronic kidney disease (CKD), stage III (moderate) (HCC)- (present on admission) CKD stage IIIb Baseline creatinine fluctuates in the  1.5-1.8. Creatinine currently at baseline. Follow BMP as outpatient.    Body mass index is 34.21 kg/m./Obesity   Consultations: Neurology  Procedures: None   Discharge Instructions  Discharge Instructions     Ambulatory referral to Neurology   Complete by: As directed    An appointment is requested in approximately: 2 weeks   Ambulatory referral to Neurology   Complete by: As directed    Follow up with stroke clinic NP (Rodney [name] or Rodney [name], if both not available, consider Rodney [name], or Rodney [name]) at [facility] in about 4 weeks. Thanks.   Call MD for:   Complete by: As directed    Recurrent strokelike symptoms.   Call MD for:   Complete by: As directed    Recurrent strokelike symptoms.   Diet - low sodium heart healthy   Complete by: As directed    Diet Carb Modified   Complete by: As directed    Increase activity slowly   Complete by: As directed         Medication List     TAKE these medications    amLODipine 5 MG tablet Commonly known as: NORVASC Take 1 tablet (5 mg total) by mouth 2 (two) times daily.   aspirin 81 MG chewable tablet Chew 1 tablet (81 mg total) by mouth daily.   atorvastatin 20 MG tablet Commonly known as: LIPITOR TAKE 1 TABLET BY MOUTH AT BEDTIME . APPOINTMENT REQUIRED FOR FUTURE REFILLS What changed: See the new instructions.   clopidogrel 75 MG tablet Commonly known as: PLAVIX Take 1 tablet (75 mg total) by mouth daily for 19 days. Start taking on: March 06, 2021   dorzolamide-timolol 22.3-6.8 MG/ML ophthalmic solution Commonly known as: COSOPT Place 1 drop into both eyes every morning.   glipiZIDE 5 MG tablet Commonly known as: GLUCOTROL Take 1 tablet (5 mg total) by mouth daily before breakfast.   glucose blood test strip Commonly known as: Accu-Chek Aviva 1 each by Other route 2 (two) times daily.   losartan 50 MG tablet Commonly known as: COZAAR Take 1 tablet by mouth once daily What changed: when  to take this   Vitamin D (Ergocalciferol) 1.25 MG (50000 UNIT) Caps capsule Commonly known as: DRISDOL Take 1 capsule (50,000 Units total) by mouth every 7 (seven) days for 12 doses. What changed: when to take this       Allergies  Allergen Reactions   Ace Inhibitors Swelling    Tongue swelling. Reaction to benazepril only.       Procedures/Studies: CT ANGIO HEAD NECK W WO CM  Result Date: 03/04/2021 CLINICAL DATA:  Neuro deficit, acute, stroke suspected; resolved right sided weakness, facial droop and aphasia EXAM: CT ANGIOGRAPHY HEAD AND NECK TECHNIQUE: Multidetector CT imaging of the head and neck was performed using the standard protocol during bolus administration of intravenous contrast. Multiplanar CT image reconstructions and MIPs were obtained to evaluate the vascular anatomy. Carotid stenosis measurements (when applicable) are obtained utilizing NASCET criteria, using the distal internal carotid diameter as the denominator. RADIATION DOSE REDUCTION: This exam was performed according to the departmental dose-optimization program which includes automated exposure control, adjustment of the mA and/or kV according to patient size and/or use of iterative reconstruction technique. CONTRAST:  60 mL Isovue 370 COMPARISON:  None FINDINGS: CT HEAD Brain: There is no acute  intracranial hemorrhage, mass effect, or edema. Gray-white differentiation is preserved. There are age-indeterminate small vessel infarcts of the central white matter and basal ganglia bilaterally. Additional hypoattenuation in the supratentorial white matter is nonspecific but probably reflects mild moderate chronic microvascular ischemic changes. There is no extra-axial fluid collection. Ventricles and sulci are within normal limits in size and configuration. Vascular: No hyperdense vessel. Skull: Calvarium is unremarkable. Sinuses/Orbits: Patchy mucosal thickening. Evidence of chronic right maxillary sinus inflammation.  Orbits are unremarkable. Other: None. CTA NECK FINDINGS Aortic arch: Mild plaque along the arch and patent great vessel origins. Right carotid system: Patent. Mixed plaque along the proximal internal carotid causing less than 50% stenosis. Left carotid system: Patent. Mixed plaque along the proximal internal carotid causing less than 50% stenosis. Vertebral arteries: Patent and codominant. Skeleton: Degenerative changes of the cervical spine. Other neck: Unremarkable. Upper chest: No apical lung mass. Review of the MIP images confirms the above findings CTA HEAD FINDINGS Anterior circulation: Intracranial internal carotid arteries are patent with mild calcified plaque. Anterior and middle cerebral arteries are patent. Posterior circulation: Intracranial vertebral arteries are patent. There is multifocal mixed plaque, greater on the left. Resulting in up to marked stenosis on the distally. Basilar artery is patent. Major cerebellar artery origins are patent. Right posterior communicating artery is present. Posterior cerebral arteries are patent with atherosclerotic irregularity and mild stenosis, greater on the left. Venous sinuses: As permitted by contrast timing, patent. Review of the MIP images confirms the above findings IMPRESSION: No acute intracranial hemorrhage. Age-indeterminate small vessel infarcts of central white matter and basal bilaterally. Mild to moderate chronic microvascular ischemic changes. Plaque at the proximal internal carotids causes less than 50% stenosis. Plaque along the left greater than right intracranial vertebral arteries. There is up to marked stenosis on the left. Atherosclerotic irregularity of posterior cerebral arteries. Electronically Signed   By: Macy Mis M.D.   On: 03/04/2021 11:12   DG Chest 2 View  Result Date: 03/04/2021 CLINICAL DATA:  Possible stroke. EXAM: CHEST - 2 VIEW COMPARISON:  Chest x-ray dated February 17, 2021. FINDINGS: Unchanged borderline cardiomegaly.  Normal pulmonary vascularity. No focal consolidation, pleural effusion, or pneumothorax. No acute osseous abnormality. IMPRESSION: No active cardiopulmonary disease. Electronically Signed   By: Titus Dubin M.D.   On: 03/04/2021 13:06   MR BRAIN WO CONTRAST  Result Date: 03/04/2021 CLINICAL DATA:  Neuro deficit, acute, stroke suspected EXAM: MRI HEAD WITHOUT CONTRAST TECHNIQUE: Multiplanar, multiecho pulse sequences of the brain and surrounding structures were obtained without intravenous contrast. COMPARISON:  Same day CTA FINDINGS: Brain: No acute infarction, hemorrhage, hydrocephalus, extra-axial collection or mass lesion. Mild to moderate scattered T2/FLAIR hyperintensities in the white matter, nonspecific but compatible with chronic microvascular disease. Small remote lacunar infarcts in the right corona radiata and right basal ganglia. Vascular: Major arterial flow voids are maintained at the skull base. Skull and upper cervical spine: Normal marrow signal. Sinuses/Orbits: Mild paranasal sinus mucosal thickening. Unremarkable orbits. Other: No sizable mastoid effusions IMPRESSION: 1. No evidence of acute intracranial abnormality. 2. Small remote lacunar infarcts and mild-to-moderate chronic microvascular disease. Electronically Signed   By: Margaretha Sheffield M.D.   On: 03/04/2021 13:08   ECHOCARDIOGRAM COMPLETE  Result Date: 03/04/2021    ECHOCARDIOGRAM REPORT   Patient Name:   Rodney [name] [facility] Date of Exam: 03/04/2021 Medical Rec #:  [id number]          Height:       68.0 in Accession #:  [id number]         Weight:       225.0 lb Date of Birth:  October 13, 1940          BSA:          2.149 m Patient Age:    81 years           BP:           113/58 mmHg Patient Gender: M                  HR:           94 bpm. Exam Location:  Inpatient Procedure: 2D Echo Indications:    TIA  History:        Patient has prior history of Echocardiogram examinations, most                 recent 04/02/2018. Risk Factors:Diabetes  and Hypertension.  Sonographer:    Jefferey Pica Referring Phys: [id number] Charrie Mcconnon D Sonja Manseau IMPRESSIONS  1. Left ventricular ejection fraction, by estimation, is 60 to 65%. The left ventricle has normal function. The left ventricle has no regional wall motion abnormalities. There is moderate concentric left ventricular hypertrophy. Left ventricular diastolic function could not be evaluated.  2. Right ventricular systolic function is normal. The right ventricular size is normal. There is normal pulmonary artery systolic pressure.  3. The mitral valve is normal in structure. No evidence of mitral valve regurgitation. No evidence of mitral stenosis.  4. The aortic valve is tricuspid. There is mild calcification of the aortic valve. There is mild thickening of the aortic valve. Aortic valve regurgitation is not visualized. No aortic stenosis is present.  5. The inferior vena cava is normal in size with greater than 50% respiratory variability, suggesting right atrial pressure of 3 mmHg. Comparison(s): No significant change from prior study. Conclusion(s)/Recommendation(s): Otherwise normal echocardiogram, with minor abnormalities described in the report. FINDINGS  Left Ventricle: Left ventricular ejection fraction, by estimation, is 60 to 65%. The left ventricle has normal function. The left ventricle has no regional wall motion abnormalities. The left ventricular internal cavity size was normal in size. There is  moderate concentric left ventricular hypertrophy. Left ventricular diastolic function could not be evaluated due to nondiagnostic images. Left ventricular diastolic function could not be evaluated. Right Ventricle: The right ventricular size is normal. Right vetricular wall thickness was not well visualized. Right ventricular systolic function is normal. There is normal pulmonary artery systolic pressure. The tricuspid regurgitant velocity is 2.48 m/s, and with an assumed right atrial pressure of 3 mmHg, the  estimated right ventricular systolic pressure is 32.3 mmHg. Left Atrium: Left atrial size was normal in size. Right Atrium: Right atrial size was normal in size. Pericardium: There is no evidence of pericardial effusion. Mitral Valve: The mitral valve is normal in structure. No evidence of mitral valve regurgitation. No evidence of mitral valve stenosis. Tricuspid Valve: The tricuspid valve is normal in structure. Tricuspid valve regurgitation is mild . No evidence of tricuspid stenosis. Aortic Valve: The aortic valve is tricuspid. There is mild calcification of the aortic valve. There is mild thickening of the aortic valve. Aortic valve regurgitation is not visualized. No aortic stenosis is present. Aortic valve peak gradient measures 6.2 mmHg. Pulmonic Valve: The pulmonic valve was not well visualized. Pulmonic valve regurgitation is trivial. No evidence of pulmonic stenosis. Aorta: The aortic root, ascending aorta and aortic arch are all structurally normal, with no evidence of dilitation or  obstruction. Venous: The inferior vena cava is normal in size with greater than 50% respiratory variability, suggesting right atrial pressure of 3 mmHg. IAS/Shunts: The atrial septum is grossly normal.  LEFT VENTRICLE PLAX 2D LVIDd:         3.70 cm LVIDs:         2.40 cm LV PW:         1.60 cm LV IVS:        1.60 cm  RIGHT VENTRICLE            IVC RV Basal diam:  2.60 cm    IVC diam: 1.50 cm RV S prime:     9.36 cm/s TAPSE (M-mode): 1.4 cm LEFT ATRIUM             Index        RIGHT ATRIUM           Index LA diam:        3.50 cm 1.63 cm/m   RA Area:     12.70 cm LA Vol (A2C):   38.9 ml 18.10 ml/m  RA Volume:   25.30 ml  11.77 ml/m LA Vol (A4C):   38.1 ml 17.73 ml/m LA Biplane Vol: 38.5 ml 17.92 ml/m  AORTIC VALVE              PULMONIC VALVE AV Vmax:      124.00 cm/s PV Vmax:       0.85 m/s AV Peak Grad: 6.2 mmHg    PV Peak grad:  2.9 mmHg LVOT Vmax:    110.00 cm/s LVOT Vmean:   65.900 cm/s LVOT VTI:     0.212 m  AORTA Ao  Root diam: 3.60 cm Ao Asc diam:  3.50 cm TRICUSPID VALVE TR Peak grad:   24.6 mmHg TR Vmax:        248.00 cm/s  SHUNTS Systemic VTI: 0.21 m Buford Dresser MD Electronically signed by Buford Dresser MD Signature Date/Time: 03/04/2021/7:11:47 PM    Final       Subjective: No recurrence of symptoms.  Denies complaints.  Anxious to go home.  Discharge Exam:  Vitals:   03/04/21 2335 03/05/21 0317 03/05/21 0725 03/05/21 1131  BP: 112/65 117/73 112/73 130/77  Pulse: 64 75 83 87  Resp: 16 16 16 18   Temp: 98.1 F (36.7 C) 98.2 F (36.8 C) 97.8 F (36.6 C) 98 F (36.7 C)  TempSrc: Oral Oral Oral Oral  SpO2: 96% 100% 98% 98%  Weight:      Height:        General: Elderly male, moderately built and nourished lying comfortably propped up in bed without distress.  Oral mucosa moist. Cardiovascular: S1 & S2 heard, RRR, S1/S2 +. No murmurs, rubs, gallops or clicks. No JVD or pedal edema. Respiratory: Clear to auscultation without wheezing, rhonchi or crackles. No increased work of breathing. Abdominal:  Non distended, non tender & soft. No organomegaly or masses appreciated. Normal bowel sounds heard. CNS: Alert and oriented. No focal deficits. Extremities: no edema, no cyanosis    The results of significant diagnostics from this hospitalization (including imaging, microbiology, ancillary and laboratory) are listed below for reference.     Microbiology: Recent Results (from the past 240 hour(s))  Resp Panel by RT-PCR (Flu A&B, Covid) Nasopharyngeal Swab     Status: None   Collection Time: 03/04/21  8:31 AM   Specimen: Nasopharyngeal Swab; Nasopharyngeal(NP) swabs in vial transport medium  Result Value Ref Range Status   SARS Coronavirus  2 by RT PCR NEGATIVE NEGATIVE Final    Comment: (NOTE) SARS-CoV-2 target nucleic acids are NOT DETECTED.  The SARS-CoV-2 RNA is generally detectable in upper respiratory specimens during the acute phase of infection. The  lowest concentration of SARS-CoV-2 viral copies this assay can detect is 138 copies/mL. A negative result does not preclude SARS-Cov-2 infection and should not be used as the sole basis for treatment or other patient management decisions. A negative result may occur with  improper specimen collection/handling, submission of specimen other than nasopharyngeal swab, presence of viral mutation(s) within the areas targeted by this assay, and inadequate number of viral copies(<138 copies/mL). A negative result must be combined with clinical observations, patient history, and epidemiological information. The expected result is Negative.  Fact Sheet for Patients:  EntrepreneurPulse.com.au  Fact Sheet for Healthcare Providers:  IncredibleEmployment.be  This test is no t yet approved or cleared by the [place] FDA and  has been authorized for detection and/or diagnosis of SARS-CoV-2 by FDA under an Emergency Use Authorization (EUA). This EUA will remain  in effect (meaning this test can be used) for the duration of the COVID-19 declaration under Section 564(b)(1) of the Act, 21 U.S.C.section 360bbb-3(b)(1), unless the authorization is terminated  or revoked sooner.       Influenza A by PCR NEGATIVE NEGATIVE Final   Influenza B by PCR NEGATIVE NEGATIVE Final    Comment: (NOTE) The Xpert Xpress SARS-CoV-2/FLU/RSV plus assay is intended as an aid in the diagnosis of influenza from Nasopharyngeal swab specimens and should not be used as a sole basis for treatment. Nasal washings and aspirates are unacceptable for Xpert Xpress SARS-CoV-2/FLU/RSV testing.  Fact Sheet for Patients: EntrepreneurPulse.com.au  Fact Sheet for Healthcare Providers: IncredibleEmployment.be  This test is not yet approved or cleared by the [place] FDA and has been authorized for detection and/or diagnosis of SARS-CoV-2 by FDA under  an Emergency Use Authorization (EUA). This EUA will remain in effect (meaning this test can be used) for the duration of the COVID-19 declaration under Section 564(b)(1) of the Act, 21 U.S.C. section 360bbb-3(b)(1), unless the authorization is terminated or revoked.  Performed at [facility], [place] [address] [place], [place] [address]      Labs: CBC: Recent Labs  Lab 03/04/21 0842  WBC 7.2  NEUTROABS 5.6  HGB 13.3  HCT 40.1  MCV 99.8  PLT 086    Basic Metabolic Panel: Recent Labs  Lab 03/04/21 0842  NA 138  K 4.2  CL 105  CO2 22  GLUCOSE 171*  BUN 18  CREATININE 1.74*  CALCIUM 9.2    Liver Function Tests: Recent Labs  Lab 03/04/21 0842  AST 21  ALT 25  ALKPHOS 85  BILITOT 1.2  PROT 6.6  ALBUMIN 4.0    CBG: Recent Labs  Lab 03/04/21 1346 03/04/21 1835 03/04/21 2143 03/05/21 0608 03/05/21 1154  GLUCAP 131* 238* 194* 145* 150*    Hgb A1c Recent Labs    03/04/21 1824  HGBA1C 6.5*    Lipid Profile Recent Labs    03/05/21 0122  CHOL 97  HDL 22*  LDLCALC 58  TRIG 86  CHOLHDL 4.4    Patient declined MDs offer to speak and update family regarding his hospitalization.  He stated that he has been updating his wife.  Time coordinating discharge: 25 minutes  SIGNED:  Vernell Leep, MD,  FACP, [facility], [facility], [facility] ([name] Physician Certified). [facility]  To contact the  attending provider between 7A-7P or the covering provider during after hours 7P-7A, please log into the web site [email or web address] and access using universal [place] password for that web site. If you do not have the password, please call the hospital operator.

## 2021-03-05 NOTE — Hospital Course (Signed)
81 year old married male, independent, drives, medical history significant for type II DM, HTN, HLD, stage IIIb CKD, nonobstructive CAD, GERD and gout, presented to the [facility] ED on 03/04/2021 with complaints of right-sided numbness, weakness, slurred speech, facial droop that started around 6 AM on day of admission.  Symptoms resolved in about 45 minutes to an hour.  Admitted for TIA.  Neurology consulted.

## 2021-03-05 NOTE — Evaluation (Signed)
Physical Therapy Evaluation Patient Details Name: Rodney [name] MRN: [id number] DOB: 06/05/1940 Today's Date: 03/05/2021  History of Present Illness  81 y.o. male presenting to [facility] due to right sided weakness, facial droop, and dysarthria on 03/04/21.  MRI: No evidence of acute intracranial abnormality.  2. Small remote lacunar infarcts and mild-to-moderate chronic  microvascular disease. Thought to be TIA.  Pt with hx including  non-obstructive CAD, CKD stage 3, HTN, HLD, T2DM remote hx of prostate cancer  Clinical Impression  Pt admitted with above diagnosis.  At baseline is active and independent - enjoys going to the gym and fishing competitively.  He reports feeling back to baseline without any R sided weakness.  Pt demonstrates 5/5 strength, normal coordination, and sensation.  Scored a 23/24 on the DGI indicating low fall risk.  Pt is at independent level and no further PT needs.      Recommendations for follow up therapy are one component of a multi-disciplinary discharge planning process, led by the attending physician.  Recommendations may be updated based on patient status, additional functional criteria and insurance authorization.  Follow Up Recommendations No PT follow up    Assistance Recommended at Discharge None  Patient can return home with the following       Equipment Recommendations None recommended by PT  Recommendations for Other Services       Functional Status Assessment Patient has not had a recent decline in their functional status     Precautions / Restrictions Precautions Precautions: None      Mobility  Bed Mobility Overal bed mobility: Independent                  Transfers Overall transfer level: Independent Equipment used: None Transfers: Sit to/from Stand Sit to Stand: Independent           General transfer comment: Had supervision for eval but performing at independent level    Ambulation/Gait Ambulation/Gait assistance:  Independent Gait Distance (Feet): 400 Feet Assistive device: None Gait Pattern/deviations: WFL(Within Functional Limits) Gait velocity: normal to fast     General Gait Details: Normal gait  Stairs Stairs: Yes Stairs assistance: Independent Stair Management: No rails, Forwards, Alternating pattern Number of Stairs: 5    Wheelchair Mobility    Modified Rankin (Stroke Patients Only) Modified Rankin (Stroke Patients Only) Pre-Morbid Rankin Score: No symptoms Modified Rankin: No symptoms     Balance Overall balance assessment: Independent   Sitting balance-Leahy Scale: Normal       Standing balance-Leahy Scale: Normal                   Standardized Balance Assessment Standardized Balance Assessment : Dynamic Gait Index   Dynamic Gait Index Level Surface: Normal Change in Gait Speed: Normal Gait with Horizontal Head Turns: Normal Gait with Vertical Head Turns: Normal Gait and Pivot Turn: Normal Step Over Obstacle: Mild Impairment Step Around Obstacles: Normal Steps: Normal Total Score: 23       Pertinent Vitals/Pain Pain Assessment Pain Assessment: No/denies pain    Home Living Family/patient expects to be discharged to:: Private residence Living Arrangements: Spouse/significant other Available Help at Discharge: Family;Available 24 hours/day Type of Home: House Home Access: Stairs to enter Entrance Stairs-Rails: Right Entrance Stairs-Number of Steps: 3   Home Layout: Laundry or work area in basement;Able to live on main level with bedroom/bathroom Home Equipment: None Additional Comments: Very active, enjoys fishing.    Prior Function Prior Level of Function : Independent/Modified Independent;Driving  Hand Dominance   Dominant Hand: Right    Extremity/Trunk Assessment   Upper Extremity Assessment Upper Extremity Assessment: Overall WFL for tasks assessed    Lower Extremity Assessment Lower Extremity  Assessment: Overall WFL for tasks assessed (ROM WFL; MMT 5/5; Coordination WNL)    Cervical / Trunk Assessment Cervical / Trunk Assessment: Normal  Communication   Communication: No difficulties  Cognition Arousal/Alertness: Awake/alert Behavior During Therapy: WFL for tasks assessed/performed Overall Cognitive Status: Within Functional Limits for tasks assessed                                 General Comments: Feels back to normal        General Comments      Exercises     Assessment/Plan    PT Assessment Patient does not need any further PT services  PT Problem List         PT Treatment Interventions      PT Goals (Current goals can be found in the Care Plan section)  Acute Rehab PT Goals Patient Stated Goal: return home and to gym and fishing PT Goal Formulation: All assessment and education complete, DC therapy    Frequency       Co-evaluation               AM-PAC PT "6 Clicks" Mobility  Outcome Measure Help needed turning from your back to your side while in a flat bed without using bedrails?: None Help needed moving from lying on your back to sitting on the side of a flat bed without using bedrails?: None Help needed moving to and from a bed to a chair (including a wheelchair)?: None Help needed standing up from a chair using your arms (e.g., wheelchair or bedside chair)?: None Help needed to walk in hospital room?: None Help needed climbing 3-5 steps with a railing? : None 6 Click Score: 24    End of Session Equipment Utilized During Treatment: Gait belt Activity Tolerance: Patient tolerated treatment well Patient left: in bed Nurse Communication: Mobility status PT Visit Diagnosis: Other abnormalities of gait and mobility (R26.89)    Time: 7654-6503 PT Time Calculation (min) (ACUTE ONLY): 9 min   Charges:   PT Evaluation $PT Eval Low Complexity: 1 Low          Desera Graffeo, PT Acute Rehab Services Pager [phone] [facility] [phone]   Karlton Lemon 03/05/2021, 10:25 AM

## 2021-03-05 NOTE — TOC Transition Note (Signed)
Transition of Care [employer] [facility]) - CM/SW Discharge Note   Patient Details  Name: Rodney [name] MRN: [id number] Date of Birth: 12-20-1940  [facility]) CM/SW Contact:  Pollie Friar, RN Phone Number: 03/05/2021, 2:30 PM   Clinical Narrative:    Patient is discharging home with self care. No needs per PT/OT and no DME needs.  Pt has supervision at home. He denies any issues with home medications or transportation.  Pt has transport home.    Final next level of care: Home/Self Care Barriers to Discharge: No Barriers Identified   Patient Goals and CMS Choice        Discharge Placement                       Discharge Plan and Services                                     Social Determinants of Health (SDOH) Interventions     Readmission Risk Interventions No flowsheet data found.

## 2021-03-05 NOTE — Progress Notes (Addendum)
STROKE TEAM PROGRESS NOTE   SUBJECTIVE (INTERVAL HISTORY) He is seen alone at the bedside.  Overall his condition is rapidly improving. Patient reports that while using the restroom last night, he had acute onset L sided weakness in his arm and leg, as well as dysarthria, resolving after 45-60 mins. First time of such an occurrence. This morning, patient endorses return to baseline. He is without speech deficits and he no longer feels weak.  CTA with age-indeterminate small vessel infarcts of white matter and basal bilaterally and mild-mod chronic microvascular ischemic changes. Plaque of ICA < 50% stenosis. MRI with no acute ischemic or hemorrhagic changes and small remote lacunar infarcts and mild-mod chronic microvascular disease.    OBJECTIVE CBC:  Recent Labs  Lab 03/04/21 0842  WBC 7.2  NEUTROABS 5.6  HGB 13.3  HCT 40.1  MCV 99.8  PLT 355   Basic Metabolic Panel:  Recent Labs  Lab 03/04/21 0842  NA 138  K 4.2  CL 105  CO2 22  GLUCOSE 171*  BUN 18  CREATININE 1.74*  CALCIUM 9.2   Lipid Panel:  Recent Labs  Lab 03/05/21 0122  CHOL 97  TRIG 86  HDL 22*  CHOLHDL 4.4  VLDL 17  LDLCALC 58   HgbA1c:  Recent Labs  Lab 03/04/21 1824  HGBA1C 6.5*   Urine Drug Screen: No results for input(s): LABOPIA, COCAINSCRNUR, LABBENZ, AMPHETMU, THCU, LABBARB in the last 168 hours.  Alcohol Level No results for input(s): ETH in the last 168 hours.  IMAGING past 24 hours ECHOCARDIOGRAM COMPLETE  Result Date: 03/04/2021    ECHOCARDIOGRAM REPORT   Patient Name:   Rodney [name] [facility] Date of Exam: 03/04/2021 Medical Rec #:  [id number]          Height:       68.0 in Accession #:    [id number]         Weight:       225.0 lb Date of Birth:  January 20, 1941          BSA:          2.149 m Patient Age:    81 years           BP:           113/58 mmHg Patient Gender: M                  HR:           94 bpm. Exam Location:  Inpatient Procedure: 2D Echo Indications:    TIA  History:        Patient has  prior history of Echocardiogram examinations, most                 recent 04/02/2018. Risk Factors:Diabetes and Hypertension.  Sonographer:    Jefferey Pica Referring Phys: [id number] ANAND D HONGALGI IMPRESSIONS  1. Left ventricular ejection fraction, by estimation, is 60 to 65%. The left ventricle has normal function. The left ventricle has no regional wall motion abnormalities. There is moderate concentric left ventricular hypertrophy. Left ventricular diastolic function could not be evaluated.  2. Right ventricular systolic function is normal. The right ventricular size is normal. There is normal pulmonary artery systolic pressure.  3. The mitral valve is normal in structure. No evidence of mitral valve regurgitation. No evidence of mitral stenosis.  4. The aortic valve is tricuspid. There is mild calcification of the aortic valve. There is mild thickening of the aortic valve. Aortic valve regurgitation is  not visualized. No aortic stenosis is present.  5. The inferior vena cava is normal in size with greater than 50% respiratory variability, suggesting right atrial pressure of 3 mmHg. Comparison(s): No significant change from prior study. Conclusion(s)/Recommendation(s): Otherwise normal echocardiogram, with minor abnormalities described in the report. FINDINGS  Left Ventricle: Left ventricular ejection fraction, by estimation, is 60 to 65%. The left ventricle has normal function. The left ventricle has no regional wall motion abnormalities. The left ventricular internal cavity size was normal in size. There is  moderate concentric left ventricular hypertrophy. Left ventricular diastolic function could not be evaluated due to nondiagnostic images. Left ventricular diastolic function could not be evaluated. Right Ventricle: The right ventricular size is normal. Right vetricular wall thickness was not well visualized. Right ventricular systolic function is normal. There is normal pulmonary artery systolic pressure.  The tricuspid regurgitant velocity is 2.48 m/s, and with an assumed right atrial pressure of 3 mmHg, the estimated right ventricular systolic pressure is 57.8 mmHg. Left Atrium: Left atrial size was normal in size. Right Atrium: Right atrial size was normal in size. Pericardium: There is no evidence of pericardial effusion. Mitral Valve: The mitral valve is normal in structure. No evidence of mitral valve regurgitation. No evidence of mitral valve stenosis. Tricuspid Valve: The tricuspid valve is normal in structure. Tricuspid valve regurgitation is mild . No evidence of tricuspid stenosis. Aortic Valve: The aortic valve is tricuspid. There is mild calcification of the aortic valve. There is mild thickening of the aortic valve. Aortic valve regurgitation is not visualized. No aortic stenosis is present. Aortic valve peak gradient measures 6.2 mmHg. Pulmonic Valve: The pulmonic valve was not well visualized. Pulmonic valve regurgitation is trivial. No evidence of pulmonic stenosis. Aorta: The aortic root, ascending aorta and aortic arch are all structurally normal, with no evidence of dilitation or obstruction. Venous: The inferior vena cava is normal in size with greater than 50% respiratory variability, suggesting right atrial pressure of 3 mmHg. IAS/Shunts: The atrial septum is grossly normal.  LEFT VENTRICLE PLAX 2D LVIDd:         3.70 cm LVIDs:         2.40 cm LV PW:         1.60 cm LV IVS:        1.60 cm  RIGHT VENTRICLE            IVC RV Basal diam:  2.60 cm    IVC diam: 1.50 cm RV S prime:     9.36 cm/s TAPSE (M-mode): 1.4 cm LEFT ATRIUM             Index        RIGHT ATRIUM           Index LA diam:        3.50 cm 1.63 cm/m   RA Area:     12.70 cm LA Vol (A2C):   38.9 ml 18.10 ml/m  RA Volume:   25.30 ml  11.77 ml/m LA Vol (A4C):   38.1 ml 17.73 ml/m LA Biplane Vol: 38.5 ml 17.92 ml/m  AORTIC VALVE              PULMONIC VALVE AV Vmax:      124.00 cm/s PV Vmax:       0.85 m/s AV Peak Grad: 6.2 mmHg    PV  Peak grad:  2.9 mmHg LVOT Vmax:    110.00 cm/s LVOT Vmean:   65.900 cm/s LVOT VTI:  0.212 m  AORTA Ao Root diam: 3.60 cm Ao Asc diam:  3.50 cm TRICUSPID VALVE TR Peak grad:   24.6 mmHg TR Vmax:        248.00 cm/s  SHUNTS Systemic VTI: 0.21 m Buford Dresser MD Electronically signed by Buford Dresser MD Signature Date/Time: 03/04/2021/7:11:47 PM    Final      PHYSICAL EXAM Temp:  [97.8 F (36.6 C)-98.2 F (36.8 C)] 98 F (36.7 C) (02/10 1131) Pulse Rate:  [64-98] 87 (02/10 1131) Resp:  [14-18] 18 (02/10 1131) BP: (95-130)/(58-84) 130/77 (02/10 1131) SpO2:  [95 %-100 %] 98 % (02/10 1131)  General - Well nourished, well developed, elderly male in no apparent distress.  Cardiovascular - Regular rhythm and rate on telemetry.  Mental Status -  Level of arousal and orientation to time, place, and person were intact. Language including expression, naming, repetition, comprehension was assessed and found intact. Attention span and concentration were normal. Recent and remote memory was with deficits in recent memory. 3 word recall 0/3 Fund of Knowledge was assessed and was intact.  Cranial Nerves II - XII - II - Visual field intact OU. III, IV, VI - Extraocular movements intact. V - Facial sensation intact bilaterally. VII - Facial movement intact bilaterally. VIII - Hearing & vestibular intact bilaterally. X - Palate elevates symmetrically. XI - Chin turning & shoulder shrug intact bilaterally. XII - Tongue protrusion intact.  Motor Strength - The patients strength was normal in all L sided extremities and RUE. Strength mildly decreased in distal muscles of RLE; proximal muscles with normal strength. Pronator drift was absent.  Bulk was normal and fasciculations were absent.   Motor Tone - Muscle tone was assessed at the neck and appendages and was normal.  Sensory - Light touch was assessed and symmetrical.    Coordination - The patient had normal movements in the hands  and feet with no ataxia or dysmetria.  Tremor was absent.  Gait and Station - deferred.    ASSESSMENT/PLAN Mr. Rodney [name] is a 81 y.o. male with history of CKD Stage III, HTN, HLD, non-obstructive CAD, T2DM, and remote prostate cancer presenting with transient R sided weakness, facial droop, and dysarthria which resolved upon reaching .   TIA - concerning for embolic TIA given expressive aphasia. CTA head & neck  age-indeterminate small vessel infarcts of white matter and basal bilaterally and mild-mod chronic microvascular ischemic changes. Plaque of ICA < 50% stenosis MRI  no acute ischemic or hemorrhagic changes and small remote lacunar infarcts and mild-mod chronic microvascular disease 2D Echo LVEF 60-65%, moderate concentric LVH, tricuspid aortic valve with mild calcification and thickening. Recommend 30-day CardioNet monitor as outpatient to rule out A-fib. LDL 58 HgbA1c 6.5 VTE prophylaxis - SCDs No antithrombotic prior to admission, now on aspirin 81 mg daily and clopidogrel 75 mg daily x 3 weeks then ASA monotherapy Therapy recommendations:  No PT/OT/SLP follow-up Disposition:  Home  Hypertension Home meds:  Norvasc 5 mg, Losartan 50 mg Stable Goal SBP <160 Long-term BP goal normotensive  Hyperlipidemia Home meds:  Atorvastatin 20 mg, resumed in hospital LDL 58, goal < 70 Continue statin at discharge  Diabetes type II Controlled Home meds:  Glipizide HgbA1c 6.5, goal < 7.0 CBGs SSI  Other Stroke Risk Factors Advanced Age >/= 52  Obesity, Body mass index is 34.21 kg/m., BMI >/= 30 associated with increased stroke risk, recommend weight loss, diet and exercise as appropriate  Coronary artery disease  Other Active Problems  Hospital day # 0    Rosezetta Schlatter, MD Stroke Neurology- Neuro Psych Resident 03/05/2021 1:53 PM  ATTENDING NOTE: I reviewed above note and agree with the assessment and plan. Pt was seen and examined.   81 year old male  with history of hypertension, hyperlipidemia, diabetes, CAD, CKD 3, prostate cancer admitted for episode of right-sided weakness, expressive aphasia, right-sided numbness and right facial droop.  Symptom lasted 1 hour and resolved.  CT negative, CT head and neck bilateral ICA atherosclerosis, bilateral VA stenosis, left more than right.  MRI showed no acute infarct but old infarcts.  EF 60 to 65%.  LDL 58, A1c 6.5, creatinine 1.74.  On exam, patient neurologically intact, no focal deficit.  Etiology for patient symptoms consistent with TIA, however concerning for embolic TIA given expressive aphasia.  Recommend 30-day CardioNet monitor as outpatient to rule out A-fib.  Recommend aspirin 81 and Plavix 75 DAPT for 3 weeks and then aspirin alone.  Continue Lipitor 20.  PT/OT no recommendation.  For detailed assessment and plan, please refer to above as I have made changes wherever appropriate.   Neurology will sign off. Please call with questions. Pt will follow up with stroke clinic NP at [facility] in about 4 weeks. Thanks for the consult.   Rosalin Hawking, MD PhD Stroke Neurology 03/05/2021 7:37 PM    To contact Stroke Continuity provider, please refer to [email or web address] After hours, contact General Neurology

## 2021-03-05 NOTE — Evaluation (Signed)
Speech Language Pathology Evaluation Patient Details Name: Rodney [name] MRN: [id number] DOB: 1940/12/11 Today's Date: 03/05/2021 Time: 8756-4332 SLP Time Calculation (min) (ACUTE ONLY): 13 min  Problem List:  Patient Active Problem List   Diagnosis Date Noted   TIA (transient ischemic attack) 03/04/2021   Vitamin D deficiency 02/15/2021   Incontinence when straining, male 05/21/2019   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/01/2018   Morbid (severe) obesity due to excess calories ([place]) complicated by hbp/dm 95/18/8416   Dyspnea on exertion    Ankle fracture 05/16/2015   Malignant neoplasm of prostate ([place]) 11/08/2013   Prostate cancer ([place]) 03/29/2012   Nephrolithiasis 03/29/2012   Chronic kidney disease (CKD), stage III (moderate) ([place]) 12/03/2010   Dyslipidemia 12/02/2008   History of colonic polyps 12/02/2008   Type 2 diabetes mellitus with stage 3 chronic kidney disease ([place]) 07/19/2007   Gout 07/19/2007   Essential hypertension 07/19/2007   Past Medical History:  Past Medical History:  Diagnosis Date   Bladder calculus    CAD (coronary artery disease)    non obs    CKD (chronic kidney disease), stage III ([place])    " i have 35-40% of my kidney function"; mgd by pcp    COLONIC POLYPS, HX OF 12/02/2008   DIABETES MELLITUS, TYPE II 07/19/2007   Dyspnea    reports sob over the last 2 years , underwent cath  in feb 2020 , reports cath was clean , referred to pulmonologist that stopped his Actos and encouraged weight loss  ; reports today minimum improvement in breathing , appt with pulm in June 2020   Erectile dysfunction    GERD (gastroesophageal reflux disease)    Glaucoma    GOUT 07/19/2007   History of kidney stones    HYPERLIPIDEMIA 12/02/2008   HYPERTENSION 07/19/2007   Kidney stones    Prostate cancer ([place])    Reiter syndrome    incomplete   Vitamin D deficiency    Past Surgical History:  Past Surgical History:  Procedure Laterality Date   bladder neck dilation      CATARACT EXTRACTION     both eyes- 2015   COLONOSCOPY     CYSTOSCOPY N/A 06/11/2018   Procedure: CYSTOSCOPY WITH REMOVAL OF BLADDER CALCULUS;  Surgeon: [name], MD;  Location: [facility];  Service: Urology;  Laterality: N/A;   ELBOW SURGERY     bone spur - right   LEFT HEART CATH AND CORONARY ANGIOGRAPHY N/A 03/19/2018   Procedure: LEFT HEART CATH AND CORONARY ANGIOGRAPHY;  Surgeon: Belva Crome, MD;  Location: [facility];  Service: Cardiovascular;  Laterality: N/A;   ORIF ANKLE FRACTURE Left 05/17/2015   Procedure: OPEN REDUCTION INTERNAL FIXATION (ORIF) ANKLE FRACTURE;  Surgeon: Meredith Pel, MD;  Location: [facility];  Service: Orthopedics;  Laterality: Left;   PROSTATECTOMY     URINARY SPHINCTER IMPLANT N/A 05/21/2019   Procedure: IMPLANTATION OF ARTIFICIAL URINARY SPHINCTER CYSTOSCOPY;  Surgeon: Bjorn Loser, MD;  Location: [facility];  Service: Urology;  Laterality: N/A;   VASECTOMY     HPI:  Rodney [name] is a 81 year old married male, independent, drives, who [facility]21 with complaints of right-sided numbness, weakness, slurred speech, facial droop. Pt reports symptoms resolved within 45 minutes to an hour.  MRI and CXR 2/9 negative for acute findings. Pt with medical history significant for type II DM, HTN, HLD, stage IIIb CKD, nonobstructive CAD, GERD and gout.   Assessment / Plan / Recommendation  Clinical Impression  Pt presents with cognitive linguistic function within the normal range as assessed using the COGNISTAT (see below for additiona information).  All subtests were within the average range.  Pt is gregarious and engaging. He feels that he has returned to baseline and reports no differences on any tasks.  Pt does not have any further ST needs at this time.  SLP will sign off.  COGNISTAT: All subtests are within the average range, except where otherwise specified.  Orientation:  12/12 Attention: 8/8 Registration:  3 Comprehension: 6/6 Repetition: 12/12 Naming: 8/8 Construction: pt copied screen figures without difficutly Memory: 12/12 Calculations: 4/4 Similarities: 7/8 Judgment: 5/6      SLP Assessment  SLP Recommendation/Assessment: Patient does not need any further Speech Lanaguage Pathology Services SLP Visit Diagnosis: Cognitive communication deficit (R41.841)    Recommendations for follow up therapy are one component of a multi-disciplinary discharge planning process, led by the attending physician.  Recommendations may be updated based on patient status, additional functional criteria and insurance authorization.    Follow Up Recommendations  No SLP follow up    Assistance Recommended at Discharge  None  Functional Status Assessment Patient has not had a recent decline in their functional status  Frequency and Duration   N/A        SLP Evaluation Cognition  Overall Cognitive Status: Within Functional Limits for tasks assessed Orientation Level: Oriented X4 Attention: Focused;Sustained Focused Attention: Appears intact Sustained Attention: Appears intact Memory: Appears intact Executive Function: Reasoning;Organizing Reasoning: Appears intact Organizing: Appears intact       Comprehension  Auditory Comprehension Overall Auditory Comprehension: Appears within functional limits for tasks assessed Commands: Within Functional Limits Conversation: Complex Visual Recognition/Discrimination Discrimination: Not tested Reading Comprehension Reading Status: Not tested    Expression Expression Primary Mode of Expression: Verbal Verbal Expression Overall Verbal Expression: Appears within functional limits for tasks assessed Initiation: No impairment Repetition: No impairment Naming: No impairment Pragmatics: No impairment Written Expression Dominant Hand: Right Written Expression: Not tested   Oral / Motor  Motor Speech Overall Motor Speech: Appears within functional  limits for tasks assessed Respiration: Within functional limits Resonance: Within functional limits Articulation: Within functional limitis Intelligibility: Intelligible Motor Planning: Witnin functional limits Motor Speech Errors: Not applicable            Celedonio Savage, MA, [place] Office: [phone] 03/05/2021, 10:59 AM

## 2021-03-05 NOTE — Progress Notes (Signed)
Pt discharged at this time.  Has all belongings with him including clothing and cell phone.  He verbalizes understanding of all discharge instructions and where to pick up prescriptions.

## 2021-03-05 NOTE — Care Management Obs Status (Signed)
[facility] NOTIFICATION   Patient Details  Name: SAMARTH OGLE MRN: [id number] Date of Birth: [birth year]   Medicare Observation Status Notification Given:  Yes    Pollie Friar, RN 03/05/2021, 2:25 PM

## 2021-03-05 NOTE — Progress Notes (Signed)
Ordered outpatient Event Monitor for further evaluation of TIA at the request of Neurology.  Darreld Mclean, PA-C 03/05/2021 2:37 PM

## 2021-03-05 NOTE — Discharge Instructions (Signed)

## 2021-03-08 ENCOUNTER — Telehealth: Payer: Self-pay

## 2021-03-08 NOTE — Telephone Encounter (Signed)
Transition Care Management Follow-up Telephone Call Date of discharge and from where: [facility] 03-05-21 Dx: Stroke like symptoms How have you been since you were released from the hospital? Feeling better  Any questions or concerns? No  Items Reviewed: Did the pt receive and understand the discharge instructions provided? Yes  Medications obtained and verified? Yes  Other? No  Any new allergies since your discharge? No  Dietary orders reviewed? Yes Do you have support at home? Yes   Home Care and Equipment/Supplies: Were home health services ordered? no If so, what is the name of the agency? na  Has the agency set up a time to come to the patient's home? not applicable Were any new equipment or medical supplies ordered?  No What is the name of the medical supply agency? na Were you able to get the supplies/equipment? not applicable Do you have any questions related to the use of the equipment or supplies? No  Functional Questionnaire: (I = Independent and D = Dependent) ADLs: I  Bathing/Dressing- I  Meal Prep- I  Eating- I  Maintaining continence- I  Transferring/Ambulation- I  Managing Meds- I  Follow up appointments reviewed:  PCP Hospital f/u appt confirmed? Yes  Scheduled to see Dr Deniece Ree on 03-11-21 @ [facility] f/u appt confirmed? No  Are transportation arrangements needed? no If their condition worsens, is the pt aware to call PCP or go to the Emergency Dept.? Yes Was the patient provided with contact information for the PCP's office or ED? Yes Was to pt encouraged to call back with questions or concerns? Yes

## 2021-03-11 ENCOUNTER — Encounter: Payer: Self-pay | Admitting: Internal Medicine

## 2021-03-11 ENCOUNTER — Ambulatory Visit (INDEPENDENT_AMBULATORY_CARE_PROVIDER_SITE_OTHER): Payer: Medicare Other | Admitting: Internal Medicine

## 2021-03-11 ENCOUNTER — Other Ambulatory Visit: Payer: Self-pay | Admitting: Student

## 2021-03-11 ENCOUNTER — Telehealth: Payer: Self-pay | Admitting: *Deleted

## 2021-03-11 VITALS — BP 120/70 | HR 90 | Temp 97.9°F | Wt 228.0 lb

## 2021-03-11 DIAGNOSIS — I10 Essential (primary) hypertension: Secondary | ICD-10-CM | POA: Diagnosis not present

## 2021-03-11 DIAGNOSIS — E785 Hyperlipidemia, unspecified: Secondary | ICD-10-CM

## 2021-03-11 DIAGNOSIS — Z1211 Encounter for screening for malignant neoplasm of colon: Secondary | ICD-10-CM

## 2021-03-11 DIAGNOSIS — N1832 Chronic kidney disease, stage 3b: Secondary | ICD-10-CM | POA: Diagnosis not present

## 2021-03-11 DIAGNOSIS — E1122 Type 2 diabetes mellitus with diabetic chronic kidney disease: Secondary | ICD-10-CM | POA: Diagnosis not present

## 2021-03-11 DIAGNOSIS — Z09 Encounter for follow-up examination after completed treatment for conditions other than malignant neoplasm: Secondary | ICD-10-CM

## 2021-03-11 DIAGNOSIS — G459 Transient cerebral ischemic attack, unspecified: Secondary | ICD-10-CM

## 2021-03-11 DIAGNOSIS — I4891 Unspecified atrial fibrillation: Secondary | ICD-10-CM

## 2021-03-11 DIAGNOSIS — Z8673 Personal history of transient ischemic attack (TIA), and cerebral infarction without residual deficits: Secondary | ICD-10-CM | POA: Diagnosis not present

## 2021-03-11 LAB — PSA: PSA: 19.6

## 2021-03-11 NOTE — Telephone Encounter (Signed)
Patient was enrolled for [employer] to ship a 30 day cardiac event monitor to his home. Per the [employer] website, as of 03/11/20, a monitor serial # has been assign to the patient and a UPS shipping label and tracking number has been created.  No estimated delivery date has been assigned, but packages are usually sent out 3 Day Delivery. Contacted patient to see if he would like to be scheduled to bring monitor into our [facility] office to have it applied and be given a tutorial. Patient scheduled to have monitor applied on Wednesday, 03/17/21, at 10:00 AM.

## 2021-03-11 NOTE — Progress Notes (Signed)
Hospital follow-up visit     CC/Reason for Visit: Hospitalization follow-up  HPI: Rodney [name] is a 81 y.o. male who is coming in today for the above mentioned reasons, specifically transitional care services face-to-face visit.    Dates hospitalized: 04/01/2021-03/05/2021 Days since discharge from hospital: 6 Patient was discharged from the hospital to: Home Reason for admission to hospital: TIA Date of interactive phone contact with patient and/or caregiver: 03/08/2021  I have reviewed in detail patient's discharge summary plus pertinent specific notes, labs, and images from the hospitalization.  Yes  He presented to the hospital on February 9 with complaints of right-sided numbness, weakness slurred speech and facial droop.  Symptoms resolved within 45 minutes.  He was admitted for TIA work-up.  I will summarize work-up below: CTA head and neck: No acute intracranial hemorrhage.  Small vessel disease. Plaque at the proximal internal carotids causes less than 50% stenosis.Plaque along the left greater than right intracranial vertebral arteries. There is up to marked stenosis on the left. MRI brain: No acute stroke. 2D echo: Preserved LVEF. LDL 58, at goal.  A1c 6.5, at goal. Therapies evaluation: Have no recommendations for home.  He has been placed on dual antiplatelet therapy for 3 weeks and afterwards will remain on 81 mg of aspirin alone. He is supposed to have a 30-day cardiac monitor which she has not received yet.  He has an appointment to follow-up with cardiology end of March. He is currently feeling well and has no acute concerns or complaints, no residual deficits.  Medication reconciliation was done today and patient is taking meds as recommended by discharging hospitalist/specialist.  Yes   Past Medical/Surgical History: Past Medical History:  Diagnosis Date   Bladder calculus    CAD (coronary artery disease)    non obs    CKD (chronic kidney disease),  stage III ([place])    " i have 35-40% of my kidney function"; mgd by pcp    COLONIC POLYPS, HX OF 12/02/2008   DIABETES MELLITUS, TYPE II 07/19/2007   Dyspnea    reports sob over the last 2 years , underwent cath  in feb 2020 , reports cath was clean , referred to pulmonologist that stopped his Actos and encouraged weight loss  ; reports today minimum improvement in breathing , appt with pulm in June 2020   Erectile dysfunction    GERD (gastroesophageal reflux disease)    Glaucoma    GOUT 07/19/2007   History of kidney stones    HYPERLIPIDEMIA 12/02/2008   HYPERTENSION 07/19/2007   Kidney stones    Prostate cancer ([place])    Reiter syndrome    incomplete   Vitamin D deficiency     Past Surgical History:  Procedure Laterality Date   bladder neck dilation     CATARACT EXTRACTION     both eyes- 2015   COLONOSCOPY     CYSTOSCOPY N/A 06/11/2018   Procedure: CYSTOSCOPY WITH REMOVAL OF BLADDER CALCULUS;  Surgeon: [name], MD;  Location: [facility];  Service: Urology;  Laterality: N/A;   ELBOW SURGERY     bone spur - right   LEFT HEART CATH AND CORONARY ANGIOGRAPHY N/A 03/19/2018   Procedure: LEFT HEART CATH AND CORONARY ANGIOGRAPHY;  Surgeon: Belva Crome, MD;  Location: [facility];  Service: Cardiovascular;  Laterality: N/A;   ORIF ANKLE FRACTURE Left 05/17/2015   Procedure: OPEN REDUCTION INTERNAL FIXATION (ORIF) ANKLE FRACTURE;  Surgeon: Meredith Pel, MD;  Location:  [facility];  Service: Orthopedics;  Laterality: Left;   PROSTATECTOMY     URINARY SPHINCTER IMPLANT N/A 05/21/2019   Procedure: IMPLANTATION OF ARTIFICIAL URINARY SPHINCTER CYSTOSCOPY;  Surgeon: Bjorn Loser, MD;  Location: [facility];  Service: Urology;  Laterality: N/A;   VASECTOMY      Social History:  reports that he quit smoking about 43 years ago. His smoking use included cigarettes. He has a 30.00 pack-year smoking history. He has never used smokeless tobacco. He reports that he does not currently use  alcohol. He reports that he does not use drugs.  Allergies: Allergies  Allergen Reactions   Ace Inhibitors Swelling    Tongue swelling. Reaction to benazepril only.     Family History:  Family History  Problem Relation Age of Onset   Cancer Sister        metastatic colon ca   Colon cancer Neg Hx    Esophageal cancer Neg Hx    Rectal cancer Neg Hx    Stomach cancer Neg Hx    Lung disease Neg Hx      Current Outpatient Medications:    amLODipine (NORVASC) 5 MG tablet, Take 1 tablet (5 mg total) by mouth 2 (two) times daily., Disp: 180 tablet, Rfl: 1   aspirin 81 MG chewable tablet, Chew 1 tablet (81 mg total) by mouth daily., Disp: 30 tablet, Rfl: 1   atorvastatin (LIPITOR) 20 MG tablet, TAKE 1 TABLET BY MOUTH AT BEDTIME . APPOINTMENT REQUIRED FOR FUTURE REFILLS (Patient taking differently: Take 20 mg by mouth at bedtime.), Disp: 90 tablet, Rfl: 0   clopidogrel (PLAVIX) 75 MG tablet, Take 1 tablet (75 mg total) by mouth daily for 19 days., Disp: 19 tablet, Rfl: 0   dorzolamide-timolol (COSOPT) 22.3-6.8 MG/ML ophthalmic solution, Place 1 drop into both eyes every morning., Disp: , Rfl:    glipiZIDE (GLUCOTROL) 5 MG tablet, Take 1 tablet (5 mg total) by mouth daily before breakfast., Disp: 90 tablet, Rfl: 1   glucose blood (ACCU-CHEK AVIVA) test strip, 1 each by Other route 2 (two) times daily., Disp: 100 each, Rfl: 3   losartan (COZAAR) 50 MG tablet, Take 1 tablet by mouth once daily (Patient taking differently: Take 50 mg by mouth at bedtime.), Disp: 90 tablet, Rfl: 0   Vitamin D, Ergocalciferol, (DRISDOL) 1.25 MG (50000 UNIT) CAPS capsule, Take 1 capsule (50,000 Units total) by mouth every 7 (seven) days for 12 doses. (Patient taking differently: Take 50,000 Units by mouth every Friday.), Disp: 12 capsule, Rfl: 0  Review of Systems:  Constitutional: Denies fever, chills, diaphoresis, appetite change and fatigue.  HEENT: Denies photophobia, eye pain, redness, hearing loss, ear pain,  congestion, sore throat, rhinorrhea, sneezing, mouth sores, trouble swallowing, neck pain, neck stiffness and tinnitus.   Respiratory: Denies SOB, DOE, cough, chest tightness,  and wheezing.   Cardiovascular: Denies chest pain, palpitations and leg swelling.  Gastrointestinal: Denies nausea, vomiting, abdominal pain, diarrhea, constipation, blood in stool and abdominal distention.  Genitourinary: Denies dysuria, urgency, frequency, hematuria, flank pain and difficulty urinating.  Endocrine: Denies: hot or cold intolerance, sweats, changes in hair or nails, polyuria, polydipsia. Musculoskeletal: Denies myalgias, back pain, joint swelling, arthralgias and gait problem.  Skin: Denies pallor, rash and wound.  Neurological: Denies dizziness, seizures, syncope, weakness, light-headedness, numbness and headaches.  Hematological: Denies adenopathy. Easy bruising, personal or family bleeding history  Psychiatric/Behavioral: Denies suicidal ideation, mood changes, confusion, nervousness, sleep disturbance and agitation    Physical Exam: Vitals:   03/11/21 1324  BP: 120/70  Pulse: 90  Temp: 97.9 F (36.6 C)  TempSrc: Oral  SpO2: 99%  Weight: 228 lb (103.4 kg)    Body mass index is 34.67 kg/m.   Constitutional: NAD, calm, comfortable Eyes: PERRL, lids and conjunctivae normal, wears corrective lenses ENMT: Mucous membranes are moist.  Respiratory: clear to auscultation bilaterally, no wheezing, no crackles. Normal respiratory effort. No accessory muscle use.  Cardiovascular: Regular rate and rhythm, no murmurs / rubs / gallops. No extremity edema.  Psychiatric: Normal judgment and insight. Alert and oriented x 3. Normal mood.    Impression and Plan:  Hospital discharge follow-up  Screening for malignant neoplasm of colon - Plan: Ambulatory referral to Gastroenterology  TIA (transient ischemic attack)  Dyslipidemia  Type 2 diabetes mellitus with stage 3b chronic kidney disease,  without long-term current use of insulin (HCC)  Essential hypertension  Stage 3b chronic kidney disease [facility])  -Hospital charts have been reviewed in great detail. -He knows to remain on dual antiplatelet therapy until around March 3 and then discontinue Plavix and remain on aspirin alone. -I will contact cardiology to see about getting his 30-day event monitor mailed to him. -He is overdue for colon cancer screening so GI referral has been placed.    Medical decision making of high complexity was utilized today.     Lelon Frohlich, MD [place] [facility]

## 2021-03-17 ENCOUNTER — Ambulatory Visit (INDEPENDENT_AMBULATORY_CARE_PROVIDER_SITE_OTHER): Payer: Medicare Other

## 2021-03-17 ENCOUNTER — Other Ambulatory Visit: Payer: Self-pay

## 2021-03-17 DIAGNOSIS — I4891 Unspecified atrial fibrillation: Secondary | ICD-10-CM

## 2021-03-17 DIAGNOSIS — G459 Transient cerebral ischemic attack, unspecified: Secondary | ICD-10-CM | POA: Diagnosis not present

## 2021-03-17 NOTE — Progress Notes (Unsigned)
[employer] 30 day cardiac event monitor serial # [id number] mailed to patient 03/05/20 applied in office.

## 2021-03-22 ENCOUNTER — Other Ambulatory Visit: Payer: Self-pay

## 2021-03-22 ENCOUNTER — Ambulatory Visit (INDEPENDENT_AMBULATORY_CARE_PROVIDER_SITE_OTHER): Payer: Medicare Other | Admitting: Internal Medicine

## 2021-03-22 DIAGNOSIS — R0602 Shortness of breath: Secondary | ICD-10-CM

## 2021-03-22 LAB — PULMONARY FUNCTION TEST
DL/VA % pred: 92 %
DL/VA: 3.61 ml/min/mmHg/L
DLCO cor % pred: 74 %
DLCO cor: 17.13 ml/min/mmHg
DLCO unc % pred: 71 %
DLCO unc: 16.47 ml/min/mmHg
FEF 25-75 Post: 2.45 L/sec
FEF 25-75 Pre: 2.13 L/sec
FEF2575-%Change-Post: 14 %
FEF2575-%Pred-Post: 136 %
FEF2575-%Pred-Pre: 118 %
FEV1-%Change-Post: 4 %
FEV1-%Pred-Post: 87 %
FEV1-%Pred-Pre: 83 %
FEV1-Post: 2.28 L
FEV1-Pre: 2.18 L
FEV1FVC-%Change-Post: 0 %
FEV1FVC-%Pred-Pre: 112 %
FEV6-%Change-Post: 3 %
FEV6-%Pred-Post: 82 %
FEV6-%Pred-Pre: 79 %
FEV6-Post: 2.82 L
FEV6-Pre: 2.72 L
FEV6FVC-%Pred-Post: 107 %
FEV6FVC-%Pred-Pre: 107 %
FVC-%Change-Post: 3 %
FVC-%Pred-Post: 76 %
FVC-%Pred-Pre: 73 %
FVC-Post: 2.82 L
FVC-Pre: 2.72 L
Post FEV1/FVC ratio: 81 %
Post FEV6/FVC ratio: 100 %
Pre FEV1/FVC ratio: 80 %
Pre FEV6/FVC Ratio: 100 %
RV % pred: 82 %
RV: 2.1 L
TLC % pred: 74 %
TLC: 4.97 L

## 2021-03-22 NOTE — Progress Notes (Signed)
PFT done today. 

## 2021-03-23 ENCOUNTER — Encounter: Payer: Self-pay | Admitting: Internal Medicine

## 2021-03-24 ENCOUNTER — Encounter: Payer: Self-pay | Admitting: Internal Medicine

## 2021-04-11 NOTE — Progress Notes (Signed)
?Cardiology Office Note:   ? ?Date:  04/12/2021  ? ?ID:  Rodney [name], DOB 13-May-1940, MRN [id number] ? ?PCP:  Isaac Bliss, Rayford Halsted, MD  ?Cardiologist:  None  ? ?Referring MD: Isaac Bliss, [place]*  ? ?Chief Complaint  ?Patient presents with  ? Hypertension  ? Transient Ischemic Attack  ? ? ?History of Present Illness:   ? ?Rodney [name] is a 81 y.o. male with a hx of  diabetes mellitus type 2, hyperlipidemia, essential hypertension, Reiter's syndrome, who is referred by Dr. Loura Back.  [name] for evaluation of exertional chest pressure.Had cath which revealed. ? ?Hospitalized recently for sided hemiplegia and numbness that was transient.  He has been wearing a monitor for 3 weeks.  He brought it in today and is ready to turn in.  Based upon discharge instructions "Symptoms reportedly lasted less than an hour and resolved without recurrence. ?Likely small vessel disease etiology.  However per neurology, cannot rule out embolic to right MCA and recommend 30-day heart monitor.  I have contacted [facility] to arrange same". ? ? ?No symptoms or concerns since discharge.  Wore monitor for 4 weeks.  Discontinued at last night.  Results are obviously not back.  Treated with aspirin and Plavix for 18 days.  Complete resolution of symptoms.  Now only on aspirin 81 mg/day. ? ?Past Medical History:  ?Diagnosis Date  ? Bladder calculus   ? CAD (coronary artery disease)   ? non obs   ? CKD (chronic kidney disease), stage III ([place])   ? " i have 35-40% of my kidney function"; mgd by pcp   ? COLONIC POLYPS, HX OF 12/02/2008  ? DIABETES MELLITUS, TYPE II 07/19/2007  ? Dyspnea   ? reports sob over the last 2 years , underwent cath  in feb 2020 , reports cath was clean , referred to pulmonologist that stopped his Actos and encouraged weight loss  ; reports today minimum improvement in breathing , appt with pulm in June 2020  ? Erectile dysfunction   ? GERD (gastroesophageal reflux disease)   ? Glaucoma   ? GOUT  07/19/2007  ? History of kidney stones   ? HYPERLIPIDEMIA 12/02/2008  ? HYPERTENSION 07/19/2007  ? Kidney stones   ? Prostate cancer ([place])   ? Reiter syndrome   ? incomplete  ? Vitamin D deficiency   ? ? ?Past Surgical History:  ?Procedure Laterality Date  ? bladder neck dilation    ? CATARACT EXTRACTION    ? both eyes- 2015  ? COLONOSCOPY    ? CYSTOSCOPY N/A 06/11/2018  ? Procedure: CYSTOSCOPY WITH REMOVAL OF BLADDER CALCULUS;  Surgeon: [name], MD;  [facility];  Service: Urology;  Laterality: N/A;  ? ELBOW SURGERY    ? bone spur - right  ? LEFT HEART CATH AND CORONARY ANGIOGRAPHY N/A 03/19/2018  ? Procedure: LEFT HEART CATH AND CORONARY ANGIOGRAPHY;  Surgeon: Belva Crome, MD;  Location: [facility];  Service: Cardiovascular;  Laterality: N/A;  ? ORIF ANKLE FRACTURE Left 05/17/2015  ? Procedure: OPEN REDUCTION INTERNAL FIXATION (ORIF) ANKLE FRACTURE;  Surgeon: Meredith Pel, MD;  [facility]: [facility];  Service: Orthopedics;  Laterality: Left;  ? PROSTATECTOMY    ? URINARY SPHINCTER IMPLANT N/A 05/21/2019  ? Procedure: IMPLANTATION OF ARTIFICIAL URINARY SPHINCTER CYSTOSCOPY;  Surgeon: Bjorn Loser, MD;  [facility];  Service: Urology;  Laterality: N/A;  ? VASECTOMY    ? ? ?Current Medications: ?Current Meds  ?Medication  Sig  ? amLODipine (NORVASC) 5 MG tablet Take 1 tablet (5 mg total) by mouth 2 (two) times daily.  ? aspirin 81 MG chewable tablet Chew 1 tablet (81 mg total) by mouth daily.  ? atorvastatin (LIPITOR) 20 MG tablet TAKE 1 TABLET BY MOUTH AT BEDTIME . APPOINTMENT REQUIRED FOR FUTURE REFILLS  ? dorzolamide-timolol (COSOPT) 22.3-6.8 MG/ML ophthalmic solution Place 1 drop into both eyes every morning.  ? glipiZIDE (GLUCOTROL) 5 MG tablet Take 1 tablet (5 mg total) by mouth daily before breakfast.  ? glucose blood (ACCU-CHEK AVIVA) test strip 1 each by Other route 2 (two) times daily.  ? losartan (COZAAR) 50 MG tablet Take 1 tablet by mouth once daily (Patient taking differently:  Take 50 mg by mouth at bedtime.)  ? Vitamin D, Ergocalciferol, (DRISDOL) 1.25 MG (50000 UNIT) CAPS capsule Take 1 capsule (50,000 Units total) by mouth every 7 (seven) days for 12 doses. (Patient taking differently: Take 50,000 Units by mouth every Friday.)  ?  ? ?Allergies:   Ace inhibitors  ? ?Social History  ? ?Socioeconomic History  ? Marital status: Married  ?  Spouse name: Not on file  ? Number of children: Not on file  ? Years of education: Not on file  ? Highest education level: Not on file  ?Occupational History  ? Not on file  ?Tobacco Use  ? Smoking status: Former  ?  Packs/day: 1.00  ?  Years: 30.00  ?  Pack years: 30.00  ?  Types: Cigarettes  ?  Quit date: 01/24/1978  ?  Years since quitting: 43.2  ? Smokeless tobacco: Never  ?Vaping Use  ? Vaping Use: Never used  ?Substance and Sexual Activity  ? Alcohol use: Not Currently  ?  Comment: occasionally -socially  ? Drug use: No  ? Sexual activity: Not on file  ?  Comment: 2 childern  ?Other Topics Concern  ? Not on file  ?Social History Narrative  ? Not on file  ? ?Social Determinants of Health  ? ?Financial Resource Strain: Not on file  ?Food Insecurity: Not on file  ?Transportation Needs: Not on file  ?Physical Activity: Not on file  ?Stress: Not on file  ?Social Connections: Not on file  ?  ? ?Family History: ?The patient's family history includes Cancer in his sister. There is no history of Colon cancer, Esophageal cancer, Rectal cancer, Stomach cancer, or Lung disease. ? ?ROS:   ?Please see the history of present illness.    ?Had complete resolution of right-sided numbness and weakness.  He is doing well.  Ambulated him without difficulty.  Was on aspirin and Plavix for 18 days.  All other systems reviewed and are negative. ? ?EKGs/Labs/Other Studies Reviewed:   ? ?The following studies were reviewed today: ?ECHOCARDIOGRAM 03/19/2018: ?Diagnostic ?Dominance: Right ? ?Left main, proximal LAD and proximal circumflex calcification noted on cine  fluoroscopy. ?25% distal left main. ?LAD is widely patent in the ostium.  Beyond the first septal perforator and diagonal contains diffuse 40% narrowing.  The first diagonal contains diffuse mid vessel 50 to 60% narrowing. ?Ostial circumflex contains eccentric calcified 30 to 40% narrowing.  Obtuse marginal branches are widely patent.  The third obtuse marginal is dominant of the 4 marginal branches. ?Left ventriculography was of poor quality.  Anterior wall moves normally.  EF is estimated to be greater than 50%.  LVEDP was normal at 15 mmHg ? ?ECHOCARDIOGRAM 03/04/2021: ?IMPRESSIONS  ? 1. Left ventricular ejection fraction, by estimation, is 60  to 65%. The  ?left ventricle has normal function. The left ventricle has no regional  ?wall motion abnormalities. There is moderate concentric left ventricular  ?hypertrophy. Left ventricular  ?diastolic function could not be evaluated.  ? 2. Right ventricular systolic function is normal. The right ventricular  ?size is normal. There is normal pulmonary artery systolic pressure.  ? 3. The mitral valve is normal in structure. No evidence of mitral valve  ?regurgitation. No evidence of mitral stenosis.  ? 4. The aortic valve is tricuspid. There is mild calcification of the  ?aortic valve. There is mild thickening of the aortic valve. Aortic valve  ?regurgitation is not visualized. No aortic stenosis is present.  ? 5. The inferior vena cava is normal in size with greater than 50%  ?respiratory variability, suggesting right atrial pressure of 3 mmHg.  ? ?Comparison(s): No significant change from prior study.  ? ?Conclusion(s)/Recommendation(s): Otherwise normal echocardiogram, with  ?minor abnormalities described in the report.  ? ?EKG:  EKG 03/06/2021 revealed normal sinus rhythm with left anterior hemiblock. ? ?Recent Labs: ?02/15/2021: TSH 4.29 ?03/04/2021: ALT 25; BUN 18; Creatinine, Ser 1.74; Hemoglobin 13.3; Platelets 226; Potassium 4.2; Sodium 138  ?Recent Lipid Panel ?    ?Component Value Date/Time  ? CHOL 97 03/05/2021 0122  ? TRIG 86 03/05/2021 0122  ? HDL 22 (L) 03/05/2021 0122  ? CHOLHDL 4.4 03/05/2021 0122  ? VLDL 17 03/05/2021 0122  ? Lantana 58 03/05/2021 0122  ? [place] 61 09/11/18

## 2021-04-12 ENCOUNTER — Ambulatory Visit: Payer: Medicare Other | Admitting: Interventional Cardiology

## 2021-04-12 ENCOUNTER — Other Ambulatory Visit: Payer: Self-pay

## 2021-04-12 ENCOUNTER — Encounter: Payer: Self-pay | Admitting: Interventional Cardiology

## 2021-04-12 VITALS — BP 122/68 | HR 77 | Ht 68.0 in | Wt 227.8 lb

## 2021-04-12 DIAGNOSIS — I10 Essential (primary) hypertension: Secondary | ICD-10-CM | POA: Diagnosis not present

## 2021-04-12 DIAGNOSIS — E1122 Type 2 diabetes mellitus with diabetic chronic kidney disease: Secondary | ICD-10-CM

## 2021-04-12 DIAGNOSIS — E785 Hyperlipidemia, unspecified: Secondary | ICD-10-CM

## 2021-04-12 DIAGNOSIS — N1832 Chronic kidney disease, stage 3b: Secondary | ICD-10-CM | POA: Diagnosis not present

## 2021-04-12 DIAGNOSIS — G459 Transient cerebral ischemic attack, unspecified: Secondary | ICD-10-CM | POA: Diagnosis not present

## 2021-04-12 DIAGNOSIS — R0609 Other forms of dyspnea: Secondary | ICD-10-CM

## 2021-04-12 DIAGNOSIS — C61 Malignant neoplasm of prostate: Secondary | ICD-10-CM

## 2021-04-12 NOTE — Patient Instructions (Signed)
Medication Instructions:  ?Your physician recommends that you continue on your current medications as directed. Please refer to the Current Medication list given to you today. ? ?*If you need a refill on your cardiac medications before your next appointment, please call your pharmacy* ? ? ?Lab Work: ?None ?If you have labs (blood work) drawn today and your tests are completely normal, you will receive your results only by: ?MyChart Message (if you have MyChart) OR ?A paper copy in the mail ?If you have any lab test that is abnormal or we need to change your treatment, we will call you to review the results. ? ? ?Testing/Procedures: ?None ? ? ?Follow-Up: ?At [facility], you and your health needs are our priority.  As part of our continuing mission to provide you with exceptional heart care, we have created designated Provider Care Teams.  These Care Teams include your primary Cardiologist (physician) and Advanced Practice Providers (APPs -  Physician Assistants and Nurse Practitioners) who all work together to provide you with the care you need, when you need it. ? ?We recommend signing up for the patient portal called "MyChart".  Sign up information is provided on this After Visit Summary.  MyChart is used to connect with patients for Virtual Visits (Telemedicine).  Patients are able to view lab/test results, encounter notes, upcoming appointments, etc.  Non-urgent messages can be sent to your provider as well.   ?To learn more about what you can do with MyChart, go to [email or web address].   ? ?Your next appointment:   ?As needed ? ?The format for your next appointment:   ?In Person ? ?Provider:   ?[facility]. Blenda Bridegroom, MD  ? ? ?Other Instructions ?  ?

## 2021-04-22 ENCOUNTER — Inpatient Hospital Stay: Payer: Medicare Other | Admitting: Adult Health

## 2021-04-26 ENCOUNTER — Ambulatory Visit: Payer: Medicare Other | Admitting: Interventional Cardiology

## 2021-05-17 ENCOUNTER — Ambulatory Visit (INDEPENDENT_AMBULATORY_CARE_PROVIDER_SITE_OTHER): Payer: Medicare Other | Admitting: Internal Medicine

## 2021-05-17 ENCOUNTER — Encounter: Payer: Self-pay | Admitting: Internal Medicine

## 2021-05-17 VITALS — BP 130/80 | HR 76 | Temp 97.6°F | Wt 224.3 lb

## 2021-05-17 DIAGNOSIS — E038 Other specified hypothyroidism: Secondary | ICD-10-CM | POA: Diagnosis not present

## 2021-05-17 DIAGNOSIS — M0230 Reiter's disease, unspecified site: Secondary | ICD-10-CM | POA: Diagnosis not present

## 2021-05-17 DIAGNOSIS — E785 Hyperlipidemia, unspecified: Secondary | ICD-10-CM

## 2021-05-17 DIAGNOSIS — N1832 Chronic kidney disease, stage 3b: Secondary | ICD-10-CM | POA: Diagnosis not present

## 2021-05-17 DIAGNOSIS — I10 Essential (primary) hypertension: Secondary | ICD-10-CM

## 2021-05-17 DIAGNOSIS — Z86010 Personal history of colonic polyps: Secondary | ICD-10-CM | POA: Diagnosis not present

## 2021-05-17 DIAGNOSIS — E1122 Type 2 diabetes mellitus with diabetic chronic kidney disease: Secondary | ICD-10-CM

## 2021-05-17 DIAGNOSIS — C61 Malignant neoplasm of prostate: Secondary | ICD-10-CM

## 2021-05-17 NOTE — Progress Notes (Signed)
? ? ? ?Established Patient Office Visit ? ? ? ? ?This visit occurred during the SARS-CoV-2 public health emergency.  Safety protocols were in place, including screening questions prior to the visit, additional usage of staff PPE, and extensive cleaning of exam room while observing appropriate contact time as indicated for disinfecting solutions.  ? ? ?CC/Reason for Visit: Follow-up chronic medical conditions ? ?HPI: RANGER PETRICH is a 81 y.o. male who is coming in today for the above mentioned reasons. Past Medical History is significant for: Hypertension, type 2 diabetes, hyperlipidemia, chronic kidney disease stage III, coronary artery disease and a Reiter's syndrome with uveitis/iritis.  He also has a history of colon polyps and his last colonoscopy in 2016 demonstrated 8 polyps and a 5-year follow-up was advised, he is now overdue.  I last saw him in February after hospital admission for a TIA.  He did wear a cardiac monitor for 4 weeks that did not show evidence for atrial fibrillation or any other embolic arrhythmia.  He has no acute concerns or complaints today. ? ? ?Past Medical/Surgical History: ?Past Medical History:  ?Diagnosis Date  ? Bladder calculus   ? CAD (coronary artery disease)   ? non obs   ? CKD (chronic kidney disease), stage III ([place])   ? " i have 35-40% of my kidney function"; mgd by pcp   ? COLONIC POLYPS, HX OF 12/02/2008  ? DIABETES MELLITUS, TYPE II 07/19/2007  ? Dyspnea   ? reports sob over the last 2 years , underwent cath  in feb 2020 , reports cath was clean , referred to pulmonologist that stopped his Actos and encouraged weight loss  ; reports today minimum improvement in breathing , appt with pulm in June 2020  ? Erectile dysfunction   ? GERD (gastroesophageal reflux disease)   ? Glaucoma   ? GOUT 07/19/2007  ? History of kidney stones   ? HYPERLIPIDEMIA 12/02/2008  ? HYPERTENSION 07/19/2007  ? Kidney stones   ? Prostate cancer ([place])   ? Reiter syndrome   ? incomplete  ? Vitamin D  deficiency   ? ? ?Past Surgical History:  ?Procedure Laterality Date  ? bladder neck dilation    ? CATARACT EXTRACTION    ? both eyes- 2015  ? COLONOSCOPY    ? CYSTOSCOPY N/A 06/11/2018  ? Procedure: CYSTOSCOPY WITH REMOVAL OF BLADDER CALCULUS;  Surgeon: [name], MD;  [facility];  Service: Urology;  Laterality: N/A;  ? ELBOW SURGERY    ? bone spur - right  ? LEFT HEART CATH AND CORONARY ANGIOGRAPHY N/A 03/19/2018  ? Procedure: LEFT HEART CATH AND CORONARY ANGIOGRAPHY;  Surgeon: Belva Crome, MD;  Location: [facility];  Service: Cardiovascular;  Laterality: N/A;  ? ORIF ANKLE FRACTURE Left 05/17/2015  ? Procedure: OPEN REDUCTION INTERNAL FIXATION (ORIF) ANKLE FRACTURE;  Surgeon: Meredith Pel, MD;  [facility];  Service: Orthopedics;  Laterality: Left;  ? PROSTATECTOMY    ? URINARY SPHINCTER IMPLANT N/A 05/21/2019  ? Procedure: IMPLANTATION OF ARTIFICIAL URINARY SPHINCTER CYSTOSCOPY;  Surgeon: Bjorn Loser, MD;  [facility];  Service: Urology;  Laterality: N/A;  ? VASECTOMY    ? ? ?Social History: ? reports that he quit smoking about 43 years ago. His smoking use included cigarettes. He has a 30.00 pack-year smoking history. He has never used smokeless tobacco. He reports that he does not currently use alcohol. He reports that he does not use drugs. ? ?Allergies: ?Allergies  ?  Allergen Reactions  ? Ace Inhibitors Swelling  ?  Tongue swelling. Reaction to benazepril only.   ? ? ?Family History:  ?Family History  ?Problem Relation Age of Onset  ? Cancer Sister   ?     metastatic colon ca  ? Colon cancer Neg Hx   ? Esophageal cancer Neg Hx   ? Rectal cancer Neg Hx   ? Stomach cancer Neg Hx   ? Lung disease Neg Hx   ? ? ? ?Current Outpatient Medications:  ?  amLODipine (NORVASC) 5 MG tablet, Take 1 tablet (5 mg total) by mouth 2 (two) times daily., Disp: 180 tablet, Rfl: 1 ?  aspirin 81 MG chewable tablet, Chew 1 tablet (81 mg total) by mouth daily., Disp: 30 tablet, Rfl: 1 ?   atorvastatin (LIPITOR) 20 MG tablet, TAKE 1 TABLET BY MOUTH AT BEDTIME . APPOINTMENT REQUIRED FOR FUTURE REFILLS, Disp: 90 tablet, Rfl: 0 ?  dorzolamide-timolol (COSOPT) 22.3-6.8 MG/ML ophthalmic solution, Place 1 drop into both eyes every morning., Disp: , Rfl:  ?  glipiZIDE (GLUCOTROL) 5 MG tablet, Take 1 tablet (5 mg total) by mouth daily before breakfast., Disp: 90 tablet, Rfl: 1 ?  glucose blood (ACCU-CHEK AVIVA) test strip, 1 each by Other route 2 (two) times daily., Disp: 100 each, Rfl: 3 ?  losartan (COZAAR) 50 MG tablet, Take 1 tablet by mouth once daily (Patient taking differently: Take 50 mg by mouth at bedtime.), Disp: 90 tablet, Rfl: 0 ? ?Review of Systems:  ?Constitutional: Denies fever, chills, diaphoresis, appetite change and fatigue.  ?HEENT: Denies photophobia, eye pain, redness, hearing loss, ear pain, congestion, sore throat, rhinorrhea, sneezing, mouth sores, trouble swallowing, neck pain, neck stiffness and tinnitus.   ?Respiratory: Denies SOB, DOE, cough, chest tightness,  and wheezing.   ?Cardiovascular: Denies chest pain, palpitations and leg swelling.  ?Gastrointestinal: Denies nausea, vomiting, abdominal pain, diarrhea, constipation, blood in stool and abdominal distention.  ?Genitourinary: Denies dysuria, urgency, frequency, hematuria, flank pain and difficulty urinating.  ?Endocrine: Denies: hot or cold intolerance, sweats, changes in hair or nails, polyuria, polydipsia. ?Musculoskeletal: Denies myalgias, back pain, joint swelling, arthralgias and gait problem.  ?Skin: Denies pallor, rash and wound.  ?Neurological: Denies dizziness, seizures, syncope, weakness, light-headedness, numbness and headaches.  ?Hematological: Denies adenopathy. Easy bruising, personal or family bleeding history  ?Psychiatric/Behavioral: Denies suicidal ideation, mood changes, confusion, nervousness, sleep disturbance and agitation ? ? ? ?Physical Exam: ?Vitals:  ? 05/17/21 0659  ?BP: 130/80  ?Pulse: 76  ?Temp:  97.6 ?F (36.4 ?C)  ?TempSrc: Oral  ?SpO2: 100%  ?Weight: 224 lb 4.8 oz (101.7 kg)  ? ? ?Body mass index is 34.1 kg/m?. ? ? ?Constitutional: NAD, calm, comfortable ?Eyes: PERRL, lids and conjunctivae normal, wears corrective lenses ?ENMT: Mucous membranes are moist.  ?Respiratory: clear to auscultation bilaterally, no wheezing, no crackles. Normal respiratory effort. No accessory muscle use.  ?Cardiovascular: Regular rate and rhythm, no murmurs / rubs / gallops. No extremity edema.  ?Neurologic: Grossly intact and nonfocal ?Psychiatric: Normal judgment and insight. Alert and oriented x 3. Normal mood.  ? ? ?Impression and Plan: ? ?Dyslipidemia ?-Lipids in February 2023 with a total cholesterol of 97, LDL 58 and triglycerides 86. ? ?Type 2 diabetes mellitus with stage 3b chronic kidney disease, without long-term current use of insulin (HCC) ?-A1c demonstrated good control at 6.5 in February 2023 ? ?Essential hypertension ?-Fair control. ? ?Stage 3b chronic kidney disease ([place]) ?-Baseline creatinine around 1.7 ? ?Subclinical hypothyroidism ?-Check TSH next visit ? ?  Prostate cancer ([place]) ?-Noted. ? ?Reiter's disease ([place]) ?-Noted, has routine ophthalmology exams. ? ? ? ?Time spent:30 minutes reviewing chart, interviewing and examining patient and formulating plan of care. ? ? ? ? ?Lelon Frohlich, MD ?[facility] at [facility] ? ? ?

## 2021-05-26 ENCOUNTER — Other Ambulatory Visit: Payer: Self-pay | Admitting: Internal Medicine

## 2021-05-26 DIAGNOSIS — E785 Hyperlipidemia, unspecified: Secondary | ICD-10-CM

## 2021-05-26 DIAGNOSIS — I10 Essential (primary) hypertension: Secondary | ICD-10-CM

## 2021-06-29 ENCOUNTER — Encounter: Payer: Self-pay | Admitting: Internal Medicine

## 2021-08-17 ENCOUNTER — Encounter: Payer: Self-pay | Admitting: *Deleted

## 2021-08-23 ENCOUNTER — Other Ambulatory Visit: Payer: Self-pay | Admitting: Internal Medicine

## 2021-08-23 DIAGNOSIS — N183 Type 2 diabetes mellitus with diabetic chronic kidney disease: Secondary | ICD-10-CM

## 2021-08-30 ENCOUNTER — Encounter: Payer: Self-pay | Admitting: Internal Medicine

## 2021-08-30 ENCOUNTER — Ambulatory Visit (INDEPENDENT_AMBULATORY_CARE_PROVIDER_SITE_OTHER): Payer: Medicare Other | Admitting: Internal Medicine

## 2021-08-30 VITALS — BP 130/70 | HR 82 | Temp 97.8°F | Wt 224.9 lb

## 2021-08-30 DIAGNOSIS — Z86010 Personal history of colonic polyps: Secondary | ICD-10-CM

## 2021-08-30 DIAGNOSIS — E785 Hyperlipidemia, unspecified: Secondary | ICD-10-CM | POA: Diagnosis not present

## 2021-08-30 DIAGNOSIS — E1122 Type 2 diabetes mellitus with diabetic chronic kidney disease: Secondary | ICD-10-CM

## 2021-08-30 DIAGNOSIS — N1832 Chronic kidney disease, stage 3b: Secondary | ICD-10-CM

## 2021-08-30 DIAGNOSIS — I10 Essential (primary) hypertension: Secondary | ICD-10-CM | POA: Diagnosis not present

## 2021-08-30 LAB — MICROALBUMIN / CREATININE URINE RATIO
Creatinine,U: 110.5 mg/dL
Microalb Creat Ratio: 1.9 mg/g (ref 0.0–30.0)
Microalb, Ur: 2.1 mg/dL — ABNORMAL HIGH (ref 0.0–1.9)

## 2021-08-30 LAB — POCT GLYCOSYLATED HEMOGLOBIN (HGB A1C): Hemoglobin A1C: 6.9 % — AB (ref 4.0–5.6)

## 2021-08-30 NOTE — Progress Notes (Signed)
Established Patient Office Visit     CC/Reason for Visit: 62-monthfollow-up chronic medical conditions  HPI: Rodney SMOAKis a 81y.o. male who is coming in today for the above mentioned reasons. Past Medical History is significant for: Hypertension, type 2 diabetes, hyperlipidemia, chronic kidney disease stage III, coronary artery disease and a Reiter's syndrome with uveitis/iritis.  He also has a history of colon polyps and his last colonoscopy in 2016 demonstrated 8 polyps and a 5-year follow-up was advised, he is now overdue.  He has appointment with GI for this week.  He is otherwise doing well and has no acute concerns or complaints.  He follows with his ophthalmologist routinely.   Past Medical/Surgical History: Past Medical History:  Diagnosis Date   Bladder calculus    CAD (coronary artery disease)    non obs    CKD (chronic kidney disease), stage III ([place]    " i have 35-40% of my kidney function"; mgd by pcp    COLONIC POLYPS, HX OF 12/02/2008   DIABETES MELLITUS, TYPE II 07/19/2007   Dyspnea    reports sob over the last 2 years , underwent cath  in feb 2020 , reports cath was clean , referred to pulmonologist that stopped his Actos and encouraged weight loss  ; reports today minimum improvement in breathing , appt with pulm in June 2020   Erectile dysfunction    GERD (gastroesophageal reflux disease)    Glaucoma    GOUT 07/19/2007   History of kidney stones    HYPERLIPIDEMIA 12/02/2008   HYPERTENSION 07/19/2007   Kidney stones    [place]    Reiter syndrome    incomplete   TIA (transient ischemic attack)    Tubular adenoma of colon    Vitamin D deficiency     Past Surgical History:  Procedure Laterality Date   bladder neck dilation     CATARACT EXTRACTION     both eyes- 2015   COLONOSCOPY     CYSTOSCOPY N/A 06/11/2018   Procedure: CYSTOSCOPY WITH REMOVAL OF BLADDER CALCULUS;  Surgeon: BRaynelle Bring MD;  [facility];  Service:  Urology;  Laterality: N/A;   ELBOW SURGERY     bone spur - right   LEFT HEART CATH AND CORONARY ANGIOGRAPHY N/A 03/19/2018   Procedure: LEFT HEART CATH AND CORONARY ANGIOGRAPHY;  Surgeon: SBelva Crome MD;  [facility];  Service: Cardiovascular;  Laterality: N/A;   ORIF ANKLE FRACTURE Left 05/17/2015   Procedure: OPEN REDUCTION INTERNAL FIXATION (ORIF) ANKLE FRACTURE;  Surgeon: SMeredith Pel MD;  [facility];  Service: Orthopedics;  Laterality: Left;   PROSTATECTOMY     URINARY SPHINCTER IMPLANT N/A 05/21/2019   Procedure: IMPLANTATION OF ARTIFICIAL URINARY SPHINCTER CYSTOSCOPY;  Surgeon: MBjorn Loser MD;  [facility];  Service: Urology;  Laterality: N/A;   VASECTOMY      Social History:  reports that he quit smoking about 43 years ago. His smoking use included cigarettes. He has a 30.00 pack-year smoking history. He has never used smokeless tobacco. He reports that he does not currently use alcohol. He reports that he does not use drugs.  Allergies: Allergies  Allergen Reactions   Ace Inhibitors Swelling    Tongue swelling. Reaction to benazepril only.     Family History:  Family History  Problem Relation Age of Onset   Cancer Sister        metastatic colon ca  Colon cancer Neg Hx    Esophageal cancer Neg Hx    Rectal cancer Neg Hx    Stomach cancer Neg Hx    Lung disease Neg Hx      Current Outpatient Medications:    amLODipine (NORVASC) 5 MG tablet, Take 1 tablet (5 mg total) by mouth 2 (two) times daily., Disp: 180 tablet, Rfl: 1   aspirin 81 MG chewable tablet, Chew 1 tablet (81 mg total) by mouth daily., Disp: 30 tablet, Rfl: 1   atorvastatin (LIPITOR) 20 MG tablet, TAKE 1 TABLET BY MOUTH AT BEDTIME. APPOINTMENT REQUIRED FOR FUTURE REFILLS, Disp: 90 tablet, Rfl: 0   dorzolamide-timolol (COSOPT) 22.3-6.8 MG/ML ophthalmic solution, Place 1 drop into both eyes every morning., Disp: , Rfl:    glipiZIDE (GLUCOTROL) 5 MG tablet, TAKE 1  TABLET BY MOUTH ONCE DAILY BEFORE BREAKFAST, Disp: 90 tablet, Rfl: 0   glucose blood (ACCU-CHEK AVIVA) test strip, 1 each by Other route 2 (two) times daily., Disp: 100 each, Rfl: 3   losartan (COZAAR) 50 MG tablet, Take 1 tablet (50 mg total) by mouth at bedtime., Disp: 90 tablet, Rfl: 0  Review of Systems:  Constitutional: Denies fever, chills, diaphoresis, appetite change and fatigue.  HEENT: Denies photophobia, eye pain, redness, hearing loss, ear pain, congestion, sore throat, rhinorrhea, sneezing, mouth sores, trouble swallowing, neck pain, neck stiffness and tinnitus.   Respiratory: Denies SOB, DOE, cough, chest tightness,  and wheezing.   Cardiovascular: Denies chest pain, palpitations and leg swelling.  Gastrointestinal: Denies nausea, vomiting, abdominal pain, diarrhea, constipation, blood in stool and abdominal distention.  Genitourinary: Denies dysuria, urgency, frequency, hematuria, flank pain and difficulty urinating.  Endocrine: Denies: hot or cold intolerance, sweats, changes in hair or nails, polyuria, polydipsia. Musculoskeletal: Denies myalgias, back pain, joint swelling, arthralgias and gait problem.  Skin: Denies pallor, rash and wound.  Neurological: Denies dizziness, seizures, syncope, weakness, light-headedness, numbness and headaches.  Hematological: Denies adenopathy. Easy bruising, personal or family bleeding history  Psychiatric/Behavioral: Denies suicidal ideation, mood changes, confusion, nervousness, sleep disturbance and agitation    Physical Exam: Vitals:   08/30/21 0724  BP: 130/70  Pulse: 82  Temp: 97.8 F (36.6 C)  TempSrc: Oral  SpO2: 99%  Weight: 224 lb 14.4 oz (102 kg)    Body mass index is 34.2 kg/m.   Constitutional: NAD, calm, comfortable Eyes: PERRL, lids and conjunctivae normal, wears corrective lenses ENMT: Mucous membranes are moist.  Respiratory: clear to auscultation bilaterally, no wheezing, no crackles. Normal respiratory  effort. No accessory muscle use.  Cardiovascular: Regular rate and rhythm, no murmurs / rubs / gallops. No extremity edema.  Psychiatric: Normal judgment and insight. Alert and oriented x 3. Normal mood.    Impression and Plan:  Type 2 diabetes mellitus with stage 3b chronic kidney disease, without long-term current use of insulin ([place])  - Plan: POCT glycosylated hemoglobin (Hb A1C), Urine microalbumin-creatinine with uACR -A1c is at goal at 6.4 today. -Check microalbumin. -He follows with ophthalmology routinely.  Dyslipidemia -At goal with an LDL of 58 on last check.  Currently on atorvastatin 20 mg.  Essential hypertension -Fair control on losartan, amlodipine.  History of colonic polyps -Has appointment with GI next week to consider repeat colonoscopy.    Time spent:32 minutes reviewing chart, interviewing and examining patient and formulating plan of care.    Lelon Frohlich, MD [facility] at [facility]

## 2021-09-01 ENCOUNTER — Ambulatory Visit: Payer: Medicare Other | Admitting: Internal Medicine

## 2021-09-01 ENCOUNTER — Other Ambulatory Visit: Payer: Self-pay | Admitting: Internal Medicine

## 2021-09-01 ENCOUNTER — Encounter: Payer: Self-pay | Admitting: Internal Medicine

## 2021-09-01 VITALS — BP 96/60 | HR 100 | Ht 68.0 in | Wt 223.2 lb

## 2021-09-01 DIAGNOSIS — Z86010 Personal history of colonic polyps: Secondary | ICD-10-CM

## 2021-09-01 DIAGNOSIS — I2510 Atherosclerotic heart disease of native coronary artery without angina pectoris: Secondary | ICD-10-CM | POA: Diagnosis not present

## 2021-09-01 DIAGNOSIS — E785 Hyperlipidemia, unspecified: Secondary | ICD-10-CM

## 2021-09-01 DIAGNOSIS — I10 Essential (primary) hypertension: Secondary | ICD-10-CM

## 2021-09-01 DIAGNOSIS — Z8673 Personal history of transient ischemic attack (TIA), and cerebral infarction without residual deficits: Secondary | ICD-10-CM | POA: Diagnosis not present

## 2021-09-01 MED ORDER — NA SULFATE-K SULFATE-MG SULF 17.5-3.13-1.6 GM/177ML PO SOLN: 1.0000 | Freq: Once | ORAL | 0 refills | Status: AC

## 2021-09-01 NOTE — Patient Instructions (Signed)
You have been scheduled for a colonoscopy. Please follow written instructions given to you at your visit today.  Please pick up your prep supplies at the pharmacy within the next 1-3 days. If you use inhalers (even only as needed), please bring them with you on the day of your procedure.  The [facility] providers would like to encourage you to use MYCHART to communicate with providers for non-urgent requests or questions.  Due to long hold times on the telephone, sending your provider a message by MYCHART may be a faster and more efficient way to get a response.  Please allow 48 business hours for a response.  Please remember that this is for non-urgent requests.   Due to recent changes in healthcare laws, you may see the results of your imaging and laboratory studies on MyChart before your provider has had a chance to review them.  We understand that in some cases there may be results that are confusing or concerning to you. Not all laboratory results come back in the same time frame and the provider may be waiting for multiple results in order to interpret others.  Please give us 48 hours in order for your provider to thoroughly review all the results before contacting the office for clarification of your results.    

## 2021-09-01 NOTE — Progress Notes (Signed)
Patient ID: Rodney [name], male   DOB: [birth year], 81 y.o.   MRN: [id number] HPI: Rodney [name] is an 81 year old male known to me with a history of multiple adenomatous colon polyps, history of GERD, nonobstructive CAD, diabetes, hypertension, hyperlipidemia, prostate cancer and prior TIA who is here to discuss polyp surveillance and colonoscopy.  He is here alone today.  He reports from a GI perspective he is doing very well.  Occasionally he will have constipation and uses single Dulcolax which relieves symptoms.  This is much less than once per week.  No abdominal pain.  No blood in stool or melena.  No change in bowel habits.  He denies upper GI and hepatobiliary complaint.  No dysphagia or odynophagia.  He remains very active.  He denies chest pain and shortness of breath.  He had a TIA earlier this year took a very short course of Plavix for less than 3 weeks.  Imaging, neurology and cardiology consultations all reassuring.  Nonobstructive CAD and normal cardiac function by echocardiogram.  His last colonoscopy was done in November 2016.  8 adenomas were removed at that time and 3-year recall was recommended for surveillance.  Past Medical History:  Diagnosis Date   Bladder calculus    CAD (coronary artery disease)    non obs    CKD (chronic kidney disease), stage III ([place])    " i have 35-40% of my kidney function"; mgd by pcp    COLONIC POLYPS, HX OF 12/02/2008   DIABETES MELLITUS, TYPE II 07/19/2007   Dyspnea    reports sob over the last 2 years , underwent cath  in feb 2020 , reports cath was clean , referred to pulmonologist that stopped his Actos and encouraged weight loss  ; reports today minimum improvement in breathing , appt with pulm in June 2020   Erectile dysfunction    GERD (gastroesophageal reflux disease)    Glaucoma    GOUT 07/19/2007   History of kidney stones    HYPERLIPIDEMIA 12/02/2008   HYPERTENSION 07/19/2007   Kidney stones    Prostate cancer ([place])     Reiter syndrome    incomplete   TIA (transient ischemic attack)    Tubular adenoma of colon    Vitamin D deficiency     Past Surgical History:  Procedure Laterality Date   bladder neck dilation     CATARACT EXTRACTION     both eyes- 2015   COLONOSCOPY     CYSTOSCOPY N/A 06/11/2018   Procedure: CYSTOSCOPY WITH REMOVAL OF BLADDER CALCULUS;  Surgeon: [name], MD;  [facility];  Service: Urology;  Laterality: N/A;   ELBOW SURGERY     bone spur - right   LEFT HEART CATH AND CORONARY ANGIOGRAPHY N/A 03/19/2018   Procedure: LEFT HEART CATH AND CORONARY ANGIOGRAPHY;  Surgeon: Belva Crome, MD;  Location: [facility];  Service: Cardiovascular;  Laterality: N/A;   ORIF ANKLE FRACTURE Left 05/17/2015   Procedure: OPEN REDUCTION INTERNAL FIXATION (ORIF) ANKLE FRACTURE;  Surgeon: Meredith Pel, MD;  [facility];  Service: Orthopedics;  Laterality: Left;   PROSTATECTOMY     URINARY SPHINCTER IMPLANT N/A 05/21/2019   Procedure: IMPLANTATION OF ARTIFICIAL URINARY SPHINCTER CYSTOSCOPY;  Surgeon: Bjorn Loser, MD;  [facility];  Service: Urology;  Laterality: N/A;   VASECTOMY      Outpatient Medications Prior to Visit  Medication Sig Dispense Refill   amLODipine (NORVASC) 5 MG tablet Take 1 tablet (5 mg  total) by mouth 2 (two) times daily. 180 tablet 1   aspirin 81 MG chewable tablet Chew 1 tablet (81 mg total) by mouth daily. 30 tablet 1   atorvastatin (LIPITOR) 20 MG tablet TAKE 1 TABLET BY MOUTH AT BEDTIME. APPOINTMENT REQUIRED FOR FUTURE REFILLS 90 tablet 0   dorzolamide-timolol (COSOPT) 22.3-6.8 MG/ML ophthalmic solution Place 1 drop into both eyes every morning.     glipiZIDE (GLUCOTROL) 5 MG tablet TAKE 1 TABLET BY MOUTH ONCE DAILY BEFORE BREAKFAST 90 tablet 0   glucose blood (ACCU-CHEK AVIVA) test strip 1 each by Other route 2 (two) times daily. 100 each 3   losartan (COZAAR) 50 MG tablet Take 1 tablet (50 mg total) by mouth at bedtime. 90 tablet 0   No  facility-administered medications prior to visit.    Allergies  Allergen Reactions   Ace Inhibitors Swelling    Tongue swelling. Reaction to benazepril only.     Family History  Problem Relation Age of Onset   Cancer Sister        metastatic colon ca   Colon cancer Neg Hx    Esophageal cancer Neg Hx    Rectal cancer Neg Hx    Stomach cancer Neg Hx    Lung disease Neg Hx    Colon polyps Neg Hx    Pancreatic cancer Neg Hx     Social History   Tobacco Use   Smoking status: Former    Packs/day: 1.00    Years: 30.00    Total pack years: 30.00    Types: Cigarettes    Quit date: 01/24/1978    Years since quitting: 43.6   Smokeless tobacco: Never  Vaping Use   Vaping Use: Never used  Substance Use Topics   Alcohol use: Not Currently    Comment: occasionally -socially   Drug use: No    ROS: As per history of present illness, otherwise negative  BP 96/60   Pulse 100   Ht '5\' 8"'$  (1.727 m)   Wt 223 lb 4 oz (101.3 kg)   BMI 33.95 kg/m  Gen: awake, alert, NAD HEENT: anicteric CV: RRR, no mrg Pulm: CTA b/l Abd: soft, obese, NT/ND, +BS throughout Ext: no c/c/e Neuro: nonfocal   RELEVANT LABS AND IMAGING: CBC    Component Value Date/Time   WBC 7.2 03/04/2021 0842   RBC 4.02 (L) 03/04/2021 0842   HGB 13.3 03/04/2021 0842   HGB 11.6 (L) 03/13/2018 0844   HCT 40.1 03/04/2021 0842   HCT 34.9 (L) 03/13/2018 0844   PLT 226 03/04/2021 0842   PLT 280 03/13/2018 0844   MCV 99.8 03/04/2021 0842   MCV 100 (H) 03/13/2018 0844   MCH 33.1 03/04/2021 0842   MCHC 33.2 03/04/2021 0842   RDW 13.1 03/04/2021 0842   RDW 12.3 03/13/2018 0844   LYMPHSABS 0.9 03/04/2021 0842   MONOABS 0.5 03/04/2021 0842   EOSABS 0.1 03/04/2021 0842   BASOSABS 0.0 03/04/2021 0842    CMP     Component Value Date/Time   NA 138 03/04/2021 0842   NA 141 03/13/2018 0844   K 4.2 03/04/2021 0842   CL 105 03/04/2021 0842   CO2 22 03/04/2021 0842   GLUCOSE 171 (H) 03/04/2021 0842   BUN 18  03/04/2021 0842   BUN 22 03/13/2018 0844   CREATININE 1.74 (H) 03/04/2021 0842   CREATININE 1.71 (H) 09/11/2019 0932   CALCIUM 9.2 03/04/2021 0842   PROT 6.6 03/04/2021 0842   ALBUMIN 4.0 03/04/2021 0842  AST 21 03/04/2021 0842   ALT 25 03/04/2021 0842   ALKPHOS 85 03/04/2021 0842   BILITOT 1.2 03/04/2021 0842   GFRNONAA 39 (L) 03/04/2021 0842   GFRAA 33 (L) 05/22/2019 0445    ASSESSMENT/PLAN: 81 year old male known to me with a history of multiple adenomatous colon polyps, history of GERD, nonobstructive CAD, diabetes, hypertension, hyperlipidemia, prostate cancer and prior TIA who is here to discuss polyp surveillance and colonoscopy.   Personal history of multiple adenomatous colon polyps --he is 81 years old but medically doing well.  He had a full neurology and cardiology evaluation earlier this year and has not had any further TIA symptoms.  He is not on anticoagulation or antiplatelet other than a baby aspirin.  We reviewed the risk and benefits of colonoscopy as it relates particularly to age.  We discussed the guidelines suggest discontinuation of screening around age 50 but this is an individualized discussion.  He certainly is higher than average risk with 8 adenomas removed at last colonoscopy.  After our discussion of the risk, benefits and alternatives he wishes to proceed with colonoscopy later this year preferably in October.  He has been helping his wife who has been dealing with vertebral fractures requiring kyphoplasty. --Surveillance colonoscopy in the [place] in late October  2.  Nonobstructive CAD --follows with cardiology  3.  TIA --significant cerebrovascular disease excluded by imaging.  Following with neurology.  No recent recurrent TIA.      ZD:[id number] Acosta, [place], [place] [address] [place],  [place] [address]

## 2021-10-04 ENCOUNTER — Other Ambulatory Visit: Payer: Self-pay | Admitting: Internal Medicine

## 2021-10-04 DIAGNOSIS — I10 Essential (primary) hypertension: Secondary | ICD-10-CM

## 2021-10-13 LAB — PSA: PSA: 26.9

## 2021-10-19 ENCOUNTER — Telehealth: Payer: Self-pay | Admitting: Internal Medicine

## 2021-10-19 NOTE — Telephone Encounter (Signed)
Pt would like to transfer to the [facility] office to Dr Cherlynn Kaiser. Please advise

## 2021-10-22 ENCOUNTER — Other Ambulatory Visit (HOSPITAL_COMMUNITY): Payer: Self-pay | Admitting: Urology

## 2021-10-22 DIAGNOSIS — C61 Malignant neoplasm of prostate: Secondary | ICD-10-CM

## 2021-10-26 ENCOUNTER — Encounter: Payer: Self-pay | Admitting: Internal Medicine

## 2021-10-28 ENCOUNTER — Encounter (HOSPITAL_COMMUNITY)
Admission: RE | Admit: 2021-10-28 | Discharge: 2021-10-28 | Disposition: A | Discharge status: Home / Self Care | Payer: Medicare Other | Source: Ambulatory Visit | Attending: Urology | Admitting: Urology

## 2021-10-28 DIAGNOSIS — C61 Malignant neoplasm of prostate: Secondary | ICD-10-CM | POA: Diagnosis present

## 2021-10-28 MED ORDER — PIFLIFOLASTAT F 18 (PYLARIFY) INJECTION
9.0000 | Freq: Once | INTRAVENOUS | Status: AC
  Administered 2021-10-28: 10.01 via INTRAVENOUS

## 2021-11-07 ENCOUNTER — Encounter: Payer: Self-pay | Admitting: Certified Registered Nurse Anesthetist

## 2021-11-08 ENCOUNTER — Encounter: Payer: Self-pay | Admitting: Internal Medicine

## 2021-11-09 NOTE — Progress Notes (Signed)
Histology and Location of Primary Cancer:  Prostate cancer (biochemically recurrent)  Sites of Visceral and Bony Metastatic Disease: L3  Location(s) of Symptomatic Metastases: L3  10/28/2021 Dr. Alinda Money NM PET (PSMA) Skull To Mid Thigh CLINICAL DATA:  Prostate carcinoma with biochemical recurrence. Prostatectomy 2007. Radiation therapy 2011. Elevated PSA equal 26.8  FINDINGS: NECK No radiotracer activity in neck lymph nodes.   Incidental CT finding: None.   CHEST No radiotracer avid mediastinal supraclavicular nodes. No suspicious pulmonary nodules.   Two focus of radiotracer activity in RIGHT axilla with SUV max equal 3.7 on image 84 and 2.3 on image 91. No lesions measurable in the axillary fat corresponding this activity.   Incidental CT finding: None.   ABDOMEN/PELVIS   Prostate: No focal activity in prostatectomy bed.   Lymph nodes: There is moderate radiotracer activity along the LEFT external iliac artery with SUV max equal 6.9 on image 197. There is thin rim of tissue in the fat at this level (image 197 CT series 4). This rim of tissue is similar to CT 08/10/2020.   Similar focus of poorly defined tissue along the LEFT external iliac artery with SUV max equal 4.3 on image 190.   More defined nodal tissue in the retroperitoneum LEFT of the aorta just above the bifurcation measures 7 mm (image 168/4) with SUV max equal 12.4 (image 169 fused data set). This tissue is also similar to comparison CT 08/10/2020.   Liver: No evidence of liver metastasis.   Incidental CT finding: Large simple fluid attenuation cyst of the RIGHT kidney.   SKELETON Single focus of the radiotracer activity in the posterior aspect of the L3 vertebral body with SUV max equal 7.1 (image 159). No clear lesion identified posterior aspect of this vertebral body.   IMPRESSION: 1. No evidence of local recurrence in the prostatectomy bed. 2. Scant potential nodal tissue along the LEFT  external iliac artery and distal aorta with significant radiotracer activity. Findings concerning for very low volume metastatic adenopathy. 3. Single focus of radiotracer activity within the posterior aspect the L3 vertebral body is concerning for oligometastatic metastatic skeletal metastasis. 4. Activity in the RIGHT axilla is indeterminate.   08/10/2020 Dr. Alinda Money NM Bone Scan Whole Body CLINICAL DATA:  81 year old with prostate cancer.   FINDINGS: Again noted is increased uptake in the knees and ankles and this is suggestive for degenerative changes. No suspicious uptake in the ribs, axial skeleton or appendicular skeleton. Expected uptake in the urinary bladder.   IMPRESSION: No evidence for metastatic bone disease.  03/04/2021 Dr. Dina Rich MR Brain without Contrast CLINICAL DATA:  Neuro deficit, acute, stroke suspected   FINDINGS: Brain: No acute infarction, hemorrhage, hydrocephalus, extra-axial collection or mass lesion. Mild to moderate scattered T2/FLAIR hyperintensities in the white matter, nonspecific but compatible with chronic microvascular disease. Small remote lacunar infarcts in the right corona radiata and right basal ganglia.   Vascular: Major arterial flow voids are maintained at the skull base.   Skull and upper cervical spine: Normal marrow signal.   Sinuses/Orbits: Mild paranasal sinus mucosal thickening. Unremarkable orbits.   Other: No sizable mastoid effusions   IMPRESSION: 1. No evidence of acute intracranial abnormality. 2. Small remote lacunar infarcts and mild-to-moderate chronic microvascular disease.   Past/Anticipated chemotherapy by medical oncology, if any:  NA  Pain on a scale of 0-10 is:  0/10  If Spine Met(s), symptoms, if any, include: Bowel/Bladder retention or incontinence (please describe):  No, colonscopy scheduled on 11/15/2021 Numbness or weakness in  extremities (please describe): No Current Decadron regimen, if  applicable: No  Ambulatory status? Walker? Wheelchair?:  Independently ambulating.  SAFETY ISSUES: Prior radiation?  Yes, Prostate 2015. Pacemaker/ICD? No Possible current pregnancy?  Male Is the patient on methotrexate? No  Current Complaints / other details:  No

## 2021-11-11 ENCOUNTER — Ambulatory Visit
Admission: RE | Admit: 2021-11-11 | Discharge: 2021-11-11 | Disposition: A | Discharge status: Home / Self Care | Payer: Medicare Other | Source: Ambulatory Visit | Attending: Radiation Oncology | Admitting: Radiation Oncology

## 2021-11-11 ENCOUNTER — Other Ambulatory Visit: Payer: Self-pay

## 2021-11-11 DIAGNOSIS — I2510 Atherosclerotic heart disease of native coronary artery without angina pectoris: Secondary | ICD-10-CM | POA: Diagnosis not present

## 2021-11-11 DIAGNOSIS — Z87891 Personal history of nicotine dependence: Secondary | ICD-10-CM | POA: Insufficient documentation

## 2021-11-11 DIAGNOSIS — E559 Vitamin D deficiency, unspecified: Secondary | ICD-10-CM | POA: Insufficient documentation

## 2021-11-11 DIAGNOSIS — E785 Hyperlipidemia, unspecified: Secondary | ICD-10-CM | POA: Diagnosis not present

## 2021-11-11 DIAGNOSIS — Z79899 Other long term (current) drug therapy: Secondary | ICD-10-CM | POA: Diagnosis not present

## 2021-11-11 DIAGNOSIS — Z191 Hormone sensitive malignancy status: Secondary | ICD-10-CM | POA: Diagnosis not present

## 2021-11-11 DIAGNOSIS — N183 Chronic kidney disease, stage 3 unspecified: Secondary | ICD-10-CM | POA: Diagnosis not present

## 2021-11-11 DIAGNOSIS — M109 Gout, unspecified: Secondary | ICD-10-CM | POA: Diagnosis not present

## 2021-11-11 DIAGNOSIS — I10 Essential (primary) hypertension: Secondary | ICD-10-CM | POA: Insufficient documentation

## 2021-11-11 DIAGNOSIS — Z87442 Personal history of urinary calculi: Secondary | ICD-10-CM | POA: Insufficient documentation

## 2021-11-11 DIAGNOSIS — K219 Gastro-esophageal reflux disease without esophagitis: Secondary | ICD-10-CM | POA: Insufficient documentation

## 2021-11-11 DIAGNOSIS — Z86010 Personal history of colonic polyps: Secondary | ICD-10-CM | POA: Diagnosis not present

## 2021-11-11 DIAGNOSIS — C7951 Secondary malignant neoplasm of bone: Secondary | ICD-10-CM | POA: Insufficient documentation

## 2021-11-11 DIAGNOSIS — Z7982 Long term (current) use of aspirin: Secondary | ICD-10-CM | POA: Insufficient documentation

## 2021-11-11 DIAGNOSIS — E119 Type 2 diabetes mellitus without complications: Secondary | ICD-10-CM | POA: Diagnosis not present

## 2021-11-11 DIAGNOSIS — C61 Malignant neoplasm of prostate: Secondary | ICD-10-CM | POA: Diagnosis present

## 2021-11-11 DIAGNOSIS — Z8673 Personal history of transient ischemic attack (TIA), and cerebral infarction without residual deficits: Secondary | ICD-10-CM | POA: Diagnosis not present

## 2021-11-11 NOTE — Progress Notes (Signed)
Radiation Oncology         [phone] ________________________________  Initial Outpatient Consultation  Name: Rodney [name] MRN: [id number]  Date: 11/11/2021  DOB: 08-14-1940  [id number] Everardo Beals, MD  Rodney Bring, MD   REFERRING PHYSICIAN: [name], MD  DIAGNOSIS: 81 y.o. 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    ICD-10-CM   1. Prostate cancer ([place])  C61     2. Malignant neoplasm of prostate ([place])  C61       HISTORY OF PRESENT ILLNESS: RAYMON SCHLARB is a 81 y.o. 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  He developed biochemical failure with PSA up to 5.31 in May 2019, when CT and bone scan showed no metastases.  4/21  9.11 7/21  7.09 12/21  11.1 3/22  13.5 6/22  17.4  In July 2022, when his PSA reached 17.4, and again CT and bone scan showed no metastases.  2/23  19.6 5/22   21.7 9/23  16.9  So, PSMA PET was performed on 10/28/21 revealing oligometastatic disease as follows:  There is moderate radiotracer activity along the LEFT external iliac artery with SUV max equal 6.9 on image 197. There is thin rim of tissue in the fat at this level (image 197 CT series 4). This rim of tissue is similar to CT 08/10/2020.     Similar focus of poorly defined tissue along the LEFT external iliac artery with SUV max equal 4.3 on image 190.   More defined nodal tissue in the retroperitoneum LEFT of the aorta just above the bifurcation measures 7 mm (image 168/4) with SUV max equal 12.4 (image 169 fused data set). This tissue is also similar to comparison CT 08/10/2020.      Single focus of the radiotracer activity in the posterior aspect of the L3 vertebral body with SUV max equal 7.1 (image 159). No clear lesion identified posterior aspect of this vertebral body.     The patient reviewed the biopsy results with his urologist and he has kindly been referred today for discussion of potential radiation treatment options.   PREVIOUS RADIATION THERAPY: Yes as above  PAST MEDICAL HISTORY:  Past Medical History:  Diagnosis Date   Bladder calculus    CAD (coronary artery disease)    non obs    CKD (chronic kidney disease), stage III ([place])    " i have 35-40% of my kidney function"; mgd by pcp    COLONIC POLYPS, HX OF 12/02/2008   DIABETES MELLITUS, TYPE II 07/19/2007   Dyspnea    reports sob over the last 2 years , underwent cath  in feb 2020 , reports cath was clean , referred to pulmonologist that stopped his Actos and encouraged weight loss  ; reports today minimum improvement in breathing , appt with pulm in June 2020   Erectile dysfunction    GERD (gastroesophageal reflux disease)    Glaucoma    GOUT 07/19/2007   History of kidney stones    HYPERLIPIDEMIA 12/02/2008   HYPERTENSION 07/19/2007   Kidney stones    Prostate cancer ([place])    Reiter syndrome    incomplete   TIA (transient  ischemic attack)    Tubular adenoma of colon    Vitamin D deficiency       PAST SURGICAL HISTORY: Past Surgical History:  Procedure Laterality Date   bladder neck dilation     CATARACT EXTRACTION     both eyes- 2015   COLONOSCOPY     CYSTOSCOPY N/A 06/11/2018   Procedure: CYSTOSCOPY WITH REMOVAL OF BLADDER CALCULUS;  Surgeon: Rodney Bring, MD;  Location: [facility];  Service: Urology;  Laterality: N/A;   ELBOW SURGERY     bone spur - right   LEFT HEART CATH AND CORONARY ANGIOGRAPHY N/A 03/19/2018   Procedure: LEFT HEART CATH AND CORONARY ANGIOGRAPHY;  Surgeon: Belva Crome, MD;  Location: [facility];  Service: Cardiovascular;  Laterality: N/A;   ORIF  ANKLE FRACTURE Left 05/17/2015   Procedure: OPEN REDUCTION INTERNAL FIXATION (ORIF) ANKLE FRACTURE;  Surgeon: Meredith Pel, MD;  Location: [facility];  Service: Orthopedics;  Laterality: Left;   PROSTATECTOMY     URINARY SPHINCTER IMPLANT N/A 05/21/2019   Procedure: IMPLANTATION OF ARTIFICIAL URINARY SPHINCTER CYSTOSCOPY;  Surgeon: Bjorn Loser, MD;  Location: [facility];  Service: Urology;  Laterality: N/A;   VASECTOMY      FAMILY HISTORY:  Family History  Problem Relation Age of Onset   Cancer Sister        metastatic colon ca   Colon cancer Neg Hx    Esophageal cancer Neg Hx    Rectal cancer Neg Hx    Stomach cancer Neg Hx    Lung disease Neg Hx    Colon polyps Neg Hx    Pancreatic cancer Neg Hx     SOCIAL HISTORY:  Social History   Socioeconomic History   Marital status: Married    Spouse name: Not on file   Number of children: Not on file   Years of education: Not on file   Highest education level: Not on file  Occupational History   Not on file  Tobacco Use   Smoking status: Former    Packs/day: 1.00    Years: 30.00    Total pack years: 30.00    Types: Cigarettes    Quit date: 01/24/1978    Years since quitting: 43.8   Smokeless tobacco: Never  Vaping Use   Vaping Use: Never used  Substance and Sexual Activity   Alcohol use: Not Currently    Comment: occasionally -socially   Drug use: No   Sexual activity: Not on file    Comment: 2 childern  Other Topics Concern   Not on file  Social History Narrative   Not on file   Social Determinants of Health   Financial Resource Strain: Not on file  Food Insecurity: Not on file  Transportation Needs: Not on file  Physical Activity: Not on file  Stress: Not on file  Social Connections: Not on file  Intimate Partner Violence: Not on file    ALLERGIES: Ace inhibitors  MEDICATIONS:  Current Outpatient Medications  Medication Sig Dispense Refill   amLODipine (NORVASC) 5 MG tablet Take 1 tablet by mouth twice  daily 180 tablet 0   aspirin 81 MG chewable tablet Chew 1 tablet (81 mg total) by mouth daily. 30 tablet 1   atorvastatin (LIPITOR) 20 MG tablet TAKE 1 TABLET BY MOUTH AT BEDTIME . APPOINTMENT REQUIRED FOR FUTURE REFILLS 90 tablet 1   dorzolamide-timolol (COSOPT) 22.3-6.8 MG/ML ophthalmic solution Place 1 drop into both eyes every morning.     glipiZIDE (GLUCOTROL)  5 MG tablet TAKE 1 TABLET BY MOUTH ONCE DAILY BEFORE BREAKFAST 90 tablet 0   glucose blood (ACCU-CHEK AVIVA) test strip 1 each by Other route 2 (two) times daily. 100 each 3   losartan (COZAAR) 50 MG tablet TAKE 1 TABLET BY MOUTH AT BEDTIME 90 tablet 1   No current facility-administered medications for this visit.      REVIEW OF SYSTEMS:  On review of systems, the patient reports that he is doing well overall. He denies any chest pain, shortness of breath, cough, fevers, chills, night sweats, unintended weight changes. He denies any bowel disturbances, and denies abdominal pain, nausea or vomiting. He denies any new musculoskeletal or joint aches or pains.     PHYSICAL EXAM:  Wt Readings from Last 3 Encounters:  09/01/21 223 lb 4 oz (101.3 kg)  08/30/21 224 lb 14.4 oz (102 kg)  05/17/21 224 lb 4.8 oz (101.7 kg)   Temp Readings from Last 3 Encounters:  08/30/21 97.8 F (36.6 C) (Oral)  05/17/21 97.6 F (36.4 C) (Oral)  03/11/21 97.9 F (36.6 C) (Oral)   BP Readings from Last 3 Encounters:  09/01/21 96/60  08/30/21 130/70  05/17/21 130/80   Pulse Readings from Last 3 Encounters:  09/01/21 100  08/30/21 82  05/17/21 76    /10  In general this is a well appearing gentleman in no acute distress. He's alert and oriented x4 and appropriate throughout the examination. Cardiopulmonary assessment is negative for acute distress, and he exhibits normal effort.    KPS = 100  100 - Normal; no complaints; no evidence of disease. 90   - Able to carry on normal activity; minor signs or symptoms of disease. 80   - Normal  activity with effort; some signs or symptoms of disease. 42   - Cares for self; unable to carry on normal activity or to do active work. 60   - Requires occasional assistance, but is able to care for most of his personal needs. 50   - Requires considerable assistance and frequent medical care. 18   - Disabled; requires special care and assistance. 75   - Severely disabled; hospital admission is indicated although death not imminent. 28   - Very sick; hospital admission necessary; active supportive treatment necessary. 10   - Moribund; fatal processes progressing rapidly. 0     - Dead  Karnofsky DA, Abelmann [place], Craver LS and Burchenal [facility] [phone]) The use of the nitrogen mustards in the palliative treatment of carcinoma: with particular reference to bronchogenic carcinoma Cancer 1 [phone]  LABORATORY DATA:  Lab Results  Component Value Date   WBC 7.2 03/04/2021   HGB 13.3 03/04/2021   HCT 40.1 03/04/2021   MCV 99.8 03/04/2021   PLT 226 03/04/2021   Lab Results  Component Value Date   NA 138 03/04/2021   K 4.2 03/04/2021   CL 105 03/04/2021   CO2 22 03/04/2021   Lab Results  Component Value Date   ALT 25 03/04/2021   AST 21 03/04/2021   ALKPHOS 85 03/04/2021   BILITOT 1.2 03/04/2021     RADIOGRAPHY: NM PET (PSMA) SKULL TO MID THIGH  Result Date: 10/31/2021 CLINICAL DATA:  Prostate carcinoma with biochemical recurrence. Prostatectomy 2007. Radiation therapy 2011. Elevated PSA equal 26.8 EXAM: NUCLEAR MEDICINE PET SKULL BASE TO THIGH TECHNIQUE: 10.0 mCi F18 Piflufolastat (Pylarify) was injected intravenously. Full-ring PET imaging was performed from the skull base to thigh after the radiotracer. CT data was obtained and used  for attenuation correction and anatomic localization. COMPARISON:  Bone scan 08/10/2020 CT 08/10/2020 FINDINGS: NECK No radiotracer activity in neck lymph nodes. Incidental CT finding: None. CHEST No radiotracer avid mediastinal supraclavicular nodes. No suspicious  pulmonary nodules. Two focus of radiotracer activity in RIGHT axilla with SUV max equal 3.7 on image 84 and 2.3 on image 91. No lesions measurable in the axillary fat corresponding this activity. Incidental CT finding: None. ABDOMEN/PELVIS Prostate: No focal activity in prostatectomy bed. Lymph nodes: There is moderate radiotracer activity along the LEFT external iliac artery with SUV max equal 6.9 on image 197. There is thin rim of tissue in the fat at this level (image 197 CT series 4). This rim of tissue is similar to CT 08/10/2020. Similar focus of poorly defined tissue along the LEFT external iliac artery with SUV max equal 4.3 on image 190. More defined nodal tissue in the retroperitoneum LEFT of the aorta just above the bifurcation measures 7 mm (image 168/4) with SUV max equal 12.4 (image 169 fused data set). This tissue is also similar to comparison CT 08/10/2020. Liver: No evidence of liver metastasis. Incidental CT finding: Large simple fluid attenuation cyst of the RIGHT kidney. SKELETON Single focus of the radiotracer activity in the posterior aspect of the L3 vertebral body with SUV max equal 7.1 (image 159). No clear lesion identified posterior aspect of this vertebral body. IMPRESSION: 1. No evidence of local recurrence in the prostatectomy bed. 2. Scant potential nodal tissue along the LEFT external iliac artery and distal aorta with significant radiotracer activity. Findings concerning for very low volume metastatic adenopathy. 3. Single focus of radiotracer activity within the posterior aspect the L3 vertebral body is concerning for oligometastatic metastatic skeletal metastasis. 4. Activity in the RIGHT axilla is indeterminate. Electronically Signed   By: Suzy Bouchard M.D.   On: 10/31/2021 14:13      IMPRESSION/PLAN: 1. 81 y.o. 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   Today,  I talked to the patient and family about the findings and work-up thus far.  We discussed the natural history of oligorecurrent prostate cancer and general treatment, highlighting the role of radiotherapy in the management.  We discussed the available radiation techniques, and focused on the details of logistics and delivery.  We reviewed the anticipated acute and late sequelae associated with radiation in this setting.  The patient was encouraged to ask questions that I answered to the best of my ability.  I filled out a patient counseling form during our discussion including treatment diagrams.  We retained a copy for our records.  The patient would like to proceed with radiation and will be scheduled for CT simulation.  At the conclusion of our conversation, the patient is interested in moving forward with SBRT treatment to his oligometastatic disease.  His L3 spinal met is amenable to 5-fraction SBRT, while his adenopathy would be treated using Ultrahypofractionated Radiotherapy in order to treat the three PET positive nodes to 50 Gy in 10 fractions while treating the involved nodal echelons comprehensively to 30 Gy in 10 fractions.  He'll return for CT simulation on Tuesday 10/21 at 10:30 am.  We personally spent 60 minutes in this encounter including chart review, reviewing radiological studies, meeting face-to-face with the patient, entering orders and completing documentation.      Tyler Pita, MD  [facility]  Radiation Oncology Direct Dial: [phone]  Fax: [phone] [email or web address]  Skype  LinkedIn

## 2021-11-15 ENCOUNTER — Encounter: Payer: Self-pay | Admitting: Internal Medicine

## 2021-11-15 ENCOUNTER — Ambulatory Visit (AMBULATORY_SURGERY_CENTER): Payer: Medicare Other | Admitting: Internal Medicine

## 2021-11-15 ENCOUNTER — Encounter: Payer: Medicare Other | Admitting: Family Medicine

## 2021-11-15 VITALS — BP 131/81 | HR 70 | Temp 97.2°F | Resp 16 | Ht 68.0 in | Wt 223.0 lb

## 2021-11-15 DIAGNOSIS — D124 Benign neoplasm of descending colon: Secondary | ICD-10-CM

## 2021-11-15 DIAGNOSIS — D123 Benign neoplasm of transverse colon: Secondary | ICD-10-CM

## 2021-11-15 DIAGNOSIS — Z86010 Personal history of colonic polyps: Secondary | ICD-10-CM

## 2021-11-15 DIAGNOSIS — Z79899 Other long term (current) drug therapy: Secondary | ICD-10-CM | POA: Diagnosis not present

## 2021-11-15 DIAGNOSIS — Z09 Encounter for follow-up examination after completed treatment for conditions other than malignant neoplasm: Secondary | ICD-10-CM

## 2021-11-15 DIAGNOSIS — Z87891 Personal history of nicotine dependence: Secondary | ICD-10-CM | POA: Diagnosis not present

## 2021-11-15 DIAGNOSIS — D125 Benign neoplasm of sigmoid colon: Secondary | ICD-10-CM | POA: Diagnosis not present

## 2021-11-15 DIAGNOSIS — I10 Essential (primary) hypertension: Secondary | ICD-10-CM | POA: Diagnosis not present

## 2021-11-15 DIAGNOSIS — I2510 Atherosclerotic heart disease of native coronary artery without angina pectoris: Secondary | ICD-10-CM | POA: Diagnosis not present

## 2021-11-15 DIAGNOSIS — Z880 Allergy status to penicillin: Secondary | ICD-10-CM | POA: Diagnosis not present

## 2021-11-15 DIAGNOSIS — E119 Type 2 diabetes mellitus without complications: Secondary | ICD-10-CM | POA: Diagnosis not present

## 2021-11-15 HISTORY — PX: COLONOSCOPY: SHX174

## 2021-11-15 MED ORDER — SODIUM CHLORIDE 0.9 % IV SOLN: 500.0000 mL | Freq: Once | INTRAVENOUS | Status: DC

## 2021-11-15 NOTE — Progress Notes (Signed)
GASTROENTEROLOGY PROCEDURE H&P NOTE   Primary Care Physician: Isaac Bliss, Rayford Halsted, MD    Reason for Procedure:  History of multiple adenomatous colon polyps  Plan:    Colonoscopy  Patient is appropriate for endoscopic procedure(s) in the ambulatory ([place]) setting.  The nature of the procedure, as well as the risks, benefits, and alternatives were carefully and thoroughly reviewed with the patient. Ample time for discussion and questions allowed. The patient understood, was satisfied, and agreed to proceed.     HPI: Rodney [name] is a 81 y.o. male who presents for surveillance colonoscopy.  Medical history as below.  Tolerated the prep.  No recent chest pain or shortness of breath.  No abdominal pain today.  Past Medical History:  Diagnosis Date   Bladder calculus    CAD (coronary artery disease)    non obs    CKD (chronic kidney disease), stage III ([place])    " i have 35-40% of my kidney function"; mgd by pcp    COLONIC POLYPS, HX OF 12/02/2008   DIABETES MELLITUS, TYPE II 07/19/2007   Dyspnea    reports sob over the last 2 years , underwent cath  in feb 2020 , reports cath was clean , referred to pulmonologist that stopped his Actos and encouraged weight loss  ; reports today minimum improvement in breathing , appt with pulm in June 2020   Erectile dysfunction    GERD (gastroesophageal reflux disease)    Glaucoma    GOUT 07/19/2007   History of kidney stones    HYPERLIPIDEMIA 12/02/2008   HYPERTENSION 07/19/2007   Kidney stones    Prostate cancer ([place]) 2007   Reiter syndrome    incomplete   TIA (transient ischemic attack)    Tubular adenoma of colon    Vitamin D deficiency     Past Surgical History:  Procedure Laterality Date   bladder neck dilation     CATARACT EXTRACTION     both eyes- 2015   COLONOSCOPY  11/15/2021   12-11-14-polyps   CYSTOSCOPY N/A 06/11/2018   Procedure: CYSTOSCOPY WITH REMOVAL OF BLADDER CALCULUS;  Surgeon: [name],  MD;  Location: [facility];  Service: Urology;  Laterality: N/A;   ELBOW SURGERY     bone spur - right   LEFT HEART CATH AND CORONARY ANGIOGRAPHY N/A 03/19/2018   Procedure: LEFT HEART CATH AND CORONARY ANGIOGRAPHY;  Surgeon: Belva Crome, MD;  Location: [facility];  Service: Cardiovascular;  Laterality: N/A;   ORIF ANKLE FRACTURE Left 05/17/2015   Procedure: OPEN REDUCTION INTERNAL FIXATION (ORIF) ANKLE FRACTURE;  Surgeon: Meredith Pel, MD;  [facility];  Service: Orthopedics;  Laterality: Left;   PROSTATECTOMY  2007   URINARY SPHINCTER IMPLANT N/A 05/21/2019   Procedure: IMPLANTATION OF ARTIFICIAL URINARY SPHINCTER CYSTOSCOPY;  Surgeon: Bjorn Loser, MD;  [facility];  Service: Urology;  Laterality: N/A;   VASECTOMY      Prior to Admission medications   Medication Sig Start Date End Date Taking? Authorizing Provider  amLODipine (NORVASC) 5 MG tablet Take 1 tablet by mouth twice daily 10/05/21  Yes Isaac Bliss, Rayford Halsted, MD  atorvastatin (LIPITOR) 20 MG tablet TAKE 1 TABLET BY MOUTH AT BEDTIME . APPOINTMENT REQUIRED FOR FUTURE REFILLS 09/01/21  Yes Isaac Bliss, Rayford Halsted, MD  dorzolamide-timolol (COSOPT) 22.3-6.8 MG/ML ophthalmic solution Place 1 drop into both eyes every morning. 09/20/13  Yes [provider]  glipiZIDE (GLUCOTROL) 5 MG tablet TAKE 1 TABLET BY MOUTH  ONCE DAILY BEFORE BREAKFAST 08/25/21  Yes Isaac Bliss, Rayford Halsted, MD  losartan (COZAAR) 50 MG tablet TAKE 1 TABLET BY MOUTH AT BEDTIME 09/01/21  Yes Isaac Bliss, Rayford Halsted, MD  aspirin 81 MG chewable tablet Chew 1 tablet (81 mg total) by mouth daily. 03/05/21   Hongalgi, Lenis Dickinson, MD  glucose blood (ACCU-CHEK AVIVA) test strip 1 each by Other route 2 (two) times daily. 06/24/11   Marletta Lor, MD    Current Outpatient Medications  Medication Sig Dispense Refill   amLODipine (NORVASC) 5 MG tablet Take 1 tablet by mouth twice daily 180 tablet 0   atorvastatin (LIPITOR) 20 MG  tablet TAKE 1 TABLET BY MOUTH AT BEDTIME . APPOINTMENT REQUIRED FOR FUTURE REFILLS 90 tablet 1   dorzolamide-timolol (COSOPT) 22.3-6.8 MG/ML ophthalmic solution Place 1 drop into both eyes every morning.     glipiZIDE (GLUCOTROL) 5 MG tablet TAKE 1 TABLET BY MOUTH ONCE DAILY BEFORE BREAKFAST 90 tablet 0   losartan (COZAAR) 50 MG tablet TAKE 1 TABLET BY MOUTH AT BEDTIME 90 tablet 1   aspirin 81 MG chewable tablet Chew 1 tablet (81 mg total) by mouth daily. 30 tablet 1   glucose blood (ACCU-CHEK AVIVA) test strip 1 each by Other route 2 (two) times daily. 100 each 3   Current Facility-Administered Medications  Medication Dose Route Frequency Provider Last Rate Last Admin   0.9 %  sodium chloride infusion  500 mL Intravenous Once Kenlie Seki, Lajuan Lines, MD        Allergies as of 11/15/2021 - Review Complete 11/15/2021  Allergen Reaction Noted   Ace inhibitors Swelling 07/19/2007    Family History  Problem Relation Age of Onset   Cancer Sister        metastatic colon ca   Colon cancer Neg Hx    Esophageal cancer Neg Hx    Rectal cancer Neg Hx    Stomach cancer Neg Hx    Lung disease Neg Hx    Colon polyps Neg Hx    Pancreatic cancer Neg Hx     Social History   Socioeconomic History   Marital status: Married    Spouse name: Not on file   Number of children: Not on file   Years of education: Not on file   Highest education level: Not on file  Occupational History   Not on file  Tobacco Use   Smoking status: Former    Packs/day: 1.00    Years: 30.00    Total pack years: 30.00    Types: Cigarettes    Quit date: 01/24/1978    Years since quitting: 43.8   Smokeless tobacco: Never  Vaping Use   Vaping Use: Never used  Substance and Sexual Activity   Alcohol use: Not Currently    Comment: occasionally -socially   Drug use: No   Sexual activity: Not Currently    Comment: 2 childern  Other Topics Concern   Not on file  Social History Narrative   Not on file   Social Determinants  of Health   Financial Resource Strain: Not on file  Food Insecurity: Not on file  Transportation Needs: Not on file  Physical Activity: Not on file  Stress: Not on file  Social Connections: Not on file  Intimate Partner Violence: Not on file    Physical Exam: Vital signs in last 24 hours: '@BP'$  122/66   Pulse 82   Temp (!) 97.2 F (36.2 C) (Temporal)   Ht '5\' 8"'$  (1.727  m)   Wt 223 lb (101.2 kg)   SpO2 99%   BMI 33.91 kg/m  GEN: NAD EYE: Sclerae anicteric ENT: MMM CV: Non-tachycardic Pulm: CTA b/l GI: Soft, NT/ND NEURO:  Alert & Oriented x 3   Zenovia Jarred, MD [facility]  11/15/2021 8:33 AM

## 2021-11-15 NOTE — Op Note (Signed)
[place] Patient Name: Rodney [name] Procedure Date: 11/15/2021 8:39 AM MRN: [id number] Endoscopist: Jerene Bears , MD Age: 81 Referring MD:  Date of Birth: Jun 15, 1940 Gender: Male Account #: [id number] Procedure:                Colonoscopy Indications:              High risk colon cancer surveillance: Personal                            history of multiple adenomas, Last colonoscopy:                            November 2016 (8 adenomas removed) Medicines:                Monitored Anesthesia Care Procedure:                Pre-Anesthesia Assessment:                           - Prior to the procedure, a History and Physical                            was performed, and patient medications and                            allergies were reviewed. The patient's tolerance of                            previous anesthesia was also reviewed. The risks                            and benefits of the procedure and the sedation                            options and risks were discussed with the patient.                            All questions were answered, and informed consent                            was obtained. Prior Anticoagulants: The patient has                            taken no previous anticoagulant or antiplatelet                            agents. ASA Grade Assessment: III - A patient with                            severe systemic disease. After reviewing the risks                            and benefits, the patient was deemed in  satisfactory condition to undergo the procedure.                           After obtaining informed consent, the colonoscope                            was passed under direct vision. Throughout the                            procedure, the patient's blood pressure, pulse, and                            oxygen saturations were monitored continuously. The                            CF [id number] #[id number] was  introduced through the anus                            and advanced to the cecum, identified by                            appendiceal orifice and ileocecal valve. The                            colonoscopy was performed without difficulty. The                            patient tolerated the procedure well. The quality                            of the bowel preparation was good. The ileocecal                            valve, appendiceal orifice, and rectum were                            photographed. Scope In: 8:46:08 AM Scope Out: 9:10:27 AM Scope Withdrawal Time: 0 hours 18 minutes 44 seconds  Total Procedure Duration: 0 hours 24 minutes 19 seconds  Findings:                 The digital rectal exam was normal.                           Two sessile polyps were found in the transverse                            colon. The polyps were 4 to 5 mm in size. These                            polyps were removed with a cold snare. Resection                            and retrieval were complete.  A 4 mm polyp was found in the descending colon. The                            polyp was sessile. The polyp was removed with a                            cold snare. Resection and retrieval were complete.                           A 8 mm polyp was found in the proximal sigmoid                            colon. The polyp was sessile. The polyp was removed                            with a cold snare. Resection and retrieval were                            complete.                           A 10 mm polyp was found in the distal sigmoid                            colon. The polyp was semi-pedunculated. The polyp                            was removed with a hot snare. Resection and                            retrieval were complete.                           Internal hemorrhoids were found during                            retroflexion. The hemorrhoids were  medium-sized. Complications:            No immediate complications. Estimated Blood Loss:     Estimated blood loss was minimal. Impression:               - Two 4 to 5 mm polyps in the transverse colon,                            removed with a cold snare. Resected and retrieved.                           - One 4 mm polyp in the descending colon, removed                            with a cold snare. Resected and retrieved.                           -  One 8 mm polyp in the proximal sigmoid colon,                            removed with a cold snare. Resected and retrieved.                           - One 10 mm polyp in the distal sigmoid colon,                            removed with a hot snare. Resected and retrieved.                           - Internal hemorrhoids. Recommendation:           - Patient has a contact number available for                            emergencies. The signs and symptoms of potential                            delayed complications were discussed with the                            patient. Return to normal activities tomorrow.                            Written discharge instructions were provided to the                            patient.                           - Resume previous diet.                           - Continue present medications.                           - Await pathology results.                           - No repeat colonoscopy due to age. Jerene Bears, MD 11/15/2021 9:13:50 AM This report has been signed electronically.

## 2021-11-15 NOTE — Progress Notes (Signed)
Called to room to assist during endoscopic procedure.  Patient ID and intended procedure confirmed with present staff. Received instructions for my participation in the procedure from the performing physician.  

## 2021-11-15 NOTE — Progress Notes (Signed)
Patient complaining of stomach discomfort, patient hasn't passed any gas. Patient's stomach is distended and firm. RN gave 0.25 mg of Levsin, patient stated that they were starting to feel some relief. Patient stated that they felt well enough and wanted to go home and didn't want to stay any longer. RN let patient know that they can take over the counter gas-x to help relieve more gas pain/pressure and to call us at the office or the on-call doctor if there is any issues/questions/concerns.

## 2021-11-15 NOTE — Progress Notes (Signed)
Pt's states no medical or surgical changes since previsit or office visit. 

## 2021-11-15 NOTE — Patient Instructions (Signed)
Handouts on polyps and hemorrhoids given to patient. Await pathology results. Resume previous diet and continue present medications. No repeat colonoscopy due to age.   YOU HAD AN ENDOSCOPIC PROCEDURE TODAY AT [facility]:   Refer to the procedure report that was given to you for any specific questions about what was found during the examination.  If the procedure report does not answer your questions, please call your gastroenterologist to clarify.  If you requested that your care partner not be given the details of your procedure findings, then the procedure report has been included in a sealed envelope for you to review at your convenience later.  YOU SHOULD EXPECT: Some feelings of bloating in the abdomen. Passage of more gas than usual.  Walking can help get rid of the air that was put into your GI tract during the procedure and reduce the bloating. If you had a lower endoscopy (such as a colonoscopy or flexible sigmoidoscopy) you may notice spotting of blood in your stool or on the toilet paper. If you underwent a bowel prep for your procedure, you may not have a normal bowel movement for a few days.  Please Note:  You might notice some irritation and congestion in your nose or some drainage.  This is from the oxygen used during your procedure.  There is no need for concern and it should clear up in a day or so.  SYMPTOMS TO REPORT IMMEDIATELY:  Following lower endoscopy (colonoscopy or flexible sigmoidoscopy):  Excessive amounts of blood in the stool  Significant tenderness or worsening of abdominal pains  Swelling of the abdomen that is new, acute  Fever of 100F or higher  For urgent or emergent issues, a gastroenterologist can be reached at any hour by calling [phone]. Do not use MyChart messaging for urgent concerns.    DIET:  We do recommend a small meal at first, but then you may proceed to your regular diet.  Drink plenty of fluids but you should avoid  alcoholic beverages for 24 hours.  ACTIVITY:  You should plan to take it easy for the rest of today and you should NOT DRIVE or use heavy machinery until tomorrow (because of the sedation medicines used during the test).    FOLLOW UP: Our staff will call the number listed on your records the next business day following your procedure.  We will call around 7:15- 8:00 am to check on you and address any questions or concerns that you may have regarding the information given to you following your procedure. If we do not reach you, we will leave a message.     If any biopsies were taken you will be contacted by phone or by letter within the next 1-3 weeks.  Please call us at [phone] if you have not heard about the biopsies in 3 weeks.    SIGNATURES/CONFIDENTIALITY: You and/or your care partner have signed paperwork which will be entered into your electronic medical record.  These signatures attest to the fact that that the information above on your After Visit Summary has been reviewed and is understood.  Full responsibility of the confidentiality of this discharge information lies with you and/or your care-partner.

## 2021-11-15 NOTE — Progress Notes (Signed)
Report given to PACU, vss 

## 2021-11-15 NOTE — Progress Notes (Signed)
Pt vomited in hallway to PACU, no s/s of aspiration noted.  Alert and following commands.  Zofran 4 mg given. Vss MD made aware.

## 2021-11-16 ENCOUNTER — Ambulatory Visit
Admission: RE | Admit: 2021-11-16 | Discharge: 2021-11-16 | Disposition: A | Discharge status: Home / Self Care | Payer: Medicare Other | Source: Ambulatory Visit | Attending: Radiation Oncology | Admitting: Radiation Oncology

## 2021-11-16 ENCOUNTER — Other Ambulatory Visit: Payer: Self-pay

## 2021-11-16 ENCOUNTER — Telehealth: Payer: Self-pay

## 2021-11-16 DIAGNOSIS — Z191 Hormone sensitive malignancy status: Secondary | ICD-10-CM | POA: Diagnosis not present

## 2021-11-16 DIAGNOSIS — C7951 Secondary malignant neoplasm of bone: Secondary | ICD-10-CM | POA: Insufficient documentation

## 2021-11-16 DIAGNOSIS — C61 Malignant neoplasm of prostate: Secondary | ICD-10-CM | POA: Diagnosis present

## 2021-11-16 NOTE — Telephone Encounter (Signed)
  Follow up Call-     11/15/2021    8:08 AM  Call back number  Post procedure Call Back phone  # [phone]  Permission to leave phone message Yes     Patient questions:  Do you have a fever, pain , or abdominal swelling? No. Pain Score  0 *  Have you tolerated food without any problems? Yes.    Have you been able to return to your normal activities? Yes.    Do you have any questions about your discharge instructions: Diet   No. Medications  No. Follow up visit  No.  Do you have questions or concerns about your Care? No.  Actions: * If pain score is 4 or above: No action needed, pain <4.

## 2021-11-17 NOTE — Progress Notes (Signed)
  Radiation Oncology         [phone] ________________________________  Name: Rodney [name] MRN: [id number]  Date: 11/16/2021  DOB: Dec 04, 1940  ULTRAHYPOFRACTIONATED RADIOTHERAPY (UHRT) SIMULATION AND TREATMENT PLANNING NOTE    ICD-10-CM   1. Malignant neoplasm of prostate ([place])  C61       DIAGNOSIS:  81 y.o. 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  NARRATIVE:  The patient was brought to the [facility].  Identity was confirmed.  All relevant records and images related to the planned course of therapy were reviewed.  The patient freely provided informed written consent to proceed with treatment after reviewing the details related to the planned course of therapy. The consent form was witnessed and verified by the simulation staff.  Then, the patient was set-up in a stable reproducible  supine position for radiation therapy.  A BodyFix immobilization pillow was fabricated for reproducible positioning.  Surface markings were placed.  The CT images were loaded into the planning software.  The gross target volumes (GTV) and planning target volumes (PTV) were delinieated, and avoidance structures were contoured.  Treatment planning then occurred.  The radiation prescription was entered and confirmed.  A total of two complex treatment devices were fabricated in the form of the BodyFix immobilization pillow and a neck accuform cushion.  I have requested : 3D Simulation  I have requested a DVH of the following structures: targets and all normal structures near the target including bowel, left kidney, right kidney and skin as noted on the radiation plan to maintain doses in adherence with established limits  SPECIAL TREATMENT PROCEDURE:  The planned course of therapy using radiation constitutes a special treatment procedure. Special care is required in the management of  this patient for the following reasons. High dose per fraction requiring special monitoring for increased toxicities of treatment including daily imaging..  The special nature of the planned course of radiotherapy will require increased physician supervision and oversight to ensure patient's safety with optimal treatment outcomes.    This requires extended time and effort.    PLAN:  The patient will receive 50 Gy in 5 fractions to L3 and 50 Gy in 10 fractions to the lymphadenopathy.  ________________________________  Sheral Apley Tammi Klippel, M.D.

## 2021-11-18 ENCOUNTER — Encounter: Payer: Self-pay | Admitting: Family Medicine

## 2021-11-18 ENCOUNTER — Encounter: Payer: Self-pay | Admitting: Internal Medicine

## 2021-11-18 ENCOUNTER — Ambulatory Visit (INDEPENDENT_AMBULATORY_CARE_PROVIDER_SITE_OTHER): Payer: Medicare Other | Admitting: Family Medicine

## 2021-11-18 VITALS — BP 130/80 | HR 75 | Temp 97.9°F | Ht 68.0 in | Wt 223.2 lb

## 2021-11-18 DIAGNOSIS — C61 Malignant neoplasm of prostate: Secondary | ICD-10-CM | POA: Diagnosis not present

## 2021-11-18 DIAGNOSIS — N1832 Chronic kidney disease, stage 3b: Secondary | ICD-10-CM | POA: Diagnosis not present

## 2021-11-18 DIAGNOSIS — E785 Hyperlipidemia, unspecified: Secondary | ICD-10-CM

## 2021-11-18 DIAGNOSIS — E1122 Type 2 diabetes mellitus with diabetic chronic kidney disease: Secondary | ICD-10-CM

## 2021-11-18 DIAGNOSIS — I10 Essential (primary) hypertension: Secondary | ICD-10-CM | POA: Diagnosis not present

## 2021-11-18 DIAGNOSIS — C7951 Secondary malignant neoplasm of bone: Secondary | ICD-10-CM | POA: Diagnosis not present

## 2021-11-18 DIAGNOSIS — Z191 Hormone sensitive malignancy status: Secondary | ICD-10-CM | POA: Diagnosis not present

## 2021-11-18 LAB — CBC WITH DIFFERENTIAL/PLATELET
Basophils Absolute: 0 10*3/uL (ref 0.0–0.1)
Basophils Relative: 0.6 % (ref 0.0–3.0)
Eosinophils Absolute: 0.1 10*3/uL (ref 0.0–0.7)
Eosinophils Relative: 2 % (ref 0.0–5.0)
HCT: 38.5 % — ABNORMAL LOW (ref 39.0–52.0)
Hemoglobin: 13 g/dL (ref 13.0–17.0)
Lymphocytes Relative: 13.1 % (ref 12.0–46.0)
Lymphs Abs: 0.8 10*3/uL (ref 0.7–4.0)
MCHC: 33.8 g/dL (ref 30.0–36.0)
MCV: 97.9 fl (ref 78.0–100.0)
Monocytes Absolute: 0.6 10*3/uL (ref 0.1–1.0)
Monocytes Relative: 9.3 % (ref 3.0–12.0)
Neutro Abs: 4.5 10*3/uL (ref 1.4–7.7)
Neutrophils Relative %: 75 % (ref 43.0–77.0)
Platelets: 260 10*3/uL (ref 150.0–400.0)
RBC: 3.94 Mil/uL — ABNORMAL LOW (ref 4.22–5.81)
RDW: 13.7 % (ref 11.5–15.5)
WBC: 5.9 10*3/uL (ref 4.0–10.5)

## 2021-11-18 LAB — COMPREHENSIVE METABOLIC PANEL
ALT: 18 U/L (ref 0–53)
AST: 16 U/L (ref 0–37)
Albumin: 4.4 g/dL (ref 3.5–5.2)
Alkaline Phosphatase: 84 U/L (ref 39–117)
BUN: 21 mg/dL (ref 6–23)
CO2: 24 mEq/L (ref 19–32)
Calcium: 9.6 mg/dL (ref 8.4–10.5)
Chloride: 106 mEq/L (ref 96–112)
Creatinine, Ser: 1.57 mg/dL — ABNORMAL HIGH (ref 0.40–1.50)
GFR: 41.1 mL/min — ABNORMAL LOW (ref >60.00)
Glucose, Bld: 166 mg/dL — ABNORMAL HIGH (ref 70–99)
Potassium: 4.7 mEq/L (ref 3.5–5.1)
Sodium: 139 mEq/L (ref 135–145)
Total Bilirubin: 0.6 mg/dL (ref 0.2–1.2)
Total Protein: 7.2 g/dL (ref 6.0–8.3)

## 2021-11-18 LAB — LIPID PANEL
Cholesterol: 105 mg/dL (ref 0–200)
HDL: 27.9 mg/dL — ABNORMAL LOW (ref >39.00)
LDL Cholesterol: 46 mg/dL (ref 0–99)
NonHDL: 77.48
Total CHOL/HDL Ratio: 4
Triglycerides: 156 mg/dL — ABNORMAL HIGH (ref 0.0–149.0)
VLDL: 31.2 mg/dL (ref 0.0–40.0)

## 2021-11-18 LAB — TSH: TSH: 2.63 u[IU]/mL (ref 0.35–5.50)

## 2021-11-18 LAB — HEMOGLOBIN A1C: Hgb A1c MFr Bld: 7.3 % — ABNORMAL HIGH (ref 4.6–6.5)

## 2021-11-18 MED ORDER — LOSARTAN POTASSIUM 50 MG PO TABS: 50.0000 mg | ORAL_TABLET | Freq: Every day | ORAL | 3 refills | Status: DC

## 2021-11-18 MED ORDER — ATORVASTATIN CALCIUM 20 MG PO TABS: ORAL_TABLET | ORAL | 3 refills | Status: DC

## 2021-11-18 MED ORDER — AMLODIPINE BESYLATE 5 MG PO TABS: 5.0000 mg | ORAL_TABLET | Freq: Two times a day (BID) | ORAL | 3 refills | Status: DC

## 2021-11-18 MED ORDER — GLIPIZIDE 5 MG PO TABS: 5.0000 mg | ORAL_TABLET | Freq: Every day | ORAL | 3 refills | Status: DC

## 2021-11-18 NOTE — Patient Instructions (Signed)
It was very nice to see you today!  Happy Anniversary   PLEASE NOTE:  If you had any lab tests please let us know if you have not heard back within a few days. You may see your results on MyChart before we have a chance to review them but we will give you a call once they are reviewed by [place]. If we ordered any referrals today, please let us know if you have not heard from their office within the next week.   Please try these tips to maintain a healthy lifestyle:  Eat most of your calories during the day when you are active. Eliminate processed foods including packaged sweets (pies, cakes, cookies), reduce intake of potatoes, white bread, white pasta, and white rice. Look for whole grain options, oat flour or almond flour.  Each meal should contain half fruits/vegetables, one quarter protein, and one quarter carbs (no bigger than a computer mouse).  Cut down on sweet beverages. This includes juice, soda, and sweet tea. Also watch fruit intake, though this is a healthier sweet option, it still contains natural sugar! Limit to 3 servings daily.  Drink at least 1 glass of water with each meal and aim for at least 8 glasses per day  Exercise at least 150 minutes every week.

## 2021-11-18 NOTE — Progress Notes (Signed)
Subjective:     Patient ID: Rodney [name], male    DOB: 31-Aug-1940, 81 y.o.   MRN: [id number]  Chief Complaint  Patient presents with   Establish Care    HPI [facility]  Prostate ca-has had prostatectomy and recurred so then radiation.  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  Seeing Dr. Epifanio Lesches tx to start on those areas.  HTN-Pt is on cozaar and amlodipine.  Bp's running  120-130/60-70.  No ha/dizziness/cp/palp/edema/cough  DM-not checking.  Doesn't feel low.on glipizide '5mg'$  daily HLD-on atorvastatin '20mg'$   Health Maintenance Due  Topic Date Due   Medicare Annual Wellness (AWV)  Never done   FOOT EXAM  01/09/2018   OPHTHALMOLOGY EXAM  10/14/2021    Past Medical History:  Diagnosis Date   Bladder calculus    CAD (coronary artery disease)    non obs    CKD (chronic kidney disease), stage III ([place])    " i have 35-40% of my kidney function"; mgd by pcp    COLONIC POLYPS, HX OF 12/02/2008   DIABETES MELLITUS, TYPE II 07/19/2007   Dyspnea    reports sob over the last 2 years , underwent cath  in feb 2020 , reports cath was clean , referred to pulmonologist that stopped his Actos and encouraged weight loss  ; reports today minimum improvement in breathing , appt with pulm in June 2020   Erectile dysfunction    GERD (gastroesophageal reflux disease)    Glaucoma    GOUT 07/19/2007   History of kidney stones    HYPERLIPIDEMIA 12/02/2008   HYPERTENSION 07/19/2007   Kidney stones    Prostate cancer ([place]) 2007   Reiter syndrome    incomplete   TIA (transient ischemic attack)    Tubular adenoma of colon    Vitamin D deficiency     Past Surgical History:  Procedure Laterality Date   bladder neck dilation     CATARACT EXTRACTION     both eyes- 2015   COLONOSCOPY  11/15/2021   12-11-14-polyps   CYSTOSCOPY N/A 06/11/2018   Procedure: CYSTOSCOPY WITH REMOVAL OF BLADDER CALCULUS;  Surgeon: [name], MD;  Location: [facility];  Service: Urology;  Laterality: N/A;   ELBOW SURGERY      bone spur - right   LEFT HEART CATH AND CORONARY ANGIOGRAPHY N/A 03/19/2018   Procedure: LEFT HEART CATH AND CORONARY ANGIOGRAPHY;  Surgeon: Belva Crome, MD;  Location: [facility];  Service: Cardiovascular;  Laterality: N/A;   ORIF ANKLE FRACTURE Left 05/17/2015   Procedure: OPEN REDUCTION INTERNAL FIXATION (ORIF) ANKLE FRACTURE;  Surgeon: Meredith Pel, MD;  Location: [facility];  Service: Orthopedics;  Laterality: Left;   PROSTATECTOMY  2007   URINARY SPHINCTER IMPLANT N/A 05/21/2019   Procedure: IMPLANTATION OF ARTIFICIAL URINARY SPHINCTER CYSTOSCOPY;  Surgeon: Bjorn Loser, MD;  Location: [facility];  Service: Urology;  Laterality: N/A;   VASECTOMY      Outpatient Medications Prior to Visit  Medication Sig Dispense Refill   aspirin 81 MG chewable tablet Chew 1 tablet (81 mg total) by mouth daily. 30 tablet 1   dorzolamide-timolol (COSOPT) 22.3-6.8 MG/ML ophthalmic solution Place 1 drop into both eyes every morning.     glucose blood (ACCU-CHEK AVIVA) test strip 1 each by Other route 2 (two) times daily. 100 each 3   amLODipine (NORVASC) 5 MG tablet Take 1 tablet by mouth twice daily 180 tablet 0   atorvastatin (LIPITOR) 20 MG tablet TAKE 1 TABLET BY  MOUTH AT BEDTIME . APPOINTMENT REQUIRED FOR FUTURE REFILLS 90 tablet 1   glipiZIDE (GLUCOTROL) 5 MG tablet TAKE 1 TABLET BY MOUTH ONCE DAILY BEFORE BREAKFAST 90 tablet 0   losartan (COZAAR) 50 MG tablet TAKE 1 TABLET BY MOUTH AT BEDTIME 90 tablet 1   No facility-administered medications prior to visit.    Allergies  Allergen Reactions   Ace Inhibitors Swelling    Tongue swelling. Reaction to benazepril only.    ROS neg/noncontributory except as noted HPI/below Occ dizziness Some OA-knees Chronic DOE      Objective:     BP 130/80 (BP Location: Left Arm, Patient Position: Sitting, Cuff Size: Normal)   Pulse 75   Temp 97.9 F (36.6 C) (Temporal)   Ht '5\' 8"'$  (1.610 m)   Wt 223 lb 3.2 oz (101.2 kg)   SpO2 99%    BMI 33.94 kg/m  Wt Readings from Last 3 Encounters:  11/18/21 223 lb 3.2 oz (101.2 kg)  11/15/21 223 lb (101.2 kg)  11/11/21 (P) 226 lb 6 oz (102.7 kg)    Physical Exam   Gen: WDWN NAD OWM HEENT: NCAT, conjunctiva not injected, sclera nonicteric NECK:  supple, no thyromegaly, no nodes, no carotid bruits CARDIAC: RRR, S1S2+, no murmur. DP1+B LUNGS: CTAB. No wheezes ABDOMEN:  BS+, soft, NTND, No HSM, no masses EXT: tr edema MSK: no gross abnormalities.  NEURO: A&O x3.  CN II-XII intact.  PSYCH: normal mood. Good eye contact     Assessment & Plan:   Problem List Items Addressed This Visit       Cardiovascular and Mediastinum   Essential hypertension   Relevant Medications   amLODipine (NORVASC) 5 MG tablet   atorvastatin (LIPITOR) 20 MG tablet   losartan (COZAAR) 50 MG tablet   Other Relevant Orders   Comprehensive metabolic panel   CBC with Differential/Platelet     Genitourinary   Prostate cancer ([place]) - Primary     Other   Dyslipidemia   Relevant Medications   atorvastatin (LIPITOR) 20 MG tablet   Other Relevant Orders   Lipid panel   Other Visit Diagnoses     Type 2 diabetes mellitus with stage 3 chronic kidney disease, without long-term current use of insulin (HCC)       Relevant Medications   atorvastatin (LIPITOR) 20 MG tablet   glipiZIDE (GLUCOTROL) 5 MG tablet   losartan (COZAAR) 50 MG tablet   Other Relevant Orders   Comprehensive metabolic panel   Hemoglobin A1c   Lipid panel   TSH   CBC with Differential/Platelet      HTN-chronic.  Well controlled.  Cont amlodipine '5mg'$  and losartan '50mg'$ .  Check cbc,cmp HLD-chronic.  Controlled.  Cont lipitor '20mg'$ .  Check cmp, lipids DM type 2-chronic.  Controlled.  Cont glipizide '5mg'$ .  Check tsh,cmp,lipids,a1c Prostate ca w/mets-managed by urol.  Will be getting rad to mets.  F/u 6 mo  Meds ordered this encounter  Medications   amLODipine (NORVASC) 5 MG tablet    Sig: Take 1 tablet (5 mg total) by mouth  2 (two) times daily.    Dispense:  180 tablet    Refill:  3   atorvastatin (LIPITOR) 20 MG tablet    Sig: TAKE 1 TABLET BY MOUTH AT BEDTIME . APPOINTMENT REQUIRED FOR FUTURE REFILLS    Dispense:  90 tablet    Refill:  3   glipiZIDE (GLUCOTROL) 5 MG tablet    Sig: Take 1 tablet (5 mg total) by mouth daily before  breakfast.    Dispense:  90 tablet    Refill:  3   losartan (COZAAR) 50 MG tablet    Sig: Take 1 tablet (50 mg total) by mouth at bedtime.    Dispense:  90 tablet    Refill:  3    Wellington Hampshire, MD

## 2021-11-19 DIAGNOSIS — Z191 Hormone sensitive malignancy status: Secondary | ICD-10-CM | POA: Diagnosis not present

## 2021-11-19 DIAGNOSIS — C7951 Secondary malignant neoplasm of bone: Secondary | ICD-10-CM | POA: Diagnosis not present

## 2021-11-19 NOTE — Progress Notes (Signed)
Labs mostly ok Good cholesterol is always low-try to get back to exercise A1C is a little elevated-still acceptable for an 81 year old.  Work a little more on diet/exercise to keep it from going higher.

## 2021-11-25 ENCOUNTER — Ambulatory Visit
Admission: RE | Admit: 2021-11-25 | Discharge: 2021-11-25 | Disposition: A | Discharge status: Home / Self Care | Payer: Medicare Other | Source: Ambulatory Visit | Attending: Radiation Oncology | Admitting: Radiation Oncology

## 2021-11-25 ENCOUNTER — Other Ambulatory Visit: Payer: Self-pay

## 2021-11-25 DIAGNOSIS — C61 Malignant neoplasm of prostate: Secondary | ICD-10-CM | POA: Diagnosis not present

## 2021-11-25 DIAGNOSIS — Z191 Hormone sensitive malignancy status: Secondary | ICD-10-CM | POA: Diagnosis not present

## 2021-11-25 DIAGNOSIS — C7951 Secondary malignant neoplasm of bone: Secondary | ICD-10-CM | POA: Diagnosis not present

## 2021-11-25 DIAGNOSIS — Z510 Encounter for antineoplastic radiation therapy: Secondary | ICD-10-CM | POA: Diagnosis not present

## 2021-11-25 LAB — RAD ONC ARIA SESSION SUMMARY
Course Elapsed Days: 0
Plan Fractions Treated to Date: 1
Plan Fractions Treated to Date: 1
Plan Prescribed Dose Per Fraction: 10 Gy
Plan Prescribed Dose Per Fraction: 5 Gy
Plan Total Fractions Prescribed: 10
Plan Total Fractions Prescribed: 5
Plan Total Prescribed Dose: 50 Gy
Plan Total Prescribed Dose: 50 Gy
Reference Point Dosage Given to Date: 10 Gy
Reference Point Dosage Given to Date: 5 Gy
Reference Point Session Dosage Given: 10 Gy
Reference Point Session Dosage Given: 5 Gy
Session Number: 1

## 2021-11-26 ENCOUNTER — Other Ambulatory Visit: Payer: Self-pay

## 2021-11-26 ENCOUNTER — Ambulatory Visit
Admission: RE | Admit: 2021-11-26 | Discharge: 2021-11-26 | Disposition: A | Discharge status: Home / Self Care | Payer: Medicare Other | Source: Ambulatory Visit | Attending: Radiation Oncology | Admitting: Radiation Oncology

## 2021-11-26 ENCOUNTER — Ambulatory Visit: Payer: Medicare Other | Admitting: Radiation Oncology

## 2021-11-26 DIAGNOSIS — Z191 Hormone sensitive malignancy status: Secondary | ICD-10-CM | POA: Diagnosis not present

## 2021-11-26 DIAGNOSIS — C7951 Secondary malignant neoplasm of bone: Secondary | ICD-10-CM | POA: Diagnosis not present

## 2021-11-26 DIAGNOSIS — C61 Malignant neoplasm of prostate: Secondary | ICD-10-CM | POA: Diagnosis not present

## 2021-11-26 DIAGNOSIS — Z510 Encounter for antineoplastic radiation therapy: Secondary | ICD-10-CM | POA: Diagnosis not present

## 2021-11-26 LAB — RAD ONC ARIA SESSION SUMMARY
Course Elapsed Days: 1
Plan Fractions Treated to Date: 2
Plan Prescribed Dose Per Fraction: 5 Gy
Plan Total Fractions Prescribed: 10
Plan Total Prescribed Dose: 50 Gy
Reference Point Dosage Given to Date: 10 Gy
Reference Point Session Dosage Given: 5 Gy
Session Number: 2

## 2021-11-29 ENCOUNTER — Ambulatory Visit
Admission: RE | Admit: 2021-11-29 | Discharge: 2021-11-29 | Disposition: A | Discharge status: Home / Self Care | Payer: Medicare Other | Source: Ambulatory Visit | Attending: Radiation Oncology | Admitting: Radiation Oncology

## 2021-11-29 ENCOUNTER — Other Ambulatory Visit: Payer: Self-pay

## 2021-11-29 DIAGNOSIS — Z191 Hormone sensitive malignancy status: Secondary | ICD-10-CM | POA: Diagnosis not present

## 2021-11-29 DIAGNOSIS — C61 Malignant neoplasm of prostate: Secondary | ICD-10-CM | POA: Diagnosis not present

## 2021-11-29 DIAGNOSIS — Z510 Encounter for antineoplastic radiation therapy: Secondary | ICD-10-CM | POA: Diagnosis not present

## 2021-11-29 DIAGNOSIS — C7951 Secondary malignant neoplasm of bone: Secondary | ICD-10-CM | POA: Diagnosis not present

## 2021-11-29 LAB — RAD ONC ARIA SESSION SUMMARY
Course Elapsed Days: 4
Plan Fractions Treated to Date: 2
Plan Fractions Treated to Date: 3
Plan Prescribed Dose Per Fraction: 10 Gy
Plan Prescribed Dose Per Fraction: 5 Gy
Plan Total Fractions Prescribed: 10
Plan Total Fractions Prescribed: 5
Plan Total Prescribed Dose: 50 Gy
Plan Total Prescribed Dose: 50 Gy
Reference Point Dosage Given to Date: 15 Gy
Reference Point Dosage Given to Date: 20 Gy
Reference Point Session Dosage Given: 10 Gy
Reference Point Session Dosage Given: 5 Gy
Session Number: 3

## 2021-11-30 ENCOUNTER — Ambulatory Visit: Payer: Medicare Other | Admitting: Radiation Oncology

## 2021-11-30 ENCOUNTER — Other Ambulatory Visit: Payer: Self-pay

## 2021-11-30 ENCOUNTER — Ambulatory Visit
Admission: RE | Admit: 2021-11-30 | Discharge: 2021-11-30 | Disposition: A | Discharge status: Home / Self Care | Payer: Medicare Other | Source: Ambulatory Visit | Attending: Radiation Oncology | Admitting: Radiation Oncology

## 2021-11-30 DIAGNOSIS — Z191 Hormone sensitive malignancy status: Secondary | ICD-10-CM | POA: Diagnosis not present

## 2021-11-30 DIAGNOSIS — Z510 Encounter for antineoplastic radiation therapy: Secondary | ICD-10-CM | POA: Diagnosis not present

## 2021-11-30 DIAGNOSIS — C61 Malignant neoplasm of prostate: Secondary | ICD-10-CM | POA: Diagnosis not present

## 2021-11-30 DIAGNOSIS — C7951 Secondary malignant neoplasm of bone: Secondary | ICD-10-CM | POA: Diagnosis not present

## 2021-11-30 LAB — RAD ONC ARIA SESSION SUMMARY
Course Elapsed Days: 5
Plan Fractions Treated to Date: 4
Plan Prescribed Dose Per Fraction: 5 Gy
Plan Total Fractions Prescribed: 10
Plan Total Prescribed Dose: 50 Gy
Reference Point Dosage Given to Date: 20 Gy
Reference Point Session Dosage Given: 5 Gy
Session Number: 4

## 2021-12-01 ENCOUNTER — Other Ambulatory Visit: Payer: Self-pay

## 2021-12-01 ENCOUNTER — Ambulatory Visit
Admission: RE | Admit: 2021-12-01 | Discharge: 2021-12-01 | Disposition: A | Discharge status: Home / Self Care | Payer: Medicare Other | Source: Ambulatory Visit | Attending: Radiation Oncology | Admitting: Radiation Oncology

## 2021-12-01 DIAGNOSIS — Z510 Encounter for antineoplastic radiation therapy: Secondary | ICD-10-CM | POA: Diagnosis not present

## 2021-12-01 DIAGNOSIS — C7951 Secondary malignant neoplasm of bone: Secondary | ICD-10-CM | POA: Diagnosis not present

## 2021-12-01 DIAGNOSIS — Z191 Hormone sensitive malignancy status: Secondary | ICD-10-CM | POA: Diagnosis not present

## 2021-12-01 DIAGNOSIS — C61 Malignant neoplasm of prostate: Secondary | ICD-10-CM | POA: Diagnosis not present

## 2021-12-01 LAB — RAD ONC ARIA SESSION SUMMARY
Course Elapsed Days: 6
Plan Fractions Treated to Date: 3
Plan Fractions Treated to Date: 5
Plan Prescribed Dose Per Fraction: 10 Gy
Plan Prescribed Dose Per Fraction: 5 Gy
Plan Total Fractions Prescribed: 10
Plan Total Fractions Prescribed: 5
Plan Total Prescribed Dose: 50 Gy
Plan Total Prescribed Dose: 50 Gy
Reference Point Dosage Given to Date: 25 Gy
Reference Point Dosage Given to Date: 30 Gy
Reference Point Session Dosage Given: 10 Gy
Reference Point Session Dosage Given: 5 Gy
Session Number: 5

## 2021-12-02 ENCOUNTER — Ambulatory Visit
Admission: RE | Admit: 2021-12-02 | Discharge: 2021-12-02 | Disposition: A | Discharge status: Home / Self Care | Payer: Medicare Other | Source: Ambulatory Visit | Attending: Radiation Oncology | Admitting: Radiation Oncology

## 2021-12-02 ENCOUNTER — Other Ambulatory Visit: Payer: Self-pay

## 2021-12-02 DIAGNOSIS — Z510 Encounter for antineoplastic radiation therapy: Secondary | ICD-10-CM | POA: Diagnosis not present

## 2021-12-02 DIAGNOSIS — Z191 Hormone sensitive malignancy status: Secondary | ICD-10-CM | POA: Diagnosis not present

## 2021-12-02 DIAGNOSIS — C7951 Secondary malignant neoplasm of bone: Secondary | ICD-10-CM | POA: Diagnosis not present

## 2021-12-02 DIAGNOSIS — C61 Malignant neoplasm of prostate: Secondary | ICD-10-CM | POA: Diagnosis not present

## 2021-12-02 LAB — RAD ONC ARIA SESSION SUMMARY
Course Elapsed Days: 7
Plan Fractions Treated to Date: 6
Plan Prescribed Dose Per Fraction: 5 Gy
Plan Total Fractions Prescribed: 10
Plan Total Prescribed Dose: 50 Gy
Reference Point Dosage Given to Date: 30 Gy
Reference Point Session Dosage Given: 5 Gy
Session Number: 6

## 2021-12-03 ENCOUNTER — Ambulatory Visit
Admission: RE | Admit: 2021-12-03 | Discharge: 2021-12-03 | Disposition: A | Discharge status: Home / Self Care | Payer: Medicare Other | Source: Ambulatory Visit | Attending: Radiation Oncology | Admitting: Radiation Oncology

## 2021-12-03 ENCOUNTER — Other Ambulatory Visit: Payer: Self-pay

## 2021-12-03 DIAGNOSIS — C61 Malignant neoplasm of prostate: Secondary | ICD-10-CM

## 2021-12-03 DIAGNOSIS — C7951 Secondary malignant neoplasm of bone: Secondary | ICD-10-CM | POA: Diagnosis not present

## 2021-12-03 DIAGNOSIS — Z510 Encounter for antineoplastic radiation therapy: Secondary | ICD-10-CM | POA: Diagnosis not present

## 2021-12-03 DIAGNOSIS — Z191 Hormone sensitive malignancy status: Secondary | ICD-10-CM | POA: Diagnosis not present

## 2021-12-03 LAB — RAD ONC ARIA SESSION SUMMARY
Course Elapsed Days: 8
Plan Fractions Treated to Date: 4
Plan Fractions Treated to Date: 7
Plan Prescribed Dose Per Fraction: 10 Gy
Plan Prescribed Dose Per Fraction: 5 Gy
Plan Total Fractions Prescribed: 10
Plan Total Fractions Prescribed: 5
Plan Total Prescribed Dose: 50 Gy
Plan Total Prescribed Dose: 50 Gy
Reference Point Dosage Given to Date: 35 Gy
Reference Point Dosage Given to Date: 40 Gy
Reference Point Session Dosage Given: 10 Gy
Reference Point Session Dosage Given: 5 Gy
Session Number: 7

## 2021-12-06 ENCOUNTER — Other Ambulatory Visit: Payer: Self-pay

## 2021-12-06 ENCOUNTER — Ambulatory Visit
Admission: RE | Admit: 2021-12-06 | Discharge: 2021-12-06 | Disposition: A | Discharge status: Home / Self Care | Payer: Medicare Other | Source: Ambulatory Visit | Attending: Radiation Oncology | Admitting: Radiation Oncology

## 2021-12-06 DIAGNOSIS — C61 Malignant neoplasm of prostate: Secondary | ICD-10-CM | POA: Diagnosis not present

## 2021-12-06 DIAGNOSIS — C7951 Secondary malignant neoplasm of bone: Secondary | ICD-10-CM | POA: Diagnosis not present

## 2021-12-06 DIAGNOSIS — Z191 Hormone sensitive malignancy status: Secondary | ICD-10-CM | POA: Diagnosis not present

## 2021-12-06 DIAGNOSIS — Z510 Encounter for antineoplastic radiation therapy: Secondary | ICD-10-CM | POA: Diagnosis not present

## 2021-12-06 LAB — RAD ONC ARIA SESSION SUMMARY
Course Elapsed Days: 11
Plan Fractions Treated to Date: 5
Plan Fractions Treated to Date: 8
Plan Prescribed Dose Per Fraction: 10 Gy
Plan Prescribed Dose Per Fraction: 5 Gy
Plan Total Fractions Prescribed: 10
Plan Total Fractions Prescribed: 5
Plan Total Prescribed Dose: 50 Gy
Plan Total Prescribed Dose: 50 Gy
Reference Point Dosage Given to Date: 40 Gy
Reference Point Dosage Given to Date: 50 Gy
Reference Point Session Dosage Given: 10 Gy
Reference Point Session Dosage Given: 5 Gy
Session Number: 8

## 2021-12-07 ENCOUNTER — Ambulatory Visit
Admission: RE | Admit: 2021-12-07 | Discharge: 2021-12-07 | Disposition: A | Discharge status: Home / Self Care | Payer: Medicare Other | Source: Ambulatory Visit | Attending: Radiation Oncology | Admitting: Radiation Oncology

## 2021-12-07 ENCOUNTER — Other Ambulatory Visit: Payer: Self-pay

## 2021-12-07 DIAGNOSIS — C61 Malignant neoplasm of prostate: Secondary | ICD-10-CM | POA: Diagnosis not present

## 2021-12-07 DIAGNOSIS — Z510 Encounter for antineoplastic radiation therapy: Secondary | ICD-10-CM | POA: Diagnosis not present

## 2021-12-07 DIAGNOSIS — Z191 Hormone sensitive malignancy status: Secondary | ICD-10-CM | POA: Diagnosis not present

## 2021-12-07 DIAGNOSIS — C7951 Secondary malignant neoplasm of bone: Secondary | ICD-10-CM | POA: Diagnosis not present

## 2021-12-07 LAB — RAD ONC ARIA SESSION SUMMARY
Course Elapsed Days: 12
Plan Fractions Treated to Date: 9
Plan Prescribed Dose Per Fraction: 5 Gy
Plan Total Fractions Prescribed: 10
Plan Total Prescribed Dose: 50 Gy
Reference Point Dosage Given to Date: 45 Gy
Reference Point Session Dosage Given: 5 Gy
Session Number: 9

## 2021-12-08 ENCOUNTER — Ambulatory Visit
Admission: RE | Admit: 2021-12-08 | Discharge: 2021-12-08 | Disposition: A | Discharge status: Home / Self Care | Payer: Medicare Other | Source: Ambulatory Visit | Attending: Radiation Oncology | Admitting: Radiation Oncology

## 2021-12-08 ENCOUNTER — Other Ambulatory Visit: Payer: Self-pay

## 2021-12-08 ENCOUNTER — Encounter: Payer: Self-pay | Admitting: Urology

## 2021-12-08 ENCOUNTER — Ambulatory Visit: Payer: Medicare Other

## 2021-12-08 DIAGNOSIS — Z191 Hormone sensitive malignancy status: Secondary | ICD-10-CM | POA: Diagnosis not present

## 2021-12-08 DIAGNOSIS — Z510 Encounter for antineoplastic radiation therapy: Secondary | ICD-10-CM | POA: Diagnosis not present

## 2021-12-08 DIAGNOSIS — C61 Malignant neoplasm of prostate: Secondary | ICD-10-CM | POA: Diagnosis not present

## 2021-12-08 DIAGNOSIS — C7951 Secondary malignant neoplasm of bone: Secondary | ICD-10-CM | POA: Diagnosis not present

## 2021-12-08 LAB — RAD ONC ARIA SESSION SUMMARY
Course Elapsed Days: 13
Plan Fractions Treated to Date: 10
Plan Prescribed Dose Per Fraction: 5 Gy
Plan Total Fractions Prescribed: 10
Plan Total Prescribed Dose: 50 Gy
Reference Point Dosage Given to Date: 50 Gy
Reference Point Session Dosage Given: 5 Gy
Session Number: 10

## 2022-01-06 NOTE — Progress Notes (Signed)
                                                                                                                                                             Patient Name: Rodney [name] MRN: [id number] DOB: 04-12-1940 Referring Physician: Dutch Gray (Profile Not Attached) Date of Service: 12/08/2021 [facility] [place]                                                        End Of Treatment Note  Diagnoses: 185-Malignant neoplasm of prostate C61-Malignant neoplasm of prostate  Cancer Staging: 81 y.o. 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   Intent: Curative  Radiation Treatment Dates: 11/25/2021 through 12/08/2021 Site Technique Total Dose (Gy) Dose per Fx (Gy) Completed Fx Beam Energies  Lumbar Spine: Spine_L3 IMRT 50/50 10 5/5 6XFFF  Pelvis: Pelvis_Nodes IMRT 50/50 5 10/10 6XFFF   Narrative: The patient tolerated radiation therapy relatively well. He did report mild fatigue and mild nausea but denied any abdomina; pain, vomiting or diarrhea.  Plan: The patient will receive a call in about one month from the radiation oncology department. He will continue follow up with Dr. Alinda Money as well. ------------------------------------------------   Tyler Pita, MD [place]: [phone]  Fax: [phone] [email or web address]  Skype  [email or web address]

## 2022-01-31 NOTE — Progress Notes (Signed)
  Radiation Oncology         [phone] ________________________________  Name: Rodney [name] MRN: [id number]  Date of Service: 02/01/2022  DOB: Oct 16, 1940  Post Treatment Telephone Note  Diagnosis:  82 y.o. 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   Intent: Curative  Radiation Treatment Dates: 11/25/2021 through 12/08/2021 Site Technique Total Dose (Gy) Dose per Fx (Gy) Completed Fx Beam Energies  Lumbar Spine: Spine_L3 IMRT 50/50 10 5/5 6XFFF  Pelvis: Pelvis_Nodes IMRT 50/50 5 10/10 6XFFF   (as documented in provider EOT note)   The patient was available for call today.  The patient did note fatigue during radiation but has since improved. The patient did not note skin changes in the field of radiation during therapy. The patient has noticed improvement in pain in the area(s) treated with radiation. The patient is not taking dexamethasone. The patient does not have symptoms of  weakness or loss of control of the extremities. The patient does not have symptoms of headache. The patient does not have symptoms of seizure or uncontrolled movement. The patient does not have symptoms of changes in vision. The patient does not have changes in speech. The patient does not have confusion. The patient is not currently scheduled for ongoing care with Dr. Alinda Money in medical oncology. The patient was encouraged to call if he develop concerns or questions regarding radiation. Patient currently has no complaints.  This concludes the interview.   Leandra Kern, LPN

## 2022-02-01 ENCOUNTER — Ambulatory Visit
Admission: RE | Admit: 2022-02-01 | Discharge: 2022-02-01 | Disposition: A | Discharge status: Home / Self Care | Payer: Medicare Other | Source: Ambulatory Visit | Attending: Radiation Oncology | Admitting: Radiation Oncology

## 2022-02-08 NOTE — Progress Notes (Incomplete)
Cardiology Office Note:    Date:  02/08/2022   ID:  ASAIAH SCARBER, DOB 10-Feb-1940, MRN [id number]  PCP:  Tawnya Crook, MD  Cardiologist:  New to me Previously Dr Tamala Julian   Referring MD: Tawnya Crook, MD     History of Present Illness:    KELLI EGOLF is a 82 y.o. male with a hx of  diabetes mellitus type 2, hyperlipidemia, essential hypertension, Reiter's syndrome and TIA Previously seen by Dr Tamala Julian   Hospitalized  03/06/19 for right sided hemiplegia and numbness that was transient. ? Small vessel dx. MRI with small remoted lacunar infarct and micro vascular dx. No carotid stenosis on CTA  Monitor done 04/30/21 with no PAF   Prior history of SSCP Cath done 03/19/18 no obstructive dx Tightest lesion 50-60% diffuse small D1.  ETT 03/29/18 with no chronotropic incompetence and HTN response Amlodipine increased to 10 mg daily   TTE 03/04/21 EF 60-65% moderate LVH Normal RV diastolic paramaters non diagnostic   Has oligometastatic prostate CA L3 spinal met and some adenopathy Getting XRT of lumbar spine and pelvis Post prostatectomy   ***  No symptoms or concerns since discharge.  Wore monitor for 4 weeks.  Discontinued at last night.  Results are obviously not back.  Treated with aspirin and Plavix for 18 days.  Complete resolution of symptoms.  Now only on aspirin 81 mg/day.  Past Medical History:  Diagnosis Date   Bladder calculus    CAD (coronary artery disease)    non obs    CKD (chronic kidney disease), stage III ([place])    " i have 35-40% of my kidney function"; mgd by pcp    COLONIC POLYPS, HX OF 12/02/2008   DIABETES MELLITUS, TYPE II 07/19/2007   Dyspnea    reports sob over the last 2 years , underwent cath  in feb 2020 , reports cath was clean , referred to pulmonologist that stopped his Actos and encouraged weight loss  ; reports today minimum improvement in breathing , appt with pulm in June 2020   Erectile dysfunction    GERD (gastroesophageal reflux disease)     Glaucoma    GOUT 07/19/2007   History of kidney stones    HYPERLIPIDEMIA 12/02/2008   HYPERTENSION 07/19/2007   Kidney stones    Prostate cancer ([place]) 2007   Reiter syndrome    incomplete   TIA (transient ischemic attack)    Tubular adenoma of colon    Vitamin D deficiency     Past Surgical History:  Procedure Laterality Date   bladder neck dilation     CATARACT EXTRACTION     both eyes- 2015   COLONOSCOPY  11/15/2021   12-11-14-polyps   CYSTOSCOPY N/A 06/11/2018   Procedure: CYSTOSCOPY WITH REMOVAL OF BLADDER CALCULUS;  Surgeon: [name], MD;  Location: [facility];  Service: Urology;  Laterality: N/A;   ELBOW SURGERY     bone spur - right   LEFT HEART CATH AND CORONARY ANGIOGRAPHY N/A 03/19/2018   Procedure: LEFT HEART CATH AND CORONARY ANGIOGRAPHY;  Surgeon: Belva Crome, MD;  Location: [facility];  Service: Cardiovascular;  Laterality: N/A;   ORIF ANKLE FRACTURE Left 05/17/2015   Procedure: OPEN REDUCTION INTERNAL FIXATION (ORIF) ANKLE FRACTURE;  Surgeon: Meredith Pel, MD;  Location: [facility];  Service: Orthopedics;  Laterality: Left;   PROSTATECTOMY  2007   URINARY SPHINCTER IMPLANT N/A 05/21/2019   Procedure: IMPLANTATION OF ARTIFICIAL URINARY SPHINCTER CYSTOSCOPY;  Surgeon:  Bjorn Loser, MD;  Location: [facility];  Service: Urology;  Laterality: N/A;   VASECTOMY      Current Medications: No outpatient medications have been marked as taking for the 02/21/22 encounter (Appointment) with Josue Hector, MD.     Allergies:   Ace inhibitors   Social History   Socioeconomic History   Marital status: Married    Spouse name: Not on file   Number of children: 2   Years of education: Not on file   Highest education level: Not on file  Occupational History   Not on file  Tobacco Use   Smoking status: Former    Packs/day: 1.00    Years: 30.00    Total pack years: 30.00    Types: Cigarettes    Quit date: 01/24/1978    Years since quitting: 44.0    Smokeless tobacco: Never  Vaping Use   Vaping Use: Never used  Substance and Sexual Activity   Alcohol use: Not Currently    Comment: occasionally -socially   Drug use: No   Sexual activity: Not Currently    Comment: 2 childern  Other Topics Concern   Not on file  Social History Narrative   6 grands 2 greats      Was competitive fisherman.   Former Engineer, maintenance (IT) of Radio broadcast assistant Strain: Not on [employer] Insecurity: Not on file  Transportation Needs: Not on file  Physical Activity: Not on file  Stress: Not on file  Social Connections: Not on file     Family History: The patient's family history includes Cancer in his sister. There is no history of Colon cancer, Esophageal cancer, Rectal cancer, Stomach cancer, Lung disease, Colon polyps, or Pancreatic cancer.  ROS:   Please see the history of present illness.    Had complete resolution of right-sided numbness and weakness.  He is doing well.  Ambulated him without difficulty.  Was on aspirin and Plavix for 18 days.  All other systems reviewed and are negative.  EKGs/Labs/Other Studies Reviewed:    The following studies were reviewed today: ECHOCARDIOGRAM 03/19/2018: Diagnostic Dominance: Right  Left main, proximal LAD and proximal circumflex calcification noted on cine fluoroscopy. 25% distal left main. LAD is widely patent in the ostium.  Beyond the first septal perforator and diagonal contains diffuse 40% narrowing.  The first diagonal contains diffuse mid vessel 50 to 60% narrowing. Ostial circumflex contains eccentric calcified 30 to 40% narrowing.  Obtuse marginal branches are widely patent.  The third obtuse marginal is dominant of the 4 marginal branches. Left ventriculography was of poor quality.  Anterior wall moves normally.  EF is estimated to be greater than 50%.  LVEDP was normal at 15 mmHg  ECHOCARDIOGRAM 03/04/2021: IMPRESSIONS   1. Left ventricular ejection fraction, by  estimation, is 60 to 65%. The  left ventricle has normal function. The left ventricle has no regional  wall motion abnormalities. There is moderate concentric left ventricular  hypertrophy. Left ventricular  diastolic function could not be evaluated.   2. Right ventricular systolic function is normal. The right ventricular  size is normal. There is normal pulmonary artery systolic pressure.   3. The mitral valve is normal in structure. No evidence of mitral valve  regurgitation. No evidence of mitral stenosis.   4. The aortic valve is tricuspid. There is mild calcification of the  aortic valve. There is mild thickening of the aortic valve. Aortic valve  regurgitation is not  visualized. No aortic stenosis is present.   5. The inferior vena cava is normal in size with greater than 50%  respiratory variability, suggesting right atrial pressure of 3 mmHg.   Comparison(s): No significant change from prior study.   Conclusion(s)/Recommendation(s): Otherwise normal echocardiogram, with  minor abnormalities described in the report.   EKG:  EKG 03/06/2021 revealed normal sinus rhythm with left anterior hemiblock.  Recent Labs: 11/18/2021: ALT 18; BUN 21; Creatinine, Ser 1.57; Hemoglobin 13.0; Platelets 260.0; Potassium 4.7; Sodium 139; TSH 2.63  Recent Lipid Panel    Component Value Date/Time   CHOL 105 11/18/2021 1140   TRIG 156.0 (H) 11/18/2021 1140   HDL 27.90 (L) 11/18/2021 1140   CHOLHDL 4 11/18/2021 1140   VLDL 31.2 11/18/2021 1140   LDLCALC 46 11/18/2021 1140   LDLCALC 61 09/11/2019 0932   LDLDIRECT 166.1 09/19/2013 0922    Physical Exam:    VS:  There were no vitals taken for this visit.    Wt Readings from Last 3 Encounters:  11/18/21 223 lb 3.2 oz (101.2 kg)  11/15/21 223 lb (101.2 kg)  11/11/21 (P) 226 lb 6 oz (102.7 kg)     Affect appropriate Overweight HEENT: normal Neck supple with no adenopathy JVP normal no bruits no thyromegaly Lungs clear with no wheezing  and good diaphragmatic motion Heart:  S1/S2 no murmur, no rub, gallop or click PMI normal Abdomen: benighn, BS positve, no tenderness, no AAA no bruit.  No HSM or HJR Distal pulses intact with no bruits No edema Neuro non-focal Skin warm and dry No muscular weakness    PLAN:     CAD:  non obstructive continue ASA/statin TIA:  etiology unclear lacunar and small vessel dx  HTN:  continue amlodipine and losartan HLD:  continue lipitor LDL 46 DM:  Needs weight loss and better diet A1c 7.3 f/u primary Prostate Cancer:  F/U Dr Tammi Klippel and Alinda Money post prostatectomy and getting XRT PSA 21/7    ***   Signed, Jenkins Rouge, MD  02/08/2022 9:34 AM    [place]

## 2022-02-21 ENCOUNTER — Ambulatory Visit: Payer: Medicare Other | Admitting: Cardiovascular Disease

## 2022-03-17 ENCOUNTER — Telehealth: Payer: Self-pay

## 2022-03-17 NOTE — Telephone Encounter (Signed)
Contacted Rodney [name] to schedule their annual wellness visit. Appointment made for 03/22/22.  Norton Blizzard, [place] ([facility])  [facility] [phone]

## 2022-03-22 ENCOUNTER — Ambulatory Visit (INDEPENDENT_AMBULATORY_CARE_PROVIDER_SITE_OTHER): Payer: Medicare Other

## 2022-03-22 VITALS — Wt 223.0 lb

## 2022-03-22 DIAGNOSIS — Z0000 Encounter for general adult medical examination without abnormal findings: Secondary | ICD-10-CM | POA: Diagnosis not present

## 2022-03-22 NOTE — Progress Notes (Signed)
I connected with  [name] on 03/22/22 by a audio enabled telemedicine application and verified that I am speaking with the correct person using two identifiers.  Patient Location: Home  Provider Location: Office/Clinic  I discussed the limitations of evaluation and management by telemedicine. The patient expressed understanding and agreed to proceed.   Subjective:   Rodney [name] is a 82 y.o. male who presents for Medicare Annual/Subsequent preventive examination.  Review of Systems     Cardiac Risk Factors include: advanced age (>40mn, >>67women);diabetes mellitus;dyslipidemia;hypertension;obesity (BMI >30kg/m2);male gender     Objective:    Today's Vitals   03/22/22 1104  Weight: 223 lb (101.2 kg)   Body mass index is 33.91 kg/m.     03/22/2022   11:07 AM 11/11/2021   12:41 PM 03/04/2021    7:31 AM 05/21/2019    3:34 PM 05/13/2019    8:14 AM 06/29/2018    2:24 AM 06/08/2018   10:32 AM  Advanced Directives  Does Patient Have a Medical Advance Directive? Yes Yes No Yes Yes Yes Yes  Type of AParamedicof ACamp PointLiving will Living will;Healthcare Power of [place]eferLiving will [place] will [place]iver ParkLiving will Living will  Does patient want to make changes to medical advance directive?    No - Patient declined   No - Patient declined  Copy of HRising [place] Chart? No - copy requested     No - copy requested   Would patient like information on creating a medical advance directive?      No - Patient declined     Current Medications (verified) Outpatient Encounter Medications as of 03/22/2022  Medication Sig   amLODipine (NORVASC) 5 MG tablet Take 1 tablet (5 mg total) by mouth 2 (two) times daily.   atorvastatin (LIPITOR) 20 MG tablet TAKE 1 TABLET BY MOUTH AT BEDTIME . APPOINTMENT REQUIRED FOR FUTURE REFILLS   dorzolamide-timolol (COSOPT)  22.3-6.8 MG/ML ophthalmic solution Place 1 drop into both eyes every morning.   glipiZIDE (GLUCOTROL) 5 MG tablet Take 1 tablet (5 mg total) by mouth daily before breakfast.   glucose blood (ACCU-CHEK AVIVA) test strip 1 each by Other route 2 (two) times daily.   losartan (COZAAR) 50 MG tablet Take 1 tablet (50 mg total) by mouth at bedtime.   aspirin 81 MG chewable tablet Chew 1 tablet (81 mg total) by mouth daily. (Patient not taking: Reported on 03/22/2022)   No facility-administered encounter medications on file as of 03/22/2022.    Allergies (verified) Ace inhibitors   History: Past Medical History:  Diagnosis Date   Bladder calculus    CAD (coronary artery disease)    non obs    [place]    " i have 35-40% of my kidney function"; mgd by pcp    COLONIC POLYPS, HX OF 12/02/2008   DIABETES MELLITUS, TYPE II 07/19/2007   Dyspnea    reports sob over the last 2 years , underwent cath  in feb 2020 , reports cath was clean , referred to pulmonologist that stopped his Actos and encouraged weight loss  ; reports today minimum improvement in breathing , appt with pulm in June 2020   Erectile dysfunction    GERD (gastroesophageal reflux disease)    Glaucoma    GOUT 07/19/2007   History of kidney stones    HYPERLIPIDEMIA 12/02/2008   HYPERTENSION 07/19/2007  Kidney stones    Prostate cancer ([place]) 2007   Reiter syndrome    incomplete   TIA (transient ischemic attack)    Tubular adenoma of colon    Vitamin D deficiency    Past Surgical History:  Procedure Laterality Date   bladder neck dilation     CATARACT EXTRACTION     both eyes- 2015   COLONOSCOPY  11/15/2021   12-11-14-polyps   CYSTOSCOPY N/A 06/11/2018   Procedure: CYSTOSCOPY WITH REMOVAL OF BLADDER CALCULUS;  Surgeon: [name], MD;  [facility];  Service: Urology;  Laterality: N/A;   ELBOW SURGERY     bone spur - right   LEFT HEART CATH AND CORONARY ANGIOGRAPHY N/A  03/19/2018   Procedure: LEFT HEART CATH AND CORONARY ANGIOGRAPHY;  Surgeon: Belva Crome, MD;  Location: [facility];  Service: Cardiovascular;  Laterality: N/A;   ORIF ANKLE FRACTURE Left 05/17/2015   Procedure: OPEN REDUCTION INTERNAL FIXATION (ORIF) ANKLE FRACTURE;  Surgeon: Meredith Pel, MD;  [facility];  Service: Orthopedics;  Laterality: Left;   PROSTATECTOMY  2007   URINARY SPHINCTER IMPLANT N/A 05/21/2019   Procedure: IMPLANTATION OF ARTIFICIAL URINARY SPHINCTER CYSTOSCOPY;  Surgeon: Bjorn Loser, MD;  [facility];  Service: Urology;  Laterality: N/A;   VASECTOMY     Family History  Problem Relation Age of Onset   Cancer Sister        metastatic colon ca   Colon cancer Neg Hx    Esophageal cancer Neg Hx    Rectal cancer Neg Hx    Stomach cancer Neg Hx    Lung disease Neg Hx    Colon polyps Neg Hx    Pancreatic cancer Neg Hx    Social History   Socioeconomic History   Marital status: Married    Spouse name: Not on file   Number of children: 2   Years of education: Not on file   Highest education level: Not on file  Occupational History   Not on file  Tobacco Use   Smoking status: Former    Packs/day: 1.00    Years: 30.00    Total pack years: 30.00    Types: Cigarettes    Quit date: 01/24/1978    Years since quitting: 44.1   Smokeless tobacco: Never  Vaping Use   Vaping Use: Never used  Substance and Sexual Activity   Alcohol use: Not Currently    Comment: occasionally -socially   Drug use: No   Sexual activity: Not Currently    Comment: 2 childern  Other Topics Concern   Not on file  Social History Narrative   6 grands 2 greats      Was competitive fisherman.   Former Architect   Social Determinants of Radio broadcast assistant Strain: [place]  (03/22/2022)   Overall Financial Resource Strain (CARDIA)    Difficulty of Paying Living Expenses: Not hard at all  Food Insecurity: No Food Insecurity (03/22/2022)   Hunger  Vital Sign    Worried About Running Out of Food in the Last Year: Never true    [place] in the Last Year: Never true  Transportation Needs: No Transportation Needs (03/22/2022)   PRAPARE - Hydrologist (Medical): No    Lack of Transportation (Non-Medical): No  Physical Activity: Inactive (03/22/2022)   Exercise Vital Sign    Days of Exercise per Week: 0 days    Minutes of Exercise  per Session: 0 min  Stress: No Stress Concern Present (03/22/2022)   [place]    Feeling of Stress : Not at all  Social Connections: [place] (03/22/2022)   Social Connection and Isolation Panel [NHANES]    Frequency of Communication with Friends and Family: More than three times a week    Frequency of Social Gatherings with Friends and Family: More than three times a week    Attends Religious Services: More than 4 times per year    Active Member of [employer] or Organizations: Yes    Attends Archivist Meetings: 1 to 4 times per year    Marital Status: Married    Tobacco Counseling Counseling given: Not Answered   Clinical Intake:  Pre-visit preparation completed: Yes  Pain : No/denies pain     BMI - recorded: 33.91 Nutritional Status: BMI > 30  Obese Nutritional Risks: None Diabetes: Yes CBG done?: No CBG resulted in Enter/ Edit results?: No Did pt. bring in CBG monitor from home?: No  How often do you need to have someone help you when you read instructions, pamphlets, or other written materials from your doctor or pharmacy?: 1 - Never  Diabetic?Nutrition Risk Assessment:  Has the patient had any N/V/D within the last 2 months?  No  Does the patient have any non-healing wounds?  No  Has the patient had any unintentional weight loss or weight gain?  No   Diabetes:  Is the patient diabetic?  Yes  If diabetic, was a CBG obtained today?  No  Did the patient bring in their  glucometer from home?  No  How often do you monitor your CBG's? N/a.   Financial Strains and Diabetes Management:  Are you having any financial strains with the device, your supplies or your medication? No .  Does the patient want to be seen by Chronic Care Management for management of their diabetes?  No  Would the patient like to be referred to a Nutritionist or for Diabetic Management?  No   Diabetic Exams:  Diabetic Eye Exam: Overdue for diabetic eye exam. Pt has been advised about the importance in completing this exam. Patient advised to call and schedule an eye exam. Diabetic Foot Exam: Overdue, Pt has been advised about the importance in completing this exam. Pt is scheduled for diabetic foot exam on next appt .   Interpreter Needed?: No  Information entered by :: Charlott Rakes, LPN   Activities of Daily Living    03/22/2022   11:08 AM  In your present state of health, do you have any difficulty performing the following activities:  Hearing? 0  Vision? 0  Difficulty concentrating or making decisions? 0  Walking or climbing stairs? 0  Dressing or bathing? 0  Doing errands, shopping? 0  Preparing Food and eating ? N  Using the Toilet? N  In the past six months, have you accidently leaked urine? N  Do you have problems with loss of bowel control? N  Managing your Medications? N  Managing your Finances? N  Housekeeping or managing your Housekeeping? N    Patient Care Team: Tawnya Crook, MD as PCP - General (Family Medicine) Marica Otter, OD (Optometry)  Indicate any recent Medical Services you may have received from other than [facility] providers in the past year (date may be approximate).     Assessment:   This is a routine wellness examination for Tyson.  Hearing/Vision screen Hearing  Screening - Comments:: Pt denies any hearing issues  Vision Screening - Comments:: Pt follows up with Dr Marica Otter for annual eye exams   Dietary issues and exercise  activities discussed: Current Exercise Habits: The patient does not participate in regular exercise at present   Goals Addressed             This Visit's Progress    Patient Stated       Stay healthy        Depression Screen    03/22/2022   11:06 AM 08/30/2021    7:29 AM 05/17/2021    7:10 AM 03/11/2021    8:57 AM 11/10/2020    4:43 PM 09/11/2019    8:56 AM 06/11/2019    3:42 PM  PHQ 2/9 Scores  PHQ - 2 Score 0 0 0 0 0 0   PHQ- 9 Score  0 0 0 0 0   Exception Documentation       Patient refusal    Fall Risk    03/22/2022   11:08 AM 08/30/2021    7:28 AM 05/17/2021    7:11 AM 03/11/2021    8:58 AM 11/10/2020    4:43 PM  Fall Risk   Falls in the past year? 0 0 0 0 0  Number falls in past yr: 0 0 0 0 0  Injury with Fall? 0 0 0 0 0  Risk for fall due to : Impaired vision No Fall Risks No Fall Risks    Follow up Falls prevention discussed Falls evaluation completed Falls evaluation completed Falls evaluation completed     [place]:  Any stairs in or around the home? Yes  If so, are there any without handrails? No  Home free of loose throw rugs in walkways, pet beds, electrical cords, etc? Yes  Adequate lighting in your home to reduce risk of falls? Yes   ASSISTIVE DEVICES UTILIZED TO PREVENT FALLS:  Life alert? No  Use of a cane, walker or w/c? No  Grab bars in the bathroom? Yes  Shower chair or bench in shower? No  Elevated toilet seat or a handicapped toilet? No   TIMED UP AND GO:  Was the test performed? No .   Cognitive Function:        03/22/2022   11:09 AM  6CIT Screen  What Year? 0 points  What month? 0 points  What time? 0 points  Count back from 20 0 points  Months in reverse 0 points  Repeat phrase 0 points  Total Score 0 points    Immunizations Immunization History  Administered Date(s) Administered   PFIZER(Purple Top)SARS-COV-2 Vaccination 03/30/2019, 04/20/2019   PPD Test 08/06/2012   Pneumococcal  Conjugate-13 09/08/2014   Pneumococcal Polysaccharide-23 12/03/2010   Tdap 12/03/2010    TDAP status: Due, Education has been provided regarding the importance of this vaccine. Advised may receive this vaccine at local pharmacy or Health Dept. Aware to provide a copy of the vaccination record if obtained from local pharmacy or Health Dept. Verbalized acceptance and understanding.  Flu Vaccine status: Declined, Education has been provided regarding the importance of this vaccine but patient still declined. Advised may receive this vaccine at local pharmacy or Health Dept. Aware to provide a copy of the vaccination record if obtained from local pharmacy or Health Dept. Verbalized acceptance and understanding.  Pneumococcal vaccine status: Up to date  Covid-19 vaccine status: Completed vaccines  Qualifies for Shingles Vaccine? Yes  Zostavax completed No   Shingrix Completed?: No.    Education has been provided regarding the importance of this vaccine. Patient has been advised to call insurance company to determine out of pocket expense if they have not yet received this vaccine. Advised may also receive vaccine at local pharmacy or Health Dept. Verbalized acceptance and understanding.  Screening Tests Health Maintenance  Topic Date Due   Zoster Vaccines- Shingrix (1 of 2) Never done   FOOT EXAM  01/09/2018   COVID-19 Vaccine (3 - Pfizer risk series) 05/18/2019   DTaP/Tdap/Td (2 - Td or Tdap) 12/02/2020   OPHTHALMOLOGY EXAM  10/14/2021   INFLUENZA VACCINE  04/24/2022 (Originally 08/24/2021)   HEMOGLOBIN A1C  05/20/2022   Diabetic kidney evaluation - Urine ACR  08/31/2022   Diabetic kidney evaluation - eGFR measurement  11/19/2022   Medicare Annual Wellness (AWV)  03/23/2023   Pneumonia Vaccine 61+ Years old  Completed   HPV VACCINES  Aged Out   COLONOSCOPY (Pts 45-49yr Insurance coverage will need to be confirmed)  Discontinued    Health Maintenance  Health Maintenance Due  Topic  Date Due   Zoster Vaccines- Shingrix (1 of 2) Never done   FOOT EXAM  01/09/2018   COVID-19 Vaccine (3 - Pfizer risk series) 05/18/2019   DTaP/Tdap/Td (2 - Td or Tdap) 12/02/2020   OPHTHALMOLOGY EXAM  10/14/2021    Colorectal cancer screening: No longer required.    Additional Screening:   Vision Screening: Recommended annual ophthalmology exams for early detection of glaucoma and other disorders of the eye. Is the patient up to date with their annual eye exam?  No  Who is the provider or what is the name of the office in which the patient attends annual eye exams? Dr SMarica Otter If pt is not established with a provider, would they like to be referred to a provider to establish care? No .   Dental Screening: Recommended annual dental exams for proper oral hygiene  Community Resource Referral / Chronic Care Management: CRR required this visit?  No   CCM required this visit?  No      Plan:     I have personally reviewed and noted the following in the patient's chart:   Medical and social history Use of alcohol, tobacco or illicit drugs  Current medications and supplements including opioid prescriptions. Patient is not currently taking opioid prescriptions. Functional ability and status Nutritional status Physical activity Advanced directives List of other physicians Hospitalizations, surgeries, and ER visits in previous 12 months Vitals Screenings to include cognitive, depression, and falls Referrals and appointments  In addition, I have reviewed and discussed with patient certain preventive protocols, quality metrics, and best practice recommendations. A written personalized care plan for preventive services as well as general preventive health recommendations were provided to patient.     TWillette Brace LPN   [id number]  Nurse Notes: none

## 2022-03-22 NOTE — Patient Instructions (Signed)
Rodney [name] , Thank you for taking time to come for your Medicare Wellness Visit. I appreciate your ongoing commitment to your health goals. Please review the following plan we discussed and let me know if I can assist you in the future.   These are the goals we discussed:  Goals      Patient Stated     Stay healthy         This is a list of the screening recommended for you and due dates:  Health Maintenance  Topic Date Due   Zoster (Shingles) Vaccine (1 of 2) Never done   Complete foot exam   01/09/2018   COVID-19 Vaccine (3 - Pfizer risk series) 05/18/2019   DTaP/Tdap/Td vaccine (2 - Td or Tdap) 12/02/2020   Eye exam for diabetics  10/14/2021   Flu Shot  04/24/2022*   Hemoglobin A1C  05/20/2022   Yearly kidney health urinalysis for diabetes  08/31/2022   Yearly kidney function blood test for diabetes  11/19/2022   Medicare Annual Wellness Visit  03/23/2023   Pneumonia Vaccine  Completed   HPV Vaccine  Aged Out   Colon Cancer Screening  Discontinued  *Topic was postponed. The date shown is not the original due date.    Advanced directives: Please bring a copy of your health care power of attorney and living will to the office at your convenience.  Conditions/risks identified: stay healthy   Next appointment: Follow up in one year for your annual wellness visit.   Preventive Care 50 Years and Older, Male  Preventive care refers to lifestyle choices and visits with your health care provider that can promote health and wellness. What does preventive care include? A yearly physical exam. This is also called an annual well check. Dental exams once or twice a year. Routine eye exams. Ask your health care provider how often you should have your eyes checked. Personal lifestyle choices, including: Daily care of your teeth and gums. Regular physical activity. Eating a healthy diet. Avoiding tobacco and drug use. Limiting alcohol use. Practicing safe sex. Taking low doses  of aspirin every day. Taking vitamin and mineral supplements as recommended by your health care provider. What happens during an annual well check? The services and screenings done by your health care provider during your annual well check will depend on your age, overall health, lifestyle risk factors, and family history of disease. Counseling  Your health care provider may ask you questions about your: Alcohol use. Tobacco use. Drug use. Emotional well-being. Home and relationship well-being. Sexual activity. Eating habits. History of falls. Memory and ability to understand (cognition). Work and work Statistician. Screening  You may have the following tests or measurements: Height, weight, and BMI. Blood pressure. Lipid and cholesterol levels. These may be checked every 5 years, or more frequently if you are over 45 years old. Skin check. Lung cancer screening. You may have this screening every year starting at age 97 if you have a 30-pack-year history of smoking and currently smoke or have quit within the past 15 years. Fecal occult blood test (FOBT) of the stool. You may have this test every year starting at age 61. Flexible sigmoidoscopy or colonoscopy. You may have a sigmoidoscopy every 5 years or a colonoscopy every 10 years starting at age 61. Prostate cancer screening. Recommendations will vary depending on your family history and other risks. Hepatitis C blood test. Hepatitis B blood test. Sexually transmitted disease (STD) testing. Diabetes screening. This is done by  checking your blood sugar (glucose) after you have not eaten for a while (fasting). You may have this done every 1-3 years. Abdominal aortic aneurysm (AAA) screening. You may need this if you are a current or former smoker. Osteoporosis. You may be screened starting at age 4 if you are at high risk. Talk with your health care provider about your test results, treatment options, and if necessary, the need for  more tests. Vaccines  Your health care provider may recommend certain vaccines, such as: Influenza vaccine. This is recommended every year. Tetanus, diphtheria, and acellular pertussis (Tdap, Td) vaccine. You may need a Td booster every 10 years. Zoster vaccine. You may need this after age 28. Pneumococcal 13-valent conjugate (PCV13) vaccine. One dose is recommended after age 75. Pneumococcal polysaccharide (PPSV23) vaccine. One dose is recommended after age 35. Talk to your health care provider about which screenings and vaccines you need and how often you need them. This information is not intended to replace advice given to you by your health care provider. Make sure you discuss any questions you have with your health care provider. Document Released: 02/06/2015 Document Revised: 09/30/2015 Document Reviewed: 11/11/2014 Elsevier Interactive Patient Education  2017 [place] Prevention in the Home Falls can cause injuries. They can happen to people of all ages. There are many things you can do to make your home safe and to help prevent falls. What can I do on the outside of my home? Regularly fix the edges of walkways and driveways and fix any cracks. Remove anything that might make you trip as you walk through a door, such as a raised step or threshold. Trim any bushes or trees on the path to your home. Use bright outdoor lighting. Clear any walking paths of anything that might make someone trip, such as rocks or tools. Regularly check to see if handrails are loose or broken. Make sure that both sides of any steps have handrails. Any raised decks and porches should have guardrails on the edges. Have any leaves, snow, or ice cleared regularly. Use sand or salt on walking paths during winter. Clean up any spills in your garage right away. This includes oil or grease spills. What can I do in the bathroom? Use night lights. Install grab bars by the toilet and in the tub and  shower. Do not use towel bars as grab bars. Use non-skid mats or decals in the tub or shower. If you need to sit down in the shower, use a plastic, non-slip stool. Keep the floor dry. Clean up any water that spills on the floor as soon as it happens. Remove soap buildup in the tub or shower regularly. Attach bath mats securely with double-sided non-slip rug tape. Do not have throw rugs and other things on the floor that can make you trip. What can I do in the bedroom? Use night lights. Make sure that you have a light by your bed that is easy to reach. Do not use any sheets or blankets that are too big for your bed. They should not hang down onto the floor. Have a firm chair that has side arms. You can use this for support while you get dressed. Do not have throw rugs and other things on the floor that can make you trip. What can I do in the kitchen? Clean up any spills right away. Avoid walking on wet floors. Keep items that you use a lot in easy-to-reach places. If you need to reach  something above you, use a strong step stool that has a grab bar. Keep electrical cords out of the way. Do not use floor polish or wax that makes floors slippery. If you must use wax, use non-skid floor wax. Do not have throw rugs and other things on the floor that can make you trip. What can I do with my stairs? Do not leave any items on the stairs. Make sure that there are handrails on both sides of the stairs and use them. Fix handrails that are broken or loose. Make sure that handrails are as long as the stairways. Check any carpeting to make sure that it is firmly attached to the stairs. Fix any carpet that is loose or worn. Avoid having throw rugs at the top or bottom of the stairs. If you do have throw rugs, attach them to the floor with carpet tape. Make sure that you have a light switch at the top of the stairs and the bottom of the stairs. If you do not have them, ask someone to add them for  you. What else can I do to help prevent falls? Wear shoes that: Do not have high heels. Have rubber bottoms. Are comfortable and fit you well. Are closed at the toe. Do not wear sandals. If you use a stepladder: Make sure that it is fully opened. Do not climb a closed stepladder. Make sure that both sides of the stepladder are locked into place. Ask someone to hold it for you, if possible. Clearly mark and make sure that you can see: Any grab bars or handrails. First and last steps. Where the edge of each step is. Use tools that help you move around (mobility aids) if they are needed. These include: Canes. Walkers. Scooters. Crutches. Turn on the lights when you go into a dark area. Replace any light bulbs as soon as they burn out. Set up your furniture so you have a clear path. Avoid moving your furniture around. If any of your floors are uneven, fix them. If there are any pets around you, be aware of where they are. Review your medicines with your doctor. Some medicines can make you feel dizzy. This can increase your chance of falling. Ask your doctor what other things that you can do to help prevent falls. This information is not intended to replace advice given to you by your health care provider. Make sure you discuss any questions you have with your health care provider. Document Released: 11/06/2008 Document Revised: 06/18/2015 Document Reviewed: 02/14/2014 Elsevier Interactive Patient Education  2017 [employer].

## 2022-05-20 ENCOUNTER — Ambulatory Visit: Payer: Medicare Other | Admitting: Family Medicine

## 2022-06-30 ENCOUNTER — Encounter: Payer: Self-pay | Admitting: Family Medicine

## 2022-06-30 ENCOUNTER — Ambulatory Visit (INDEPENDENT_AMBULATORY_CARE_PROVIDER_SITE_OTHER): Payer: Medicare Other | Admitting: Family Medicine

## 2022-06-30 VITALS — BP 120/70 | HR 88 | Temp 97.9°F | Resp 16 | Ht 68.0 in | Wt 229.0 lb

## 2022-06-30 DIAGNOSIS — Z7984 Long term (current) use of oral hypoglycemic drugs: Secondary | ICD-10-CM

## 2022-06-30 DIAGNOSIS — E785 Hyperlipidemia, unspecified: Secondary | ICD-10-CM

## 2022-06-30 DIAGNOSIS — E559 Vitamin D deficiency, unspecified: Secondary | ICD-10-CM

## 2022-06-30 DIAGNOSIS — E038 Other specified hypothyroidism: Secondary | ICD-10-CM | POA: Diagnosis not present

## 2022-06-30 DIAGNOSIS — E1122 Type 2 diabetes mellitus with diabetic chronic kidney disease: Secondary | ICD-10-CM

## 2022-06-30 DIAGNOSIS — I10 Essential (primary) hypertension: Secondary | ICD-10-CM

## 2022-06-30 DIAGNOSIS — Z0000 Encounter for general adult medical examination without abnormal findings: Secondary | ICD-10-CM

## 2022-06-30 DIAGNOSIS — N1831 Chronic kidney disease, stage 3a: Secondary | ICD-10-CM

## 2022-06-30 LAB — CBC WITH DIFFERENTIAL/PLATELET
Basophils Absolute: 0 10*3/uL (ref 0.0–0.1)
Basophils Relative: 0.8 % (ref 0.0–3.0)
Eosinophils Absolute: 0.1 10*3/uL (ref 0.0–0.7)
Eosinophils Relative: 2.7 % (ref 0.0–5.0)
HCT: 37.4 % — ABNORMAL LOW (ref 39.0–52.0)
Hemoglobin: 12.4 g/dL — ABNORMAL LOW (ref 13.0–17.0)
Lymphocytes Relative: 11.6 % — ABNORMAL LOW (ref 12.0–46.0)
Lymphs Abs: 0.6 10*3/uL — ABNORMAL LOW (ref 0.7–4.0)
MCHC: 33.2 g/dL (ref 30.0–36.0)
MCV: 99.3 fl (ref 78.0–100.0)
Monocytes Absolute: 0.5 10*3/uL (ref 0.1–1.0)
Monocytes Relative: 8.6 % (ref 3.0–12.0)
Neutro Abs: 4 10*3/uL (ref 1.4–7.7)
Neutrophils Relative %: 76.3 % (ref 43.0–77.0)
Platelets: 263 10*3/uL (ref 150.0–400.0)
RBC: 3.76 Mil/uL — ABNORMAL LOW (ref 4.22–5.81)
RDW: 13.9 % (ref 11.5–15.5)
WBC: 5.2 10*3/uL (ref 4.0–10.5)

## 2022-06-30 LAB — COMPREHENSIVE METABOLIC PANEL
ALT: 22 U/L (ref 0–53)
AST: 17 U/L (ref 0–37)
Albumin: 4.3 g/dL (ref 3.5–5.2)
Alkaline Phosphatase: 87 U/L (ref 39–117)
BUN: 24 mg/dL — ABNORMAL HIGH (ref 6–23)
CO2: 25 mEq/L (ref 19–32)
Calcium: 9.2 mg/dL (ref 8.4–10.5)
Chloride: 105 mEq/L (ref 96–112)
Creatinine, Ser: 1.63 mg/dL — ABNORMAL HIGH (ref 0.40–1.50)
GFR: 39.12 mL/min — ABNORMAL LOW (ref >60.00)
Glucose, Bld: 187 mg/dL — ABNORMAL HIGH (ref 70–99)
Potassium: 4.8 mEq/L (ref 3.5–5.1)
Sodium: 140 mEq/L (ref 135–145)
Total Bilirubin: 0.8 mg/dL (ref 0.2–1.2)
Total Protein: 6.7 g/dL (ref 6.0–8.3)

## 2022-06-30 LAB — LIPID PANEL
Cholesterol: 121 mg/dL (ref 0–200)
HDL: 27.2 mg/dL — ABNORMAL LOW (ref >39.00)
LDL Cholesterol: 64 mg/dL (ref 0–99)
NonHDL: 93.65
Total CHOL/HDL Ratio: 4
Triglycerides: 150 mg/dL — ABNORMAL HIGH (ref 0.0–149.0)
VLDL: 30 mg/dL (ref 0.0–40.0)

## 2022-06-30 LAB — HEMOGLOBIN A1C: Hgb A1c MFr Bld: 7.8 % — ABNORMAL HIGH (ref 4.6–6.5)

## 2022-06-30 LAB — VITAMIN D 25 HYDROXY (VIT D DEFICIENCY, FRACTURES): VITD: 25.26 ng/mL — ABNORMAL LOW (ref 30.00–100.00)

## 2022-06-30 LAB — TSH: TSH: 3.34 u[IU]/mL (ref 0.35–5.50)

## 2022-06-30 MED ORDER — ATORVASTATIN CALCIUM 20 MG PO TABS: ORAL_TABLET | ORAL | 3 refills | Status: DC

## 2022-06-30 NOTE — Patient Instructions (Signed)

## 2022-06-30 NOTE — Assessment & Plan Note (Signed)
Chronic.  Not taking medications.  Monitor

## 2022-06-30 NOTE — Assessment & Plan Note (Signed)
Chronic.  Does have some CKD.  Not on supplementation.  Check vitamin D

## 2022-06-30 NOTE — Assessment & Plan Note (Addendum)
Chronic.  Controlled.  Continue glipizide 5 mg daily.  Work on diet/exercise.  Patient is on ARB, statin.  Advised to schedule appointment with ophthalmologist

## 2022-06-30 NOTE — Progress Notes (Signed)
Phone: [phone]   Subjective:  Patient 82 y.o. male presenting for annual physical.  Chief Complaint  Patient presents with   Hypertension    Here for follow up   Diabetes    Here for follow up  Annual-no major complaints.  Patient's mother just passed away at age 77.  He has been traveling back and forth to [place] to take care of her, settle the estate, etc. !.  Hypertension-on amlodipine and losartan.  No chest pain, palpitations.  Edema if stands all day 2.  DM-doesn't check.  Needs to see ophth.  Taking glipizide 5 mg daily. 3.  HLD-on atorvastatin 20 mg daily.  Tried to fill it recently at the pharmacy, pharmacy told insurance won't pay for Lipitor.   See problem oriented charting- ROS- ROS: Gen: no fever, chills  Skin: no rash, itching ENT: no ear pain, ear drainage, nasal congestion, rhinorrhea, sinus pressure, sore throat Eyes: no blurry vision, double vision Resp: no cough, wheeze old dyspnea on exertion has had w/u CV: no CP, palpitations, GI: no heartburn, n/v/d/c, abd pain GU: no dysuria, urgency, frequency, hematuria MSK: back pain if active Neuro: no dizziness, headache, weakness, vertigo Psych: no depression, anxiety, insomnia, SI   The following were reviewed and entered/updated in epic: Past Medical History:  Diagnosis Date   Bladder calculus    CAD (coronary artery disease)    non obs    CKD (chronic kidney disease), stage III (HCC)    " i have 35-40% of my kidney function"; mgd by pcp    COLONIC POLYPS, HX OF 12/02/2008   DIABETES MELLITUS, TYPE II 07/19/2007   Dyspnea    reports sob over the last 2 years , underwent cath  in feb 2020 , reports cath was clean , referred to pulmonologist that stopped his Actos and encouraged weight loss  ; reports today minimum improvement in breathing , appt with pulm in June 2020   Erectile dysfunction    GERD (gastroesophageal reflux disease)    Glaucoma    GOUT 07/19/2007   History of kidney stones     HYPERLIPIDEMIA 12/02/2008   HYPERTENSION 07/19/2007   Kidney stones    Prostate cancer (HCC) 2007   Reiter syndrome    incomplete   TIA (transient ischemic attack)    Tubular adenoma of colon    Vitamin D deficiency    Patient Active Problem List   Diagnosis Date Noted   TIA (transient ischemic attack) 03/04/2021   Vitamin D deficiency 02/15/2021   Incontinence when straining, male 05/21/2019   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/01/2018   Morbid (severe) obesity due to excess calories (HCC) complicated by hbp/dm 05/23/2018   Dyspnea on exertion    Ankle fracture 05/16/2015   Malignant neoplasm of prostate (HCC) 11/08/2013   Prostate cancer (HCC) 03/29/2012   Nephrolithiasis 03/29/2012   Chronic kidney disease (CKD), stage III (moderate) (HCC) 12/03/2010   Dyslipidemia 12/02/2008   History of colonic polyps 12/02/2008   Type 2 diabetes mellitus with stage 3 chronic kidney disease (HCC) 07/19/2007   Gout 07/19/2007   Essential hypertension 07/19/2007   Past Surgical History:  Procedure Laterality Date   bladder neck dilation     CATARACT EXTRACTION     both eyes- 2015   COLONOSCOPY  11/15/2021   12-11-14-polyps   CYSTOSCOPY N/A 06/11/2018   Procedure: CYSTOSCOPY WITH REMOVAL OF BLADDER CALCULUS;  Surgeon: Heloise Purpura, MD;  Location: [facility];  Service: Urology;  Laterality: N/A;   ELBOW SURGERY  bone spur - right   LEFT HEART CATH AND CORONARY ANGIOGRAPHY N/A 03/19/2018   Procedure: LEFT HEART CATH AND CORONARY ANGIOGRAPHY;  Surgeon: Lyn Records, MD;  Location: [facility];  Service: Cardiovascular;  Laterality: N/A;   ORIF ANKLE FRACTURE Left 05/17/2015   Procedure: OPEN REDUCTION INTERNAL FIXATION (ORIF) ANKLE FRACTURE;  Surgeon: Cammy Copa, MD;  Location: [facility];  Service: Orthopedics;  Laterality: Left;   PROSTATECTOMY  2007   URINARY SPHINCTER IMPLANT N/A 05/21/2019   Procedure: IMPLANTATION OF ARTIFICIAL URINARY SPHINCTER CYSTOSCOPY;  Surgeon:  Alfredo Martinez, MD;  Location: [facility];  Service: Urology;  Laterality: N/A;   VASECTOMY      Family History  Problem Relation Age of Onset   Cancer Sister        metastatic colon ca   Colon cancer Neg Hx    Esophageal cancer Neg Hx    Rectal cancer Neg Hx    Stomach cancer Neg Hx    Lung disease Neg Hx    Colon polyps Neg Hx    Pancreatic cancer Neg Hx     Medications- reviewed and updated Current Outpatient Medications  Medication Sig Dispense Refill   amLODipine (NORVASC) 5 MG tablet Take 1 tablet (5 mg total) by mouth 2 (two) times daily. 180 tablet 3   aspirin 81 MG chewable tablet Chew 1 tablet (81 mg total) by mouth daily. 30 tablet 1   dorzolamide-timolol (COSOPT) 22.3-6.8 MG/ML ophthalmic solution Place 1 drop into both eyes every morning.     glipiZIDE (GLUCOTROL) 5 MG tablet Take 1 tablet (5 mg total) by mouth daily before breakfast. 90 tablet 3   glucose blood (ACCU-CHEK AVIVA) test strip 1 each by Other route 2 (two) times daily. 100 each 3   losartan (COZAAR) 50 MG tablet Take 1 tablet (50 mg total) by mouth at bedtime. 90 tablet 3   atorvastatin (LIPITOR) 20 MG tablet TAKE 1 TABLET BY MOUTH AT BEDTIME . APPOINTMENT REQUIRED FOR FUTURE REFILLS 90 tablet 3   No current facility-administered medications for this visit.    Allergies-reviewed and updated Allergies  Allergen Reactions   Ace Inhibitors Swelling    Tongue swelling. Reaction to benazepril only.     Social History   Social History Narrative   6 grands 2 greats      Was competitive fisherman.   Former Holiday representative   Objective  Objective:  BP 120/70 (BP Location: Right Arm, Patient Position: Sitting, Cuff Size: Small)   Pulse 88   Temp 97.9 F (36.6 C) (Temporal)   Resp 16   Ht 5\' 8"  (1.727 m)   Wt 229 lb (103.9 kg)   SpO2 99%   BMI 34.82 kg/m  Physical Exam  Gen: WDWN NAD HEENT: NCAT, conjunctiva not injected, sclera nonicteric TM WNL B, OP moist, no exudates  NECK:  supple, no  thyromegaly, no nodes, no carotid bruits CARDIAC: RRR, S1S2+, no murmur. DP 1+B LUNGS: CTAB. No wheezes ABDOMEN:  BS+, soft, NTND, No HSM, no masses EXT:  1+edema MSK: no gross abnormalities. MS 5/5 all 4 NEURO: A&O x3.  CN II-XII intact.  PSYCH: normal mood. Good eye contact   Diabetic Foot Exam - Simple   Simple Foot Form Diabetic Foot exam was performed with the following findings: Yes 06/30/2022 11:04 AM  Visual Inspection No deformities, no ulcerations, no other skin breakdown bilaterally: Yes Sensation Testing See comments: Yes Pulse Check Posterior Tibialis and Dorsalis pulse intact bilaterally: Yes Comments Sens  intact except right great toe-scar and injury in past        Assessment and Plan   Health Maintenance counseling: 1. Anticipatory guidance: Patient counseled regarding regular dental exams q6 months, eye exams yearly, avoiding smoking and second hand smoke, limiting alcohol to 2 beverages per day.   2. Risk factor reduction:  Advised patient of need for regular exercise and diet rich in fruits and vegetables to reduce risk of heart attack and stroke. Exercise- encouraged.   Wt Readings from Last 3 Encounters:  06/30/22 229 lb (103.9 kg)  03/22/22 223 lb (101.2 kg)  11/18/21 223 lb 3.2 oz (101.2 kg)   3. Immunizations/screenings/ancillary studies Immunization History  Administered Date(s) Administered   PFIZER(Purple Top)SARS-COV-2 Vaccination 03/30/2019, 04/20/2019   PPD Test 08/06/2012   Pneumococcal Conjugate-13 09/08/2014   Pneumococcal Polysaccharide-23 12/03/2010   Tdap 12/03/2010   Health Maintenance Due  Topic Date Due   DTaP/Tdap/Td (2 - Td or Tdap) 12/02/2020   OPHTHALMOLOGY EXAM  10/14/2021    4. Prostate cancer screening >55yo - risk factors?  Lab Results  Component Value Date   PSA 26.90 10/13/2021   PSA 19.6 03/11/2021   PSA 17.4 07/22/2020    5. Colon cancer screening:aged out 6. Wellness examination  Type 2 diabetes mellitus with  stage 3a chronic kidney disease, without long-term current use of insulin (HCC) Assessment & Plan: Chronic.  Controlled.  Continue glipizide 5 mg daily.  Work on diet/exercise.  Patient is on ARB, statin.  Advised to schedule appointment with ophthalmologist  Orders: -     Comprehensive metabolic panel -     Hemoglobin A1c  Essential hypertension Assessment & Plan: Chronic.  Controlled.  Continue amlodipine 5 mg daily, losartan 50 mg daily.  For edema, wear compression stockings and elevate legs  Orders: -     CBC with Differential/Platelet  Dyslipidemia Assessment & Plan: Chronic.  Controlled.  Continue Lipitor 20 mg daily.  Advised to talk to the pharmacy to straighten out why they are not covering the medicine.  He does need it  Orders: -     Atorvastatin Calcium; TAKE 1 TABLET BY MOUTH AT BEDTIME . APPOINTMENT REQUIRED FOR FUTURE REFILLS  Dispense: 90 tablet; Refill: 3 -     Lipid panel -     Comprehensive metabolic panel  Subclinical hypothyroidism Assessment & Plan: Chronic.  Not taking medications.  Monitor  Orders: -     TSH  Vitamin D deficiency Assessment & Plan: Chronic.  Does have some CKD.  Not on supplementation.  Check vitamin D  Orders: -     VITAMIN D 25 Hydroxy (Vit-D Deficiency, Fractures)   Wellness-antic guidance   Recommended follow up: Return in about 6 months (around 12/30/2022) for chronic follow-up.  Lab/Order associations:not fasting   Angelena Sole, MD

## 2022-06-30 NOTE — Assessment & Plan Note (Signed)
Chronic.  Controlled.  Continue amlodipine 5 mg daily, losartan 50 mg daily.  For edema, wear compression stockings and elevate legs

## 2022-06-30 NOTE — Assessment & Plan Note (Signed)
Chronic.  Controlled.  Continue Lipitor 20 mg daily.  Advised to talk to the pharmacy to straighten out why they are not covering the medicine.  He does need it

## 2022-07-03 NOTE — Progress Notes (Signed)
1.  Mild anemia-is he having any dark stools, bleeding?  Repeat CBC differential, B12, iron studies in 1 month 2.  Vitamin D is a little bit low-take vitamin D 2000 IUs/day over-the-counter 3.  Kidney function is slightly worse.  We need to get the sugars under control. 4.  Although the A1c is less than 8 (which is goal for your age) they could be a little bit better.  Get back on track with the diet/exercise.  Also, given the kidney function, suggest using Farxiga 10 mg daily (assuming the insurance covers this).  Recheck BMP in 1 month with CBC

## 2022-07-04 ENCOUNTER — Other Ambulatory Visit: Payer: Self-pay | Admitting: *Deleted

## 2022-07-04 DIAGNOSIS — E538 Deficiency of other specified B group vitamins: Secondary | ICD-10-CM

## 2022-07-04 DIAGNOSIS — D649 Anemia, unspecified: Secondary | ICD-10-CM

## 2022-07-06 IMAGING — MR MR HEAD W/O CM
6 of 11 series · 26 of 48 positions shown · non-contrast
Comparison: Same day CTA

CLINICAL DATA: Neuro deficit, acute, stroke suspected

EXAM:
MRI HEAD WITHOUT CONTRAST
TECHNIQUE: Multiplanar, multiecho pulse sequences of the brain and surrounding
structures were obtained without intravenous contrast.

[Series 2: DWI · axial · 3.0mm · 0.94mm/px · z∈[-107,+58]mm · 8 of 114 slices shown (1 of 2)]
[im 1/114]
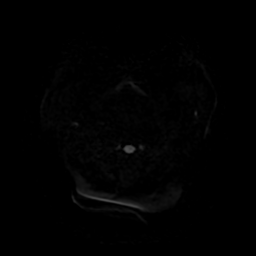
[im 17/114]
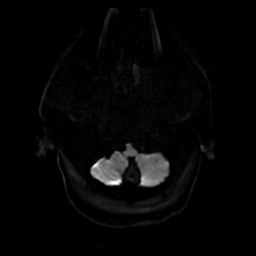
[im 33/114]
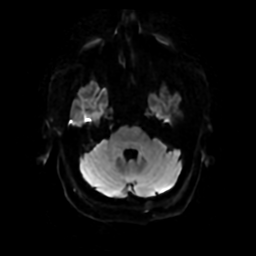
[im 49/114]
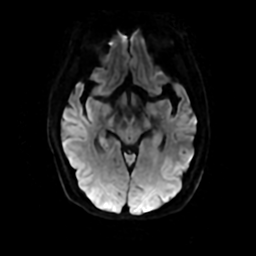
[im 65/114]
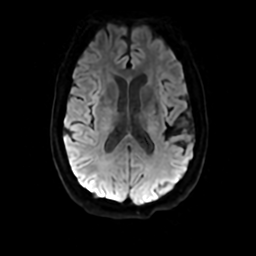
[im 81/114]
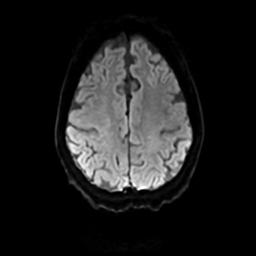
[im 97/114]
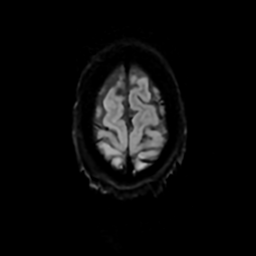
[im 114/114]
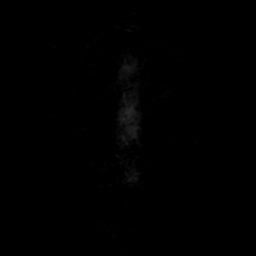

[Series 3: DWI · coronal · 4.0mm · 0.94mm/px · 6 of 78 slices shown (2 of 2)]
[im 1/78]
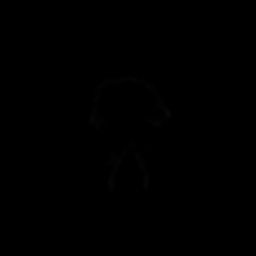
[im 16/78]
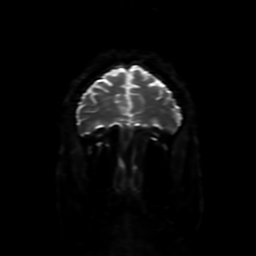
[im 31/78]
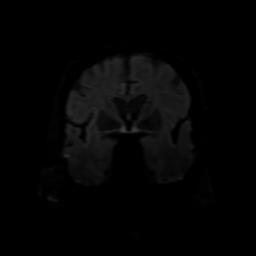
[im 47/78]
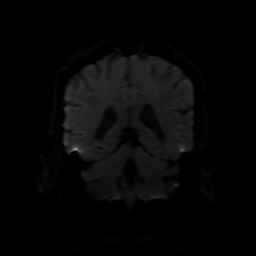
[im 62/78]
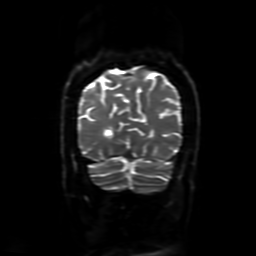
[im 78/78]
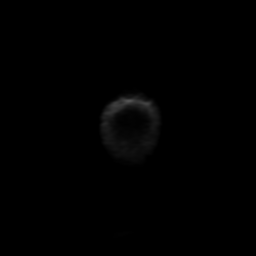

[Series 4: FLAIR · sagittal · 5.0mm · 0.23mm/px · 2 of 25 slices shown (1 of 2)]
[im 1/25]
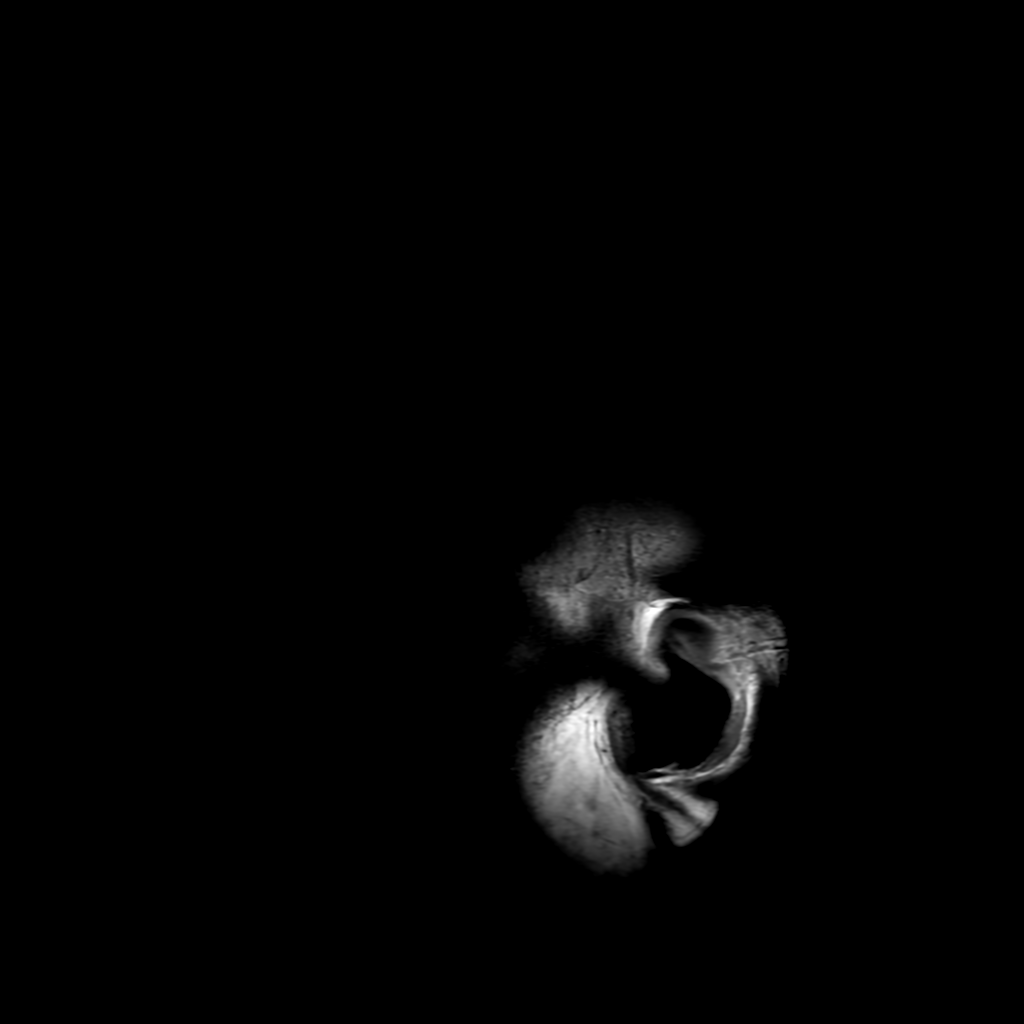
[im 25/25]
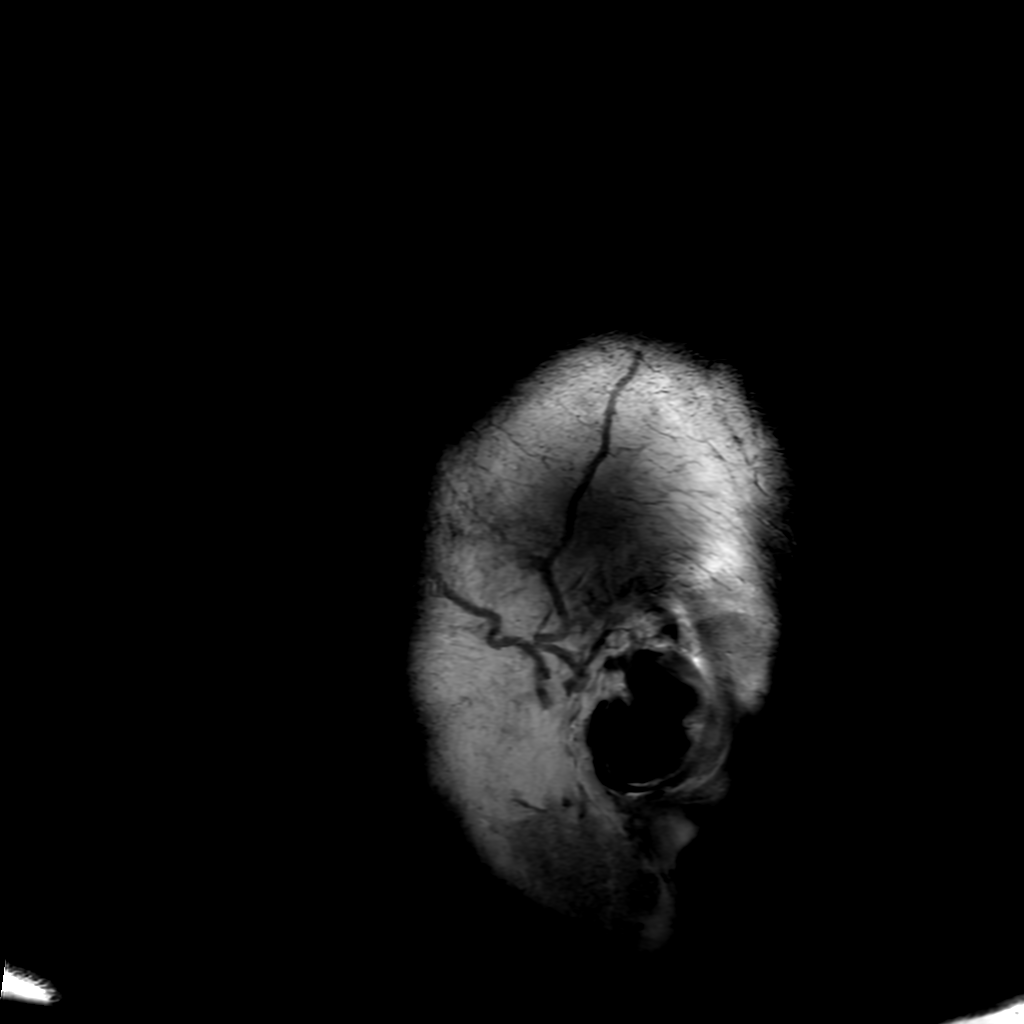

[Series 6: FLAIR · axial · 4.0mm · 0.45mm/px · z∈[-102,+52]mm · 3 of 37 slices shown (2 of 2)]
[im 1/37]
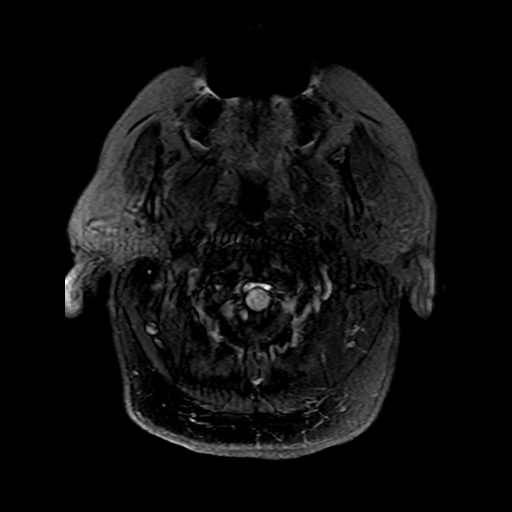
[im 19/37]
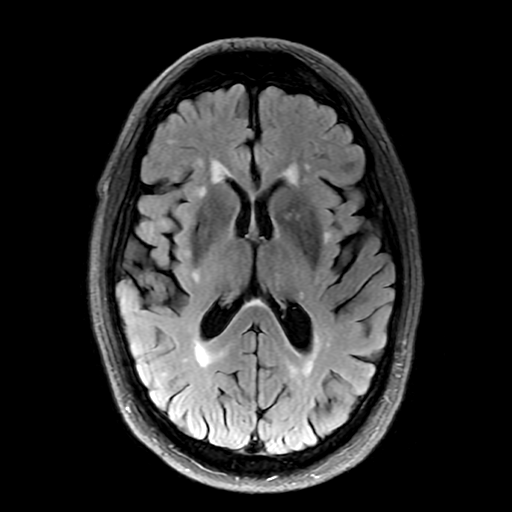
[im 37/37]
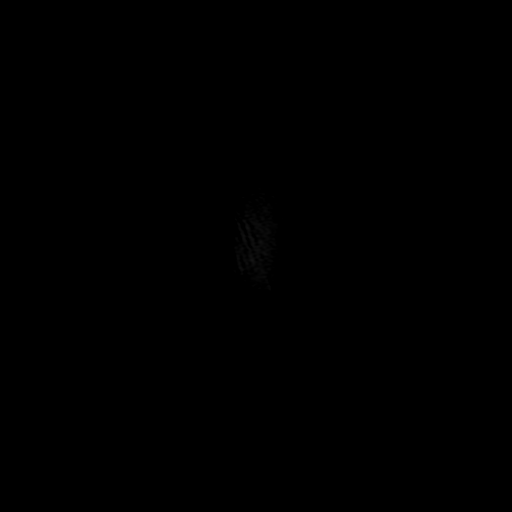

[Series 250: ADC · axial · 3.0mm · 0.94mm/px · z∈[-107,+58]mm · 4 of 57 slices shown (1 of 2)]
[im 1/57]
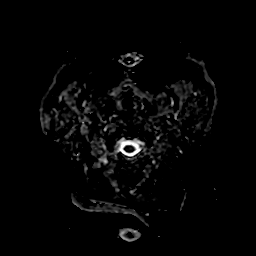
[im 19/57]
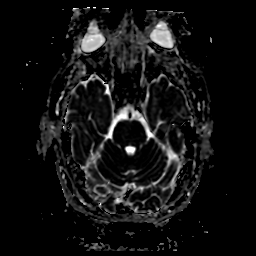
[im 38/57]
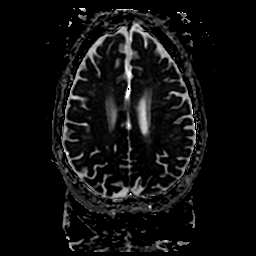
[im 57/57]
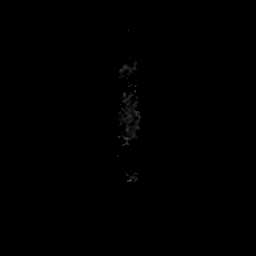

[Series 350: ADC · coronal · 4.0mm · 0.94mm/px · 3 of 39 slices shown (2 of 2)]
[im 1/39]
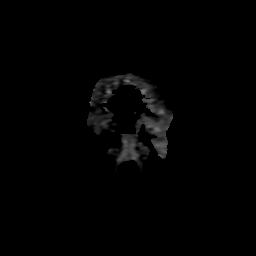
[im 20/39]
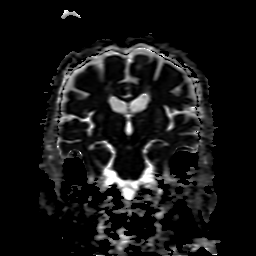
[im 39/39]
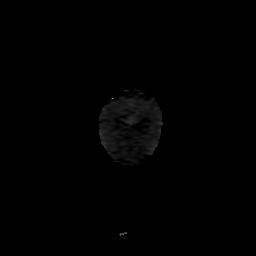

[26 of 48 positions shown; findings below may reference images not displayed]

FINDINGS: Brain: No acute infarction, hemorrhage, hydrocephalus, extra-axial
collection or mass lesion. Mild to moderate scattered T2/FLAIR
hyperintensities in the white matter, nonspecific but compatible
with chronic microvascular disease. Small remote lacunar infarcts in
the right corona radiata and right basal ganglia.

Vascular: Major arterial flow voids are maintained at the skull
base.

Skull and upper cervical spine: Normal marrow signal.

Sinuses/Orbits: Mild paranasal sinus mucosal thickening.
Unremarkable orbits.

Other: No sizable mastoid effusions
IMPRESSION: 1. No evidence of acute intracranial abnormality.
2. Small remote lacunar infarcts and mild-to-moderate chronic
microvascular disease.

## 2022-07-25 ENCOUNTER — Other Ambulatory Visit (INDEPENDENT_AMBULATORY_CARE_PROVIDER_SITE_OTHER): Payer: Medicare Other

## 2022-07-25 DIAGNOSIS — E538 Deficiency of other specified B group vitamins: Secondary | ICD-10-CM

## 2022-07-25 DIAGNOSIS — D649 Anemia, unspecified: Secondary | ICD-10-CM

## 2022-07-25 LAB — CBC WITH DIFFERENTIAL/PLATELET
Basophils Absolute: 0 10*3/uL (ref 0.0–0.1)
Basophils Relative: 0.6 % (ref 0.0–3.0)
Eosinophils Absolute: 0.1 10*3/uL (ref 0.0–0.7)
Eosinophils Relative: 2.3 % (ref 0.0–5.0)
HCT: 36.6 % — ABNORMAL LOW (ref 39.0–52.0)
Hemoglobin: 12.2 g/dL — ABNORMAL LOW (ref 13.0–17.0)
Lymphocytes Relative: 13.8 % (ref 12.0–46.0)
Lymphs Abs: 0.7 10*3/uL (ref 0.7–4.0)
MCHC: 33.5 g/dL (ref 30.0–36.0)
MCV: 99.1 fl (ref 78.0–100.0)
Monocytes Absolute: 0.4 10*3/uL (ref 0.1–1.0)
Monocytes Relative: 7.1 % (ref 3.0–12.0)
Neutro Abs: 3.9 10*3/uL (ref 1.4–7.7)
Neutrophils Relative %: 76.2 % (ref 43.0–77.0)
Platelets: 253 10*3/uL (ref 150.0–400.0)
RBC: 3.69 Mil/uL — ABNORMAL LOW (ref 4.22–5.81)
RDW: 13.7 % (ref 11.5–15.5)
WBC: 5.1 10*3/uL (ref 4.0–10.5)

## 2022-07-25 LAB — BASIC METABOLIC PANEL
BUN: 17 mg/dL (ref 6–23)
CO2: 26 mEq/L (ref 19–32)
Calcium: 9.6 mg/dL (ref 8.4–10.5)
Chloride: 104 mEq/L (ref 96–112)
Creatinine, Ser: 1.57 mg/dL — ABNORMAL HIGH (ref 0.40–1.50)
GFR: 40.9 mL/min — ABNORMAL LOW (ref >60.00)
Glucose, Bld: 202 mg/dL — ABNORMAL HIGH (ref 70–99)
Potassium: 4.5 mEq/L (ref 3.5–5.1)
Sodium: 139 mEq/L (ref 135–145)

## 2022-07-25 LAB — VITAMIN B12: Vitamin B-12: 367 pg/mL (ref 211–911)

## 2022-07-26 LAB — IRON,TIBC AND FERRITIN PANEL
%SAT: 28 % (calc) (ref 20–48)
Ferritin: 105 ng/mL (ref 24–380)
Iron: 74 ug/dL (ref 50–180)
TIBC: 268 mcg/dL (calc) (ref 250–425)

## 2022-07-28 NOTE — Progress Notes (Signed)
Labs are stable.  I'm a little concerned about the hemoglobin.  Need to make sure not dropping too much.  Repeat cbcd 2-3 months.  Work on diet for sugars

## 2022-07-29 ENCOUNTER — Other Ambulatory Visit: Payer: Self-pay | Admitting: *Deleted

## 2022-07-29 DIAGNOSIS — D649 Anemia, unspecified: Secondary | ICD-10-CM

## 2022-08-22 ENCOUNTER — Other Ambulatory Visit: Payer: Self-pay | Admitting: Internal Medicine

## 2022-08-22 DIAGNOSIS — E1122 Type 2 diabetes mellitus with diabetic chronic kidney disease: Secondary | ICD-10-CM

## 2022-09-16 DIAGNOSIS — C7951 Secondary malignant neoplasm of bone: Secondary | ICD-10-CM | POA: Diagnosis not present

## 2022-09-29 ENCOUNTER — Other Ambulatory Visit (HOSPITAL_COMMUNITY): Payer: Self-pay | Admitting: Urology

## 2022-09-29 DIAGNOSIS — C61 Malignant neoplasm of prostate: Secondary | ICD-10-CM

## 2022-10-12 ENCOUNTER — Encounter (HOSPITAL_COMMUNITY)
Admission: RE | Admit: 2022-10-12 | Discharge: 2022-10-12 | Disposition: A | Discharge status: Home / Self Care | Payer: Medicare Other | Source: Ambulatory Visit | Attending: Urology | Admitting: Urology

## 2022-10-12 DIAGNOSIS — C7951 Secondary malignant neoplasm of bone: Secondary | ICD-10-CM | POA: Diagnosis not present

## 2022-10-12 DIAGNOSIS — C61 Malignant neoplasm of prostate: Secondary | ICD-10-CM | POA: Diagnosis present

## 2022-10-12 MED ORDER — FLOTUFOLASTAT F 18 GALLIUM 296-5846 MBQ/ML IV SOLN
8.4000 | Freq: Once | INTRAVENOUS | Status: AC
  Administered 2022-10-12: 8.4 via INTRAVENOUS

## 2022-10-27 ENCOUNTER — Other Ambulatory Visit (INDEPENDENT_AMBULATORY_CARE_PROVIDER_SITE_OTHER): Payer: Medicare Other

## 2022-10-27 ENCOUNTER — Other Ambulatory Visit: Payer: Medicare Other

## 2022-10-27 DIAGNOSIS — D649 Anemia, unspecified: Secondary | ICD-10-CM | POA: Diagnosis not present

## 2022-10-27 LAB — CBC WITH DIFFERENTIAL/PLATELET
Basophils Absolute: 0.1 10*3/uL (ref 0.0–0.1)
Basophils Relative: 0.8 % (ref 0.0–3.0)
Eosinophils Absolute: 0.2 10*3/uL (ref 0.0–0.7)
Eosinophils Relative: 2.6 % (ref 0.0–5.0)
HCT: 36.5 % — ABNORMAL LOW (ref 39.0–52.0)
Hemoglobin: 12.5 g/dL — ABNORMAL LOW (ref 13.0–17.0)
Lymphocytes Relative: 12.3 % (ref 12.0–46.0)
Lymphs Abs: 0.8 10*3/uL (ref 0.7–4.0)
MCHC: 34.3 g/dL (ref 30.0–36.0)
MCV: 99.4 fL (ref 78.0–100.0)
Monocytes Absolute: 0.5 10*3/uL (ref 0.1–1.0)
Monocytes Relative: 8.3 % (ref 3.0–12.0)
Neutro Abs: 5 10*3/uL (ref 1.4–7.7)
Neutrophils Relative %: 76 % (ref 43.0–77.0)
Platelets: 253 10*3/uL (ref 150.0–400.0)
RBC: 3.68 Mil/uL — ABNORMAL LOW (ref 4.22–5.81)
RDW: 13.7 % (ref 11.5–15.5)
WBC: 6.6 10*3/uL (ref 4.0–10.5)

## 2022-10-30 NOTE — Progress Notes (Signed)
Anemia is stable.  Suspect from kidneys/age/chronic disease.  Will reck again when here in December but if getting weak, tired, etc, let us know

## 2022-11-01 DIAGNOSIS — C7951 Secondary malignant neoplasm of bone: Secondary | ICD-10-CM | POA: Diagnosis not present

## 2022-11-21 ENCOUNTER — Other Ambulatory Visit: Payer: Self-pay | Admitting: Family Medicine

## 2022-11-21 DIAGNOSIS — N1832 Chronic kidney disease, stage 3b: Secondary | ICD-10-CM

## 2022-12-12 ENCOUNTER — Other Ambulatory Visit: Payer: Self-pay | Admitting: Family Medicine

## 2022-12-12 DIAGNOSIS — I10 Essential (primary) hypertension: Secondary | ICD-10-CM

## 2022-12-30 ENCOUNTER — Ambulatory Visit (INDEPENDENT_AMBULATORY_CARE_PROVIDER_SITE_OTHER): Payer: Medicare Other | Admitting: Family Medicine

## 2022-12-30 ENCOUNTER — Encounter: Payer: Self-pay | Admitting: Family Medicine

## 2022-12-30 VITALS — BP 120/60 | HR 74 | Temp 98.3°F | Resp 18 | Ht 63.0 in | Wt 233.0 lb

## 2022-12-30 DIAGNOSIS — E1122 Type 2 diabetes mellitus with diabetic chronic kidney disease: Secondary | ICD-10-CM | POA: Diagnosis not present

## 2022-12-30 DIAGNOSIS — E785 Hyperlipidemia, unspecified: Secondary | ICD-10-CM | POA: Diagnosis not present

## 2022-12-30 DIAGNOSIS — C61 Malignant neoplasm of prostate: Secondary | ICD-10-CM

## 2022-12-30 DIAGNOSIS — I10 Essential (primary) hypertension: Secondary | ICD-10-CM | POA: Diagnosis not present

## 2022-12-30 DIAGNOSIS — Z7984 Long term (current) use of oral hypoglycemic drugs: Secondary | ICD-10-CM

## 2022-12-30 DIAGNOSIS — N1832 Chronic kidney disease, stage 3b: Secondary | ICD-10-CM | POA: Diagnosis not present

## 2022-12-30 LAB — COMPREHENSIVE METABOLIC PANEL
ALT: 21 U/L (ref 0–53)
AST: 15 U/L (ref 0–37)
Albumin: 4.4 g/dL (ref 3.5–5.2)
Alkaline Phosphatase: 97 U/L (ref 39–117)
BUN: 26 mg/dL — ABNORMAL HIGH (ref 6–23)
CO2: 24 meq/L (ref 19–32)
Calcium: 9.2 mg/dL (ref 8.4–10.5)
Chloride: 105 meq/L (ref 96–112)
Creatinine, Ser: 1.58 mg/dL — ABNORMAL HIGH (ref 0.40–1.50)
GFR: 40.47 mL/min — ABNORMAL LOW (ref >60.00)
Glucose, Bld: 234 mg/dL — ABNORMAL HIGH (ref 70–99)
Potassium: 4.1 meq/L (ref 3.5–5.1)
Sodium: 137 meq/L (ref 135–145)
Total Bilirubin: 0.7 mg/dL (ref 0.2–1.2)
Total Protein: 7.2 g/dL (ref 6.0–8.3)

## 2022-12-30 LAB — CBC WITH DIFFERENTIAL/PLATELET
Basophils Absolute: 0.1 10*3/uL (ref 0.0–0.1)
Basophils Relative: 0.9 % (ref 0.0–3.0)
Eosinophils Absolute: 0.1 10*3/uL (ref 0.0–0.7)
Eosinophils Relative: 2.5 % (ref 0.0–5.0)
HCT: 40.1 % (ref 39.0–52.0)
Hemoglobin: 14.1 g/dL (ref 13.0–17.0)
Lymphocytes Relative: 13.1 % (ref 12.0–46.0)
Lymphs Abs: 0.7 10*3/uL (ref 0.7–4.0)
MCHC: 35.2 g/dL (ref 30.0–36.0)
MCV: 100 fL (ref 78.0–100.0)
Monocytes Absolute: 0.5 10*3/uL (ref 0.1–1.0)
Monocytes Relative: 9.3 % (ref 3.0–12.0)
Neutro Abs: 4.1 10*3/uL (ref 1.4–7.7)
Neutrophils Relative %: 74.2 % (ref 43.0–77.0)
Platelets: 239 10*3/uL (ref 150.0–400.0)
RBC: 4.01 Mil/uL — ABNORMAL LOW (ref 4.22–5.81)
RDW: 14 % (ref 11.5–15.5)
WBC: 5.6 10*3/uL (ref 4.0–10.5)

## 2022-12-30 LAB — VITAMIN B12: Vitamin B-12: 363 pg/mL (ref 211–911)

## 2022-12-30 LAB — HEMOGLOBIN A1C: Hgb A1c MFr Bld: 8.1 % — ABNORMAL HIGH (ref 4.6–6.5)

## 2022-12-30 MED ORDER — LOSARTAN POTASSIUM 50 MG PO TABS: 50.0000 mg | ORAL_TABLET | Freq: Every day | ORAL | 1 refills | Status: DC

## 2022-12-30 MED ORDER — GLIPIZIDE 5 MG PO TABS: 5.0000 mg | ORAL_TABLET | Freq: Every day | ORAL | 1 refills | Status: DC

## 2022-12-30 MED ORDER — AMLODIPINE BESYLATE 5 MG PO TABS: 5.0000 mg | ORAL_TABLET | Freq: Two times a day (BID) | ORAL | 1 refills | Status: DC

## 2022-12-30 NOTE — Assessment & Plan Note (Signed)
Chronic.  Controlled.  Continue amlodipine 5 mg daily, losartan 50 mg daily.  For edema, wear compression stockings and elevate legs

## 2022-12-30 NOTE — Assessment & Plan Note (Signed)
Chronic.  Controlled.  Continue glipizide 5 mg daily.  Work on diet/exercise.  Patient is on ARB, statin.  Advised to schedule appointment with ophthalmologist

## 2022-12-30 NOTE — Patient Instructions (Signed)

## 2022-12-30 NOTE — Assessment & Plan Note (Signed)
Chronic.  Controlled.  Continue Lipitor 20 mg daily

## 2022-12-30 NOTE — Progress Notes (Signed)
Phone: [phone]   Subjective:  Patient 82 y.o. Rodney [name] presenting for annual physical.  Chief Complaint  Patient presents with   Medical Management of Chronic Issues    6 month follow-up Not fasting   HPI-here w/wife 1.  DM-doesn't check.  Needs to see ophth.  Taking glipizide 5 mg daily. No lows that he is aware of. 2.   Hypertension-on amlodipine and losartan.  No chest pain, palpitations, no cough/sob/ha/dizziness.  Edema if stands long time 3.  HLD-on atorvastatin 20 mg daily.   4.  Prostate ca-has had prostatectomy and recurred so then radiation.  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  Seeing Dr. Quincy Simmonds start on hormones on 12/27.  PSA is increasing(38).   See problem oriented charting- ROS- ROS: Gen: no fever, chills  Skin: no rash, itching ENT: no ear pain, ear drainage, nasal congestion, rhinorrhea, sinus pressure, sore throat Eyes: no blurry vision, double vision Resp: no cough, wheeze.  + old dyspnea on exertion has had w/u CV: no CP, palpitations, GI: no heartburn, n/v/d/c, abd pain MSK: back pain if active Neuro: no dizziness, headache, weakness, vertigo Psych: no depression, anxiety, insomnia, SI   The following were reviewed and entered/updated in epic: Past Medical History:  Diagnosis Date   Bladder calculus    CAD (coronary artery disease)    non obs    CKD (chronic kidney disease), stage III (HCC)    " i have 35-40% of my kidney function"; mgd by pcp    COLONIC POLYPS, HX OF 12/02/2008   DIABETES MELLITUS, TYPE II 07/19/2007   Dyspnea    reports sob over the last 2 years , underwent cath  in feb 2020 , reports cath was clean , referred to pulmonologist that stopped his Actos and encouraged weight loss  ; reports today minimum improvement in breathing , appt with pulm in June 2020   Erectile dysfunction    GERD (gastroesophageal reflux disease)    Glaucoma    GOUT 07/19/2007   History of kidney stones    HYPERLIPIDEMIA 12/02/2008   HYPERTENSION  07/19/2007   Kidney stones    Prostate cancer (HCC) 2007   Reiter syndrome    incomplete   TIA (transient ischemic attack)    Tubular adenoma of colon    Vitamin D deficiency    Patient Active Problem List   Diagnosis Date Noted   TIA (transient ischemic attack) 03/04/2021   Vitamin D deficiency 02/15/2021   Incontinence when straining, Rodney [name] 05/21/2019   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/01/2018   Morbid (severe) obesity due to excess calories (HCC) complicated by hbp/dm 05/23/2018   Dyspnea on exertion    Ankle fracture 05/16/2015   Malignant neoplasm of prostate (HCC) 11/08/2013   Prostate cancer (HCC) 03/29/2012   Nephrolithiasis 03/29/2012   Chronic kidney disease (CKD), stage III (moderate) (HCC) 12/03/2010   Dyslipidemia 12/02/2008   History of colonic polyps 12/02/2008   Type 2 diabetes mellitus with stage 3 chronic kidney disease (HCC) 07/19/2007   Gout 07/19/2007   Essential hypertension 07/19/2007   Past Surgical History:  Procedure Laterality Date   bladder neck dilation     CATARACT EXTRACTION     both eyes- 2015   COLONOSCOPY  11/15/2021   12-11-14-polyps   CYSTOSCOPY N/A 06/11/2018   Procedure: CYSTOSCOPY WITH REMOVAL OF BLADDER CALCULUS;  Surgeon: Heloise Purpura, MD;  Location: [facility];  Service: Urology;  Laterality: N/A;   ELBOW SURGERY     bone spur - right   LEFT HEART  CATH AND CORONARY ANGIOGRAPHY N/A 03/19/2018   Procedure: LEFT HEART CATH AND CORONARY ANGIOGRAPHY;  Surgeon: Lyn Records, MD;  Location: [facility] INVASIVE CV LAB;  Service: Cardiovascular;  Laterality: N/A;   ORIF ANKLE FRACTURE Left 05/17/2015   Procedure: OPEN REDUCTION INTERNAL FIXATION (ORIF) ANKLE FRACTURE;  Surgeon: Cammy Copa, MD;  Location: [facility];  Service: Orthopedics;  Laterality: Left;   PROSTATECTOMY  2007   URINARY SPHINCTER IMPLANT N/A 05/21/2019   Procedure: IMPLANTATION OF ARTIFICIAL URINARY SPHINCTER CYSTOSCOPY;  Surgeon: Alfredo Martinez, MD;  Location: [facility];   Service: Urology;  Laterality: N/A;   VASECTOMY      Family History  Problem Relation Age of Onset   Cancer Sister        metastatic colon ca   Colon cancer Neg Hx    Esophageal cancer Neg Hx    Rectal cancer Neg Hx    Stomach cancer Neg Hx    Lung disease Neg Hx    Colon polyps Neg Hx    Pancreatic cancer Neg Hx     Medications- reviewed and updated Current Outpatient Medications  Medication Sig Dispense Refill   aspirin 81 MG chewable tablet Chew 1 tablet (81 mg total) by mouth daily. 30 tablet 1   atorvastatin (LIPITOR) 20 MG tablet TAKE 1 TABLET BY MOUTH AT BEDTIME . APPOINTMENT REQUIRED FOR FUTURE REFILLS 90 tablet 3   dorzolamide-timolol (COSOPT) 22.3-6.8 MG/ML ophthalmic solution Place 1 drop into both eyes every morning.     glucose blood (ACCU-CHEK AVIVA) test strip 1 each by Other route 2 (two) times daily. 100 each 3   amLODipine (NORVASC) 5 MG tablet Take 1 tablet (5 mg total) by mouth 2 (two) times daily. 180 tablet 1   glipiZIDE (GLUCOTROL) 5 MG tablet Take 1 tablet (5 mg total) by mouth daily before breakfast. 90 tablet 1   losartan (COZAAR) 50 MG tablet Take 1 tablet (50 mg total) by mouth at bedtime. 90 tablet 1   No current facility-administered medications for this visit.    Allergies-reviewed and updated Allergies  Allergen Reactions   Ace Inhibitors Swelling    Tongue swelling. Reaction to benazepril only.     Social History   Social History Narrative   6 grands 2 greats      Was competitive fisherman.   Former Holiday representative   Objective  Objective:  BP 120/Rodney   Pulse 74   Temp 98.3 F (36.8 C) (Temporal)   Resp 18   Ht 5\' 3"  (1.6 m)   Wt 233 lb (105.7 kg)   SpO2 99%   BMI 41.27 kg/m  Physical Exam  Gen: WDWN NAD HEENT: NCAT, conjunctiva not injected, sclera nonicteric NECK:  supple, no thyromegaly, no nodes, no carotid bruits CARDIAC: RRR, S1S2+, no murmur. DP 1+B LUNGS: CTAB. No wheezes ABDOMEN:  BS+, soft, NTND, No HSM, no  masses EXT:  1+edema MSK: no gross abnormalities. NEURO: A&O x3.  CN II-XII intact.  PSYCH: normal mood. Good eye contact     Assessment and Plan  1. Type 2 diabetes mellitus with stage 3b chronic kidney disease, without long-term current use of insulin (HCC) Chronic.  Controlled.  Continue glipizide 5 mg daily.  Work on diet/exercise.  Patient is on ARB, statin.  Advised to schedule appointment with ophthalmologist - Comprehensive metabolic panel - Hemoglobin A1c - Microalbumin / creatinine urine ratio - Vitamin B12 - glipiZIDE (GLUCOTROL) 5 MG tablet; Take 1 tablet (5 mg total) by mouth daily before breakfast.  Dispense: 90 tablet; Refill: 1  2. Essential hypertension Chronic.  Controlled.  Continue amlodipine 5 mg daily, losartan 50 mg daily.  For edema, wear compression stockings and elevate legs - Comprehensive metabolic panel - amLODipine (NORVASC) 5 MG tablet; Take 1 tablet (5 mg total) by mouth 2 (two) times daily.  Dispense: 180 tablet; Refill: 1 - losartan (COZAAR) 50 MG tablet; Take 1 tablet (50 mg total) by mouth at bedtime.  Dispense: 90 tablet; Refill: 1 - CBC with Differential/Platelet  3. Dyslipidemia Chronic.  Controlled.  Continue Lipitor 20 mg daily.   4. Prostate cancer (HCC)  Chronic.  Worsening PSA.  Will start hormone tx.  Managed by urol.   Recommended follow up: Return in about 6 months (around 06/30/2023) for chronic follow-up.     Angelena Sole, MD

## 2022-12-30 NOTE — Assessment & Plan Note (Signed)
Chronic.  Worsening PSA.  Will start hormone tx.  Managed by Janetta Hora

## 2023-01-01 NOTE — Progress Notes (Signed)
Sugars are too high.  Does he want to do better on diet/exercise and reck in 3 mo, or add metformin 500mg  daily as well and reck A1C and bmp in 3 mo?

## 2023-01-02 ENCOUNTER — Other Ambulatory Visit: Payer: Self-pay | Admitting: *Deleted

## 2023-01-02 DIAGNOSIS — N1832 Chronic kidney disease, stage 3b: Secondary | ICD-10-CM

## 2023-01-02 MED ORDER — EMPAGLIFLOZIN 10 MG PO TABS: 10.0000 mg | ORAL_TABLET | Freq: Every day | ORAL | 0 refills | Status: DC

## 2023-02-01 DIAGNOSIS — C7951 Secondary malignant neoplasm of bone: Secondary | ICD-10-CM | POA: Diagnosis not present

## 2023-02-24 DIAGNOSIS — Z9849 Cataract extraction status, unspecified eye: Secondary | ICD-10-CM | POA: Diagnosis not present

## 2023-02-24 DIAGNOSIS — E119 Type 2 diabetes mellitus without complications: Secondary | ICD-10-CM | POA: Diagnosis not present

## 2023-02-24 DIAGNOSIS — H53143 Visual discomfort, bilateral: Secondary | ICD-10-CM | POA: Diagnosis not present

## 2023-02-24 DIAGNOSIS — H401131 Primary open-angle glaucoma, bilateral, mild stage: Secondary | ICD-10-CM | POA: Diagnosis not present

## 2023-02-24 LAB — HM DIABETES EYE EXAM

## 2023-03-28 ENCOUNTER — Other Ambulatory Visit: Payer: Self-pay | Admitting: Family Medicine

## 2023-04-03 ENCOUNTER — Ambulatory Visit (INDEPENDENT_AMBULATORY_CARE_PROVIDER_SITE_OTHER): Payer: Medicare Other

## 2023-04-03 VITALS — Ht 68.0 in | Wt 233.0 lb

## 2023-04-03 DIAGNOSIS — N1832 Chronic kidney disease, stage 3b: Secondary | ICD-10-CM

## 2023-04-03 DIAGNOSIS — Z0000 Encounter for general adult medical examination without abnormal findings: Secondary | ICD-10-CM

## 2023-04-03 DIAGNOSIS — E1122 Type 2 diabetes mellitus with diabetic chronic kidney disease: Secondary | ICD-10-CM | POA: Diagnosis not present

## 2023-04-03 NOTE — Progress Notes (Addendum)
 Subjective:   Rodney [name] is a 83 y.o. who presents for a Medicare Wellness preventive visit.  Visit Complete: Virtual I connected with  Alinda Deem on 04/03/23 by a audio enabled telemedicine application and verified that I am speaking with the correct person using two identifiers.  Patient Location: Home  Provider Location: Office/Clinic  I discussed the limitations of evaluation and management by telemedicine. The patient expressed understanding and agreed to proceed.  Vital Signs: Because this visit was a virtual/telehealth visit, some criteria may be missing or patient reported. Any vitals not documented were not able to be obtained and vitals that have been documented are patient reported.  VideoDeclined- This patient declined Librarian, academic. Therefore the visit was completed with audio only.  AWV Questionnaire: No: Patient Medicare AWV questionnaire was not completed prior to this visit.  Cardiac Risk Factors include: advanced age (>53men, >27 women);obesity (BMI >30kg/m2);dyslipidemia;hypertension;male gender;diabetes mellitus     Objective:    Today's Vitals   04/03/23 1123  Weight: 233 lb (105.7 kg)  Height: 5\' 8"  (1.727 m)   Body mass index is 35.43 kg/m.     04/03/2023   11:26 AM 03/22/2022   11:07 AM 11/11/2021   12:41 PM 03/04/2021    7:31 AM 05/21/2019    3:34 PM 05/13/2019    8:14 AM 06/29/2018    2:24 AM  Advanced Directives  Does Patient Have a Medical Advance Directive? Yes Yes Yes No Yes Yes Yes  Type of Estate agent of [place];Living will Healthcare Power of [place];Living will Living will;Healthcare Power of [employer] Power of [place];Living will Healthcare Power of [place];Living will Healthcare Power of [place];Living will  Does patient want to make changes to medical advance directive?     No - Patient declined    Copy of Healthcare Power of Attorney in Chart? No - copy  requested No - copy requested     No - copy requested  Would patient like information on creating a medical advance directive?       No - Patient declined    Current Medications (verified) Outpatient Encounter Medications as of 04/03/2023  Medication Sig   amLODipine (NORVASC) 5 MG tablet Take 1 tablet (5 mg total) by mouth 2 (two) times daily.   atorvastatin (LIPITOR) 20 MG tablet TAKE 1 TABLET BY MOUTH AT BEDTIME . APPOINTMENT REQUIRED FOR FUTURE REFILLS   dorzolamide-timolol (COSOPT) 22.3-6.8 MG/ML ophthalmic solution Place 1 drop into both eyes every morning.   glipiZIDE (GLUCOTROL) 5 MG tablet Take 1 tablet (5 mg total) by mouth daily before breakfast.   glucose blood (ACCU-CHEK AVIVA) test strip 1 each by Other route 2 (two) times daily.   JARDIANCE 10 MG TABS tablet TAKE 1 TABLET BY MOUTH ONCE DAILY BEFORE BREAKFAST   losartan (COZAAR) 50 MG tablet Take 1 tablet (50 mg total) by mouth at bedtime.   [DISCONTINUED] aspirin 81 MG chewable tablet Chew 1 tablet (81 mg total) by mouth daily.   No facility-administered encounter medications on file as of 04/03/2023.    Allergies (verified) Ace inhibitors   History: Past Medical History:  Diagnosis Date   Bladder calculus    CAD (coronary artery disease)    non obs    CKD (chronic kidney disease), stage III (HCC)    " i have 35-40% of my kidney function"; mgd by pcp    COLONIC POLYPS, HX OF 12/02/2008   DIABETES MELLITUS, TYPE II 07/19/2007  Dyspnea    reports sob over the last 2 years , underwent cath  in feb 2020 , reports cath was clean , referred to pulmonologist that stopped his Actos and encouraged weight loss  ; reports today minimum improvement in breathing , appt with pulm in June 2020   Erectile dysfunction    GERD (gastroesophageal reflux disease)    Glaucoma    GOUT 07/19/2007   History of kidney stones    HYPERLIPIDEMIA 12/02/2008   HYPERTENSION 07/19/2007   Kidney stones    Prostate cancer (HCC) 2007   Reiter  syndrome    incomplete   TIA (transient ischemic attack)    Tubular adenoma of colon    Vitamin D deficiency    Past Surgical History:  Procedure Laterality Date   bladder neck dilation     CATARACT EXTRACTION     both eyes- 2015   COLONOSCOPY  11/15/2021   12-11-14-polyps   CYSTOSCOPY N/A 06/11/2018   Procedure: CYSTOSCOPY WITH REMOVAL OF BLADDER CALCULUS;  Surgeon: Heloise Purpura, MD;  Location: [facility];  Service: Urology;  Laterality: N/A;   ELBOW SURGERY     bone spur - right   LEFT HEART CATH AND CORONARY ANGIOGRAPHY N/A 03/19/2018   Procedure: LEFT HEART CATH AND CORONARY ANGIOGRAPHY;  Surgeon: Lyn Records, MD;  Location: [facility] INVASIVE CV LAB;  Service: Cardiovascular;  Laterality: N/A;   ORIF ANKLE FRACTURE Left 05/17/2015   Procedure: OPEN REDUCTION INTERNAL FIXATION (ORIF) ANKLE FRACTURE;  Surgeon: Cammy Copa, MD;  Location: [facility];  Service: Orthopedics;  Laterality: Left;   PROSTATECTOMY  2007   URINARY SPHINCTER IMPLANT N/A 05/21/2019   Procedure: IMPLANTATION OF ARTIFICIAL URINARY SPHINCTER CYSTOSCOPY;  Surgeon: Alfredo Martinez, MD;  Location: [facility];  Service: Urology;  Laterality: N/A;   VASECTOMY     Family History  Problem Relation Age of Onset   Cancer Sister        metastatic colon ca   Colon cancer Neg Hx    Esophageal cancer Neg Hx    Rectal cancer Neg Hx    Stomach cancer Neg Hx    Lung disease Neg Hx    Colon polyps Neg Hx    Pancreatic cancer Neg Hx    Social History   Socioeconomic History   Marital status: Married    Spouse name: Not on file   Number of children: 2   Years of education: Not on file   Highest education level: Not on file  Occupational History   Not on file  Tobacco Use   Smoking status: Former    Current packs/day: 0.00    Average packs/day: 1 pack/day for 30.0 years (30.0 ttl pk-yrs)    Types: Cigarettes    Start date: 01/25/1948    Quit date: 01/24/1978    Years since quitting: 45.2   Smokeless tobacco: Never   Vaping Use   Vaping status: Never Used  Substance and Sexual Activity   Alcohol use: Not Currently    Comment: occasionally -socially   Drug use: No   Sexual activity: Not Currently    Comment: 2 childern  Other Topics Concern   Not on file  Social History Narrative   6 grands 2 greats      Was competitive fisherman.   Former Holiday representative   Social Drivers of Corporate investment banker Strain: Low Risk  (04/03/2023)   Overall Financial Resource Strain (CARDIA)    Difficulty of Paying Living Expenses: Not hard at  all  Food Insecurity: No Food Insecurity (04/03/2023)   Hunger Vital Sign    Worried About Running Out of Food in the Last Year: Never true    Ran Out of Food in the Last Year: Never true  Transportation Needs: No Transportation Needs (04/03/2023)   PRAPARE - Administrator, Civil Service (Medical): No    Lack of Transportation (Non-Medical): No  Physical Activity: Inactive (04/03/2023)   Exercise Vital Sign    Days of Exercise per Week: 0 days    Minutes of Exercise per Session: 0 min  Stress: No Stress Concern Present (04/03/2023)   [employer] of Occupational Health - Occupational Stress Questionnaire    Feeling of Stress : Not at all  Social Connections: Moderately Integrated (04/03/2023)   Social Connection and Isolation Panel [NHANES]    Frequency of Communication with Friends and Family: More than three times a week    Frequency of Social Gatherings with Friends and Family: More than three times a week    Attends Religious Services: More than 4 times per year    Active Member of [employer] or Organizations: No    Attends Engineer, structural: Never    Marital Status: Married    Tobacco Counseling Counseling given: Not Answered    Clinical Intake:  Pre-visit preparation completed: Yes  Pain : No/denies pain     BMI - recorded: 35.43 Nutritional Status: BMI > 30  Obese Nutritional Risks: None Diabetes: No  How often do you  need to have someone help you when you read instructions, pamphlets, or other written materials from your doctor or pharmacy?: 1 - Never  Interpreter Needed?: No  Information entered by :: Lanier Ensign, LPN   Activities of Daily Living     04/03/2023   11:24 AM  In your present state of health, do you have any difficulty performing the following activities:  Hearing? 0  Vision? 0  Difficulty concentrating or making decisions? 0  Walking or climbing stairs? 0  Dressing or bathing? 0  Doing errands, shopping? 0  Preparing Food and eating ? N  Using the Toilet? N  In the past six months, have you accidently leaked urine? Y  Comment at times pad for accidents  Do you have problems with loss of bowel control? N  Managing your Medications? N  Managing your Finances? N  Housekeeping or managing your Housekeeping? N    Patient Care Team: Jeani Sow, MD as PCP - General (Family Medicine) Blima Ledger, OD (Optometry)  Indicate any recent Medical Services you may have received from other than [facility] providers in the past year (date may be approximate).     Assessment:   This is a routine wellness examination for Rodney [name].  Hearing/Vision screen Hearing Screening - Comments:: Pt denies any hearing issues  Vision Screening - Comments:: Pt [facility] for [facility] exam    Goals Addressed             This Visit's Progress    Patient Stated       Maintain health and activity        Depression Screen     04/03/2023   11:26 AM 12/30/2022   10:17 AM 06/30/2022   10:02 AM 03/22/2022   11:06 AM 08/30/2021    7:29 AM 05/17/2021    7:10 AM 03/11/2021    8:57 AM  PHQ 2/9 Scores  PHQ - 2 Score 0 0 0 0 0  0 0  PHQ- 9 Score  0 0  0 0 0    Fall Risk     04/03/2023   11:27 AM 12/30/2022   10:16 AM 06/30/2022   10:02 AM 03/22/2022   11:08 AM 08/30/2021    7:28 AM  Fall Risk   Falls in the past year? 0 0 0 0 0  Number falls in past yr: 0 0 0 0 0  Injury with  Fall? 0 0 0 0 0  Risk for fall due to : No Fall Risks No Fall Risks No Fall Risks Impaired vision No Fall Risks  Follow up Falls prevention discussed Falls evaluation completed Falls evaluation completed Falls prevention discussed Falls evaluation completed    MEDICARE RISK AT HOME:  Medicare Risk at Home Any stairs in or around the home?: Yes If so, are there any without handrails?: No Home free of loose throw rugs in walkways, pet beds, electrical cords, etc?: Yes Adequate lighting in your home to reduce risk of falls?: Yes Life alert?: No Use of a cane, walker or w/c?: No Grab bars in the bathroom?: Yes Shower chair or bench in shower?: No Elevated toilet seat or a handicapped toilet?: No  TIMED UP AND GO:  Was the test performed?  No  Cognitive Function: 6CIT completed        04/03/2023   11:28 AM 03/22/2022   11:09 AM  6CIT Screen  What Year? 0 points 0 points  What month? 0 points 0 points  What time? 0 points 0 points  Count back from 20 0 points 0 points  Months in reverse 0 points 0 points  Repeat phrase 0 points 0 points  Total Score 0 points 0 points    Immunizations Immunization History  Administered Date(s) Administered   PFIZER(Purple Top)SARS-COV-2 Vaccination 03/30/2019, 04/20/2019, 11/21/2019   PPD Test 08/06/2012   Pneumococcal Conjugate-13 09/08/2014   Pneumococcal Polysaccharide-23 12/03/2010   Tdap 12/03/2010    Screening Tests Health Maintenance  Topic Date Due   Zoster Vaccines- Shingrix (1 of 2) Never done   DTaP/Tdap/Td (2 - Td or Tdap) 12/02/2020   Diabetic kidney evaluation - Urine ACR  08/31/2022   COVID-19 Vaccine (4 - 2024-25 season) 09/25/2022   INFLUENZA VACCINE  04/24/2023 (Originally 08/25/2022)   FOOT EXAM  06/30/2023   HEMOGLOBIN A1C  06/30/2023   Diabetic kidney evaluation - eGFR measurement  12/30/2023   OPHTHALMOLOGY EXAM  02/24/2024   Medicare Annual Wellness (AWV)  04/02/2024   Pneumonia Vaccine 57+ Years old  Completed    HPV VACCINES  Aged Out   Colonoscopy  Discontinued    Health Maintenance  Health Maintenance Due  Topic Date Due   Zoster Vaccines- Shingrix (1 of 2) Never done   DTaP/Tdap/Td (2 - Td or Tdap) 12/02/2020   Diabetic kidney evaluation - Urine ACR  08/31/2022   COVID-19 Vaccine (4 - 2024-25 season) 09/25/2022   Health Maintenance Items Addressed: See Nurse Notes  Additional Screening:  Vision Screening: Recommended annual ophthalmology exams for early detection of glaucoma and other disorders of the eye.  Dental Screening: Recommended annual dental exams for proper oral hygiene  Community Resource Referral / Chronic Care Management: CRR required this visit?  No   CCM required this visit?  No     Plan:     I have personally reviewed and noted the following in the patient's chart:   Medical and social history Use of alcohol, tobacco or illicit drugs  Current medications  and supplements including opioid prescriptions. Patient is not currently taking opioid prescriptions. Functional ability and status Nutritional status Physical activity Advanced directives List of other physicians Hospitalizations, surgeries, and ER visits in previous 12 months Vitals Screenings to include cognitive, depression, and falls Referrals and appointments  In addition, I have reviewed and discussed with patient certain preventive protocols, quality metrics, and best practice recommendations. A written personalized care plan for preventive services as well as general preventive health recommendations were provided to patient.     Marzella Schlein, LPN   [id number]   After Visit Summary: (MyChart) Due to this being a telephonic visit, the after visit summary with patients personalized plan was offered to patient via MyChart   Notes: Nothing significant to report at this time.

## 2023-04-03 NOTE — Patient Instructions (Signed)
 Rodney [name] , Thank you for taking time to come for your Medicare Wellness Visit. I appreciate your ongoing commitment to your health goals. Please review the following plan we discussed and let me know if I can assist you in the future.   Referrals/Orders/Follow-Ups/Clinician Recommendations: Aim for 30 minutes of exercise or brisk walking, 6-8 glasses of water, and 5 servings of fruits and vegetables each day. Each day, aim for 6 glasses of water, plenty of protein in your diet and try to get up and walk/ stretch every hour for 5-10 minutes at a time.    This is a list of the screening recommended for you and due dates:  Health Maintenance  Topic Date Due   Zoster (Shingles) Vaccine (1 of 2) Never done   DTaP/Tdap/Td vaccine (2 - Td or Tdap) 12/02/2020   Yearly kidney health urinalysis for diabetes  08/31/2022   COVID-19 Vaccine (4 - 2024-25 season) 09/25/2022   Medicare Annual Wellness Visit  03/23/2023   Flu Shot  04/24/2023*   Complete foot exam   06/30/2023   Hemoglobin A1C  06/30/2023   Yearly kidney function blood test for diabetes  12/30/2023   Eye exam for diabetics  02/24/2024   Pneumonia Vaccine  Completed   HPV Vaccine  Aged Out   Colon Cancer Screening  Discontinued  *Topic was postponed. The date shown is not the original due date.    Advanced directives: (Copy Requested) Please bring a copy of your health care power of attorney and living will to the office to be added to your chart at your convenience. You can mail to [facility] [address] [place], [place] [address] or email to [email or web address]  Next Medicare Annual Wellness Visit scheduled for next year: Yes

## 2023-04-04 ENCOUNTER — Other Ambulatory Visit (INDEPENDENT_AMBULATORY_CARE_PROVIDER_SITE_OTHER): Payer: Medicare Other

## 2023-04-04 ENCOUNTER — Encounter: Payer: Self-pay | Admitting: Family Medicine

## 2023-04-04 DIAGNOSIS — N1832 Chronic kidney disease, stage 3b: Secondary | ICD-10-CM

## 2023-04-04 DIAGNOSIS — E1122 Type 2 diabetes mellitus with diabetic chronic kidney disease: Secondary | ICD-10-CM | POA: Diagnosis not present

## 2023-04-04 LAB — BASIC METABOLIC PANEL
BUN: 19 mg/dL (ref 6–23)
CO2: 25 meq/L (ref 19–32)
Calcium: 9.3 mg/dL (ref 8.4–10.5)
Chloride: 106 meq/L (ref 96–112)
Creatinine, Ser: 1.64 mg/dL — ABNORMAL HIGH (ref 0.40–1.50)
GFR: 38.63 mL/min — ABNORMAL LOW (ref >60.00)
Glucose, Bld: 216 mg/dL — ABNORMAL HIGH (ref 70–99)
Potassium: 4.2 meq/L (ref 3.5–5.1)
Sodium: 137 meq/L (ref 135–145)

## 2023-04-04 LAB — HEMOGLOBIN A1C: Hgb A1c MFr Bld: 7.9 % — ABNORMAL HIGH (ref 4.6–6.5)

## 2023-04-04 NOTE — Progress Notes (Signed)
 Sugars still up some.  Would he be interested in starting ozempic?  If so, sch virtual visit(or in-person) to discuss

## 2023-04-10 ENCOUNTER — Ambulatory Visit (INDEPENDENT_AMBULATORY_CARE_PROVIDER_SITE_OTHER): Admitting: Family Medicine

## 2023-04-10 ENCOUNTER — Encounter: Payer: Self-pay | Admitting: Family Medicine

## 2023-04-10 VITALS — BP 121/72 | HR 80 | Temp 97.9°F | Resp 16 | Ht 68.0 in | Wt 227.2 lb

## 2023-04-10 DIAGNOSIS — N1831 Chronic kidney disease, stage 3a: Secondary | ICD-10-CM

## 2023-04-10 DIAGNOSIS — R300 Dysuria: Secondary | ICD-10-CM

## 2023-04-10 DIAGNOSIS — E1122 Type 2 diabetes mellitus with diabetic chronic kidney disease: Secondary | ICD-10-CM

## 2023-04-10 DIAGNOSIS — Z7984 Long term (current) use of oral hypoglycemic drugs: Secondary | ICD-10-CM

## 2023-04-10 LAB — POC URINALSYSI DIPSTICK (AUTOMATED)
Bilirubin, UA: NEGATIVE
Blood, UA: NEGATIVE
Glucose, UA: POSITIVE — AB
Ketones, UA: NEGATIVE
Leukocytes, UA: NEGATIVE
Nitrite, UA: NEGATIVE
Protein, UA: NEGATIVE
Spec Grav, UA: 1.015 (ref 1.010–1.025)
Urobilinogen, UA: 0.2 U/dL
pH, UA: 6 (ref 5.0–8.0)

## 2023-04-10 MED ORDER — RYBELSUS 3 MG PO TABS: 3.0000 mg | ORAL_TABLET | Freq: Every day | ORAL | 0 refills | Status: DC

## 2023-04-10 MED ORDER — CEPHALEXIN 500 MG PO CAPS
500.0000 mg | ORAL_CAPSULE | Freq: Two times a day (BID) | ORAL | 0 refills | Status: AC
Start: 2023-04-10 — End: 2023-04-17

## 2023-04-10 NOTE — Patient Instructions (Signed)
 It was very nice to see you today!  Sending in rybelsis and keflex.  Get the Jardiance filled   PLEASE NOTE:  If you had any lab tests please let us know if you have not heard back within a few days. You may see your results on MyChart before we have a chance to review them but we will give you a call once they are reviewed by [place]. If we ordered any referrals today, please let us know if you have not heard from their office within the next week.   Please try these tips to maintain a healthy lifestyle:  Eat most of your calories during the day when you are active. Eliminate processed foods including packaged sweets (pies, cakes, cookies), reduce intake of potatoes, white bread, white pasta, and white rice. Look for whole grain options, oat flour or almond flour.  Each meal should contain half fruits/vegetables, one quarter protein, and one quarter carbs (no bigger than a computer mouse).  Cut down on sweet beverages. This includes juice, soda, and sweet tea. Also watch fruit intake, though this is a healthier sweet option, it still contains natural sugar! Limit to 3 servings daily.  Drink at least 1 glass of water with each meal and aim for at least 8 glasses per day  Exercise at least 150 minutes every week.

## 2023-04-10 NOTE — Progress Notes (Signed)
 Subjective:     Patient ID: Rodney [name], male    DOB: 04-Dec-1940, 83 y.o.   MRN: [id number]  Chief Complaint  Patient presents with   Medication Follow-up    Follow-up to discuss recent lab results and treatment options Possible bladder infection due to discharge that started a couple of weeks ago    HPI  DM 2- Discussed the use of AI scribe software for clinical note transcription with the patient, who gave verbal consent to proceed.  History of Present Illness   Rodney [name] is an 83 year old male with diabetes and kidney function concerns who presents for medication management and urinary symptoms.  He has diabetes with suboptimal management, as indicated by a recent HbA1c of 7.9% and frequent blood glucose levels over 200 mg/dL. He is currently taking glipizide 5 mg daily and Jardiance 10 mg daily, although he ran out of Jardiance three days ago. He has been on Jardiance for a couple of months. He previously took metformin but was discontinued due to adverse effects on kidney function, resulting in a 40% loss of filtration ability. He describes his diet as not particularly high in carbohydrates, mentioning frequent salads.  His kidney function is a concern, attributed to age and long-standing diabetes. He is on medications that provide renal protection, including Jardiance and losartan. He notes a significant decline in kidney function when on metformin, which was subsequently discontinued.  He experiences urinary symptoms, including a 'sticky' discharge that is yellow or white in color, present for a couple of weeks. No burning sensation during urination, fever, chills, nausea, or testicular pain. He has a history of prostate surgery and wears a pad due to dribbling, reporting increased difficulty with urination recently. These symptoms have not yet been discussed with his urologist.  He has a history of prostate cancer, for which he has undergone radiation therapy three  times. His PSA levels have been fluctuating, and he is currently in a watchful waiting phase with his urologist, planning to reassess in April. He mentions that his PSA had dropped slightly at the last check, delaying the start of hormone therapy.       Health Maintenance Due  Topic Date Due   Diabetic kidney evaluation - Urine ACR  08/31/2022    Past Medical History:  Diagnosis Date   Bladder calculus    CAD (coronary artery disease)    non obs    CKD (chronic kidney disease), stage III (HCC)    " i have 35-40% of my kidney function"; mgd by pcp    COLONIC POLYPS, HX OF 12/02/2008   DIABETES MELLITUS, TYPE II 07/19/2007   Dyspnea    reports sob over the last 2 years , underwent cath  in feb 2020 , reports cath was clean , referred to pulmonologist that stopped his Actos and encouraged weight loss  ; reports today minimum improvement in breathing , appt with pulm in June 2020   Erectile dysfunction    GERD (gastroesophageal reflux disease)    Glaucoma    GOUT 07/19/2007   History of kidney stones    HYPERLIPIDEMIA 12/02/2008   HYPERTENSION 07/19/2007   Kidney stones    Prostate cancer (HCC) 2007   Reiter syndrome    incomplete   TIA (transient ischemic attack)    Tubular adenoma of colon    Vitamin D deficiency     Past Surgical History:  Procedure Laterality Date   bladder neck dilation  CATARACT EXTRACTION     both eyes- 2015   COLONOSCOPY  11/15/2021   12-11-14-polyps   CYSTOSCOPY N/A 06/11/2018   Procedure: CYSTOSCOPY WITH REMOVAL OF BLADDER CALCULUS;  Surgeon: Heloise Purpura, MD;  Location: [facility];  Service: Urology;  Laterality: N/A;   ELBOW SURGERY     bone spur - right   LEFT HEART CATH AND CORONARY ANGIOGRAPHY N/A 03/19/2018   Procedure: LEFT HEART CATH AND CORONARY ANGIOGRAPHY;  Surgeon: Lyn Records, MD;  Location: [facility];  Service: Cardiovascular;  Laterality: N/A;   ORIF ANKLE FRACTURE Left 05/17/2015   Procedure: OPEN REDUCTION INTERNAL  FIXATION (ORIF) ANKLE FRACTURE;  Surgeon: Cammy Copa, MD;  Location: [facility];  Service: Orthopedics;  Laterality: Left;   PROSTATECTOMY  2007   URINARY SPHINCTER IMPLANT N/A 05/21/2019   Procedure: IMPLANTATION OF ARTIFICIAL URINARY SPHINCTER CYSTOSCOPY;  Surgeon: Alfredo Martinez, MD;  Location: [facility];  Service: Urology;  Laterality: N/A;   VASECTOMY       Current Outpatient Medications:    amLODipine (NORVASC) 5 MG tablet, Take 1 tablet (5 mg total) by mouth 2 (two) times daily., Disp: 180 tablet, Rfl: 1   atorvastatin (LIPITOR) 20 MG tablet, TAKE 1 TABLET BY MOUTH AT BEDTIME . APPOINTMENT REQUIRED FOR FUTURE REFILLS, Disp: 90 tablet, Rfl: 3   cephALEXin (KEFLEX) 500 MG capsule, Take 1 capsule (500 mg total) by mouth 2 (two) times daily for 7 days. Take for 7 days, Disp: 14 capsule, Rfl: 0   dorzolamide-timolol (COSOPT) 22.3-6.8 MG/ML ophthalmic solution, Place 1 drop into both eyes every morning., Disp: , Rfl:    glipiZIDE (GLUCOTROL) 5 MG tablet, Take 1 tablet (5 mg total) by mouth daily before breakfast., Disp: 90 tablet, Rfl: 1   glucose blood (ACCU-CHEK AVIVA) test strip, 1 each by Other route 2 (two) times daily., Disp: 100 each, Rfl: 3   JARDIANCE 10 MG TABS tablet, TAKE 1 TABLET BY MOUTH ONCE DAILY BEFORE BREAKFAST, Disp: 90 tablet, Rfl: 0   losartan (COZAAR) 50 MG tablet, Take 1 tablet (50 mg total) by mouth at bedtime., Disp: 90 tablet, Rfl: 1   Semaglutide (RYBELSUS) 3 MG TABS, Take 1 tablet (3 mg total) by mouth daily., Disp: 30 tablet, Rfl: 0  Allergies  Allergen Reactions   Ace Inhibitors Swelling    Tongue swelling. Reaction to benazepril only.    ROS neg/noncontributory except as noted HPI/below      Objective:     BP 121/72   Pulse 80   Temp 97.9 F (36.6 C) (Temporal)   Resp 16   Ht 5\' 8"  (1.727 m)   Wt 227 lb 4 oz (103.1 kg)   SpO2 98%   BMI 34.55 kg/m  Wt Readings from Last 3 Encounters:  04/10/23 227 lb 4 oz (103.1 kg)  04/03/23 233 lb (105.7  kg)  12/30/22 233 lb (105.7 kg)    Physical Exam   Gen: [facility] NAD HEENT: NCAT, conjunctiva not injected, sclera nonicteric CARDIAC: RRR, S1S2+, no murmur.  LUNGS: CTAB. No wheezes ABDOMEN:  BS+, soft,sl tender diffusely, No HSM, no masses no CVAT EXT:  no edema MSK: no gross abnormalities.  NEURO: A&O x3.  CN II-XII intact.  PSYCH: normal mood. Good eye contact  Reviewed labs  Results for orders placed or performed in visit on 04/10/23  POCT Urinalysis Dipstick (Automated)   Collection Time: 04/10/23  9:38 AM  Result Value Ref Range   Color, UA YELLOW    Clarity, UA  CLEAR    Glucose, UA Positive (A) Negative   Bilirubin, UA NEG    Ketones, UA NEG    Spec Grav, UA 1.015 1.010 - 1.025   Blood, UA NEG    pH, UA 6.0 5.0 - 8.0   Protein, UA Negative Negative   Urobilinogen, UA 0.2 0.2 or 1.0 E.U./dL   Nitrite, UA NEG    Leukocytes, UA Negative Negative        Assessment & Plan:  Type 2 diabetes mellitus with stage 3a chronic kidney disease, without long-term current use of insulin (HCC) -     Microalbumin / creatinine urine ratio  Dysuria -     POCT Urinalysis Dipstick (Automated) -     Urine Culture  Other orders -     Rybelsus; Take 1 tablet (3 mg total) by mouth daily.  Dispense: 30 tablet; Refill: 0 -     Cephalexin; Take 1 capsule (500 mg total) by mouth 2 (two) times daily for 7 days. Take for 7 days  Dispense: 14 capsule; Refill: 0  Assessment and Plan    Diabetes Mellitus Type 2   Diabetes management is suboptimal with an HbA1c of 7.9% and frequent blood glucose levels over 200 mg/dL. He is currently on glipizide and Jardiance, with metformin discontinued due to renal concerns. Increasing glipizide poses a hypoglycemia risk. Discussed adding Ozempic or Rybelsus to improve glycemic control and aid weight loss. He is open to Rybelsus, considering cost. Rybelsus may reduce appetite and potentially allow future discontinuation of glipizide. Alternatives include  increasing glipizide, adding another oral medication, or starting insulin. Refill Jardiance, prescribe Rybelsus, and monitor response in one month. Discuss Rybelsus cost and adjust plan if needed.  also discussed ozempic  Chronic Kidney Disease   Chronic kidney disease is likely secondary to long-standing diabetes. Jardiance and losartan are crucial for renal protection, given the decline in kidney filtration. No nephrologist follow-up currently. Continue Jardiance and losartan. Order a urine test to assess proteinuria.  Possible Prostatitis/UTI,other   He reports discomfort and yellowish discharge, suggesting possible prostatitis. The prostate was previously removed, and PSA levels have fluctuated. A urologist visit is planned for April to reassess PSA and consider hormone therapy. Initiate Keflex for potential infection while awaiting urine culture results. Prescribe Keflex and order a urine culture to confirm diagnosis and adjust antibiotics if necessary.  Follow-up   A follow-up appointment is scheduled for June 10 to reassess diabetes management and renal function.        Return for as sch in June.  Angelena Sole, MD

## 2023-04-11 ENCOUNTER — Encounter: Payer: Self-pay | Admitting: Family Medicine

## 2023-04-11 LAB — URINE CULTURE
MICRO NUMBER:: 16209634
SPECIMEN QUALITY:: ADEQUATE

## 2023-04-11 NOTE — Progress Notes (Signed)
 Urine culture negative.  How are symptoms?  May need to f/u w/urologist if continues

## 2023-04-17 ENCOUNTER — Other Ambulatory Visit (HOSPITAL_COMMUNITY): Payer: Self-pay

## 2023-05-28 ENCOUNTER — Emergency Department (HOSPITAL_COMMUNITY)

## 2023-05-28 ENCOUNTER — Other Ambulatory Visit: Payer: Self-pay

## 2023-05-28 ENCOUNTER — Inpatient Hospital Stay (HOSPITAL_COMMUNITY)
Admission: EM | Admit: 2023-05-28 | Discharge: 2023-05-30 | DRG: 271 | Disposition: A | Discharge status: Home / Self Care | Attending: Internal Medicine | Admitting: Internal Medicine

## 2023-05-28 ENCOUNTER — Encounter (HOSPITAL_COMMUNITY): Payer: Self-pay

## 2023-05-28 DIAGNOSIS — I824Y2 Acute embolism and thrombosis of unspecified deep veins of left proximal lower extremity: Secondary | ICD-10-CM

## 2023-05-28 DIAGNOSIS — I82452 Acute embolism and thrombosis of left peroneal vein: Secondary | ICD-10-CM | POA: Diagnosis present

## 2023-05-28 DIAGNOSIS — K8689 Other specified diseases of pancreas: Secondary | ICD-10-CM | POA: Diagnosis not present

## 2023-05-28 DIAGNOSIS — M79662 Pain in left lower leg: Secondary | ICD-10-CM | POA: Diagnosis not present

## 2023-05-28 DIAGNOSIS — I871 Compression of vein: Secondary | ICD-10-CM | POA: Diagnosis present

## 2023-05-28 DIAGNOSIS — E559 Vitamin D deficiency, unspecified: Secondary | ICD-10-CM | POA: Diagnosis present

## 2023-05-28 DIAGNOSIS — I129 Hypertensive chronic kidney disease with stage 1 through stage 4 chronic kidney disease, or unspecified chronic kidney disease: Secondary | ICD-10-CM | POA: Diagnosis present

## 2023-05-28 DIAGNOSIS — E785 Hyperlipidemia, unspecified: Secondary | ICD-10-CM | POA: Diagnosis present

## 2023-05-28 DIAGNOSIS — Z888 Allergy status to other drugs, medicaments and biological substances status: Secondary | ICD-10-CM | POA: Diagnosis not present

## 2023-05-28 DIAGNOSIS — N183 Chronic kidney disease, stage 3 unspecified: Secondary | ICD-10-CM | POA: Diagnosis not present

## 2023-05-28 DIAGNOSIS — I82422 Acute embolism and thrombosis of left iliac vein: Secondary | ICD-10-CM | POA: Diagnosis not present

## 2023-05-28 DIAGNOSIS — C61 Malignant neoplasm of prostate: Secondary | ICD-10-CM | POA: Diagnosis present

## 2023-05-28 DIAGNOSIS — I10 Essential (primary) hypertension: Secondary | ICD-10-CM | POA: Diagnosis present

## 2023-05-28 DIAGNOSIS — M109 Gout, unspecified: Secondary | ICD-10-CM | POA: Diagnosis not present

## 2023-05-28 DIAGNOSIS — I82409 Acute embolism and thrombosis of unspecified deep veins of unspecified lower extremity: Secondary | ICD-10-CM

## 2023-05-28 DIAGNOSIS — E1122 Type 2 diabetes mellitus with diabetic chronic kidney disease: Secondary | ICD-10-CM | POA: Diagnosis present

## 2023-05-28 DIAGNOSIS — Z8673 Personal history of transient ischemic attack (TIA), and cerebral infarction without residual deficits: Secondary | ICD-10-CM

## 2023-05-28 DIAGNOSIS — R2243 Localized swelling, mass and lump, lower limb, bilateral: Secondary | ICD-10-CM | POA: Diagnosis not present

## 2023-05-28 DIAGNOSIS — K8020 Calculus of gallbladder without cholecystitis without obstruction: Secondary | ICD-10-CM | POA: Diagnosis not present

## 2023-05-28 DIAGNOSIS — Z923 Personal history of irradiation: Secondary | ICD-10-CM

## 2023-05-28 DIAGNOSIS — Z95820 Peripheral vascular angioplasty status with implants and grafts: Secondary | ICD-10-CM | POA: Diagnosis not present

## 2023-05-28 DIAGNOSIS — C799 Secondary malignant neoplasm of unspecified site: Secondary | ICD-10-CM

## 2023-05-28 DIAGNOSIS — I82432 Acute embolism and thrombosis of left popliteal vein: Secondary | ICD-10-CM | POA: Diagnosis not present

## 2023-05-28 DIAGNOSIS — Z7984 Long term (current) use of oral hypoglycemic drugs: Secondary | ICD-10-CM | POA: Diagnosis not present

## 2023-05-28 DIAGNOSIS — I2510 Atherosclerotic heart disease of native coronary artery without angina pectoris: Secondary | ICD-10-CM | POA: Diagnosis present

## 2023-05-28 DIAGNOSIS — K219 Gastro-esophageal reflux disease without esophagitis: Secondary | ICD-10-CM | POA: Diagnosis not present

## 2023-05-28 DIAGNOSIS — E038 Other specified hypothyroidism: Secondary | ICD-10-CM | POA: Diagnosis not present

## 2023-05-28 DIAGNOSIS — I82462 Acute embolism and thrombosis of left calf muscular vein: Secondary | ICD-10-CM | POA: Diagnosis not present

## 2023-05-28 DIAGNOSIS — Z87891 Personal history of nicotine dependence: Secondary | ICD-10-CM

## 2023-05-28 DIAGNOSIS — M7989 Other specified soft tissue disorders: Secondary | ICD-10-CM | POA: Diagnosis not present

## 2023-05-28 DIAGNOSIS — I82412 Acute embolism and thrombosis of left femoral vein: Principal | ICD-10-CM | POA: Diagnosis present

## 2023-05-28 DIAGNOSIS — Z79899 Other long term (current) drug therapy: Secondary | ICD-10-CM | POA: Diagnosis not present

## 2023-05-28 DIAGNOSIS — Z9079 Acquired absence of other genital organ(s): Secondary | ICD-10-CM

## 2023-05-28 DIAGNOSIS — Z48812 Encounter for surgical aftercare following surgery on the circulatory system: Secondary | ICD-10-CM | POA: Diagnosis not present

## 2023-05-28 DIAGNOSIS — K668 Other specified disorders of peritoneum: Secondary | ICD-10-CM | POA: Diagnosis not present

## 2023-05-28 DIAGNOSIS — N50812 Left testicular pain: Secondary | ICD-10-CM | POA: Diagnosis present

## 2023-05-28 LAB — CBC
HCT: 40.1 % (ref 39.0–52.0)
Hemoglobin: 12.6 g/dL — ABNORMAL LOW (ref 13.0–17.0)
MCH: 32.1 pg (ref 26.0–34.0)
MCHC: 31.4 g/dL (ref 30.0–36.0)
MCV: 102 fL — ABNORMAL HIGH (ref 80.0–100.0)
Platelets: 166 10*3/uL (ref 150–400)
RBC: 3.93 MIL/uL — ABNORMAL LOW (ref 4.22–5.81)
RDW: 13.9 % (ref 11.5–15.5)
WBC: 7 10*3/uL (ref 4.0–10.5)
nRBC: 0 % (ref 0.0–0.2)

## 2023-05-28 LAB — COMPREHENSIVE METABOLIC PANEL WITH GFR
ALT: 16 U/L (ref 0–44)
AST: 24 U/L (ref 15–41)
Albumin: 3.8 g/dL (ref 3.5–5.0)
Alkaline Phosphatase: 85 U/L (ref 38–126)
Anion gap: 8 (ref 5–15)
BUN: 24 mg/dL — ABNORMAL HIGH (ref 8–23)
CO2: 20 mmol/L — ABNORMAL LOW (ref 22–32)
Calcium: 8.9 mg/dL (ref 8.9–10.3)
Chloride: 108 mmol/L (ref 98–111)
Creatinine, Ser: 1.83 mg/dL — ABNORMAL HIGH (ref 0.61–1.24)
GFR, Estimated: 36 mL/min — ABNORMAL LOW (ref >60)
Glucose, Bld: 289 mg/dL — ABNORMAL HIGH (ref 70–99)
Potassium: 4.5 mmol/L (ref 3.5–5.1)
Sodium: 136 mmol/L (ref 135–145)
Total Bilirubin: 1.5 mg/dL — ABNORMAL HIGH (ref 0.0–1.2)
Total Protein: 6.7 g/dL (ref 6.5–8.1)

## 2023-05-28 LAB — GLUCOSE, CAPILLARY: Glucose-Capillary: 185 mg/dL — ABNORMAL HIGH (ref 70–99)

## 2023-05-28 MED ORDER — HEPARIN (PORCINE) 25000 UT/250ML-% IV SOLN
1500.0000 [IU]/h | INTRAVENOUS | Status: DC
Start: 2023-05-28 — End: 2023-05-29
  Administered 2023-05-29: 1500 [IU]/h via INTRAVENOUS
  Filled 2023-05-28: qty 250

## 2023-05-28 MED ORDER — HEPARIN BOLUS VIA INFUSION
4500.0000 [IU] | Freq: Once | INTRAVENOUS | Status: AC
  Administered 2023-05-29: 4500 [IU] via INTRAVENOUS
  Filled 2023-05-28: qty 4500

## 2023-05-28 MED ORDER — AMLODIPINE BESYLATE 5 MG PO TABS
5.0000 mg | ORAL_TABLET | Freq: Two times a day (BID) | ORAL | Status: DC
  Administered 2023-05-28 – 2023-05-30 (×4): 5 mg via ORAL
  Filled 2023-05-28 (×4): qty 1

## 2023-05-28 MED ORDER — ONDANSETRON HCL 4 MG PO TABS: 4.0000 mg | ORAL_TABLET | Freq: Four times a day (QID) | ORAL | Status: DC | PRN

## 2023-05-28 MED ORDER — INSULIN ASPART 100 UNIT/ML IJ SOLN
0.0000 [IU] | Freq: Three times a day (TID) | INTRAMUSCULAR | Status: DC
  Administered 2023-05-29: 3 [IU] via SUBCUTANEOUS
  Administered 2023-05-29: 8 [IU] via SUBCUTANEOUS
  Administered 2023-05-30 (×2): 3 [IU] via SUBCUTANEOUS

## 2023-05-28 MED ORDER — INSULIN ASPART 100 UNIT/ML IJ SOLN: 0.0000 [IU] | Freq: Every day | INTRAMUSCULAR | Status: DC

## 2023-05-28 MED ORDER — MORPHINE SULFATE (PF) 2 MG/ML IV SOLN: 2.0000 mg | INTRAVENOUS | Status: DC | PRN

## 2023-05-28 MED ORDER — ONDANSETRON HCL 4 MG/2ML IJ SOLN: 4.0000 mg | Freq: Four times a day (QID) | INTRAMUSCULAR | Status: DC | PRN

## 2023-05-28 MED ORDER — IOHEXOL 350 MG/ML SOLN
75.0000 mL | Freq: Once | INTRAVENOUS | Status: AC | PRN
  Administered 2023-05-28: 75 mL via INTRAVENOUS

## 2023-05-28 NOTE — Plan of Care (Signed)

## 2023-05-28 NOTE — Progress Notes (Signed)
 PHARMACY - ANTICOAGULATION CONSULT NOTE  Pharmacy Consult for heparin  Indication: DVT  Allergies  Allergen Reactions   Ace Inhibitors Swelling    Tongue swelling. Reaction to benazepril only.     Patient Measurements: Height: 5\' 8"  (172.7 cm) Weight: 103 kg (227 lb 1.2 oz) IBW/kg (Calculated) : 68.4 HEPARIN  DW (KG): 90.7  Vital Signs: Temp: 98.1 F (36.7 C) (05/04 2211) Temp Source: Oral (05/04 2211) BP: 113/69 (05/04 2200) Pulse Rate: 92 (05/04 2200)  Labs: Recent Labs    05/28/23 1642  HGB 12.6*  HCT 40.1  PLT 166  CREATININE 1.83*    Estimated Creatinine Clearance: 36.2 mL/min (A) (by C-G formula based on SCr of 1.83 mg/dL (H)).   Medical History: Past Medical History:  Diagnosis Date   Bladder calculus    CAD (coronary artery disease)    non obs    CKD (chronic kidney disease), stage III (HCC)    " i have 35-40% of my kidney function"; mgd by pcp    COLONIC POLYPS, HX OF 12/02/2008   DIABETES MELLITUS, TYPE II 07/19/2007   Dyspnea    reports sob over the last 2 years , underwent cath  in feb 2020 , reports cath was clean , referred to pulmonologist that stopped his Actos  and encouraged weight loss  ; reports today minimum improvement in breathing , appt with pulm in June 2020   Erectile dysfunction    GERD (gastroesophageal reflux disease)    Glaucoma    GOUT 07/19/2007   History of kidney stones    HYPERLIPIDEMIA 12/02/2008   HYPERTENSION 07/19/2007   Kidney stones    Prostate cancer (HCC) 2007   Reiter syndrome    incomplete   TIA (transient ischemic attack)    Tubular adenoma of colon    Vitamin D  deficiency      Assessment: 40 yoM presenting with left lower extremity pain and swelling with suspected DVT. Vascular team has recommended heparin  overnight until confirmatory imaging tomorrow. No anticoagulation prior to admission. Pharmacy to manage heparin  infusion.   Goal of Therapy:  Heparin  level 0.3-0.7 units/ml Monitor platelets by  anticoagulation protocol: Yes   Plan:  Give 4500 units bolus x 1 Start heparin  infusion at 1500 units/hr Check anti-Xa level in 8 hours and daily while on heparin  Continue to monitor H&H and platelets  Mohammed Andrew, PharmD Clinical Pharmacist 05/28/2023 10:44 PM Please check AMION for all [facility] numbers

## 2023-05-28 NOTE — Progress Notes (Incomplete)
 PHARMACY - ANTICOAGULATION CONSULT NOTE  Pharmacy Consult for heparin   Indication: DVT  Allergies  Allergen Reactions   Ace Inhibitors Swelling    Tongue swelling. Reaction to benazepril only.     Patient Measurements:    Vital Signs: Temp: 97.8 F (36.6 C) (05/04 2144) Temp Source: Oral (05/04 2144) BP: 134/75 (05/04 2144) Pulse Rate: 95 (05/04 2144)  Labs: Recent Labs    05/28/23 1642  HGB 12.6*  HCT 40.1  PLT 166  CREATININE 1.83*    CrCl cannot be calculated (Unknown ideal weight.).   Medical History: Past Medical History:  Diagnosis Date   Bladder calculus    CAD (coronary artery disease)    non obs    CKD (chronic kidney disease), stage III (HCC)    " i have 35-40% of my kidney function"; mgd by pcp    COLONIC POLYPS, HX OF 12/02/2008   DIABETES MELLITUS, TYPE II 07/19/2007   Dyspnea    reports sob over the last 2 years , underwent cath  in feb 2020 , reports cath was clean , referred to pulmonologist that stopped his Actos  and encouraged weight loss  ; reports today minimum improvement in breathing , appt with pulm in June 2020   Erectile dysfunction    GERD (gastroesophageal reflux disease)    Glaucoma    GOUT 07/19/2007   History of kidney stones    HYPERLIPIDEMIA 12/02/2008   HYPERTENSION 07/19/2007   Kidney stones    Prostate cancer (HCC) 2007   Reiter syndrome    incomplete   TIA (transient ischemic attack)    Tubular adenoma of colon    Vitamin D  deficiency    Assessment: Patient with CC of leg swelling. CTA negative for PE, vascular ultrasound ordered.    Goal of Therapy:  {[id number]} {Monitor platelets by anticoagulation protocol:3041561::"Monitor platelets by anticoagulation protocol: Yes"}   Plan:  {WJXB:[id number]}  Synthia Ewing 05/28/2023,9:51 PM

## 2023-05-28 NOTE — H&P (Signed)
 History and Physical    Patient: Rodney [name]:[id number] DOB: 1940/08/26 DOA: 05/28/2023 DOS: the patient was seen and examined on 05/28/2023 PCP: Christel Cousins, MD  Patient coming from: Home  Chief Complaint:  Chief Complaint  Patient presents with   Leg Swelling   Testicle Pain   HPI: Rodney [name] is a 83 y.o. male with medical history significant of prostate cancer status posttreatment and recurrence, type 2 diabetes, nephrolithiasis, erectile dysfunctions, essential hypertension, hyperlipidemia, coronary artery disease, Reiter's syndrome who presented to the ER with progressive left leg swelling from the thigh down to his feet that he first noticed early yesterday.  The swelling has persisted and getting worse.  No trauma.  Is also painful especially when he tries to walk.  Patient seen in the ER and the leg is swollen warm and tender.  Bedside Doppler ultrasound suggested possible DVT.  Patient is scheduled to have vascular studies.  Scan also shows possible recurrence of his prostate cancer.  Patient has been initiated on heparin  and being admitted to the hospital pending formal Doppler studies tomorrow.  No evidence of PE on CT scan.  Review of Systems: As mentioned in the history of present illness. All other systems reviewed and are negative. Past Medical History:  Diagnosis Date   Bladder calculus    CAD (coronary artery disease)    non obs    CKD (chronic kidney disease), stage III (HCC)    " i have 35-40% of my kidney function"; mgd by pcp    COLONIC POLYPS, HX OF 12/02/2008   DIABETES MELLITUS, TYPE II 07/19/2007   Dyspnea    reports sob over the last 2 years , underwent cath  in feb 2020 , reports cath was clean , referred to pulmonologist that stopped his Actos  and encouraged weight loss  ; reports today minimum improvement in breathing , appt with pulm in June 2020   Erectile dysfunction    GERD (gastroesophageal reflux disease)    Glaucoma    GOUT  07/19/2007   History of kidney stones    HYPERLIPIDEMIA 12/02/2008   HYPERTENSION 07/19/2007   Kidney stones    Prostate cancer (HCC) 2007   Reiter syndrome    incomplete   TIA (transient ischemic attack)    Tubular adenoma of colon    Vitamin D  deficiency    Past Surgical History:  Procedure Laterality Date   bladder neck dilation     CATARACT EXTRACTION     both eyes- 2015   COLONOSCOPY  11/15/2021   12-11-14-polyps   CYSTOSCOPY N/A 06/11/2018   Procedure: CYSTOSCOPY WITH REMOVAL OF BLADDER CALCULUS;  Surgeon: Florencio Hunting, MD;  Location: [facility];  Service: Urology;  Laterality: N/A;   ELBOW SURGERY     bone spur - right   LEFT HEART CATH AND CORONARY ANGIOGRAPHY N/A 03/19/2018   Procedure: LEFT HEART CATH AND CORONARY ANGIOGRAPHY;  Surgeon: Arty Binning, MD;  Location: [facility];  Service: Cardiovascular;  Laterality: N/A;   ORIF ANKLE FRACTURE Left 05/17/2015   Procedure: OPEN REDUCTION INTERNAL FIXATION (ORIF) ANKLE FRACTURE;  Surgeon: Jasmine Mesi, MD;  Location: [facility];  Service: Orthopedics;  Laterality: Left;   PROSTATECTOMY  2007   URINARY SPHINCTER IMPLANT N/A 05/21/2019   Procedure: IMPLANTATION OF ARTIFICIAL URINARY SPHINCTER CYSTOSCOPY;  Surgeon: Erman Hayward, MD;  Location: [facility];  Service: Urology;  Laterality: N/A;   VASECTOMY     Social History:  reports that he quit  smoking about 45 years ago. His smoking use included cigarettes. He started smoking about 75 years ago. He has a 30 pack-year smoking history. He has never used smokeless tobacco. He reports that he does not currently use alcohol . He reports that he does not use drugs.  Allergies  Allergen Reactions   Ace Inhibitors Swelling    Tongue swelling. Reaction to benazepril only.     Family History  Problem Relation Age of Onset   Cancer Sister        metastatic colon ca   Colon cancer Neg Hx    Esophageal cancer Neg Hx    Rectal cancer Neg Hx    Stomach cancer Neg Hx     Lung disease Neg Hx    Colon polyps Neg Hx    Pancreatic cancer Neg Hx     Prior to Admission medications   Medication Sig Start Date End Date Taking? Authorizing Provider  amLODipine  (NORVASC ) 5 MG tablet Take 1 tablet (5 mg total) by mouth 2 (two) times daily. 12/30/22   Christel Cousins, MD  atorvastatin  (LIPITOR) 20 MG tablet TAKE 1 TABLET BY MOUTH AT BEDTIME . APPOINTMENT REQUIRED FOR FUTURE REFILLS 06/30/22   Christel Cousins, MD  dorzolamide -timolol  (COSOPT ) 22.3-6.8 MG/ML ophthalmic solution Place 1 drop into both eyes every morning. 09/20/13   [provider]  glipiZIDE  (GLUCOTROL ) 5 MG tablet Take 1 tablet (5 mg total) by mouth daily before breakfast. 12/30/22   Christel Cousins, MD  glucose blood (ACCU-CHEK AVIVA) test strip 1 each by Other route 2 (two) times daily. 06/24/11   Hillery Lown, MD  JARDIANCE  10 MG TABS tablet TAKE 1 TABLET BY MOUTH ONCE DAILY BEFORE BREAKFAST 03/28/23   Christel Cousins, MD  losartan  (COZAAR ) 50 MG tablet Take 1 tablet (50 mg total) by mouth at bedtime. 12/30/22   Christel Cousins, MD  Semaglutide  (RYBELSUS ) 3 MG TABS Take 1 tablet (3 mg total) by mouth daily. 04/10/23   Christel Cousins, MD    Physical Exam: Vitals:   05/28/23 2045 05/28/23 2144 05/28/23 2200 05/28/23 2211  BP: 134/75 134/75 113/69   Pulse: 93 95 92   Resp:  18 (!) 22   Temp:  97.8 F (36.6 C)  98.1 F (36.7 C)  TempSrc:  Oral  Oral  SpO2: 100% 100% 100%   Weight:   103 kg   Height:   5\' 8"  (1.727 m)    Constitutional: Acutely ill looking, NAD, calm, comfortable Eyes: PERRL, lids and conjunctivae normal ENMT: Mucous membranes are moist. Posterior pharynx clear of any exudate or lesions.Normal dentition.  Neck: normal, supple, no masses, no thyromegaly Respiratory: clear to auscultation bilaterally, no wheezing, no crackles. Normal respiratory effort. No accessory muscle use.  Cardiovascular: Regular rate and rhythm, no murmurs / rubs / gallops.  Left lower  extremity edema,. 2+ pedal pulses. No carotid bruits.  Abdomen: no tenderness, no masses palpated. No hepatosplenomegaly. Bowel sounds positive.  Musculoskeletal: Left lower extremity warm, swollen, nonpitting edema good range of motion, no joint swelling or tenderness, Skin: no rashes, lesions, ulcers. No induration Neurologic: CN 2-12 grossly intact. Sensation intact, DTR normal. Strength 5/5 in all 4.  Psychiatric: Normal judgment and insight. Alert and oriented x 3. Normal mood  Data Reviewed:  Temperature 99, blood pressure 116/92, pulse 102, respiratory 24, white count 7.0 hemoglobin 12.6 and CO2 20.  BUN 24 creatinine 1.83 glucose 289.  CT angiogram of the chest showed no acute PE  stranding and soft tissue thickening throughout the omentum CT abdomen pelvis showed cholelithiasis and findings concerning for omental metastatic disease.,  Assessment and Plan:  #1 suspected left lower extremity DVT: Patient initiated on IV heparin .  Will get Doppler ultrasound of the venous system tomorrow to evaluate for DVT.  If so we will plan for oral anticoagulation.  Patient has risk factors for DVT most likely his recurrent prostate cancer.  #2 recurrent prostate cancer: Patient's PCA was elevated.  His being seen by his oncologist with plan to work him up for recurrent cancer.  CT abdomen showed lesions suspicious for omental metastasis probably from recurrent prostate cancer.  Will defer to oncology.  #3 diabetes: Sliding scale insulin .  #4 chronic kidney disease stage III: Appears to be at baseline.  Continue to monitor  #5 subclinical hypothyroidism: Continue to replete.  #6 Essential Hypertension: Blood pressure is controlled.  Continue amlodipine  from home.  Avoid nephrotoxic medications.  #7 history of gout: Will resume allopurinol  when safe.  #8 hyperlipidemia: Continue with statin  #9 morbid obesity: Dietary counseling     Advance Care Planning:   Code Status: Prior full  code  Consults: None  Family Communication: No family at bedside  Severity of Illness: The appropriate patient status for this patient is INPATIENT. Inpatient status is judged to be reasonable and necessary in order to provide the required intensity of service to ensure the patient's safety. The patient's presenting symptoms, physical exam findings, and initial radiographic and laboratory data in the context of their chronic comorbidities is felt to place them at high risk for further clinical deterioration. Furthermore, it is not anticipated that the patient will be medically stable for discharge from the hospital within 2 midnights of admission.   * I certify that at the point of admission it is my clinical judgment that the patient will require inpatient hospital care spanning beyond 2 midnights from the point of admission due to high intensity of service, high risk for further deterioration and high frequency of surveillance required.*  AuthorCarolin Chyle, MD 05/28/2023 10:29 PM  For on call review [email or web address].

## 2023-05-28 NOTE — ED Provider Notes (Signed)
 [name] Provider Note   CSN: [id number] Arrival date & time: 05/28/23  1628    History  Chief Complaint  Patient presents with   Leg Swelling   Testicle Pain    Rodney [name] is a 83 y.o. male history of remote prostate cancer here for evaluation left lower extremity pain and swelling.  Noted yesterday however pain acutely worsened today.  Worse with ambulating.  No known history of PE or DVT.  No recent surgery, immobilization.  He is currently being worked up for active recurrence of his prostate cancer due to rise in his PSA.  He is also noted left testicular pain.  No redness, warmth, masses.  [facility]ney [name] he had prior surgery on his right testicle in 2007.  No dysuria or hematuria.  No fever, nausea, vomiting, chest pain, shortness of breath.   HPI     Home Medications Prior to Admission medications   Medication Sig Start Date End Date Taking? Authorizing Provider  amLODipine  (NORVASC ) 5 MG tablet Take 1 tablet (5 mg total) by mouth 2 (two) times daily. 12/30/22   Christel Cousins, MD  atorvastatin  (LIPITOR) 20 MG tablet TAKE 1 TABLET BY MOUTH AT BEDTIME . APPOINTMENT REQUIRED FOR FUTURE REFILLS 06/30/22   Christel Cousins, MD  dorzolamide -timolol  (COSOPT ) 22.3-6.8 MG/ML ophthalmic solution Place 1 drop into both eyes every morning. 09/20/13   [provider]  glipiZIDE  (GLUCOTROL ) 5 MG tablet Take 1 tablet (5 mg total) by mouth daily before breakfast. 12/30/22   Christel Cousins, MD  glucose blood (ACCU-CHEK AVIVA) test strip 1 each by Other route 2 (two) times daily. 06/24/11   Hillery Lown, MD  JARDIANCE  10 MG TABS tablet TAKE 1 TABLET BY MOUTH ONCE DAILY BEFORE BREAKFAST 03/28/23   Christel Cousins, MD  losartan  (COZAAR ) 50 MG tablet Take 1 tablet (50 mg total) by mouth at bedtime. 12/30/22   Christel Cousins, MD  Semaglutide  (RYBELSUS ) 3 MG TABS Take 1 tablet (3 mg total) by mouth daily. 04/10/23   Christel Cousins, MD       Allergies    Ace inhibitors    Review of Systems   Review of Systems  Constitutional: Negative.   HENT: Negative.    Respiratory: Negative.    Cardiovascular: Negative.   Gastrointestinal: Negative.   Genitourinary: Negative.   Musculoskeletal:        Left lower extremity pain and swelling  Skin:  Positive for color change.  All other systems reviewed and are negative.   Physical Exam Updated Vital Signs BP 117/89 (BP Location: Right Arm)   Pulse (!) 102   Temp 97.9 F (36.6 C) (Oral)   Resp 16   Ht 5\' 8"  (1.727 m)   Wt 103 kg   SpO2 99%   BMI 34.53 kg/m  Physical Exam Vitals and nursing note reviewed.  Constitutional:      General: He is not in acute distress.    Appearance: He is well-developed. He is not ill-appearing, toxic-appearing or diaphoretic.  HENT:     Head: Atraumatic.     Nose: Nose normal.     Mouth/Throat:     Mouth: Mucous membranes are moist.  Eyes:     Pupils: Pupils are equal, round, and reactive to light.  Cardiovascular:     Rate and Rhythm: Normal rate and regular rhythm.     Pulses:          Radial pulses  are 2+ on the right side and 2+ on the left side.       Dorsalis pedis pulses are 2+ on the right side and detected w/ Doppler on the left side.     Heart sounds: Normal heart sounds.  Pulmonary:     Effort: Pulmonary effort is normal. No respiratory distress.     Breath sounds: Normal breath sounds.  Abdominal:     General: Bowel sounds are normal. There is no distension.     Palpations: Abdomen is soft.     Tenderness: There is no abdominal tenderness. There is no right CVA tenderness, left CVA tenderness or guarding.  Genitourinary:    Comments: Chaperone present in room.  Scrotum mild tenderness on left, no erythema, warmth. Musculoskeletal:        General: Swelling and tenderness present. Normal range of motion.     Cervical back: Normal range of motion and neck supple.     Left lower leg: Edema present.     Comments:  Diffuse tenderness left lower extremity from groin distally.  Moderate edema noted from proximal thigh distally to foot with some overlying pink color change to the left extremity compared to right  Skin:    General: Skin is warm and dry.     Capillary Refill: Capillary refill takes less than 2 seconds.  Neurological:     General: No focal deficit present.     Mental Status: He is alert and oriented to person, place, and time.     Cranial Nerves: No cranial nerve deficit.     Motor: No weakness.     Gait: Gait normal.    ED Results / Procedures / Treatments   Labs (all labs ordered are listed, but only abnormal results are displayed) Labs Reviewed  COMPREHENSIVE METABOLIC PANEL WITH GFR - Abnormal; Notable for the following components:      Result Value   CO2 20 (*)    Glucose, Bld 289 (*)    BUN 24 (*)    Creatinine, Ser 1.83 (*)    Total Bilirubin 1.5 (*)    GFR, Estimated 36 (*)    All other components within normal limits  CBC - Abnormal; Notable for the following components:   RBC 3.93 (*)    Hemoglobin 12.6 (*)    MCV 102.0 (*)    All other components within normal limits  URINALYSIS, W/ REFLEX TO CULTURE (INFECTION SUSPECTED)  CBC  COMPREHENSIVE METABOLIC PANEL WITH GFR  HEPARIN  LEVEL (UNFRACTIONATED)  CBC    EKG None  Radiology CT Angio Chest PE W and/or Wo Contrast Result Date: 05/28/2023 CLINICAL DATA:  History of prostate cancer, groin pain, lower extremity edema from thigh down to feet since yesterday. Left testicle pain but no swelling. PE suspected. EXAM: CT ANGIOGRAPHY CHEST CT ABDOMEN AND PELVIS WITH CONTRAST TECHNIQUE: Multidetector CT imaging of the chest was performed using the standard protocol during bolus administration of intravenous contrast. Multiplanar CT image reconstructions and MIPs were obtained to evaluate the vascular anatomy. Multidetector CT imaging of the abdomen and pelvis was performed using the standard protocol during bolus  administration of intravenous contrast. RADIATION DOSE REDUCTION: This exam was performed according to the departmental dose-optimization program which includes automated exposure control, adjustment of the mA and/or kV according to patient size and/or use of iterative reconstruction technique. CONTRAST:  75mL OMNIPAQUE  IOHEXOL  350 MG/ML SOLN COMPARISON:  Scrotal ultrasound earlier today; PET/CT 10/12/2022; CT abdomen pelvis 08/10/2020 FINDINGS: CTA CHEST FINDINGS Cardiovascular: No pericardial  effusion. Coronary artery and aortic atherosclerotic calcification. Normal caliber thoracic aorta. Negative for acute pulmonary embolism. Mediastinum/Nodes: Small hiatal hernia. Trachea is patent. Normal thyroid . No thoracic adenopathy. Lungs/Pleura: No focal consolidation, pleural effusion, or pneumothorax. Atelectasis in the lingula. Musculoskeletal: No acute fracture or suspicious osseous lesion. Review of the MIP images confirms the above findings. CT ABDOMEN and PELVIS FINDINGS Hepatobiliary: Cholelithiasis. No evidence of acute cholecystitis. Unremarkable liver and biliary tree. Pancreas: Fatty atrophy.  No acute abnormality. Spleen: Unremarkable. Adrenals/Urinary Tract: Normal adrenal glands. Right renal cyst not requiring follow-up. No urinary calculi or hydronephrosis. Unremarkable bladder. Stomach/Bowel: Normal caliber large and small bowel. No bowel wall thickening. Small hiatal hernia. Stomach is otherwise unremarkable. The appendix is not visualized. Vascular/Lymphatic: Aortic atherosclerosis. No enlarged abdominal or pelvic lymph nodes. Reproductive: Prostatectomy. The abnormality seen on same-day scrotal ultrasound is not visualized via CT. Penile prosthesis with reservoir in the right lower quadrant. Other: Stranding and soft tissue thickening throughout the omentum has slowly increased compared to prior exams and was mildly avid on PET/CT 10/12/2022. No ascites. No free intraperitoneal air. Musculoskeletal:  Subcutaneous edema in the visualized portion of the proximal left thigh. No acute fracture or destructive osseous lesion. Review of the MIP images confirms the above findings. IMPRESSION: 1. Negative for acute pulmonary embolism. 2. Stranding and soft tissue thickening throughout the omentum has compared to prior exams and was mildly radiotracer avid on PET/CT 10/12/2022. 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 3. Cholelithiasis. 4. Nonspecific subcutaneous edema in the visualized portion of the proximal left thigh. 5. Aortic Atherosclerosis (ICD10-I70.0). Electronically Signed   By: Rozell Cornet M.D.   On: 05/28/2023 20:55   CT ABDOMEN PELVIS W CONTRAST Result Date: 05/28/2023 CLINICAL DATA:  History of prostate cancer, groin pain, lower extremity edema from thigh down to feet since yesterday. Left testicle pain but no swelling. PE suspected. EXAM: CT ANGIOGRAPHY CHEST CT ABDOMEN AND PELVIS WITH CONTRAST TECHNIQUE: Multidetector CT imaging of the chest was performed using the standard protocol during bolus administration of intravenous contrast. Multiplanar CT image reconstructions and MIPs were obtained to evaluate the vascular anatomy. Multidetector CT imaging of the abdomen and pelvis was performed using the standard protocol during bolus administration of intravenous contrast. RADIATION DOSE REDUCTION: This exam was performed according to the departmental dose-optimization program which includes automated exposure control, adjustment of the mA and/or kV according to patient size and/or use of iterative reconstruction technique. CONTRAST:  75mL OMNIPAQUE  IOHEXOL  350 MG/ML SOLN COMPARISON:  Scrotal ultrasound earlier today; PET/CT 10/12/2022; CT abdomen pelvis 08/10/2020 FINDINGS: CTA CHEST FINDINGS Cardiovascular: No pericardial effusion. Coronary artery and aortic atherosclerotic calcification. Normal caliber thoracic aorta. Negative for acute pulmonary embolism. Mediastinum/Nodes: Small  hiatal hernia. Trachea is patent. Normal thyroid . No thoracic adenopathy. Lungs/Pleura: No focal consolidation, pleural effusion, or pneumothorax. Atelectasis in the lingula. Musculoskeletal: No acute fracture or suspicious osseous lesion. Review of the MIP images confirms the above findings. CT ABDOMEN and PELVIS FINDINGS Hepatobiliary: Cholelithiasis. No evidence of acute cholecystitis. Unremarkable liver and biliary tree. Pancreas: Fatty atrophy.  No acute abnormality. Spleen: Unremarkable. Adrenals/Urinary Tract: Normal adrenal glands. Right renal cyst not requiring follow-up. No urinary calculi or hydronephrosis. Unremarkable bladder. Stomach/Bowel: Normal caliber large and small bowel. No bowel wall thickening. Small hiatal hernia. Stomach is otherwise unremarkable. The appendix is not visualized. Vascular/Lymphatic: Aortic atherosclerosis. No enlarged abdominal or pelvic lymph nodes. Reproductive: Prostatectomy. The abnormality seen on same-day scrotal ultrasound is not visualized via CT. Penile prosthesis with reservoir in the right  lower quadrant. Other: Stranding and soft tissue thickening throughout the omentum has slowly increased compared to prior exams and was mildly avid on PET/CT 10/12/2022. No ascites. No free intraperitoneal air. Musculoskeletal: Subcutaneous edema in the visualized portion of the proximal left thigh. No acute fracture or destructive osseous lesion. Review of the MIP images confirms the above findings. IMPRESSION: 1. Negative for acute pulmonary embolism. 2. Stranding and soft tissue thickening throughout the omentum has compared to prior exams and was mildly radiotracer avid on PET/CT 10/12/2022. 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 3. Cholelithiasis. 4. Nonspecific subcutaneous edema in the visualized portion of the proximal left thigh. 5. Aortic Atherosclerosis (ICD10-I70.0). Electronically Signed   By: Rozell Cornet M.D.   On: 05/28/2023 20:55   US   SCROTUM W/DOPPLER Result Date: 05/28/2023 CLINICAL DATA:  Left testicle pain EXAM: SCROTAL ULTRASOUND DOPPLER ULTRASOUND OF THE TESTICLES TECHNIQUE: Complete ultrasound examination of the testicles, epididymis, and other scrotal structures was performed. Color and spectral Doppler ultrasound were also utilized to evaluate blood flow to the testicles. COMPARISON:  PET CT 10/12/2022 FINDINGS: Right testicle Measurements: 3.8 x 1.9 x 3 cm. Abnormal in appearance, heterogenous echotexture, focal heterogeneity measuring 3 x 1.5 x 2.4 cm. On cine clips, soft tissue echogenicity present within the scrotum. Left testicle Measurements:  3.2 x 1.7 x 2.5 cm.  Small cyst measuring 4 mm Right epididymis:  Poorly demonstrated Left epididymis:  Normal in size and appearance. Hydrocele:  None visualized. Varicocele:  None visualized. Pulsed Doppler interrogation of both testes demonstrates normal low resistance arterial and venous waveforms bilaterally. IMPRESSION: 1. Negative for left testicular torsion. 2. Abnormal appearance of the right testis and scrotum. Heterogeneous appearing right testicle with focal hypoechoic area measuring up to 3 cm, question due to remote insult, though slightly masslike on some of the images. Abnormal hypoechoic tissue or material within the scrotum potentially postsurgical in nature, correlate with physical exam and surgical history. Urology follow-up with short-term sonographic follow-up if deemed clinically appropriate. Electronically Signed   By: Esmeralda Hedge M.D.   On: 05/28/2023 19:45   Procedures .Critical Care  Performed by: Dickson Founds, PA-C Authorized by: Dickson Founds, PA-C   Critical care provider statement:    Critical care time (minutes):  35   Critical care was necessary to treat or prevent imminent or life-threatening deterioration of the following conditions:  Circulatory failure   Critical care was time spent personally by me on the following activities:   Development of treatment plan with patient or surrogate, discussions with consultants, evaluation of patient's response to treatment, examination of patient, ordering and review of laboratory studies, ordering and review of radiographic studies, ordering and performing treatments and interventions, pulse oximetry, re-evaluation of patient's condition and review of old charts     Medications Ordered in ED Medications  amLODipine  (NORVASC ) tablet 5 mg (has no administration in time range)  insulin  aspart (novoLOG ) injection 0-15 Units (has no administration in time range)  insulin  aspart (novoLOG ) injection 0-5 Units (has no administration in time range)  morphine  (PF) 2 MG/ML injection 2 mg (has no administration in time range)  ondansetron  (ZOFRAN ) tablet 4 mg (has no administration in time range)    Or  ondansetron  (ZOFRAN ) injection 4 mg (has no administration in time range)  heparin  bolus via infusion 4,500 Units (has no administration in time range)  heparin  ADULT infusion 100 units/mL (25000 units/250mL) (has no administration in time range)  iohexol  (OMNIPAQUE ) 350 MG/ML injection 75 mL (75  mLs Intravenous Contrast Given 05/28/23 2021)   ED Course/ Medical Decision Making/ A&P   83 year old here for evaluation of left lower extremity swelling.  Noted yesterday.  He has pain to his leg worse with walking on the leg.  Swelling goes from the groin distally.  He has intact pulses.  Also admits to some left scrotal pain without erythema, edema.  He is urinating without difficulty.  He does have history of prostate cancer s/p surgery.  He has no known mets however was told recently that his PSA level is increasing and there is concern for reoccurrence.  No chest pain or shortness of breath.  He is without tachycardia, tachypnea or hypoxia.  Will plan on ultrasound, labs and reassess.  Labs and imaging personally viewed and interpreted:  CBC without leukocytosis Metabolic panel creatinine 1.83,  T. bili 1.5 Ultrasound scrotum left without significant findings, right with abnormal findings, possible postsurgical changes correlate clinically   2 different secretaries have paged vascular tech x 3 without answer.  Given imaging has not been performed despite being put in within timeframe they were here.  Ultimately formal ultrasound will not be done.  Attending, Dr. Leida Puna at bedside.  Performed bedside ultrasound.  Appears to have noncompressible vasculature diffuse left lower extremity suspect likely due to clot burden also with diffuse.  Will plan on CT chest and abdomen to look at [name] as well as PE  Patient reassessed.  Discussed labs and imaging.  CTA negative for PE.  CT abdomen pelvis does show concern for omental metastatic disease.   Given leg appearance with non compressible vasculature to left lower extremity we will consult with vascular surgery to see if this is a patient that be worthwhile to keep overnight on heparin  until we get official ultrasound tomorrow versus DC home with dose of Lovenox  here and have him follow-up for ultrasound in the a.m.  CONSULT with Dr. Susi Eric with vascular surgery.  Recommend heparin  formal ultrasound tomorrow.  Discussed with patient and family in room.  Agreeable for admission.  We did discuss CT scan with concern for metastatic cancer.  No history of recent head trauma, spontaneous head bleeds, rectal bleeding, no contraindication for heparin .  CONSULT Garba with medicine who is agreeable to evaluate patient for admission  Patient started on heparin .  The patient appears reasonably stabilized for admission considering the current resources, flow, and capabilities available in the ED at this time, and I doubt any other [name] requiring further screening and/or treatment in the ED prior to admission.                                 Medical Decision Making Amount and/or Complexity of Data Reviewed Independent Historian: spouse External Data  Reviewed: labs, radiology, ECG and notes. Labs: ordered. Decision-making details documented in ED Course. Radiology: ordered and independent interpretation performed. Decision-making details documented in ED Course.  Risk OTC drugs. Prescription drug management. Parenteral controlled substances. Decision regarding hospitalization. Diagnosis or treatment significantly limited by social determinants of health.         Final Clinical Impression(s) / ED Diagnoses Final diagnoses:  Leg swelling  Acute deep vein thrombosis (DVT) of proximal vein of left lower extremity (HCC)  Metastatic malignant neoplasm, unspecified site [facility])    Rx / DC Orders ED Discharge Orders     None         Julie-Ann Vanmaanen A, PA-C 05/28/23 2303  Almond Army, MD 05/29/23 [phone]

## 2023-05-28 NOTE — ED Triage Notes (Signed)
 Pt c.o left leg swelling from his thigh down to his feet since yesterday. Pitting edema noted. Pt also c.o left testicle pain but no swelling. Hx of prostate cancer. Denies SOB/CP.

## 2023-05-29 ENCOUNTER — Inpatient Hospital Stay (HOSPITAL_COMMUNITY)

## 2023-05-29 ENCOUNTER — Encounter (HOSPITAL_COMMUNITY)
Admission: EM | Disposition: A | Discharge status: Home / Self Care | Payer: Self-pay | Source: Home / Self Care | Attending: Obstetrics and Gynecology

## 2023-05-29 DIAGNOSIS — M79662 Pain in left lower leg: Secondary | ICD-10-CM

## 2023-05-29 DIAGNOSIS — I82462 Acute embolism and thrombosis of left calf muscular vein: Secondary | ICD-10-CM | POA: Diagnosis not present

## 2023-05-29 DIAGNOSIS — I824Y2 Acute embolism and thrombosis of unspecified deep veins of left proximal lower extremity: Secondary | ICD-10-CM

## 2023-05-29 HISTORY — PX: LOWER EXTREMITY VENOGRAPHY: CATH118253

## 2023-05-29 HISTORY — PX: PERIPHERAL VASCULAR THROMBECTOMY: CATH118306

## 2023-05-29 LAB — GLUCOSE, CAPILLARY
Glucose-Capillary: 174 mg/dL — ABNORMAL HIGH (ref 70–99)
Glucose-Capillary: 266 mg/dL — ABNORMAL HIGH (ref 70–99)
Glucose-Capillary: 204 mg/dL — ABNORMAL HIGH (ref 70–99)
Glucose-Capillary: 188 mg/dL — ABNORMAL HIGH (ref 70–99)
Glucose-Capillary: 198 mg/dL — ABNORMAL HIGH (ref 70–99)

## 2023-05-29 LAB — COMPREHENSIVE METABOLIC PANEL WITH GFR
ALT: 17 U/L (ref 0–44)
AST: 11 U/L — ABNORMAL LOW (ref 15–41)
Albumin: 3.4 g/dL — ABNORMAL LOW (ref 3.5–5.0)
Alkaline Phosphatase: 77 U/L (ref 38–126)
Anion gap: 6 (ref 5–15)
BUN: 20 mg/dL (ref 8–23)
CO2: 20 mmol/L — ABNORMAL LOW (ref 22–32)
Calcium: 8.5 mg/dL — ABNORMAL LOW (ref 8.9–10.3)
Chloride: 110 mmol/L (ref 98–111)
Creatinine, Ser: 1.59 mg/dL — ABNORMAL HIGH (ref 0.61–1.24)
GFR, Estimated: 43 mL/min — ABNORMAL LOW (ref >60)
Glucose, Bld: 175 mg/dL — ABNORMAL HIGH (ref 70–99)
Potassium: 4.1 mmol/L (ref 3.5–5.1)
Sodium: 136 mmol/L (ref 135–145)
Total Bilirubin: 1.3 mg/dL — ABNORMAL HIGH (ref 0.0–1.2)
Total Protein: 6.2 g/dL — ABNORMAL LOW (ref 6.5–8.1)

## 2023-05-29 LAB — CBC
HCT: 34.9 % — ABNORMAL LOW (ref 39.0–52.0)
Hemoglobin: 11.7 g/dL — ABNORMAL LOW (ref 13.0–17.0)
MCH: 33 pg (ref 26.0–34.0)
MCHC: 33.5 g/dL (ref 30.0–36.0)
MCV: 98.3 fL (ref 80.0–100.0)
Platelets: 136 10*3/uL — ABNORMAL LOW (ref 150–400)
RBC: 3.55 MIL/uL — ABNORMAL LOW (ref 4.22–5.81)
RDW: 13.8 % (ref 11.5–15.5)
WBC: 6.9 10*3/uL (ref 4.0–10.5)
nRBC: 0 % (ref 0.0–0.2)

## 2023-05-29 LAB — HEPARIN LEVEL (UNFRACTIONATED): Heparin Unfractionated: 0.62 [IU]/mL (ref 0.30–0.70)

## 2023-05-29 MED ORDER — MIDAZOLAM HCL 2 MG/2ML IJ SOLN
INTRAMUSCULAR | Status: AC
  Filled 2023-05-29: qty 2

## 2023-05-29 MED ORDER — FENTANYL CITRATE (PF) 100 MCG/2ML IJ SOLN
INTRAMUSCULAR | Status: AC
  Filled 2023-05-29: qty 2

## 2023-05-29 MED ORDER — HYDRALAZINE HCL 20 MG/ML IJ SOLN: 5.0000 mg | INTRAMUSCULAR | Status: DC | PRN

## 2023-05-29 MED ORDER — MIDAZOLAM HCL 2 MG/2ML IJ SOLN
INTRAMUSCULAR | Status: DC | PRN
  Administered 2023-05-29: 1 mg via INTRAVENOUS

## 2023-05-29 MED ORDER — SODIUM CHLORIDE 0.9% FLUSH: 3.0000 mL | INTRAVENOUS | Status: DC | PRN

## 2023-05-29 MED ORDER — HEPARIN (PORCINE) IN NACL 1000-0.9 UT/500ML-% IV SOLN
INTRAVENOUS | Status: DC | PRN
  Administered 2023-05-29 (×3): 500 mL

## 2023-05-29 MED ORDER — SODIUM CHLORIDE 0.9% FLUSH
3.0000 mL | Freq: Two times a day (BID) | INTRAVENOUS | Status: DC
  Administered 2023-05-29 – 2023-05-30 (×2): 3 mL via INTRAVENOUS

## 2023-05-29 MED ORDER — IODIXANOL 320 MG/ML IV SOLN
INTRAVENOUS | Status: DC | PRN
  Administered 2023-05-29: 20 mL

## 2023-05-29 MED ORDER — LABETALOL HCL 5 MG/ML IV SOLN: 10.0000 mg | INTRAVENOUS | Status: DC | PRN

## 2023-05-29 MED ORDER — FENTANYL CITRATE (PF) 100 MCG/2ML IJ SOLN
INTRAMUSCULAR | Status: DC | PRN
  Administered 2023-05-29: 25 ug via INTRAVENOUS

## 2023-05-29 MED ORDER — HEPARIN (PORCINE) 25000 UT/250ML-% IV SOLN
1350.0000 [IU]/h | INTRAVENOUS | Status: DC
  Administered 2023-05-29 (×2): 1500 [IU]/h via INTRAVENOUS
  Filled 2023-05-29: qty 250

## 2023-05-29 MED ORDER — HEPARIN SODIUM (PORCINE) 1000 UNIT/ML IJ SOLN
INTRAMUSCULAR | Status: AC
  Filled 2023-05-29: qty 10

## 2023-05-29 MED ORDER — HEPARIN SODIUM (PORCINE) 1000 UNIT/ML IJ SOLN
INTRAMUSCULAR | Status: DC | PRN
  Administered 2023-05-29: 6000 [IU] via INTRAVENOUS

## 2023-05-29 MED ORDER — LIDOCAINE HCL (PF) 1 % IJ SOLN
INTRAMUSCULAR | Status: DC | PRN
  Administered 2023-05-29: 10 mL

## 2023-05-29 MED ORDER — SODIUM CHLORIDE 0.9 % IV SOLN: 250.0000 mL | INTRAVENOUS | Status: DC | PRN

## 2023-05-29 MED ORDER — LIDOCAINE HCL (PF) 1 % IJ SOLN
INTRAMUSCULAR | Status: AC
  Filled 2023-05-29: qty 30

## 2023-05-29 NOTE — Progress Notes (Signed)
 PROGRESS NOTE    Rodney [name]  ZOX:[id number] DOB: 20-May-1940 DOA: 05/28/2023 PCP: Christel Cousins, MD  Outpatient Specialists: urology    Brief Narrative:   From admission h and p  Rodney [name] is a 83 y.o. male with medical history significant of prostate cancer status posttreatment and recurrence, type 2 diabetes, nephrolithiasis, erectile dysfunctions, essential hypertension, hyperlipidemia, coronary artery disease, Reiter's syndrome who presented to the ER with progressive left leg swelling from the thigh down to his feet that he first noticed early yesterday.  The swelling has persisted and getting worse.  No trauma.  Is also painful especially when he tries to walk.  Patient seen in the ER and the leg is swollen warm and tender.  Bedside Doppler ultrasound suggested possible DVT.  Patient is scheduled to have vascular studies.  Scan also shows possible recurrence of his prostate cancer.  Patient has been initiated on heparin  and being admitted to the hospital pending formal Doppler studies tomorrow.  No evidence of PE on CT scan.   Assessment & Plan:   Principal Problem:   Acute deep vein thrombosis (DVT) of calf muscle vein of left lower extremity (HCC) Active Problems:   Type 2 diabetes mellitus with stage 3 chronic kidney disease (HCC)   Dyslipidemia   Gout   Essential hypertension   Chronic kidney disease (CKD), stage III (moderate) (HCC)   Malignant neoplasm of prostate (HCC)   Morbid (severe) obesity due to excess calories (HCC) complicated by hbp/dm   Subclinical hypothyroidism  # Left leg DVT Extensive clot burden. No PE on CTA - continue heparin  - vascular surgery eval, may need thrombectomy  # Prostate cancer # Omental soft tissue thickening S/p surgery and then radiation, currently monitored by urology.  - will ask urology to see, may need oncology consult and/or gen surg or IR consult for biopsy  # Urinary sphincter implant Likely what's  responsible for abnormal read on scrotal u/s - f/u urology  # T2DM - SSI  # CKD 3 Appears stable - monitor  # HTN Controlled - cont home amlodipine  - home losartan  on hold  DVT prophylaxis: IV heparin  Code Status: full Family Communication: wife updated @ bedside 5/5  Level of care: Progressive Cardiac Status is: Inpatient Remains inpatient appropriate because: ongoing inpt w/u    Consultants:  Vascular surgery, urology  Procedures: See above  Antimicrobials:  none    Subjective: Reports ongoing left leg swelling  Objective: Vitals:   05/29/23 1615 05/29/23 1630 05/29/23 1645 05/29/23 1700  BP: 136/73 131/71 127/67 (!) 138/101  Pulse: 86 85 87 89  Resp: 18 17 17 14   Temp:      TempSrc:      SpO2: 97% 94% 95% 97%  Weight:      Height:        Intake/Output Summary (Last 24 hours) at 05/29/2023 1727 Last data filed at 05/29/2023 0437 Gross per 24 hour  Intake 252.02 ml  Output 400 ml  Net -147.98 ml   Filed Weights   05/28/23 2200  Weight: 103 kg    Examination:  General exam: Appears calm and comfortable  Respiratory system: Clear to auscultation. Respiratory effort normal. Cardiovascular system: S1 & S2 heard, RRR.   Gastrointestinal system: Abdomen is nondistended, soft and nontender.   Central nervous system: Alert and oriented. No focal neurological deficits. Extremities: left leg swelling Skin: No rashes, lesions or ulcers Psychiatry: Judgement and insight appear normal. Mood & affect appropriate.  Data Reviewed: I have personally reviewed following labs and imaging studies  CBC: Recent Labs  Lab 05/28/23 1642 05/29/23 0547  WBC 7.0 6.9  HGB 12.6* 11.7*  HCT 40.1 34.9*  MCV 102.0* 98.3  PLT 166 136*   Basic Metabolic Panel: Recent Labs  Lab 05/28/23 1642 05/29/23 0547  NA 136 136  K 4.5 4.1  CL 108 110  CO2 20* 20*  GLUCOSE 289* 175*  BUN 24* 20  CREATININE 1.83* 1.59*  CALCIUM  8.9 8.5*   GFR: Estimated  Creatinine Clearance: 41.6 mL/min (A) (by C-G formula based on SCr of 1.59 mg/dL (H)). Liver Function Tests: Recent Labs  Lab 05/28/23 1642 05/29/23 0547  AST 24 11*  ALT 16 17  ALKPHOS 85 77  BILITOT 1.5* 1.3*  PROT 6.7 6.2*  ALBUMIN 3.8 3.4*   No results for input(s): "LIPASE", "AMYLASE" in the last 168 hours. No results for input(s): "AMMONIA" in the last 168 hours. Coagulation Profile: No results for input(s): "INR", "PROTIME" in the last 168 hours. Cardiac Enzymes: No results for input(s): "CKTOTAL", "CKMB", "CKMBINDEX", "TROPONINI" in the last 168 hours. BNP (last 3 results) No results for input(s): "PROBNP" in the last 8760 hours. HbA1C: No results for input(s): "HGBA1C" in the last 72 hours. CBG: Recent Labs  Lab 05/28/23 2327 05/29/23 0846 05/29/23 1242 05/29/23 1634  GLUCAP 185* 174* 266* 204*   Lipid Profile: No results for input(s): "CHOL", "HDL", "LDLCALC", "TRIG", "CHOLHDL", "LDLDIRECT" in the last 72 hours. Thyroid  Function Tests: No results for input(s): "TSH", "T4TOTAL", "FREET4", "T3FREE", "THYROIDAB" in the last 72 hours. Anemia Panel: No results for input(s): "VITAMINB12", "FOLATE", "FERRITIN", "TIBC", "IRON", "RETICCTPCT" in the last 72 hours. Urine analysis:    Component Value Date/Time   COLORURINE STRAW (A) 06/29/2018 0246   APPEARANCEUR CLEAR 06/29/2018 0246   LABSPEC 1.008 06/29/2018 0246   LABSPEC 1.015 01/29/2014 1508   PHURINE 6.0 06/29/2018 0246   GLUCOSEU NEGATIVE 06/29/2018 0246   GLUCOSEU Negative 01/29/2014 1508   HGBUR SMALL (A) 06/29/2018 0246   BILIRUBINUR NEG 04/10/2023 0938   BILIRUBINUR Negative 01/29/2014 1508   KETONESUR NEGATIVE 06/29/2018 0246   PROTEINUR Negative 04/10/2023 0938   PROTEINUR NEGATIVE 06/29/2018 0246   UROBILINOGEN 0.2 04/10/2023 0938   UROBILINOGEN 0.2 01/29/2014 1508   NITRITE NEG 04/10/2023 0938   NITRITE NEGATIVE 06/29/2018 0246   LEUKOCYTESUR Negative 04/10/2023 0938   LEUKOCYTESUR TRACE (A)  06/29/2018 0246   LEUKOCYTESUR Trace 01/29/2014 1508   Sepsis Labs: @LABRCNTIP (procalcitonin:4,lacticidven:4)  )No results found for this or any previous visit (from the past 240 hours).       Radiology Studies: PERIPHERAL VASCULAR CATHETERIZATION Result Date: 05/29/2023 Images from the original result were not included. Patient name: MELVINE PHANEUF MRN: [id number] DOB: 21-Jun-1940 Sex: male 05/29/2023 Pre-operative Diagnosis: Extensive left lower extremity DVT Post-operative diagnosis:  Same, May-Thurner syndrome Surgeon:  Sarajane Cumming. Vikki Graves, MD Procedure Performed: 1.  Ultrasound-guided cannulation left small saphenous vein 2.  Left lower extremity venogram from popliteal vein including left popliteal vein, left femoral vein, left common femoral vein, left external and common iliac veins and IVC 3.  Intravascular ultrasound of left popliteal vein, left femoral vein, left common femoral vein, left external and common iliac veins and IVC 4.  Mechanical thrombectomy of left popliteal vein, left femoral vein, left common femoral vein, left external and common iliac veins and IVC using Inari ClotTriever 5.  Stent of left common iliac vein with 14 x 80 mm Medtronic Abre postdilated with 12 mm  balloon 6.  Moderate sedation with fentanyl  and Versed  for 52 minutes Indications: 83 year old male with extensive left lower extremity DVT without previous history of DVT.  This has been present for approximately 3 days and he has significant pain.  We have discussed his options being continued anticoagulation versus mechanical thrombectomy versus a watch and wait approach.  Patient has elected for mechanical thrombectomy we have discussed the risk benefits alternatives and he has agreed to proceed. Findings: There was extensive clot burden on the left popliteal vein all the way through to the IVC where there was a time of clot sticking above what appeared to be stenosed approximately 90% left common iliac vein.   Significant clot was removed on 5 separate passes of an artery device.  After mechanical thrombectomy there was a notable tight stenosis from May Thurner configuration and this was stented 0% residual stenosis postdilated with a 12 mm balloon.  Completion venogram and IVUS demonstrated no residual thrombus and no residual stenosis with brisk flow from the left popliteal vein all the way through the left femoral, left common femoral vein and external and common iliac veins into the IVC.  Procedure:  The patient was identified in the holding area and taken to room 8.  The patient was then placed prone on the table and prepped and draped in the usual sterile fashion.  A time out was called.  Ultrasound was used to evaluate the left small saphenous vein and this was noted to be patent however minimally compressible.  The area was anesthetized 1% lidocaine  and cannulated the small saphenous vein with a micropuncture needle followed by wire and a sheath.  I then administered additional 6000 units of heparin .  A Glidewire advantage wire was initially placed followed by a 8 [place] sheath and we selected the IVC using Berenstein catheter and placed the Glidewire advantage into the right IJ under fluoroscopic guidance.  We then began with intravascular ultrasound extending from the popliteal vein on the left, including the left femoral vein, left common femoral vein, left common and external iliac veins and IVC with the above findings of thrombus with the tunneling into the IVC and all the way down to the popliteal vein.  We then placed the Inari sheath and performed mechanical thrombectomy using Inari ClotTriever on 5 separate passes until the fifth 1 was clean.  We then performed IVUS again which demonstrated stenosis at the common iliac confluence to the IVC this was primarily balloon dilated with a 12 mm balloon reIVUS and then stented with 14 x 80 mm self-expanding stent that was postdilated with 12 mm balloon.   Completion IVUS demonstrated no residual stenosis with the stent satisfactorily up to the level of the IVC.  Completion venography was performed from the popliteal all the way up the left lower extremity including the IVC.  Satisfied with this we remove the wire and then remove the sheath and suture-ligated the cannulation site with 4-0 Monocryl suture.  A compressive wrap was placed on the leg.  Patient tolerated seizure without a complication. Contrast: 20 cc Brandon C. Vikki Graves, MD [facility] Office: [phone] Pager: [phone]   VAS US  IVC/ILIAC (VENOUS ONLY) Result Date: 05/29/2023 IVC/ILIAC STUDY Patient Name:  TAUHEED GMEREK  Date of Exam:   05/29/2023 Medical Rec #: [id number]           Accession #:    [id number] Date of Birth: [birth year]  Patient Gender: M Patient Age:   76 years Exam Location:  [facility] Procedure:      VAS US  IVC/ILIAC (VENOUS ONLY) Referring Phys: --------------------------------------------------------------------------------  Indications: Acute DVT of LLE Limitations: Obesity and air/bowel gas.  Comparison Study: No previous exams Performing Technologist: Jody Hill RVT, RDMS  Examination Guidelines: A complete evaluation includes B-mode imaging, spectral Doppler, color Doppler, and power Doppler as needed of all accessible portions of each vessel. Bilateral testing is considered an integral part of a complete examination. Limited examinations for reoccurring indications may be performed as noted.  IVC/Iliac Findings: +----------+------+--------+-----------------------+    IVC    PatentThrombus       Comments         +----------+------+--------+-----------------------+ IVC Prox  patent        patent by color/doppler +----------+------+--------+-----------------------+ IVC Mid   patent        patent by color/doppler +----------+------+--------+-----------------------+ IVC Distalpatent        patent by doppler        +----------+------+--------+-----------------------+  Unable to visualized CIV on this exam +-------------------------+---------+-----------+---------+-----------+--------+            EIV           RT-PatentRT-ThrombusLT-PatentLT-ThrombusComments +-------------------------+---------+-----------+---------+-----------+--------+ External Iliac Vein Prox                                 acute            +-------------------------+---------+-----------+---------+-----------+--------+ External Iliac Vein Mid                                  acute            +-------------------------+---------+-----------+---------+-----------+--------+ External Iliac Vein                                      acute            Distal                                                                    +-------------------------+---------+-----------+---------+-----------+--------+   Summary: IVC/Iliac: There is no evidence of thrombus involving the IVC. There is evidence of acute thrombus involving the left external iliac vein. Unable to visualize CIV on this exam.  *See table(s) above for measurements and observations.  Electronically signed by Angela Kell MD on 05/29/2023 at 4:05:09 PM.    Final    VAS US  LOWER EXTREMITY VENOUS (DVT) (ONLY [facility] & [facility]) Result Date: 05/29/2023  Lower Venous DVT Study Patient Name:  MALAKIA HI [facility]  Date of Exam:   05/29/2023 Medical Rec #: [id number]           Accession #:    [id number] Date of Birth: [birth year]           Patient Gender: M Patient Age:   74 years Exam Location:  [facility] Procedure:      VAS US  LOWER EXTREMITY VENOUS (DVT) Referring Phys: Sudie Ely --------------------------------------------------------------------------------  Indications: Swelling, and Pain.  Performing Technologist: Arlyce Berger RVT, RDMS  Examination  Guidelines: A complete evaluation includes B-mode imaging, spectral Doppler, color Doppler, and power Doppler as needed of all  accessible portions of each vessel. Bilateral testing is considered an integral part of a complete examination. Limited examinations for reoccurring indications may be performed as noted. The reflux portion of the exam is performed with the patient in reverse Trendelenburg.  +-----+---------------+---------+-----------+----------+--------------+ RIGHTCompressibilityPhasicitySpontaneityPropertiesThrombus Aging +-----+---------------+---------+-----------+----------+--------------+ CFV  Full           Yes      Yes                                 +-----+---------------+---------+-----------+----------+--------------+   +---------+---------------+---------+-----------+----------+--------------+ LEFT     CompressibilityPhasicitySpontaneityPropertiesThrombus Aging +---------+---------------+---------+-----------+----------+--------------+ CFV      None           No       No                   Acute          +---------+---------------+---------+-----------+----------+--------------+ SFJ      None                                         Acute          +---------+---------------+---------+-----------+----------+--------------+ FV Prox  None           No       No                   Acute          +---------+---------------+---------+-----------+----------+--------------+ FV Mid   None           No       No                   Acute          +---------+---------------+---------+-----------+----------+--------------+ FV DistalPartial        No       No                   Acute          +---------+---------------+---------+-----------+----------+--------------+ PFV      None           No       No                   Acute          +---------+---------------+---------+-----------+----------+--------------+ POP      Partial        No       Yes                  Acute          +---------+---------------+---------+-----------+----------+--------------+ PTV      Full                                                         +---------+---------------+---------+-----------+----------+--------------+ PERO     Partial        No       No                   Acute          +---------+---------------+---------+-----------+----------+--------------+  Summary: RIGHT: - No evidence of common femoral vein obstruction.   LEFT: - Findings consistent with acute deep vein thrombosis involving the left common femoral vein, SF junction, left femoral vein, left proximal profunda vein, left popliteal vein, and left peroneal veins.  - No cystic structure found in the popliteal fossa.  *See table(s) above for measurements and observations. Electronically signed by Angela Kell MD on 05/29/2023 at 4:04:22 PM.    Final    CT Angio Chest PE W and/or Wo Contrast Result Date: 05/28/2023 CLINICAL DATA:  History of prostate cancer, groin pain, lower extremity edema from thigh down to feet since yesterday. Left testicle pain but no swelling. PE suspected. EXAM: CT ANGIOGRAPHY CHEST CT ABDOMEN AND PELVIS WITH CONTRAST TECHNIQUE: Multidetector CT imaging of the chest was performed using the standard protocol during bolus administration of intravenous contrast. Multiplanar CT image reconstructions and MIPs were obtained to evaluate the vascular anatomy. Multidetector CT imaging of the abdomen and pelvis was performed using the standard protocol during bolus administration of intravenous contrast. RADIATION DOSE REDUCTION: This exam was performed according to the departmental dose-optimization program which includes automated exposure control, adjustment of the mA and/or kV according to patient size and/or use of iterative reconstruction technique. CONTRAST:  75mL OMNIPAQUE  IOHEXOL  350 MG/ML SOLN COMPARISON:  Scrotal ultrasound earlier today; PET/CT 10/12/2022; CT abdomen pelvis 08/10/2020 FINDINGS: CTA CHEST FINDINGS Cardiovascular: No pericardial effusion. Coronary artery and aortic atherosclerotic  calcification. Normal caliber thoracic aorta. Negative for acute pulmonary embolism. Mediastinum/Nodes: Small hiatal hernia. Trachea is patent. Normal thyroid . No thoracic adenopathy. Lungs/Pleura: No focal consolidation, pleural effusion, or pneumothorax. Atelectasis in the lingula. Musculoskeletal: No acute fracture or suspicious osseous lesion. Review of the MIP images confirms the above findings. CT ABDOMEN and PELVIS FINDINGS Hepatobiliary: Cholelithiasis. No evidence of acute cholecystitis. Unremarkable liver and biliary tree. Pancreas: Fatty atrophy.  No acute abnormality. Spleen: Unremarkable. Adrenals/Urinary Tract: Normal adrenal glands. Right renal cyst not requiring follow-up. No urinary calculi or hydronephrosis. Unremarkable bladder. Stomach/Bowel: Normal caliber large and small bowel. No bowel wall thickening. Small hiatal hernia. Stomach is otherwise unremarkable. The appendix is not visualized. Vascular/Lymphatic: Aortic atherosclerosis. No enlarged abdominal or pelvic lymph nodes. Reproductive: Prostatectomy. The abnormality seen on same-day scrotal ultrasound is not visualized via CT. Penile prosthesis with reservoir in the right lower quadrant. Other: Stranding and soft tissue thickening throughout the omentum has slowly increased compared to prior exams and was mildly avid on PET/CT 10/12/2022. No ascites. No free intraperitoneal air. Musculoskeletal: Subcutaneous edema in the visualized portion of the proximal left thigh. No acute fracture or destructive osseous lesion. Review of the MIP images confirms the above findings. IMPRESSION: 1. Negative for acute pulmonary embolism. 2. Stranding and soft tissue thickening throughout the omentum has compared to prior exams and was mildly radiotracer avid on PET/CT 10/12/2022. 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 3. Cholelithiasis. 4. Nonspecific subcutaneous edema in the visualized portion of the proximal left thigh. 5. Aortic  Atherosclerosis (ICD10-I70.0). Electronically Signed   By: Rozell Cornet M.D.   On: 05/28/2023 20:55   CT ABDOMEN PELVIS W CONTRAST Result Date: 05/28/2023 CLINICAL DATA:  History of prostate cancer, groin pain, lower extremity edema from thigh down to feet since yesterday. Left testicle pain but no swelling. PE suspected. EXAM: CT ANGIOGRAPHY CHEST CT ABDOMEN AND PELVIS WITH CONTRAST TECHNIQUE: Multidetector CT imaging of the chest was performed using the standard protocol during bolus administration of intravenous contrast. Multiplanar CT image reconstructions and MIPs were obtained to  evaluate the vascular anatomy. Multidetector CT imaging of the abdomen and pelvis was performed using the standard protocol during bolus administration of intravenous contrast. RADIATION DOSE REDUCTION: This exam was performed according to the departmental dose-optimization program which includes automated exposure control, adjustment of the mA and/or kV according to patient size and/or use of iterative reconstruction technique. CONTRAST:  75mL OMNIPAQUE  IOHEXOL  350 MG/ML SOLN COMPARISON:  Scrotal ultrasound earlier today; PET/CT 10/12/2022; CT abdomen pelvis 08/10/2020 FINDINGS: CTA CHEST FINDINGS Cardiovascular: No pericardial effusion. Coronary artery and aortic atherosclerotic calcification. Normal caliber thoracic aorta. Negative for acute pulmonary embolism. Mediastinum/Nodes: Small hiatal hernia. Trachea is patent. Normal thyroid . No thoracic adenopathy. Lungs/Pleura: No focal consolidation, pleural effusion, or pneumothorax. Atelectasis in the lingula. Musculoskeletal: No acute fracture or suspicious osseous lesion. Review of the MIP images confirms the above findings. CT ABDOMEN and PELVIS FINDINGS Hepatobiliary: Cholelithiasis. No evidence of acute cholecystitis. Unremarkable liver and biliary tree. Pancreas: Fatty atrophy.  No acute abnormality. Spleen: Unremarkable. Adrenals/Urinary Tract: Normal adrenal glands.  Right renal cyst not requiring follow-up. No urinary calculi or hydronephrosis. Unremarkable bladder. Stomach/Bowel: Normal caliber large and small bowel. No bowel wall thickening. Small hiatal hernia. Stomach is otherwise unremarkable. The appendix is not visualized. Vascular/Lymphatic: Aortic atherosclerosis. No enlarged abdominal or pelvic lymph nodes. Reproductive: Prostatectomy. The abnormality seen on same-day scrotal ultrasound is not visualized via CT. Penile prosthesis with reservoir in the right lower quadrant. Other: Stranding and soft tissue thickening throughout the omentum has slowly increased compared to prior exams and was mildly avid on PET/CT 10/12/2022. No ascites. No free intraperitoneal air. Musculoskeletal: Subcutaneous edema in the visualized portion of the proximal left thigh. No acute fracture or destructive osseous lesion. Review of the MIP images confirms the above findings. IMPRESSION: 1. Negative for acute pulmonary embolism. 2. Stranding and soft tissue thickening throughout the omentum has compared to prior exams and was mildly radiotracer avid on PET/CT 10/12/2022. 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 3. Cholelithiasis. 4. Nonspecific subcutaneous edema in the visualized portion of the proximal left thigh. 5. Aortic Atherosclerosis (ICD10-I70.0). Electronically Signed   By: Rozell Cornet M.D.   On: 05/28/2023 20:55   US  SCROTUM W/DOPPLER Result Date: 05/28/2023 CLINICAL DATA:  Left testicle pain EXAM: SCROTAL ULTRASOUND DOPPLER ULTRASOUND OF THE TESTICLES TECHNIQUE: Complete ultrasound examination of the testicles, epididymis, and other scrotal structures was performed. Color and spectral Doppler ultrasound were also utilized to evaluate blood flow to the testicles. COMPARISON:  PET CT 10/12/2022 FINDINGS: Right testicle Measurements: 3.8 x 1.9 x 3 cm. Abnormal in appearance, heterogenous echotexture, focal heterogeneity measuring 3 x 1.5 x 2.4 cm. On cine  clips, soft tissue echogenicity present within the scrotum. Left testicle Measurements:  3.2 x 1.7 x 2.5 cm.  Small cyst measuring 4 mm Right epididymis:  Poorly demonstrated Left epididymis:  Normal in size and appearance. Hydrocele:  None visualized. Varicocele:  None visualized. Pulsed Doppler interrogation of both testes demonstrates normal low resistance arterial and venous waveforms bilaterally. IMPRESSION: 1. Negative for left testicular torsion. 2. Abnormal appearance of the right testis and scrotum. Heterogeneous appearing right testicle with focal hypoechoic area measuring up to 3 cm, question due to remote insult, though slightly masslike on some of the images. Abnormal hypoechoic tissue or material within the scrotum potentially postsurgical in nature, correlate with physical exam and surgical history. Urology follow-up with short-term sonographic follow-up if deemed clinically appropriate. Electronically Signed   By: Esmeralda Hedge M.D.   On: 05/28/2023 19:45  Scheduled Meds:  amLODipine   5 mg Oral BID   insulin  aspart  0-15 Units Subcutaneous TID WC   insulin  aspart  0-5 Units Subcutaneous QHS   Continuous Infusions:  heparin  1,500 Units/hr (05/29/23 1708)     LOS: 1 day     Raymonde Calico, MD [facility]   If 7PM-7AM, please contact night-coverage [email or web address] Password [email or web address] 05/29/2023, 5:27 PM

## 2023-05-29 NOTE — Plan of Care (Signed)
  Problem: Education: Goal: Knowledge of General Education information will improve Description: Including pain rating scale, medication(s)/side effects and non-pharmacologic comfort measures Outcome: Progressing   Problem: Health Behavior/Discharge Planning: Goal: Ability to manage health-related needs will improve Outcome: Progressing   Problem: Clinical Measurements: Goal: Ability to maintain clinical measurements within normal limits will improve Outcome: Progressing Goal: Will remain free from infection Outcome: Progressing Goal: Diagnostic test results will improve Outcome: Progressing Goal: Respiratory complications will improve Outcome: Progressing Goal: Cardiovascular complication will be avoided Outcome: Progressing   Problem: Activity: Goal: Risk for activity intolerance will decrease Outcome: Progressing   Problem: Nutrition: Goal: Adequate nutrition will be maintained Outcome: Progressing   Problem: Coping: Goal: Level of anxiety will decrease Outcome: Progressing   Problem: Elimination: Goal: Will not experience complications related to bowel motility Outcome: Progressing Goal: Will not experience complications related to urinary retention Outcome: Progressing   Problem: Pain Managment: Goal: General experience of comfort will improve and/or be controlled Outcome: Progressing   Problem: Safety: Goal: Ability to remain free from injury will improve Outcome: Progressing   Problem: Skin Integrity: Goal: Risk for impaired skin integrity will decrease Outcome: Progressing   Problem: Education: Goal: Ability to describe self-care measures that may prevent or decrease complications (Diabetes Survival Skills Education) will improve Outcome: Progressing Goal: Individualized Educational Video(s) Outcome: Progressing   Problem: Coping: Goal: Ability to adjust to condition or change in health will improve Outcome: Progressing   Problem: Fluid  Volume: Goal: Ability to maintain a balanced intake and output will improve Outcome: Progressing   Problem: Health Behavior/Discharge Planning: Goal: Ability to identify and utilize available resources and services will improve Outcome: Progressing Goal: Ability to manage health-related needs will improve Outcome: Progressing   Problem: Metabolic: Goal: Ability to maintain appropriate glucose levels will improve Outcome: Progressing   Problem: Nutritional: Goal: Maintenance of adequate nutrition will improve Outcome: Progressing Goal: Progress toward achieving an optimal weight will improve Outcome: Progressing   Problem: Skin Integrity: Goal: Risk for impaired skin integrity will decrease Outcome: Progressing   Problem: Tissue Perfusion: Goal: Adequacy of tissue perfusion will improve Outcome: Progressing   Problem: Health Behavior/Discharge Planning: Goal: Ability to manage health-related needs will improve Outcome: Progressing   Problem: Clinical Measurements: Goal: Ability to maintain clinical measurements within normal limits will improve Outcome: Progressing   Problem: Clinical Measurements: Goal: Diagnostic test results will improve Outcome: Progressing   Problem: Clinical Measurements: Goal: Respiratory complications will improve Outcome: Progressing   Problem: Clinical Measurements: Goal: Cardiovascular complication will be avoided Outcome: Progressing   Problem: Elimination: Goal: Will not experience complications related to bowel motility Outcome: Progressing   Problem: Pain Managment: Goal: General experience of comfort will improve and/or be controlled Outcome: Progressing

## 2023-05-29 NOTE — Plan of Care (Signed)
 I have been attempting to see Rodney [name] between his procedures. He is off the floor again. No acute Urologic needs. Will formally consult tomorrow if he is available.   Alla Ar, NP [facility] Pager: [phone]

## 2023-05-29 NOTE — Consult Note (Incomplete)
 Urology Consult Note   Requesting Attending Physician:  Janeane Mealy, MD Service Providing Consult: Urology  Consulting Attending: Dr. Cathi Cluster   Reason for Consult:  question of metastatic disease and   HPI: Rodney [name] is seen in consultation for reasons noted above at the request of Wouk, Haynes Lips, MD. Pt presented to [facility] ED for progressive left leg swelling x1d.  Ultrasound ultimately revealed DVT occluding the left iliac for which patient is starting on heparin  gtt. CT A/P collected during workup notes stranding and soft tissue thickening throughout the omentum compared to previous PET scan, concerning for omental metastatic disease.  Patient also complained of left testicular pain and scrotal ultrasound question if 3 cm area is being masslike. Urology has been consulted to speak to these findings.  ------------------  Assessment:  83 y.o. male with biochemical reoccurrence of prostate cancer, not found with hypermetabolic omental areas concerning for progressions of mets, and abnormal testicular US .    [facility] Notes:   1) Prostate cancer: He is s/p a non nerve-sparing RLRP on August 22, 2005. He developed a biochemical recurrence in November 2014. He eventually proceeded with salvage radiation therapy under the care of Dr. Lorri Rota in November-December 2015. His PSA continued to increase following radiation indicating systemic disease.   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 He has not had a recurrence since dilation.   Jul 2007: Radical prostatectomy  Nov-Dec 2015: Salvage radiation therapy: 68.4 Gy (Dr. Lorri Rota), PSA 0.51 prior to radiation  May 2019: CT scan - Negative, Bone scan - Negative, PSA 5.23 Aug 2020: Bone scan and CT scan - Negative for metastatic disease (PSA 17.4)  Oct 2023: PSMA PET  scan (PSA 26.9): L3 metastatic lesion and external iliac lymph nodes  Dec 2023: SBRT to L3 met and 10 Gy to left external iliac lymph node/para-aortic lymph node   2) Urinary retention/incontinence: He developed urinary retention in the spring of 2020 and required catheterization. After failing voiding trials, cystoscopy revealed a bladder neck calculus. This was removed in May 2020 along with an underlying hemolock clip. His did develop worsening incontinence postoperatively.   Aug 2020: Restarted PT  Jan 2021: Evaluated by Dr. Clarke Crouch (decision to proceed with AUS)  Apr 2021: AUS placement by Dr. Clarke Crouch     Recommendations: - ***  Case and plan discussed with Dr. Cathi Cluster  Past Medical History: Past Medical History:  Diagnosis Date   Bladder calculus    CAD (coronary artery disease)    non obs    CKD (chronic kidney disease), stage III (HCC)    " i have 35-40% of my kidney function"; mgd by pcp    COLONIC POLYPS, HX OF 12/02/2008   DIABETES MELLITUS, TYPE II 07/19/2007   Dyspnea    reports sob over the last 2 years , underwent cath  in feb 2020 , reports cath was clean , referred to pulmonologist that stopped his Actos  and encouraged weight loss  ; reports today minimum improvement in breathing , appt with pulm in June 2020   Erectile dysfunction    GERD (gastroesophageal reflux disease)    Glaucoma    GOUT 07/19/2007   History of kidney stones    HYPERLIPIDEMIA 12/02/2008   HYPERTENSION 07/19/2007   Kidney stones    Prostate cancer (HCC) 2007   Reiter syndrome  incomplete   TIA (transient ischemic attack)    Tubular adenoma of colon    Vitamin D  deficiency     Past Surgical History:  Past Surgical History:  Procedure Laterality Date   bladder neck dilation     CATARACT EXTRACTION     both eyes- 2015   COLONOSCOPY  11/15/2021   12-11-14-polyps   CYSTOSCOPY N/A 06/11/2018   Procedure: CYSTOSCOPY WITH REMOVAL OF BLADDER CALCULUS;  Surgeon: Florencio Hunting, MD;  Location: [facility];  Service: Urology;  Laterality: N/A;   ELBOW SURGERY     bone spur - right   LEFT HEART CATH AND CORONARY ANGIOGRAPHY N/A 03/19/2018   Procedure: LEFT HEART CATH AND CORONARY ANGIOGRAPHY;  Surgeon: Arty Binning, MD;  Location: [facility];  Service: Cardiovascular;  Laterality: N/A;   ORIF ANKLE FRACTURE Left 05/17/2015   Procedure: OPEN REDUCTION INTERNAL FIXATION (ORIF) ANKLE FRACTURE;  Surgeon: Jasmine Mesi, MD;  Location: [facility];  Service: Orthopedics;  Laterality: Left;   PROSTATECTOMY  2007   URINARY SPHINCTER IMPLANT N/A 05/21/2019   Procedure: IMPLANTATION OF ARTIFICIAL URINARY SPHINCTER CYSTOSCOPY;  Surgeon: Erman Hayward, MD;  Location: [facility];  Service: Urology;  Laterality: N/A;   VASECTOMY      Medication: Current Facility-Administered Medications  Medication Dose Route Frequency Provider Last Rate Last Admin   amLODipine  (NORVASC ) tablet 5 mg  5 mg Oral BID Garba, Mohammad L, MD   5 mg at 05/29/23 0953   heparin  ADULT infusion 100 units/mL (25000 units/250mL)  1,500 Units/hr Intravenous Continuous Davida Espy, MD 15 mL/hr at 05/29/23 0157 1,500 Units/hr at 05/29/23 0157   insulin  aspart (novoLOG ) injection 0-15 Units  0-15 Units Subcutaneous TID WC Davida Espy, MD   8 Units at 05/29/23 1255   insulin  aspart (novoLOG ) injection 0-5 Units  0-5 Units Subcutaneous QHS Garba, Mohammad L, MD       morphine  (PF) 2 MG/ML injection 2 mg  2 mg Intravenous Q2H PRN Davida Espy, MD       ondansetron  (ZOFRAN ) tablet 4 mg  4 mg Oral Q6H PRN Davida Espy, MD       Or   ondansetron  (ZOFRAN ) injection 4 mg  4 mg Intravenous Q6H PRN Davida Espy, MD        Allergies: Allergies  Allergen Reactions   Ace Inhibitors Swelling    Tongue swelling. Reaction to benazepril only.     Social History: Social History   Tobacco Use   Smoking status: Former    Current packs/day: 0.00    Average packs/day: 1 pack/day for  30.0 years (30.0 ttl pk-yrs)    Types: Cigarettes    Start date: 01/25/1948    Quit date: 01/24/1978    Years since quitting: 45.3   Smokeless tobacco: Never  Vaping Use   Vaping status: Never Used  Substance Use Topics   Alcohol  use: Not Currently    Comment: occasionally -socially   Drug use: No    Family History Family History  Problem Relation Age of Onset   Cancer Sister        metastatic colon ca   Colon cancer Neg Hx    Esophageal cancer Neg Hx    Rectal cancer Neg Hx    Stomach cancer Neg Hx    Lung disease Neg Hx    Colon polyps Neg Hx    Pancreatic cancer Neg Hx     ROS   Objective   Vital  signs in last 24 hours: BP 120/78 (BP Location: Right Arm)   Pulse 90   Temp 99.6 F (37.6 C) (Oral)   Resp 18   Ht 5\' 8"  (1.727 m)   Wt 103 kg   SpO2 97%   BMI 34.53 kg/m   Physical Exam General: A&O, resting, appropriate HEENT: [place]/AT Pulmonary: Normal work of breathing Cardiovascular: no cyanosis Abdomen: Soft, NTTP, nondistended GU: *** Neuro: Appropriate, no focal neurological deficits  Most Recent Labs: Lab Results  Component Value Date   WBC 6.9 05/29/2023   HGB 11.7 (L) 05/29/2023   HCT 34.9 (L) 05/29/2023   PLT 136 (L) 05/29/2023    Lab Results  Component Value Date   NA 136 05/29/2023   K 4.1 05/29/2023   CL 110 05/29/2023   CO2 20 (L) 05/29/2023   BUN 20 05/29/2023   CREATININE 1.59 (H) 05/29/2023   CALCIUM  8.5 (L) 05/29/2023    Lab Results  Component Value Date   INR 1.0 03/04/2021     Urine Culture: @[id number] (laburin,org,r9620,r9621)@   IMAGING: VAS US  IVC/ILIAC (VENOUS ONLY) Result Date: 05/29/2023 IVC/ILIAC STUDY Patient Name:  FRANZ ASELTINE  Date of Exam:   05/29/2023 Medical Rec #: [id number]           Accession #:    [id number] Date of Birth: 04-15-1940           Patient Gender: M Patient Age:   10 years Exam Location:  [facility] Procedure:      VAS US  IVC/ILIAC (VENOUS ONLY) Referring Phys:  --------------------------------------------------------------------------------  Indications: Acute DVT of LLE Limitations: Obesity and air/bowel gas.  Comparison Study: No previous exams Performing Technologist: Jody Hill RVT, RDMS  Examination Guidelines: A complete evaluation includes B-mode imaging, spectral Doppler, color Doppler, and power Doppler as needed of all accessible portions of each vessel. Bilateral testing is considered an integral part of a complete examination. Limited examinations for reoccurring indications may be performed as noted.  IVC/Iliac Findings: +----------+------+--------+-----------------------+    IVC    PatentThrombus       Comments         +----------+------+--------+-----------------------+ IVC Prox  patent        patent by color/doppler +----------+------+--------+-----------------------+ IVC Mid   patent        patent by color/doppler +----------+------+--------+-----------------------+ IVC Distalpatent        patent by doppler       +----------+------+--------+-----------------------+  Unable to visualized CIV on this exam +-------------------------+---------+-----------+---------+-----------+--------+            EIV           RT-PatentRT-ThrombusLT-PatentLT-ThrombusComments +-------------------------+---------+-----------+---------+-----------+--------+ External Iliac Vein Prox                                 acute            +-------------------------+---------+-----------+---------+-----------+--------+ External Iliac Vein Mid                                  acute            +-------------------------+---------+-----------+---------+-----------+--------+ External Iliac Vein                                      acute  Distal                                                                    +-------------------------+---------+-----------+---------+-----------+--------+   Summary: IVC/Iliac: There is no  evidence of thrombus involving the IVC. There is evidence of acute thrombus involving the left external iliac vein. Unable to visualize CIV on this exam.  *See table(s) above for measurements and observations.   Preliminary    VAS US  LOWER EXTREMITY VENOUS (DVT) (ONLY [facility]) Result Date: 05/29/2023  Lower Venous DVT Study Patient Name:  HARSHAL BURR [facility]  Date of Exam:   05/29/2023 Medical Rec #: [id number]           Accession #:    [id number] Date of Birth: 02-21-1940           Patient Gender: M Patient Age:   75 years Exam Location:  [facility] Procedure:      VAS US  LOWER EXTREMITY VENOUS (DVT) Referring Phys: Sudie Ely --------------------------------------------------------------------------------  Indications: Swelling, and Pain.  Performing Technologist: Arlyce Berger RVT, RDMS  Examination Guidelines: A complete evaluation includes B-mode imaging, spectral Doppler, color Doppler, and power Doppler as needed of all accessible portions of each vessel. Bilateral testing is considered an integral part of a complete examination. Limited examinations for reoccurring indications may be performed as noted. The reflux portion of the exam is performed with the patient in reverse Trendelenburg.  +-----+---------------+---------+-----------+----------+--------------+ RIGHTCompressibilityPhasicitySpontaneityPropertiesThrombus Aging +-----+---------------+---------+-----------+----------+--------------+ CFV  Full           Yes      Yes                                 +-----+---------------+---------+-----------+----------+--------------+   +---------+---------------+---------+-----------+----------+--------------+ LEFT     CompressibilityPhasicitySpontaneityPropertiesThrombus Aging +---------+---------------+---------+-----------+----------+--------------+ CFV      None           No       No                   Acute           +---------+---------------+---------+-----------+----------+--------------+ SFJ      None                                         Acute          +---------+---------------+---------+-----------+----------+--------------+ FV Prox  None           No       No                   Acute          +---------+---------------+---------+-----------+----------+--------------+ FV Mid   None           No       No                   Acute          +---------+---------------+---------+-----------+----------+--------------+ FV DistalPartial        No       No  Acute          +---------+---------------+---------+-----------+----------+--------------+ PFV      None           No       No                   Acute          +---------+---------------+---------+-----------+----------+--------------+ POP      Partial        No       Yes                  Acute          +---------+---------------+---------+-----------+----------+--------------+ PTV      Full                                                        +---------+---------------+---------+-----------+----------+--------------+ PERO     Partial        No       No                   Acute          +---------+---------------+---------+-----------+----------+--------------+     Summary: RIGHT: - No evidence of common femoral vein obstruction.   LEFT: - Findings consistent with acute deep vein thrombosis involving the left common femoral vein, SF junction, left femoral vein, left proximal profunda vein, left popliteal vein, and left peroneal veins.  - No cystic structure found in the popliteal fossa.  *See table(s) above for measurements and observations.    Preliminary    CT Angio Chest PE W and/or Wo Contrast Result Date: 05/28/2023 CLINICAL DATA:  History of prostate cancer, groin pain, lower extremity edema from thigh down to feet since yesterday. Left testicle pain but no swelling. PE suspected. EXAM: CT  ANGIOGRAPHY CHEST CT ABDOMEN AND PELVIS WITH CONTRAST TECHNIQUE: Multidetector CT imaging of the chest was performed using the standard protocol during bolus administration of intravenous contrast. Multiplanar CT image reconstructions and MIPs were obtained to evaluate the vascular anatomy. Multidetector CT imaging of the abdomen and pelvis was performed using the standard protocol during bolus administration of intravenous contrast. RADIATION DOSE REDUCTION: This exam was performed according to the departmental dose-optimization program which includes automated exposure control, adjustment of the mA and/or kV according to patient size and/or use of iterative reconstruction technique. CONTRAST:  75mL OMNIPAQUE  IOHEXOL  350 MG/ML SOLN COMPARISON:  Scrotal ultrasound earlier today; PET/CT 10/12/2022; CT abdomen pelvis 08/10/2020 FINDINGS: CTA CHEST FINDINGS Cardiovascular: No pericardial effusion. Coronary artery and aortic atherosclerotic calcification. Normal caliber thoracic aorta. Negative for acute pulmonary embolism. Mediastinum/Nodes: Small hiatal hernia. Trachea is patent. Normal thyroid . No thoracic adenopathy. Lungs/Pleura: No focal consolidation, pleural effusion, or pneumothorax. Atelectasis in the lingula. Musculoskeletal: No acute fracture or suspicious osseous lesion. Review of the MIP images confirms the above findings. CT ABDOMEN and PELVIS FINDINGS Hepatobiliary: Cholelithiasis. No evidence of acute cholecystitis. Unremarkable liver and biliary tree. Pancreas: Fatty atrophy.  No acute abnormality. Spleen: Unremarkable. Adrenals/Urinary Tract: Normal adrenal glands. Right renal cyst not requiring follow-up. No urinary calculi or hydronephrosis. Unremarkable bladder. Stomach/Bowel: Normal caliber large and small bowel. No bowel wall thickening. Small hiatal hernia. Stomach is otherwise unremarkable. The appendix is not visualized. Vascular/Lymphatic: Aortic atherosclerosis. No enlarged abdominal or  pelvic lymph nodes. Reproductive: Prostatectomy. The  abnormality seen on same-day scrotal ultrasound is not visualized via CT. Penile prosthesis with reservoir in the right lower quadrant. Other: Stranding and soft tissue thickening throughout the omentum has slowly increased compared to prior exams and was mildly avid on PET/CT 10/12/2022. No ascites. No free intraperitoneal air. Musculoskeletal: Subcutaneous edema in the visualized portion of the proximal left thigh. No acute fracture or destructive osseous lesion. Review of the MIP images confirms the above findings. IMPRESSION: 1. Negative for acute pulmonary embolism. 2. Stranding and soft tissue thickening throughout the omentum has compared to prior exams and was mildly radiotracer avid on PET/CT 10/12/2022. 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 3. Cholelithiasis. 4. Nonspecific subcutaneous edema in the visualized portion of the proximal left thigh. 5. Aortic Atherosclerosis (ICD10-I70.0). Electronically Signed   By: Rozell Cornet M.D.   On: 05/28/2023 20:55   CT ABDOMEN PELVIS W CONTRAST Result Date: 05/28/2023 CLINICAL DATA:  History of prostate cancer, groin pain, lower extremity edema from thigh down to feet since yesterday. Left testicle pain but no swelling. PE suspected. EXAM: CT ANGIOGRAPHY CHEST CT ABDOMEN AND PELVIS WITH CONTRAST TECHNIQUE: Multidetector CT imaging of the chest was performed using the standard protocol during bolus administration of intravenous contrast. Multiplanar CT image reconstructions and MIPs were obtained to evaluate the vascular anatomy. Multidetector CT imaging of the abdomen and pelvis was performed using the standard protocol during bolus administration of intravenous contrast. RADIATION DOSE REDUCTION: This exam was performed according to the departmental dose-optimization program which includes automated exposure control, adjustment of the mA and/or kV according to patient size and/or use of  iterative reconstruction technique. CONTRAST:  75mL OMNIPAQUE  IOHEXOL  350 MG/ML SOLN COMPARISON:  Scrotal ultrasound earlier today; PET/CT 10/12/2022; CT abdomen pelvis 08/10/2020 FINDINGS: CTA CHEST FINDINGS Cardiovascular: No pericardial effusion. Coronary artery and aortic atherosclerotic calcification. Normal caliber thoracic aorta. Negative for acute pulmonary embolism. Mediastinum/Nodes: Small hiatal hernia. Trachea is patent. Normal thyroid . No thoracic adenopathy. Lungs/Pleura: No focal consolidation, pleural effusion, or pneumothorax. Atelectasis in the lingula. Musculoskeletal: No acute fracture or suspicious osseous lesion. Review of the MIP images confirms the above findings. CT ABDOMEN and PELVIS FINDINGS Hepatobiliary: Cholelithiasis. No evidence of acute cholecystitis. Unremarkable liver and biliary tree. Pancreas: Fatty atrophy.  No acute abnormality. Spleen: Unremarkable. Adrenals/Urinary Tract: Normal adrenal glands. Right renal cyst not requiring follow-up. No urinary calculi or hydronephrosis. Unremarkable bladder. Stomach/Bowel: Normal caliber large and small bowel. No bowel wall thickening. Small hiatal hernia. Stomach is otherwise unremarkable. The appendix is not visualized. Vascular/Lymphatic: Aortic atherosclerosis. No enlarged abdominal or pelvic lymph nodes. Reproductive: Prostatectomy. The abnormality seen on same-day scrotal ultrasound is not visualized via CT. Penile prosthesis with reservoir in the right lower quadrant. Other: Stranding and soft tissue thickening throughout the omentum has slowly increased compared to prior exams and was mildly avid on PET/CT 10/12/2022. No ascites. No free intraperitoneal air. Musculoskeletal: Subcutaneous edema in the visualized portion of the proximal left thigh. No acute fracture or destructive osseous lesion. Review of the MIP images confirms the above findings. IMPRESSION: 1. Negative for acute pulmonary embolism. 2. Stranding and soft tissue  thickening throughout the omentum has compared to prior exams and was mildly radiotracer avid on PET/CT 10/12/2022. 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 3. Cholelithiasis. 4. Nonspecific subcutaneous edema in the visualized portion of the proximal left thigh. 5. Aortic Atherosclerosis (ICD10-I70.0). Electronically Signed   By: Rozell Cornet M.D.   On: 05/28/2023 20:55   US  SCROTUM W/DOPPLER Result Date: 05/28/2023 CLINICAL  DATA:  Left testicle pain EXAM: SCROTAL ULTRASOUND DOPPLER ULTRASOUND OF THE TESTICLES TECHNIQUE: Complete ultrasound examination of the testicles, epididymis, and other scrotal structures was performed. Color and spectral Doppler ultrasound were also utilized to evaluate blood flow to the testicles. COMPARISON:  PET CT 10/12/2022 FINDINGS: Right testicle Measurements: 3.8 x 1.9 x 3 cm. Abnormal in appearance, heterogenous echotexture, focal heterogeneity measuring 3 x 1.5 x 2.4 cm. On cine clips, soft tissue echogenicity present within the scrotum. Left testicle Measurements:  3.2 x 1.7 x 2.5 cm.  Small cyst measuring 4 mm Right epididymis:  Poorly demonstrated Left epididymis:  Normal in size and appearance. Hydrocele:  None visualized. Varicocele:  None visualized. Pulsed Doppler interrogation of both testes demonstrates normal low resistance arterial and venous waveforms bilaterally. IMPRESSION: 1. Negative for left testicular torsion. 2. Abnormal appearance of the right testis and scrotum. Heterogeneous appearing right testicle with focal hypoechoic area measuring up to 3 cm, question due to remote insult, though slightly masslike on some of the images. Abnormal hypoechoic tissue or material within the scrotum potentially postsurgical in nature, correlate with physical exam and surgical history. Urology follow-up with short-term sonographic follow-up if deemed clinically appropriate. Electronically Signed   By: Esmeralda Hedge M.D.   On: 05/28/2023 19:45     ------  Alla Ar, NP Pager: [phone]   Please contact the urology consult pager with any further questions/concerns.

## 2023-05-29 NOTE — Consult Note (Addendum)
 Hospital Consult    Reason for Consult:  LLE DVT Requesting Physician:  Dr. Sari Cunning MRN #:  [id number]  History of Present Illness: This is a 83 y.o. male with past medical history significant for  Type II DM, CKD III,  HTN, HLD, CAD, and recurrent prostate cancer who presented with progressive left leg swelling that started Saturday. He says the swelling progressed to the point that he presented to the ER. He denies any pain, decreased motor or sensation in the left leg. He says some slight discomfort on ambulation around left knee. He denies any trauma to the leg. He denies any recent prolonged travel or change in his activity. Has not been ill recently or been more sedentary. No prior history of DVT or family history.  On evaluation duplex shows extensive LLE DVT extending from EIV to tibial veins. No IVC thrombus or evidence of PE on CT scan. He has been initiated on IV heparin . Vascular surgery was consulted for evaluation and management of DVT.   Past Medical History:  Diagnosis Date   Bladder calculus    CAD (coronary artery disease)    non obs    CKD (chronic kidney disease), stage III (HCC)    " i have 35-40% of my kidney function"; mgd by pcp    COLONIC POLYPS, HX OF 12/02/2008   DIABETES MELLITUS, TYPE II 07/19/2007   Dyspnea    reports sob over the last 2 years , underwent cath  in feb 2020 , reports cath was clean , referred to pulmonologist that stopped his Actos  and encouraged weight loss  ; reports today minimum improvement in breathing , appt with pulm in June 2020   Erectile dysfunction    GERD (gastroesophageal reflux disease)    Glaucoma    GOUT 07/19/2007   History of kidney stones    HYPERLIPIDEMIA 12/02/2008   HYPERTENSION 07/19/2007   Kidney stones    Prostate cancer (HCC) 2007   Reiter syndrome    incomplete   TIA (transient ischemic attack)    Tubular adenoma of colon    Vitamin D  deficiency     Past Surgical History:  Procedure Laterality Date   bladder  neck dilation     CATARACT EXTRACTION     both eyes- 2015   COLONOSCOPY  11/15/2021   12-11-14-polyps   CYSTOSCOPY N/A 06/11/2018   Procedure: CYSTOSCOPY WITH REMOVAL OF BLADDER CALCULUS;  Surgeon: Florencio Hunting, MD;  Location: [facility];  Service: Urology;  Laterality: N/A;   ELBOW SURGERY     bone spur - right   LEFT HEART CATH AND CORONARY ANGIOGRAPHY N/A 03/19/2018   Procedure: LEFT HEART CATH AND CORONARY ANGIOGRAPHY;  Surgeon: Arty Binning, MD;  Location: [facility];  Service: Cardiovascular;  Laterality: N/A;   ORIF ANKLE FRACTURE Left 05/17/2015   Procedure: OPEN REDUCTION INTERNAL FIXATION (ORIF) ANKLE FRACTURE;  Surgeon: Jasmine Mesi, MD;  Location: [facility];  Service: Orthopedics;  Laterality: Left;   PROSTATECTOMY  2007   URINARY SPHINCTER IMPLANT N/A 05/21/2019   Procedure: IMPLANTATION OF ARTIFICIAL URINARY SPHINCTER CYSTOSCOPY;  Surgeon: Erman Hayward, MD;  Location: [facility];  Service: Urology;  Laterality: N/A;   VASECTOMY      Allergies  Allergen Reactions   Ace Inhibitors Swelling    Tongue swelling. Reaction to benazepril only.     Prior to Admission medications   Medication Sig Start Date End Date Taking? Authorizing Provider  amLODipine  (NORVASC ) 5 MG tablet Take 1 tablet (  5 mg total) by mouth 2 (two) times daily. 12/30/22   Christel Cousins, MD  atorvastatin  (LIPITOR) 20 MG tablet TAKE 1 TABLET BY MOUTH AT BEDTIME . APPOINTMENT REQUIRED FOR FUTURE REFILLS 06/30/22   Christel Cousins, MD  dorzolamide -timolol  (COSOPT ) 22.3-6.8 MG/ML ophthalmic solution Place 1 drop into both eyes every morning. 09/20/13   [provider]  glipiZIDE  (GLUCOTROL ) 5 MG tablet Take 1 tablet (5 mg total) by mouth daily before breakfast. 12/30/22   Christel Cousins, MD  glucose blood (ACCU-CHEK AVIVA) test strip 1 each by Other route 2 (two) times daily. 06/24/11   Hillery Lown, MD  JARDIANCE  10 MG TABS tablet TAKE 1 TABLET BY MOUTH ONCE DAILY BEFORE BREAKFAST  03/28/23   Christel Cousins, MD  losartan  (COZAAR ) 50 MG tablet Take 1 tablet (50 mg total) by mouth at bedtime. 12/30/22   Christel Cousins, MD  Semaglutide  (RYBELSUS ) 3 MG TABS Take 1 tablet (3 mg total) by mouth daily. 04/10/23   Christel Cousins, MD    Social History   Socioeconomic History   Marital status: Married    Spouse name: Not on file   Number of children: 2   Years of education: Not on file   Highest education level: Not on file  Occupational History   Not on file  Tobacco Use   Smoking status: Former    Current packs/day: 0.00    Average packs/day: 1 pack/day for 30.0 years (30.0 ttl pk-yrs)    Types: Cigarettes    Start date: 01/25/1948    Quit date: 01/24/1978    Years since quitting: 45.3   Smokeless tobacco: Never  Vaping Use   Vaping status: Never Used  Substance and Sexual Activity   Alcohol  use: Not Currently    Comment: occasionally -socially   Drug use: No   Sexual activity: Not Currently    Comment: 2 childern  Other Topics Concern   Not on file  Social History Narrative   6 grands 2 greats      Was competitive fisherman.   Former Holiday representative   Social Drivers of Corporate investment banker Strain: Low Risk  (04/03/2023)   Overall Financial Resource Strain (CARDIA)    Difficulty of Paying Living Expenses: Not hard at all  Food Insecurity: No Food Insecurity (05/28/2023)   Hunger Vital Sign    Worried About Running Out of Food in the Last Year: Never true    Ran Out of Food in the Last Year: Never true  Transportation Needs: No Transportation Needs (05/28/2023)   PRAPARE - Administrator, Civil Service (Medical): No    Lack of Transportation (Non-Medical): No  Physical Activity: Inactive (04/03/2023)   Exercise Vital Sign    Days of Exercise per Week: 0 days    Minutes of Exercise per Session: 0 min  Stress: No Stress Concern Present (04/03/2023)   [employer] of Occupational Health - Occupational Stress Questionnaire    Feeling of  Stress : Not at all  Social Connections: Socially Integrated (05/28/2023)   Social Connection and Isolation Panel [NHANES]    Frequency of Communication with Friends and Family: Three times a week    Frequency of Social Gatherings with Friends and Family: Twice a week    Attends Religious Services: More than 4 times per year    Active Member of [employer] or Organizations: Yes    Attends Banker Meetings: More than 4 times per year  Marital Status: Married  Catering manager Violence: Not At Risk (05/28/2023)   Humiliation, Afraid, Rape, and Kick questionnaire    Fear of Current or Ex-Partner: No    Emotionally Abused: No    Physically Abused: No    Sexually Abused: No     Family History  Problem Relation Age of Onset   Cancer Sister        metastatic colon ca   Colon cancer Neg Hx    Esophageal cancer Neg Hx    Rectal cancer Neg Hx    Stomach cancer Neg Hx    Lung disease Neg Hx    Colon polyps Neg Hx    Pancreatic cancer Neg Hx     ROS: Otherwise negative unless mentioned in HPI  Physical Examination  Vitals:   05/29/23 0433 05/29/23 0846  BP: 109/69 120/78  Pulse: 91 90  Resp: 16 18  Temp: 98.3 F (36.8 C) 99.6 F (37.6 C)  SpO2: 96% 97%   Body mass index is 34.53 kg/m.  General:  WDWN in NAD Gait: Not observed HENT: WNL, normocephalic Pulmonary: normal non-labored breathing, without wheezing Cardiac: regular Abdomen: protuberant, soft Vascular Exam/Pulses: 2+ femoral, 2+ right DP/PT, 1+ left DP/PT Extremities: without ischemic changes, without Gangrene , without cellulitis; without open wounds; Left lower extremity edematous throughout  Musculoskeletal: no muscle wasting or atrophy  Neurologic: A&O X 3;  No focal weakness or paresthesias are detected; speech is fluent/normal Psychiatric:  The pt has Normal affect.  CBC    Component Value Date/Time   WBC 6.9 05/29/2023 0547   RBC 3.55 (L) 05/29/2023 0547   HGB 11.7 (L) 05/29/2023 0547   HGB  11.6 (L) 03/13/2018 0844   HCT 34.9 (L) 05/29/2023 0547   HCT 34.9 (L) 03/13/2018 0844   PLT 136 (L) 05/29/2023 0547   PLT 280 03/13/2018 0844   MCV 98.3 05/29/2023 0547   MCV 100 (H) 03/13/2018 0844   MCH 33.0 05/29/2023 0547   MCHC 33.5 05/29/2023 0547   RDW 13.8 05/29/2023 0547   RDW 12.3 03/13/2018 0844   LYMPHSABS 0.7 12/30/2022 1100   MONOABS 0.5 12/30/2022 1100   EOSABS 0.1 12/30/2022 1100   BASOSABS 0.1 12/30/2022 1100    BMET    Component Value Date/Time   NA 136 05/29/2023 0547   NA 141 03/13/2018 0844   K 4.1 05/29/2023 0547   CL 110 05/29/2023 0547   CO2 20 (L) 05/29/2023 0547   GLUCOSE 175 (H) 05/29/2023 0547   BUN 20 05/29/2023 0547   BUN 22 03/13/2018 0844   CREATININE 1.59 (H) 05/29/2023 0547   CREATININE 1.71 (H) 09/11/2019 0932   CALCIUM  8.5 (L) 05/29/2023 0547   GFRNONAA 43 (L) 05/29/2023 0547   GFRAA 33 (L) 05/22/2019 0445    COAGS: Lab Results  Component Value Date   INR 1.0 03/04/2021     Non-Invasive Vascular Imaging:   IVC/Iliac Findings:  +----------+------+--------+-----------------------+    IVC    PatentThrombus       Comments          +----------+------+--------+-----------------------+  IVC Prox  patent        patent by color/doppler  +----------+------+--------+-----------------------+  IVC Mid   patent        patent by color/doppler  +----------+------+--------+-----------------------+  IVC Distalpatent        patent by doppler        +----------+------+--------+-----------------------+     Unable to visualized CIV on this exam   +-------------------------+---------+-----------+---------+-----------+----  ----+  EIV            RT-PatentRT-ThrombusLT-PatentLT-ThrombusComments  +-------------------------+---------+-----------+---------+-----------+----  ----+  External Iliac Vein Prox                                 acute               +-------------------------+---------+-----------+---------+-----------+----  ----+  External Iliac Vein Mid                                  acute              +-------------------------+---------+-----------+---------+-----------+----  ----+  External Iliac Vein                                      acute              Distal                                                                      +-------------------------+---------+-----------+---------+-----------+----  ----+   Summary:  IVC/Iliac: There is no evidence of thrombus involving the IVC. There is evidence of acute thrombus involving the left external iliac vein. Unable to visualize CIV on this exam.   VAS US  Lower extremity venous (DVT): Summary:  RIGHT:  - No evidence of common femoral vein obstruction.   LEFT:  - Findings consistent with acute deep vein thrombosis involving the left common femoral vein, SF junction, left femoral vein, left proximal profunda vein, left popliteal vein, and left peroneal veins.      Statin:  Yes.   Beta Blocker:  No. Aspirin :  No. ACEI:  No. ARB:  Yes.   CCB use:  Yes Other antiplatelets/anticoagulants:  No.    ASSESSMENT/PLAN: This is a 83 y.o. male who presented with significant left lower extremity swelling over past three days found to have extensive LLE DVT involving the left EIV, CF vein, SFJ, femoral vein, proximal profunda vein, popliteal and left peroneal veins. He is without any signs or symptoms of phlegmasia. Continue IV heparin . Discussed with patient options for medical management with anticoagulation vs mechanical thrombectomy and anticoagulation. Discussed that main goal of mechanical thrombectomy would be to prevent post thrombotic syndrome. Explained that he may have residual swelling but it should be much improved from his current state. Patient has elected to proceed. There is availability with Dr. Vikki Graves in the [facility] lab this afternoon so will take him  to the cath lab for mechanical thrombectomy with possible stenting of left lower extremity for DVT.     Deneen Finical PA-C [facility] [phone] 05/29/2023    I have interviewed and examined patient with PA and agree with assessment and plan above.   Sakiyah Shur C. Vikki Graves, MD [facility] [place] Office: [phone] Pager: [phone]  1:08 PM

## 2023-05-29 NOTE — Progress Notes (Signed)
 PHARMACY - ANTICOAGULATION CONSULT NOTE  Pharmacy Consult for heparin  Indication: DVT  Allergies  Allergen Reactions   Ace Inhibitors Swelling    Tongue swelling. Reaction to benazepril only.     Patient Measurements: Height: 5\' 8"  (172.7 cm) Weight: 103 kg (227 lb 1.2 oz) IBW/kg (Calculated) : 68.4 HEPARIN  DW (KG): 90.7  Vital Signs: Temp: 99.6 F (37.6 C) (05/05 0846) Temp Source: Oral (05/05 0846) BP: 120/78 (05/05 0846) Pulse Rate: 90 (05/05 0846)  Labs: Recent Labs    05/28/23 1642 05/29/23 0547  HGB 12.6* 11.7*  HCT 40.1 34.9*  PLT 166 136*  HEPARINUNFRC  --  0.62  CREATININE 1.83* 1.59*    Estimated Creatinine Clearance: 41.6 mL/min (A) (by C-G formula based on SCr of 1.59 mg/dL (H)).   Medical History: Past Medical History:  Diagnosis Date   Bladder calculus    CAD (coronary artery disease)    non obs    CKD (chronic kidney disease), stage III (HCC)    " i have 35-40% of my kidney function"; mgd by pcp    COLONIC POLYPS, HX OF 12/02/2008   DIABETES MELLITUS, TYPE II 07/19/2007   Dyspnea    reports sob over the last 2 years , underwent cath  in feb 2020 , reports cath was clean , referred to pulmonologist that stopped his Actos  and encouraged weight loss  ; reports today minimum improvement in breathing , appt with pulm in June 2020   Erectile dysfunction    GERD (gastroesophageal reflux disease)    Glaucoma    GOUT 07/19/2007   History of kidney stones    HYPERLIPIDEMIA 12/02/2008   HYPERTENSION 07/19/2007   Kidney stones    Prostate cancer (HCC) 2007   Reiter syndrome    incomplete   TIA (transient ischemic attack)    Tubular adenoma of colon    Vitamin D  deficiency      Assessment:  53 yoM presenting with left lower extremity pain and swelling with suspected DVT. Vascular team has recommended heparin  overnight until confirmatory imaging tomorrow. No anticoagulation prior to admission. Pharmacy to manage heparin  infusion.   Pt s/p  thrombectomy, ok to begin heparin  now per [facility]. 6000 unit bolus given during case.  Goal of Therapy:  Heparin  level 0.3-0.7 units/ml Monitor platelets by anticoagulation protocol: Yes   Plan:  Resume heparin  1500 units/h Check heparin  level 8h after resuming  Tonjua Rossetti, PharmD, BCPS, [facility] Clinical Pharmacist [phone] Please check AMION for all [facility] Pharmacy numbers 05/29/2023

## 2023-05-29 NOTE — Op Note (Signed)
 Patient name: Rodney [name] MRN: [id number] DOB: [birth year] Sex: male  05/29/2023 Pre-operative Diagnosis: Extensive left lower extremity DVT Post-operative diagnosis:  Same, May-Thurner syndrome Surgeon:  Sarajane Cumming. Vikki Graves, MD Procedure Performed: 1.  Ultrasound-guided cannulation left small saphenous vein 2.  Left lower extremity venogram from popliteal vein including left popliteal vein, left femoral vein, left common femoral vein, left external and common iliac veins and IVC 3.  Intravascular ultrasound of left popliteal vein, left femoral vein, left common femoral vein, left external and common iliac veins and IVC 4.  Mechanical thrombectomy of left popliteal vein, left femoral vein, left common femoral vein, left external and common iliac veins and IVC using Inari ClotTriever 5.  Stent of left common iliac vein with 14 x 80 mm Medtronic Abre postdilated with 12 mm balloon 6.  Moderate sedation with fentanyl  and Versed  for 52 minutes   Indications: 83 year old male with extensive left lower extremity DVT without previous history of DVT.  This has been present for approximately 3 days and he has significant pain.  We have discussed his options being continued anticoagulation versus mechanical thrombectomy versus a watch and wait approach.  Patient has elected for mechanical thrombectomy we have discussed the risk benefits alternatives and he has agreed to proceed.  Findings: There was extensive clot burden on the left popliteal vein all the way through to the IVC where there was a time of clot sticking above what appeared to be stenosed approximately 90% left common iliac vein.  Significant clot was removed on 5 separate passes of an artery device.  After mechanical thrombectomy there was a notable tight stenosis from May Thurner configuration and this was stented 0% residual stenosis postdilated with a 12 mm balloon.  Completion venogram and IVUS demonstrated no residual thrombus and no  residual stenosis with brisk flow from the left popliteal vein all the way through the left femoral, left common femoral vein and external and common iliac veins into the IVC.   Procedure:  The patient was identified in the holding area and taken to room 8.  The patient was then placed prone on the table and prepped and draped in the usual sterile fashion.  A time out was called.  Ultrasound was used to evaluate the left small saphenous vein and this was noted to be patent however minimally compressible.  The area was anesthetized 1% lidocaine  and cannulated the small saphenous vein with a micropuncture needle followed by wire and a sheath.  I then administered additional 6000 units of heparin .  A Glidewire advantage wire was initially placed followed by a 8 [place] sheath and we selected the IVC using Berenstein catheter and placed the Glidewire advantage into the right IJ under fluoroscopic guidance.  We then began with intravascular ultrasound extending from the popliteal vein on the left, including the left femoral vein, left common femoral vein, left common and external iliac veins and IVC with the above findings of thrombus with the tunneling into the IVC and all the way down to the popliteal vein.  We then placed the Inari sheath and performed mechanical thrombectomy using Inari ClotTriever on 5 separate passes until the fifth 1 was clean.  We then performed IVUS again which demonstrated stenosis at the common iliac confluence to the IVC this was primarily balloon dilated with a 12 mm balloon reIVUS and then stented with 14 x 80 mm self-expanding stent that was postdilated with 12 mm balloon.  Completion IVUS demonstrated no residual stenosis with  the stent satisfactorily up to the level of the IVC.  Completion venography was performed from the popliteal all the way up the left lower extremity including the IVC.  Satisfied with this we remove the wire and then remove the sheath and suture-ligated the  cannulation site with 4-0 Monocryl suture.  A compressive wrap was placed on the leg.  Patient tolerated seizure without a complication.  Contrast: 20 cc    Laurian Edrington C. Vikki Graves, MD [facility] [place] Office: [phone] Pager: [phone]

## 2023-05-29 NOTE — Progress Notes (Signed)
 PHARMACY - ANTICOAGULATION Pharmacy Consult for heparin  Indication: DVT Brief A/P: Heparin  level within goal range Continue Heparin  at current rate   Allergies  Allergen Reactions   Ace Inhibitors Swelling    Tongue swelling. Reaction to benazepril only.     Patient Measurements: Height: 5\' 8"  (172.7 cm) Weight: 103 kg (227 lb 1.2 oz) IBW/kg (Calculated) : 68.4 HEPARIN  DW (KG): 90.7  Vital Signs: Temp: 98.3 F (36.8 C) (05/05 0433) Temp Source: Oral (05/05 0433) BP: 109/69 (05/05 0433) Pulse Rate: 91 (05/05 0433)  Labs: Recent Labs    05/28/23 1642 05/29/23 0547  HGB 12.6* 11.7*  HCT 40.1 34.9*  PLT 166 136*  HEPARINUNFRC  --  0.62  CREATININE 1.83*  --     Estimated Creatinine Clearance: 36.2 mL/min (A) (by C-G formula based on SCr of 1.83 mg/dL (H)).  Assessment:  83 y.o. male with possible DVT awaiting ultrasound for heparin   Goal of Therapy:  Heparin  level 0.3-0.7 units/ml Monitor platelets by anticoagulation protocol: Yes   Plan:  No change to heparin   F/U US  results   Claudine Cullens, PharmD, BCPS

## 2023-05-29 NOTE — Inpatient Diabetes Management (Signed)
 Inpatient Diabetes Program Recommendations  AACE/ADA: New Consensus Statement on Inpatient Glycemic Control (2015)  Target Ranges:  Prepandial:   less than 140 mg/dL      Peak postprandial:   less than 180 mg/dL (1-2 hours)      Critically ill patients:  140 - 180 mg/dL   Lab Results  Component Value Date   GLUCAP 266 (H) 05/29/2023   HGBA1C 7.9 (H) 04/04/2023    Review of Glycemic Control  Latest Reference Range & Units 05/28/23 23:27 05/29/23 08:46 05/29/23 12:42  Glucose-Capillary 70 - 99 mg/dL 829 (H) 562 (H) 130 (H)  (H): Data is abnormally high Diabetes history: T2DM Outpatient Diabetes medications: Glipizide  5 mg every day, Jardiance  10 mg every day,Rybelsus  3 mg QD Current orders for Inpatient glycemic control: Novolog  0-15 unit TID, HS  Inpatient Diabetes Program Recommendations:    Consider adding Semglee 10 units every day  Thanks, Marjo Sievert, MSN, RNC-OB Diabetes Coordinator [phone] (8a-5p)

## 2023-05-30 ENCOUNTER — Other Ambulatory Visit (HOSPITAL_COMMUNITY): Payer: Self-pay

## 2023-05-30 ENCOUNTER — Encounter (HOSPITAL_COMMUNITY): Payer: Self-pay | Admitting: Vascular Surgery

## 2023-05-30 ENCOUNTER — Telehealth (HOSPITAL_COMMUNITY): Payer: Self-pay | Admitting: Pharmacy Technician

## 2023-05-30 DIAGNOSIS — I82432 Acute embolism and thrombosis of left popliteal vein: Secondary | ICD-10-CM

## 2023-05-30 DIAGNOSIS — I82422 Acute embolism and thrombosis of left iliac vein: Secondary | ICD-10-CM

## 2023-05-30 DIAGNOSIS — Z48812 Encounter for surgical aftercare following surgery on the circulatory system: Secondary | ICD-10-CM

## 2023-05-30 DIAGNOSIS — I824Y2 Acute embolism and thrombosis of unspecified deep veins of left proximal lower extremity: Secondary | ICD-10-CM

## 2023-05-30 DIAGNOSIS — M7989 Other specified soft tissue disorders: Secondary | ICD-10-CM | POA: Diagnosis not present

## 2023-05-30 DIAGNOSIS — I871 Compression of vein: Secondary | ICD-10-CM

## 2023-05-30 DIAGNOSIS — I82409 Acute embolism and thrombosis of unspecified deep veins of unspecified lower extremity: Secondary | ICD-10-CM

## 2023-05-30 DIAGNOSIS — I82412 Acute embolism and thrombosis of left femoral vein: Secondary | ICD-10-CM

## 2023-05-30 DIAGNOSIS — Z95820 Peripheral vascular angioplasty status with implants and grafts: Secondary | ICD-10-CM

## 2023-05-30 DIAGNOSIS — I82452 Acute embolism and thrombosis of left peroneal vein: Secondary | ICD-10-CM

## 2023-05-30 LAB — CBC
HCT: 32.6 % — ABNORMAL LOW (ref 39.0–52.0)
Hemoglobin: 10.9 g/dL — ABNORMAL LOW (ref 13.0–17.0)
MCH: 32.4 pg (ref 26.0–34.0)
MCHC: 33.4 g/dL (ref 30.0–36.0)
MCV: 97 fL (ref 80.0–100.0)
Platelets: 131 10*3/uL — ABNORMAL LOW (ref 150–400)
RBC: 3.36 MIL/uL — ABNORMAL LOW (ref 4.22–5.81)
RDW: 13.7 % (ref 11.5–15.5)
WBC: 6.1 10*3/uL (ref 4.0–10.5)
nRBC: 0 % (ref 0.0–0.2)

## 2023-05-30 LAB — LIPID PANEL
Cholesterol: 96 mg/dL (ref 0–200)
HDL: 22 mg/dL — ABNORMAL LOW (ref >40)
LDL Cholesterol: 52 mg/dL (ref 0–99)
Total CHOL/HDL Ratio: 4.4 ratio
Triglycerides: 111 mg/dL (ref <150)
VLDL: 22 mg/dL (ref 0–40)

## 2023-05-30 LAB — BASIC METABOLIC PANEL WITH GFR
Anion gap: 10 (ref 5–15)
BUN: 22 mg/dL (ref 8–23)
CO2: 19 mmol/L — ABNORMAL LOW (ref 22–32)
Calcium: 8.5 mg/dL — ABNORMAL LOW (ref 8.9–10.3)
Chloride: 108 mmol/L (ref 98–111)
Creatinine, Ser: 1.61 mg/dL — ABNORMAL HIGH (ref 0.61–1.24)
GFR, Estimated: 42 mL/min — ABNORMAL LOW (ref >60)
Glucose, Bld: 164 mg/dL — ABNORMAL HIGH (ref 70–99)
Potassium: 4 mmol/L (ref 3.5–5.1)
Sodium: 137 mmol/L (ref 135–145)

## 2023-05-30 LAB — HEPARIN LEVEL (UNFRACTIONATED)
Heparin Unfractionated: 0.85 [IU]/mL — ABNORMAL HIGH (ref 0.30–0.70)
Heparin Unfractionated: 0.67 [IU]/mL (ref 0.30–0.70)

## 2023-05-30 LAB — GLUCOSE, CAPILLARY
Glucose-Capillary: 176 mg/dL — ABNORMAL HIGH (ref 70–99)
Glucose-Capillary: 181 mg/dL — ABNORMAL HIGH (ref 70–99)

## 2023-05-30 LAB — VANCOMYCIN, PEAK: Vancomycin Pk: <4 ug/mL — ABNORMAL LOW (ref 30–40)

## 2023-05-30 MED ORDER — APIXABAN 5 MG PO TABS: 5.0000 mg | ORAL_TABLET | Freq: Two times a day (BID) | ORAL | Status: DC

## 2023-05-30 MED ORDER — ASPIRIN 81 MG PO TBEC: 81.0000 mg | DELAYED_RELEASE_TABLET | Freq: Every day | ORAL | 12 refills | Status: DC

## 2023-05-30 MED ORDER — APIXABAN 5 MG PO TABS
10.0000 mg | ORAL_TABLET | Freq: Two times a day (BID) | ORAL | Status: DC
  Administered 2023-05-30: 10 mg via ORAL
  Filled 2023-05-30: qty 2

## 2023-05-30 MED ORDER — APIXABAN (ELIQUIS) VTE STARTER PACK (10MG AND 5MG)
ORAL_TABLET | ORAL | 0 refills | Status: DC
  Filled 2023-05-30: qty 74, 30d supply, fill #0

## 2023-05-30 MED ORDER — ASPIRIN 81 MG PO TBEC
81.0000 mg | DELAYED_RELEASE_TABLET | Freq: Every day | ORAL | Status: DC
  Administered 2023-05-30: 81 mg via ORAL
  Filled 2023-05-30: qty 1

## 2023-05-30 MED ORDER — SENNOSIDES-DOCUSATE SODIUM 8.6-50 MG PO TABS: 1.0000 | ORAL_TABLET | Freq: Two times a day (BID) | ORAL | Status: DC

## 2023-05-30 MED ORDER — SENNOSIDES-DOCUSATE SODIUM 8.6-50 MG PO TABS: 1.0000 | ORAL_TABLET | Freq: Two times a day (BID) | ORAL | 1 refills | Status: AC

## 2023-05-30 MED ORDER — APIXABAN 5 MG PO TABS: ORAL_TABLET | ORAL | 2 refills | Status: DC

## 2023-05-30 MED ORDER — POLYETHYLENE GLYCOL 3350 17 G PO PACK: 17.0000 g | PACK | Freq: Every day | ORAL | Status: DC

## 2023-05-30 MED ORDER — POLYETHYLENE GLYCOL 3350 17 G PO PACK: 17.0000 g | PACK | Freq: Every day | ORAL | 0 refills | Status: AC

## 2023-05-30 NOTE — Progress Notes (Signed)
 PHARMACY - ANTICOAGULATION CONSULT NOTE  Pharmacy Consult for heparin  Indication: DVT  Allergies  Allergen Reactions   Benazepril Shortness Of Breath, Swelling and Other (See Comments)    Tongue became swollen- had to come to the ED   Patient Measurements: Height: 5\' 8"  (172.7 cm) Weight: 103 kg (227 lb 1.2 oz) IBW/kg (Calculated) : 68.4 HEPARIN  DW (KG): 90.7  Vital Signs: Temp: 98.2 F (36.8 C) (05/05 2312) Temp Source: Oral (05/05 2312) BP: 95/53 (05/05 2312) Pulse Rate: 79 (05/05 2312)  Labs: Recent Labs    05/28/23 1642 05/29/23 0547 05/30/23 0030  HGB 12.6* 11.7*  --   HCT 40.1 34.9*  --   PLT 166 136*  --   HEPARINUNFRC  --  0.62 0.85*  CREATININE 1.83* 1.59*  --     Estimated Creatinine Clearance: 41.6 mL/min (A) (by C-G formula based on SCr of 1.59 mg/dL (H)).   Medical History: Past Medical History:  Diagnosis Date   Bladder calculus    CAD (coronary artery disease)    non obs    CKD (chronic kidney disease), stage III (HCC)    " i have 35-40% of my kidney function"; mgd by pcp    COLONIC POLYPS, HX OF 12/02/2008   DIABETES MELLITUS, TYPE II 07/19/2007   Dyspnea    reports sob over the last 2 years , underwent cath  in feb 2020 , reports cath was clean , referred to pulmonologist that stopped his Actos  and encouraged weight loss  ; reports today minimum improvement in breathing , appt with pulm in June 2020   Erectile dysfunction    GERD (gastroesophageal reflux disease)    Glaucoma    GOUT 07/19/2007   History of kidney stones    HYPERLIPIDEMIA 12/02/2008   HYPERTENSION 07/19/2007   Kidney stones    Prostate cancer (HCC) 2007   Reiter syndrome    incomplete   TIA (transient ischemic attack)    Tubular adenoma of colon    Vitamin D  deficiency      Assessment:  88 yoM presenting with left lower extremity pain and swelling with suspected DVT. Vascular team has recommended heparin  overnight until confirmatory imaging tomorrow. No  anticoagulation prior to admission. Pharmacy to manage heparin  infusion.   Pt s/p thrombectomy, ok to begin heparin  now per [facility]. 6000 unit bolus given during case.  5/6 AM update:  Heparin  level supra-therapeutic   Goal of Therapy:  Heparin  level 0.3-0.7 units/ml Monitor platelets by anticoagulation protocol: Yes   Plan:  Dec heparin  to 1350 units/hr Heparin  level in 8 hours  Silvestre Drum, PharmD, BCPS Clinical Pharmacist Phone: [phone]

## 2023-05-30 NOTE — Progress Notes (Signed)
 Order received to discharge patient.  Telemetry monitor removed and CCMD notified.  PIV access x1 removed.  Discharge instructions, follow up, medications and instructions for their use discussed with patient.  TOC meds given to patient prior to his leaving.

## 2023-05-30 NOTE — Evaluation (Signed)
 Physical Therapy Brief Evaluation and Discharge Note Patient Details Name: Rodney [name] MRN: [id number] DOB: [birth year] Today's Date: 05/30/2023   History of Present Illness  83 yo male admitted 5/4 with LLE edema. Pt with extensive LLE DVT involving the left EIV, CF vein, SFJ, femoral vein, proximal profunda vein, popliteal and left peroneal veins s/p mechanical thrombectomy with stent on 5/5. PMHx: prostate CA, T2DM, nephrolithiasis, HTN, HLD, CAD, CKD, gout  Clinical Impression  Pt very pleasant and eager to return home. Pt enjoys bass fishing and used to fish competitively. Pt currently able to perform all basic transfers, gait and stairs without assist. Pt at baseline functional status without need for further therapy. Pt reports no pain and wife able to assist with compression hose at D/C.        PT Assessment Patient does not need any further PT services  Assistance Needed at Discharge  None    Equipment Recommendations None recommended by PT  Recommendations for Other Services       Precautions/Restrictions Precautions Precautions: None        Mobility  Bed Mobility Rolling: Modified independent (Device/Increase time)        Transfers Overall transfer level: Modified independent                      Ambulation/Gait Ambulation/Gait assistance: Independent Gait Distance (Feet): 600 Feet Assistive device: None Gait Pattern/deviations: WFL(Within Functional Limits) Gait Speed: Pace WFL    Home Activity Instructions    Stairs Stairs: Yes Stairs assistance: Modified independent (Device/Increase time) Stair Management: One rail Left, Alternating pattern, Forwards Number of Stairs: 3    Modified Rankin (Stroke Patients Only)        Balance Overall balance assessment: No apparent balance deficits (not formally assessed)                        Pertinent Vitals/Pain PT - Brief Vital Signs All Vital Signs Stable: Yes (HR 134 with  gait, back to 107 with sitting) Pain Assessment Pain Assessment: No/denies pain     Home Living Family/patient expects to be discharged to:: Private residence Living Arrangements: Spouse/significant other Available Help at Discharge: Family;Available 24 hours/day Home Environment: Stairs to enter  [employer] of Steps: 3 Home Equipment: None   Additional Comments: active, enjoys fishing    Prior Function Level of Independence: Independent      UE/LE Assessment   UE ROM/Strength/Tone/Coordination: WFL    LE ROM/Strength/Tone/Coordination: [facility]      Communication   Communication Communication: No apparent difficulties     Cognition Overall Cognitive Status: Appears within functional limits for tasks assessed/performed       General Comments      Exercises     Assessment/Plan    PT Problem List         PT Visit Diagnosis Other abnormalities of gait and mobility (R26.89)    No Skilled PT All education completed;Patient at baseline level of functioning   Co-evaluation                AMPAC 6 Clicks Help needed turning from your back to your side while in a flat bed without using bedrails?: None Help needed moving from lying on your back to sitting on the side of a flat bed without using bedrails?: None Help needed moving to and from a bed to a chair (including a wheelchair)?: None Help needed standing up from a chair using your arms (  e.g., wheelchair or bedside chair)?: None Help needed to walk in hospital room?: None Help needed climbing 3-5 steps with a railing? : None 6 Click Score: 24      End of Session   Activity Tolerance: Patient tolerated treatment well Patient left: in bed;with call bell/phone within reach Nurse Communication: Mobility status PT Visit Diagnosis: Other abnormalities of gait and mobility (R26.89)     Time: 3664-4034 PT Time Calculation (min) (ACUTE ONLY): 12 min  Charges:   PT Evaluation $PT Eval Low Complexity:  1 Low      Theoren Palka P, PT Acute Rehabilitation Services Office: [phone]   Jackey Mary Mikeyla Music  05/30/2023, 10:56 AM

## 2023-05-30 NOTE — Telephone Encounter (Signed)
 Patient Product/process development scientist completed.    The patient is insured through [facility]. Patient has Medicare and is not eligible for a copay card, but may be able to apply for patient assistance or Medicare RX Payment Plan (Patient Must reach out to their plan, if eligible for payment plan), if available.    Ran test claim for Eliquis Starter Pack and the current 30 day co-pay is $47.00.  Ran test claim for Eliquis 5 mg and the current 30 day co-pay is $47.00.  This test claim was processed through [facility]- copay amounts may vary at other pharmacies due to pharmacy/plan contracts, or as the patient moves through the different stages of their insurance plan.     Morgan Arab, CPHT Pharmacy Technician III Certified Patient Advocate [facility] Patient Advocate Team Direct Number: [phone]  Fax: [phone]

## 2023-05-30 NOTE — Progress Notes (Addendum)
 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  Ultrasound-guided cannulation left small saphenous vein 2.  Left lower extremity venogram from popliteal vein including left popliteal vein, left femoral vein, left common femoral vein, left external and common iliac veins and IVC 3.  Intravascular ultrasound of left popliteal vein, left femoral vein, left common femoral vein, left external and common iliac veins and IVC 4.  Mechanical thrombectomy of left popliteal vein, left femoral vein, left common femoral vein, left external and common iliac veins and IVC using Inari ClotTriever 5.  Stent of left common iliac vein with 14 x 80 mm Medtronic Abre postdilated with 12 mm balloon  I will order thigh high compression 20-30 mm hg to be worn daily Elevation and exercise daily Doac and ASA can be started today F/U in 1 month with  left LE DVT study Stable from a vascular point of view       Rocky Cipro 05/30/2023 7:52 AM --  Laboratory Lab Results: Recent Labs    05/29/23 0547 05/30/23 0425  WBC 6.9 6.1  HGB 11.7* 10.9*  HCT 34.9* 32.6*  PLT 136* 131*   BMET Recent Labs    05/29/23 0547 05/30/23 0425  NA 136 137  K 4.1 4.0  CL 110 108  CO2 20* 19*  GLUCOSE 175* 164*  BUN 20 22  CREATININE 1.59* 1.61*  CALCIUM  8.5* 8.5*    COAG Lab Results  Component Value Date   INR 1.0 03/04/2021   No results found for: "PTT"   I have independently interviewed and  examined patient and agree with PA assessment and plan above.  Recovering well after thrombectomy and stenting for May-Thurner syndrome.  Possible DVT brought on by recurrent prostate cancer although no definitive diagnosis at this time.  From strictly DVT standpoint would require at least 3 months but probable 6 months of anticoagulation along with aspirin  as long as tolerated.  He will follow-up in 1 month with noninvasive studies.  Ikeya Brockel C. Vikki Graves, MD [facility] [place] Office: [phone] Pager: [phone]

## 2023-05-30 NOTE — Progress Notes (Signed)
 PHARMACY - ANTICOAGULATION CONSULT NOTE  Pharmacy Consult for heparin   > transition to Eliquis Indication: DVT  Allergies  Allergen Reactions   Benazepril Shortness Of Breath, Swelling and Other (See Comments)    Tongue became swollen- had to come to the ED   Patient Measurements: Height: 5\' 8"  (172.7 cm) Weight: 103 kg (227 lb 1.2 oz) IBW/kg (Calculated) : 68.4 HEPARIN  DW (KG): 90.7  Vital Signs: Temp: 97.8 F (36.6 C) (05/06 0316) Temp Source: Oral (05/06 0316) BP: 119/67 (05/06 0803) Pulse Rate: 93 (05/06 0803)  Labs: Recent Labs    05/28/23 1642 05/29/23 0547 05/30/23 0030 05/30/23 0425  HGB 12.6* 11.7*  --  10.9*  HCT 40.1 34.9*  --  32.6*  PLT 166 136*  --  131*  HEPARINUNFRC  --  0.62 0.85*  --   CREATININE 1.83* 1.59*  --  1.61*    Estimated Creatinine Clearance: 41.1 mL/min (A) (by C-G formula based on SCr of 1.61 mg/dL (H)).   Medical History: Past Medical History:  Diagnosis Date   Bladder calculus    CAD (coronary artery disease)    non obs    CKD (chronic kidney disease), stage III (HCC)    " i have 35-40% of my kidney function"; mgd by pcp    COLONIC POLYPS, HX OF 12/02/2008   DIABETES MELLITUS, TYPE II 07/19/2007   Dyspnea    reports sob over the last 2 years , underwent cath  in feb 2020 , reports cath was clean , referred to pulmonologist that stopped his Actos  and encouraged weight loss  ; reports today minimum improvement in breathing , appt with pulm in June 2020   Erectile dysfunction    GERD (gastroesophageal reflux disease)    Glaucoma    GOUT 07/19/2007   History of kidney stones    HYPERLIPIDEMIA 12/02/2008   HYPERTENSION 07/19/2007   Kidney stones    Prostate cancer (HCC) 2007   Reiter syndrome    incomplete   TIA (transient ischemic attack)    Tubular adenoma of colon    Vitamin D  deficiency      Assessment:  64 yoM presenting with left lower extremity pain and swelling with suspected DVT. Vascular team has recommended  heparin  overnight until confirmatory imaging tomorrow. No anticoagulation prior to admission. Pharmacy to manage heparin  infusion.   Pt s/p thrombectomy,  resumedn heparin  5/5 PMper VVS. 6000 unit bolus given during case.  5/6 AM update:  Heparin  level supra-therapeutic. Thus heparin  rate decreased to 1350 units/hr.    Now pharmacy consulted to transition from IV heparin  to oral anticoagulation with Eliquis.   Goal of Therapy:  Heparin  level 0.3-0.7 units/ml Monitor platelets by anticoagulation protocol: Yes   Plan:  Stop IV heparin  now,   start Eliquis 10mg  bid x 7 days then on 5/13 reduce to 5mg  BID Monitor for s/sx of bleeding.  Will educate patient or family /caregiver prior to discharge.     Thank you for allowing pharmacy to be part of this patients care team. Alisa Irish, RPh Clinical Pharmacist Please check AMION for all [facility] Pharmacy phone numbers After 10:00 PM, call Main Pharmacy [phone]

## 2023-05-30 NOTE — Discharge Summary (Signed)
 Physician Discharge Summary   Patient: Rodney [name] MRN: [id number] DOB: 1941/01/05  Admit date:     05/28/2023  Discharge date: 05/30/23  Discharge Physician: Danette Duos   PCP: Christel Cousins, MD   Recommendations at discharge:   Close follow up with Urology and oncology Follow up with Vascular.   Discharge Diagnoses: Principal Problem:   DVT (deep venous thrombosis) (HCC) Active Problems:   Type 2 diabetes mellitus with stage 3 chronic kidney disease (HCC)   Dyslipidemia   Gout   Essential hypertension   Chronic kidney disease (CKD), stage III (moderate) (HCC)   Malignant neoplasm of prostate (HCC)   Morbid (severe) obesity due to excess calories (HCC) complicated by hbp/dm   Subclinical hypothyroidism   Acute deep vein thrombosis (DVT) of calf muscle vein of left lower extremity (HCC)  Resolved Problems:   * No resolved hospital problems. *  Hospital Course: [name] is a 83 y.o. male with medical history significant of prostate cancer status posttreatment and recurrence, type 2 diabetes, nephrolithiasis, erectile dysfunctions, essential hypertension, hyperlipidemia, coronary artery disease, Reiter's syndrome who presented to the ER with progressive left leg swelling from the thigh down to his feet that he first noticed early yesterday.  The swelling has persisted and getting worse.  No trauma.  Is also painful especially when he tries to walk.  Patient seen in the ER and the leg is swollen warm and tender.  Bedside Doppler ultrasound suggested possible DVT.  Patient is scheduled to have vascular studies.  Scan also shows possible recurrence of his prostate cancer.  Patient has been initiated on heparin  and being admitted to the hospital pending formal Doppler studies tomorrow.  No evidence of PE on CT scan.     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 No PE on CTA,  -.   Treated with heparin  - vascular surgery eval, patient underwent venogram and mechanical thrombectomy left popliteal vein, left femoral vein, left common femoral vein, left external and common iliac vein and stent of the left common iliac vein placement by vascular on 5/5. Patient evaluated by vascular today okay to start DOAC and aspirin .  Patient can be discharged home.  Patient feels stable and wants to go home.   Prostate cancer Omental soft tissue thickening S/p surgery and then radiation, currently monitored by urology.  - Urology evaluated patient, recommend follow up out patient.     Urinary sphincter implant Likely what's responsible for abnormal read on scrotal u/s - f/u urology   T2DM - SSI  Resume home regimen.   CKD 3 Appears stable - monitor   HTN Controlled - cont home amlodipine  - home losartan  on hold          Consultants: Vascular, urology Procedures performed: Mechanical thrombectomy Disposition: Home Diet recommendation:  Discharge Diet Orders (From admission, onward)     Start     Ordered   05/30/23 0000  Diet - low sodium heart healthy        05/30/23 1445           Cardiac diet DISCHARGE MEDICATION: Allergies as of 05/30/2023       Reactions   Benazepril Shortness Of Breath, Swelling, Other (See Comments)   Tongue became swollen- had to come to the ED        Medication List     STOP taking these medications    losartan  50 MG  tablet Commonly known as: COZAAR        TAKE these medications    amLODipine  5 MG tablet Commonly known as: NORVASC  Take 1 tablet (5 mg total) by mouth 2 (two) times daily.   apixaban 5 MG Tabs tablet Commonly known as: ELIQUIS Take 2 tablets (10 mg total) by mouth 2 (two) times daily for 7 days, THEN 1 tablet (5 mg total) 2 (two) times daily. Start taking on: May 30, 2023   aspirin  EC 81 MG tablet Take 1 tablet (81 mg total) by mouth daily. Swallow whole. Start taking on: May 31, 2023    atorvastatin  20 MG tablet Commonly known as: LIPITOR TAKE 1 TABLET BY MOUTH AT BEDTIME . APPOINTMENT REQUIRED FOR FUTURE REFILLS What changed:  how much to take how to take this when to take this additional instructions   dorzolamide -timolol  2-0.5 % ophthalmic solution Commonly known as: COSOPT  Place 1 drop into both eyes every morning.   glipiZIDE  5 MG tablet Commonly known as: GLUCOTROL  Take 1 tablet (5 mg total) by mouth daily before breakfast.   glucose blood test strip Commonly known as: Accu-Chek Aviva 1 each by Other route 2 (two) times daily.   Jardiance  10 MG Tabs tablet Generic drug: empagliflozin  TAKE 1 TABLET BY MOUTH ONCE DAILY BEFORE BREAKFAST   polyethylene glycol 17 g packet Commonly known as: MIRALAX / GLYCOLAX Take 17 g by mouth daily. Start taking on: May 31, 2023   Rybelsus  3 MG Tabs Generic drug: Semaglutide  Take 1 tablet (3 mg total) by mouth daily.   senna-docusate 8.6-50 MG tablet Commonly known as: Senokot-S Take 1 tablet by mouth 2 (two) times daily.        Follow-up Information     Adine Hoof, MD Follow up in 4 week(s).   Specialties: Vascular Surgery, Cardiology Why: Office will call you to arrange your appt (sent) Contact information: [address] [place] [address] [phone]                Discharge Exam: Cleavon Curls Weights   05/28/23 2200  Weight: 103 kg   General No acute distress  Condition at discharge: stable  The results of significant diagnostics from this hospitalization (including imaging, microbiology, ancillary and laboratory) are listed below for reference.   Imaging Studies: PERIPHERAL VASCULAR CATHETERIZATION Result Date: 05/29/2023 Images from the original result were not included. Patient name: Rodney [name] MRN: [id number] DOB: 02-03-1940 Sex: male 05/29/2023 Pre-operative Diagnosis: Extensive left lower extremity DVT Post-operative diagnosis:  Same, May-Thurner syndrome  Surgeon:  Sarajane Cumming. Vikki Graves, MD Procedure Performed: 1.  Ultrasound-guided cannulation left small saphenous vein 2.  Left lower extremity venogram from popliteal vein including left popliteal vein, left femoral vein, left common femoral vein, left external and common iliac veins and IVC 3.  Intravascular ultrasound of left popliteal vein, left femoral vein, left common femoral vein, left external and common iliac veins and IVC 4.  Mechanical thrombectomy of left popliteal vein, left femoral vein, left common femoral vein, left external and common iliac veins and IVC using Inari ClotTriever 5.  Stent of left common iliac vein with 14 x 80 mm Medtronic Abre postdilated with 12 mm balloon 6.  Moderate sedation with fentanyl  and Versed  for 52 minutes Indications: 83 year old male with extensive left lower extremity DVT without previous history of DVT.  This has been present for approximately 3 days and he has significant pain.  We have discussed his options being continued anticoagulation versus mechanical thrombectomy versus a  watch and wait approach.  Patient has elected for mechanical thrombectomy we have discussed the risk benefits alternatives and he has agreed to proceed. Findings: There was extensive clot burden on the left popliteal vein all the way through to the IVC where there was a time of clot sticking above what appeared to be stenosed approximately 90% left common iliac vein.  Significant clot was removed on 5 separate passes of an artery device.  After mechanical thrombectomy there was a notable tight stenosis from May Thurner configuration and this was stented 0% residual stenosis postdilated with a 12 mm balloon.  Completion venogram and IVUS demonstrated no residual thrombus and no residual stenosis with brisk flow from the left popliteal vein all the way through the left femoral, left common femoral vein and external and common iliac veins into the IVC.  Procedure:  The patient was identified in the  holding area and taken to room 8.  The patient was then placed prone on the table and prepped and draped in the usual sterile fashion.  A time out was called.  Ultrasound was used to evaluate the left small saphenous vein and this was noted to be patent however minimally compressible.  The area was anesthetized 1% lidocaine  and cannulated the small saphenous vein with a micropuncture needle followed by wire and a sheath.  I then administered additional 6000 units of heparin .  A Glidewire advantage wire was initially placed followed by a 8 [place] sheath and we selected the IVC using Berenstein catheter and placed the Glidewire advantage into the right IJ under fluoroscopic guidance.  We then began with intravascular ultrasound extending from the popliteal vein on the left, including the left femoral vein, left common femoral vein, left common and external iliac veins and IVC with the above findings of thrombus with the tunneling into the IVC and all the way down to the popliteal vein.  We then placed the Inari sheath and performed mechanical thrombectomy using Inari ClotTriever on 5 separate passes until the fifth 1 was clean.  We then performed IVUS again which demonstrated stenosis at the common iliac confluence to the IVC this was primarily balloon dilated with a 12 mm balloon reIVUS and then stented with 14 x 80 mm self-expanding stent that was postdilated with 12 mm balloon.  Completion IVUS demonstrated no residual stenosis with the stent satisfactorily up to the level of the IVC.  Completion venography was performed from the popliteal all the way up the left lower extremity including the IVC.  Satisfied with this we remove the wire and then remove the sheath and suture-ligated the cannulation site with 4-0 Monocryl suture.  A compressive wrap was placed on the leg.  Patient tolerated seizure without a complication. Contrast: 20 cc Brandon C. Vikki Graves, MD [facility] Office:  [phone] Pager: [phone]   VAS US  IVC/ILIAC (VENOUS ONLY) Result Date: 05/29/2023 IVC/ILIAC STUDY Patient Name:  Karan Bosque [facility]  Date of Exam:   05/29/2023 Medical Rec #: [id number]           Accession #:    [id number] Date of Birth: [birth year]           Patient Gender: M Patient Age:   48 years Exam Location[facility]  [facility] Procedure:      VAS US  IVC/ILIAC (VENOUS ONLY) Referring Phys: --------------------------------------------------------------------------------  Indications: Acute DVT of LLE Limitations: Obesity and air/bowel gas.  Comparison Study: No previous exams Performing Technologist: Jody Hill RVT, RDMS  Examination  Guidelines: A complete evaluation includes B-mode imaging, spectral Doppler, color Doppler, and power Doppler as needed of all accessible portions of each vessel. Bilateral testing is considered an integral part of a complete examination. Limited examinations for reoccurring indications may be performed as noted.  IVC/Iliac Findings: +----------+------+--------+-----------------------+    IVC    PatentThrombus       Comments         +----------+------+--------+-----------------------+ IVC Prox  patent        patent by color/doppler +----------+------+--------+-----------------------+ IVC Mid   patent        patent by color/doppler +----------+------+--------+-----------------------+ IVC Distalpatent        patent by doppler       +----------+------+--------+-----------------------+  Unable to visualized CIV on this exam +-------------------------+---------+-----------+---------+-----------+--------+            EIV           RT-PatentRT-ThrombusLT-PatentLT-ThrombusComments +-------------------------+---------+-----------+---------+-----------+--------+ External Iliac Vein Prox                                 acute            +-------------------------+---------+-----------+---------+-----------+--------+ External Iliac Vein Mid                                   acute            +-------------------------+---------+-----------+---------+-----------+--------+ External Iliac Vein                                      acute            Distal                                                                    +-------------------------+---------+-----------+---------+-----------+--------+   Summary: IVC/Iliac: There is no evidence of thrombus involving the IVC. There is evidence of acute thrombus involving the left external iliac vein. Unable to visualize CIV on this exam.  *See table(s) above for measurements and observations.  Electronically signed by Angela Kell MD on 05/29/2023 at 4:05:09 PM.    Final    VAS US  LOWER EXTREMITY VENOUS (DVT) (ONLY [facility] & [facility]) Result Date: 05/29/2023  Lower Venous DVT Study Patient Name:  FRANKLIN BRESSAN [facility]  Date of Exam:   05/29/2023 Medical Rec #: [id number]           Accession #:    [id number] Date of Birth: [birth year]           Patient Gender: M Patient Age:   9 years Exam Location:  [facility] Procedure:      VAS US  LOWER EXTREMITY VENOUS (DVT) Referring Phys: Sudie Ely --------------------------------------------------------------------------------  Indications: Swelling, and Pain.  Performing Technologist: Arlyce Berger RVT, RDMS  Examination Guidelines: A complete evaluation includes B-mode imaging, spectral Doppler, color Doppler, and power Doppler as needed of all accessible portions of each vessel. Bilateral testing is considered an integral part of a complete examination. Limited examinations for reoccurring indications may be performed as noted. The reflux portion of the exam is performed with the  patient in reverse Trendelenburg.  +-----+---------------+---------+-----------+----------+--------------+ RIGHTCompressibilityPhasicitySpontaneityPropertiesThrombus Aging +-----+---------------+---------+-----------+----------+--------------+ CFV  Full           Yes      Yes                                  +-----+---------------+---------+-----------+----------+--------------+   +---------+---------------+---------+-----------+----------+--------------+ LEFT     CompressibilityPhasicitySpontaneityPropertiesThrombus Aging +---------+---------------+---------+-----------+----------+--------------+ CFV      None           No       No                   Acute          +---------+---------------+---------+-----------+----------+--------------+ SFJ      None                                         Acute          +---------+---------------+---------+-----------+----------+--------------+ FV Prox  None           No       No                   Acute          +---------+---------------+---------+-----------+----------+--------------+ FV Mid   None           No       No                   Acute          +---------+---------------+---------+-----------+----------+--------------+ FV DistalPartial        No       No                   Acute          +---------+---------------+---------+-----------+----------+--------------+ PFV      None           No       No                   Acute          +---------+---------------+---------+-----------+----------+--------------+ POP      Partial        No       Yes                  Acute          +---------+---------------+---------+-----------+----------+--------------+ PTV      Full                                                        +---------+---------------+---------+-----------+----------+--------------+ PERO     Partial        No       No                   Acute          +---------+---------------+---------+-----------+----------+--------------+     Summary: RIGHT: - No evidence of common femoral vein obstruction.   LEFT: - Findings consistent with acute deep vein thrombosis involving the left common femoral vein, SF junction, left femoral vein, left proximal profunda vein, left popliteal vein,  and left peroneal veins.  -  No cystic structure found in the popliteal fossa.  *See table(s) above for measurements and observations. Electronically signed by Angela Kell MD on 05/29/2023 at 4:04:22 PM.    Final    CT Angio Chest PE W and/or Wo Contrast Result Date: 05/28/2023 CLINICAL DATA:  History of prostate cancer, groin pain, lower extremity edema from thigh down to feet since yesterday. Left testicle pain but no swelling. PE suspected. EXAM: CT ANGIOGRAPHY CHEST CT ABDOMEN AND PELVIS WITH CONTRAST TECHNIQUE: Multidetector CT imaging of the chest was performed using the standard protocol during bolus administration of intravenous contrast. Multiplanar CT image reconstructions and MIPs were obtained to evaluate the vascular anatomy. Multidetector CT imaging of the abdomen and pelvis was performed using the standard protocol during bolus administration of intravenous contrast. RADIATION DOSE REDUCTION: This exam was performed according to the departmental dose-optimization program which includes automated exposure control, adjustment of the mA and/or kV according to patient size and/or use of iterative reconstruction technique. CONTRAST:  75mL OMNIPAQUE  IOHEXOL  350 MG/ML SOLN COMPARISON:  Scrotal ultrasound earlier today; PET/CT 10/12/2022; CT abdomen pelvis 08/10/2020 FINDINGS: CTA CHEST FINDINGS Cardiovascular: No pericardial effusion. Coronary artery and aortic atherosclerotic calcification. Normal caliber thoracic aorta. Negative for acute pulmonary embolism. Mediastinum/Nodes: Small hiatal hernia. Trachea is patent. Normal thyroid . No thoracic adenopathy. Lungs/Pleura: No focal consolidation, pleural effusion, or pneumothorax. Atelectasis in the lingula. Musculoskeletal: No acute fracture or suspicious osseous lesion. Review of the MIP images confirms the above findings. CT ABDOMEN and PELVIS FINDINGS Hepatobiliary: Cholelithiasis. No evidence of acute cholecystitis. Unremarkable liver and biliary tree.  Pancreas: Fatty atrophy.  No acute abnormality. Spleen: Unremarkable. Adrenals/Urinary Tract: Normal adrenal glands. Right renal cyst not requiring follow-up. No urinary calculi or hydronephrosis. Unremarkable bladder. Stomach/Bowel: Normal caliber large and small bowel. No bowel wall thickening. Small hiatal hernia. Stomach is otherwise unremarkable. The appendix is not visualized. Vascular/Lymphatic: Aortic atherosclerosis. No enlarged abdominal or pelvic lymph nodes. Reproductive: Prostatectomy. The abnormality seen on same-day scrotal ultrasound is not visualized via CT. Penile prosthesis with reservoir in the right lower quadrant. Other: Stranding and soft tissue thickening throughout the omentum has slowly increased compared to prior exams and was mildly avid on PET/CT 10/12/2022. No ascites. No free intraperitoneal air. Musculoskeletal: Subcutaneous edema in the visualized portion of the proximal left thigh. No acute fracture or destructive osseous lesion. Review of the MIP images confirms the above findings. IMPRESSION: 1. Negative for acute pulmonary embolism. 2. Stranding and soft tissue thickening throughout the omentum has compared to prior exams and was mildly radiotracer avid on PET/CT 10/12/2022. 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 3. Cholelithiasis. 4. Nonspecific subcutaneous edema in the visualized portion of the proximal left thigh. 5. Aortic Atherosclerosis (ICD10-I70.0). Electronically Signed   By: Rozell Cornet M.D.   On: 05/28/2023 20:55   CT ABDOMEN PELVIS W CONTRAST Result Date: 05/28/2023 CLINICAL DATA:  History of prostate cancer, groin pain, lower extremity edema from thigh down to feet since yesterday. Left testicle pain but no swelling. PE suspected. EXAM: CT ANGIOGRAPHY CHEST CT ABDOMEN AND PELVIS WITH CONTRAST TECHNIQUE: Multidetector CT imaging of the chest was performed using the standard protocol during bolus administration of intravenous contrast.  Multiplanar CT image reconstructions and MIPs were obtained to evaluate the vascular anatomy. Multidetector CT imaging of the abdomen and pelvis was performed using the standard protocol during bolus administration of intravenous contrast. RADIATION DOSE REDUCTION: This exam was performed according to the departmental dose-optimization program which includes automated exposure control, adjustment of the  mA and/or kV according to patient size and/or use of iterative reconstruction technique. CONTRAST:  75mL OMNIPAQUE  IOHEXOL  350 MG/ML SOLN COMPARISON:  Scrotal ultrasound earlier today; PET/CT 10/12/2022; CT abdomen pelvis 08/10/2020 FINDINGS: CTA CHEST FINDINGS Cardiovascular: No pericardial effusion. Coronary artery and aortic atherosclerotic calcification. Normal caliber thoracic aorta. Negative for acute pulmonary embolism. Mediastinum/Nodes: Small hiatal hernia. Trachea is patent. Normal thyroid . No thoracic adenopathy. Lungs/Pleura: No focal consolidation, pleural effusion, or pneumothorax. Atelectasis in the lingula. Musculoskeletal: No acute fracture or suspicious osseous lesion. Review of the MIP images confirms the above findings. CT ABDOMEN and PELVIS FINDINGS Hepatobiliary: Cholelithiasis. No evidence of acute cholecystitis. Unremarkable liver and biliary tree. Pancreas: Fatty atrophy.  No acute abnormality. Spleen: Unremarkable. Adrenals/Urinary Tract: Normal adrenal glands. Right renal cyst not requiring follow-up. No urinary calculi or hydronephrosis. Unremarkable bladder. Stomach/Bowel: Normal caliber large and small bowel. No bowel wall thickening. Small hiatal hernia. Stomach is otherwise unremarkable. The appendix is not visualized. Vascular/Lymphatic: Aortic atherosclerosis. No enlarged abdominal or pelvic lymph nodes. Reproductive: Prostatectomy. The abnormality seen on same-day scrotal ultrasound is not visualized via CT. Penile prosthesis with reservoir in the right lower quadrant. Other:  Stranding and soft tissue thickening throughout the omentum has slowly increased compared to prior exams and was mildly avid on PET/CT 10/12/2022. No ascites. No free intraperitoneal air. Musculoskeletal: Subcutaneous edema in the visualized portion of the proximal left thigh. No acute fracture or destructive osseous lesion. Review of the MIP images confirms the above findings. IMPRESSION: 1. Negative for acute pulmonary embolism. 2. Stranding and soft tissue thickening throughout the omentum has compared to prior exams and was mildly radiotracer avid on PET/CT 10/12/2022. 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 3. Cholelithiasis. 4. Nonspecific subcutaneous edema in the visualized portion of the proximal left thigh. 5. Aortic Atherosclerosis (ICD10-I70.0). Electronically Signed   By: Rozell Cornet M.D.   On: 05/28/2023 20:55   US  SCROTUM W/DOPPLER Result Date: 05/28/2023 CLINICAL DATA:  Left testicle pain EXAM: SCROTAL ULTRASOUND DOPPLER ULTRASOUND OF THE TESTICLES TECHNIQUE: Complete ultrasound examination of the testicles, epididymis, and other scrotal structures was performed. Color and spectral Doppler ultrasound were also utilized to evaluate blood flow to the testicles. COMPARISON:  PET CT 10/12/2022 FINDINGS: Right testicle Measurements: 3.8 x 1.9 x 3 cm. Abnormal in appearance, heterogenous echotexture, focal heterogeneity measuring 3 x 1.5 x 2.4 cm. On cine clips, soft tissue echogenicity present within the scrotum. Left testicle Measurements:  3.2 x 1.7 x 2.5 cm.  Small cyst measuring 4 mm Right epididymis:  Poorly demonstrated Left epididymis:  Normal in size and appearance. Hydrocele:  None visualized. Varicocele:  None visualized. Pulsed Doppler interrogation of both testes demonstrates normal low resistance arterial and venous waveforms bilaterally. IMPRESSION: 1. Negative for left testicular torsion. 2. Abnormal appearance of the right testis and scrotum. Heterogeneous appearing  right testicle with focal hypoechoic area measuring up to 3 cm, question due to remote insult, though slightly masslike on some of the images. Abnormal hypoechoic tissue or material within the scrotum potentially postsurgical in nature, correlate with physical exam and surgical history. Urology follow-up with short-term sonographic follow-up if deemed clinically appropriate. Electronically Signed   By: Esmeralda Hedge M.D.   On: 05/28/2023 19:45    Microbiology: Results for orders placed or performed in visit on 04/10/23  Urine Culture     Status: None   Collection Time: 04/10/23  9:38 AM   Specimen: Urine  Result Value Ref Range Status   MICRO NUMBER: [id number]  Final  SPECIMEN QUALITY: Adequate  Final   Sample Source URINE  Final   STATUS: FINAL  Final   Result:   Final    Less than 10,000 CFU/mL of single Gram positive organism isolated. No further testing will be performed. If clinically indicated, recollection using a method to minimize contamination, with prompt transfer to Urine Culture Transport Tube, is recommended.    Labs: CBC: Recent Labs  Lab 05/28/23 1642 05/29/23 0547 05/30/23 0425  WBC 7.0 6.9 6.1  HGB 12.6* 11.7* 10.9*  HCT 40.1 34.9* 32.6*  MCV 102.0* 98.3 97.0  PLT 166 136* 131*   Basic Metabolic Panel: Recent Labs  Lab 05/28/23 1642 05/29/23 0547 05/30/23 0425  NA 136 136 137  K 4.5 4.1 4.0  CL 108 110 108  CO2 20* 20* 19*  GLUCOSE 289* 175* 164*  BUN 24* 20 22  CREATININE 1.83* 1.59* 1.61*  CALCIUM  8.9 8.5* 8.5*   Liver Function Tests: Recent Labs  Lab 05/28/23 1642 05/29/23 0547  AST 24 11*  ALT 16 17  ALKPHOS 85 77  BILITOT 1.5* 1.3*  PROT 6.7 6.2*  ALBUMIN 3.8 3.4*   CBG: Recent Labs  Lab 05/29/23 1634 05/29/23 1735 05/29/23 2148 05/30/23 0625 05/30/23 1141  GLUCAP 204* 188* 198* 176* 181*    Discharge time spent: greater than 30 minutes.  Signed: Danette Duos, MD [facility] 05/30/2023

## 2023-05-30 NOTE — Discharge Instructions (Addendum)
 Wear thigh high compression daily.  May remove for sleep at night Elevation of the left LE when at rest Walk for exercise    Information on my medicine - ELIQUIS (apixaban)  This medication education was reviewed with me or my healthcare representative as part of my discharge preparation.   ' Why was Eliquis prescribed for you? Eliquis was prescribed to treat blood clots that may have been found in the veins of your legs (deep vein thrombosis) or in your lungs (pulmonary embolism) and to reduce the risk of them occurring again.  What do You need to know about Eliquis ? The starting dose is 10 mg (two 5 mg tablets) taken TWICE daily for the FIRST SEVEN (7) DAYS, then on Jun 06, 2023 (06/06/2023)   the dose is reduced to ONE 5 mg tablet taken TWICE daily.  Eliquis may be taken with or without food.   Try to take the dose about the same time in the morning and in the evening. If you have difficulty swallowing the tablet whole please discuss with your pharmacist how to take the medication safely.  Take Eliquis exactly as prescribed and DO NOT stop taking Eliquis without talking to the doctor who prescribed the medication.  Stopping may increase your risk of developing a new blood clot.  Refill your prescription before you run out.  After discharge, you should have regular check-up appointments with your healthcare provider that is prescribing your Eliquis.    What do you do if you miss a dose? If a dose of ELIQUIS is not taken at the scheduled time, take it as soon as possible on the same day and twice-daily administration should be resumed. The dose should not be doubled to make up for a missed dose.  Important Safety Information A possible side effect of Eliquis is bleeding. You should call your healthcare provider right away if you experience any of the following: Bleeding from an injury or your nose that does not stop. Unusual colored urine (red or dark brown) or unusual colored  stools (red or black). Unusual bruising for unknown reasons. A serious fall or if you hit your head (even if there is no bleeding).  Some medicines may interact with Eliquis and might increase your risk of bleeding or clotting while on Eliquis. To help avoid this, consult your healthcare provider or pharmacist prior to using any new prescription or non-prescription medications, including herbals, vitamins, non-steroidal anti-inflammatory drugs (NSAIDs) and supplements.  This website has more information on Eliquis (apixaban): http://www.eliquis.com/eliquis/home

## 2023-05-31 ENCOUNTER — Telehealth: Payer: Self-pay | Admitting: *Deleted

## 2023-05-31 NOTE — Transitions of Care (Post Inpatient/ED Visit) (Signed)
 05/31/2023  Name: Rodney [name] MRN: [id number] DOB: 02-26-1940  Today's TOC FU Call Status: Today's TOC FU Call Status:: Successful TOC FU Call Completed TOC FU Call Complete Date: 05/31/23 Patient's Name and Date of Birth confirmed.  Transition Care Management Follow-up Telephone Call Date of Discharge: 05/30/23 Discharge Facility: [facility] Type of Discharge: Inpatient Admission Primary Inpatient Discharge Diagnosis:: DVT (deep venous thrombosis) How have you been since you were released from the hospital?:  (eating & drinking well, no issues with bowel/ bladder, ambulating without difficulty, wearing compression hose to LLE) Any questions or concerns?: No  Items Reviewed: Did you receive and understand the discharge instructions provided?: Yes Medications obtained,verified, and reconciled?: Yes (Medications Reviewed) Any new allergies since your discharge?: No Dietary orders reviewed?: Yes Type of Diet Ordered:: low sodium heart healthy Do you have support at home?: Yes People in Home [RPT]: spouse Name of Support/Comfort Primary Source: Jerryl Morin New medication ~ Eliquis- Reviewed signs/ symptoms bleeding, reportable signs/ symptoms Reviewed signs/symptoms DVT  Medications Reviewed Today: Medications Reviewed Today     Reviewed by Daralyn Earl, RN (Registered Nurse) on 05/31/23 at 1418  Med List Status: <None>   Medication Order Taking? Sig Documenting Provider Last Dose Status Informant  amLODipine  (NORVASC ) 5 MG tablet [id number] Yes Take 1 tablet (5 mg total) by mouth 2 (two) times daily. Christel Cousins, MD Taking Active Self  APIXABAN Herby Lolling) VTE STARTER PACK (10MG  AND 5MG ) [id number] Yes Take as directed on package: start with two-5mg  tablets twice daily for 7 days. On day 8, switch to one-5mg  tablet twice daily. Regalado, Belkys A, MD Taking Active   aspirin  EC 81 MG tablet [id number] Yes Take 1 tablet (81 mg total) by mouth daily. Swallow whole. Regalado,  Belkys A, MD Taking Active   atorvastatin  (LIPITOR) 20 MG tablet [id number] Yes TAKE 1 TABLET BY MOUTH AT BEDTIME . APPOINTMENT REQUIRED FOR FUTURE REFILLS  Patient taking differently: Take 20 mg by mouth at bedtime.   Christel Cousins, MD Taking Active Self  dorzolamide -timolol  (COSOPT ) 22.3-6.8 MG/ML ophthalmic solution [id number] Yes Place 1 drop into both eyes every morning. [provider] Taking Active Self           Med Note Harlow Lighter, APRIL   Tue Mar 06, 2018  9:43 AM)    glipiZIDE  (GLUCOTROL ) 5 MG tablet [id number] Yes Take 1 tablet (5 mg total) by mouth daily before breakfast. Christel Cousins, MD Taking Active Self  glucose blood (ACCU-CHEK AVIVA) test strip [id number] Yes 1 each by Other route 2 (two) times daily. Hillery Lown, MD Taking Active   JARDIANCE  10 MG TABS tablet [id number] Yes TAKE 1 TABLET BY MOUTH ONCE DAILY BEFORE BREAKFAST Christel Cousins, MD Taking Active Self  polyethylene glycol (MIRALAX / GLYCOLAX) 17 g packet [id number] No Take 17 g by mouth daily.  Patient not taking: Reported on 05/31/2023   Catharine Clock A, MD Not Taking Active   Semaglutide  (RYBELSUS ) 3 MG TABS [id number] Yes Take 1 tablet (3 mg total) by mouth daily. Christel Cousins, MD Taking Active            Med Note Guido Leeks, Danney Dutton May 29, 2023  8:03 PM) The patient is unsure if he takes this  senna-docusate (SENOKOT-S) 8.6-50 MG tablet [id number] No Take 1 tablet by mouth 2 (two) times daily.  Patient not taking: Reported on 05/31/2023   Regalado, Belkys A, MD Not Taking Active  Home Care and Equipment/Supplies: Were Home Health Services Ordered?: No Any new equipment or medical supplies ordered?: No  Functional Questionnaire: Do you need assistance with bathing/showering or dressing?: No Do you need assistance with meal preparation?: No Do you need assistance with eating?: No Do you have difficulty maintaining continence: No Do you need assistance with getting out  of bed/getting out of a chair/moving?: No Do you have difficulty managing or taking your medications?: No  Follow up appointments reviewed: PCP Follow-up appointment confirmed?: Yes Date of PCP follow-up appointment?: 07/04/23 (pt declines to have sooner appointment scheduled) Follow-up Provider: Glenetta Lane  @ 1030 am Specialist Hospital Follow-up appointment confirmed?: Yes Date of Specialist follow-up appointment?: 07/05/23 Follow-Up Specialty Provider:: Vascular surgery- Angela Kell @ 920 am Do you need transportation to your follow-up appointment?: No Do you understand care options if your condition(s) worsen?: Yes-patient verbalized understanding  SDOH Interventions Today    Flowsheet Row Most Recent Value  SDOH Interventions   Food Insecurity Interventions Intervention Not Indicated  Housing Interventions Intervention Not Indicated  Transportation Interventions Intervention Not Indicated  Utilities Interventions Intervention Not Indicated      Cecilie Coffee [facility], BSN RN Care Manager/ Transition of Care [facility] Population Health [phone]

## 2023-05-31 NOTE — TOC Transition Note (Signed)
 Transition of Care [facility]) - Discharge Note Sherin Dingwall RN, BSN Transitions of Care Unit 4E- RN Case Manager See Treatment Team for direct phone #   Patient Details  Name: Rodney [name] MRN: [id number] Date of Birth: Oct 22, 1940  [facility] CM/SW Contact:  Rox Cope, RN Phone Number: 05/31/2023, 10:01 AM   Clinical Narrative:    Pt stable for transition home today. New to Eliquis. Per benefits check copay $47   CM spoke with pt at bedside- discussed copay cost for Eliqius- pt voiced he was aware of cost and agreeable (wife also on Eliqius, pt states he was familiar with copay, etc).  TOC pharmacy will fill starter pack with 30 day free coupon   No further TOC needs noted. Pt voiced wife to transport home.    Final next level of care: Home/Self Care Barriers to Discharge: No Barriers Identified   Patient Goals and CMS Choice Patient states their goals for this hospitalization and ongoing recovery are:: retur home   Choice offered to / list presented to : NA      Discharge Placement                 Home      Discharge Plan and Services Additional resources added to the After Visit Summary for     Discharge Planning Services: CM Consult, Medication Assistance Post Acute Care Choice: NA          DME Arranged: N/A DME Agency: NA       HH Arranged: NA HH Agency: NA        Social Drivers of Health (SDOH) Interventions SDOH Screenings   Food Insecurity: No Food Insecurity (05/28/2023)  Housing: Low Risk  (05/28/2023)  Transportation Needs: No Transportation Needs (05/28/2023)  Utilities: Not At Risk (05/28/2023)  Alcohol  Screen: Low Risk  (04/03/2023)  Depression (PHQ2-9): Low Risk  (04/03/2023)  Financial Resource Strain: Low Risk  (04/03/2023)  Physical Activity: Inactive (04/03/2023)  Social Connections: Socially Integrated (05/28/2023)  Stress: No Stress Concern Present (04/03/2023)  Tobacco Use: Medium Risk (05/28/2023)  Health  Literacy: Adequate Health Literacy (04/03/2023)     Readmission Risk Interventions     No data to display

## 2023-06-12 ENCOUNTER — Other Ambulatory Visit (HOSPITAL_COMMUNITY): Payer: Self-pay

## 2023-06-12 ENCOUNTER — Other Ambulatory Visit: Payer: Self-pay | Admitting: Family Medicine

## 2023-06-16 ENCOUNTER — Other Ambulatory Visit: Payer: Self-pay | Admitting: *Deleted

## 2023-06-16 DIAGNOSIS — I82462 Acute embolism and thrombosis of left calf muscular vein: Secondary | ICD-10-CM

## 2023-06-23 DIAGNOSIS — H401131 Primary open-angle glaucoma, bilateral, mild stage: Secondary | ICD-10-CM | POA: Diagnosis not present

## 2023-06-28 ENCOUNTER — Other Ambulatory Visit: Payer: Self-pay | Admitting: Family Medicine

## 2023-06-28 DIAGNOSIS — I10 Essential (primary) hypertension: Secondary | ICD-10-CM

## 2023-06-29 ENCOUNTER — Other Ambulatory Visit (HOSPITAL_COMMUNITY): Payer: Self-pay

## 2023-06-29 ENCOUNTER — Other Ambulatory Visit (HOSPITAL_COMMUNITY): Payer: Self-pay | Admitting: Vascular Surgery

## 2023-06-29 ENCOUNTER — Other Ambulatory Visit: Payer: Self-pay | Admitting: Family Medicine

## 2023-06-29 DIAGNOSIS — I82402 Acute embolism and thrombosis of unspecified deep veins of left lower extremity: Secondary | ICD-10-CM

## 2023-06-29 MED ORDER — APIXABAN 5 MG PO TABS: 5.0000 mg | ORAL_TABLET | Freq: Two times a day (BID) | ORAL | 0 refills | Status: DC

## 2023-06-29 NOTE — Telephone Encounter (Unsigned)
 Copied from CRM [id number]. Topic: Clinical - Medication Refill >> Jun 29, 2023 11:11 AM Trula Gable C wrote: Medication: APIXABAN  (ELIQUIS ) VTE STARTER PACK (10MG  AND 5MG )  Has the patient contacted their pharmacy? Yes (Agent: If no, request that the patient contact the pharmacy for the refill. If patient does not wish to contact the pharmacy document the reason why and proceed with request.) (Agent: If yes, when and what did the pharmacy advise?)  This is the patient's preferred pharmacy:  [facility] [address], [place] - [address] [place] [address] Phone: [phone] Fax: [phone]   Is this the correct pharmacy for this prescription? Yes If no, delete pharmacy and type the correct one.   Has the prescription been filled recently? No  Is the patient out of the medication? Yes  Has the patient been seen for an appointment in the last year OR does the patient have an upcoming appointment? Yes  Can we respond through MyChart? Yes  Agent: Please be advised that Rx refills may take up to 3 business days. We ask that you follow-up with your pharmacy.

## 2023-07-04 ENCOUNTER — Ambulatory Visit (INDEPENDENT_AMBULATORY_CARE_PROVIDER_SITE_OTHER): Payer: Medicare Other | Admitting: Family Medicine

## 2023-07-04 ENCOUNTER — Encounter: Payer: Self-pay | Admitting: Family Medicine

## 2023-07-04 ENCOUNTER — Ambulatory Visit: Payer: Self-pay | Admitting: Family Medicine

## 2023-07-04 VITALS — BP 120/68 | HR 73 | Temp 97.9°F | Resp 18 | Ht 68.0 in | Wt 221.2 lb

## 2023-07-04 DIAGNOSIS — E785 Hyperlipidemia, unspecified: Secondary | ICD-10-CM | POA: Diagnosis not present

## 2023-07-04 DIAGNOSIS — Z7984 Long term (current) use of oral hypoglycemic drugs: Secondary | ICD-10-CM

## 2023-07-04 DIAGNOSIS — N1832 Chronic kidney disease, stage 3b: Secondary | ICD-10-CM | POA: Diagnosis not present

## 2023-07-04 DIAGNOSIS — I10 Essential (primary) hypertension: Secondary | ICD-10-CM

## 2023-07-04 DIAGNOSIS — E1122 Type 2 diabetes mellitus with diabetic chronic kidney disease: Secondary | ICD-10-CM

## 2023-07-04 DIAGNOSIS — N1831 Chronic kidney disease, stage 3a: Secondary | ICD-10-CM | POA: Diagnosis not present

## 2023-07-04 DIAGNOSIS — I82462 Acute embolism and thrombosis of left calf muscular vein: Secondary | ICD-10-CM | POA: Diagnosis not present

## 2023-07-04 LAB — MICROALBUMIN / CREATININE URINE RATIO
Creatinine,U: 62.4 mg/dL
Microalb Creat Ratio: 20.5 mg/g (ref 0.0–30.0)
Microalb, Ur: 1.3 mg/dL (ref 0.0–1.9)

## 2023-07-04 MED ORDER — AMLODIPINE BESYLATE 5 MG PO TABS: 5.0000 mg | ORAL_TABLET | Freq: Two times a day (BID) | ORAL | 1 refills | Status: DC

## 2023-07-04 MED ORDER — ATORVASTATIN CALCIUM 20 MG PO TABS: 20.0000 mg | ORAL_TABLET | Freq: Every day | ORAL | 3 refills | Status: AC

## 2023-07-04 MED ORDER — EMPAGLIFLOZIN 10 MG PO TABS: 10.0000 mg | ORAL_TABLET | Freq: Every day | ORAL | 0 refills | Status: DC

## 2023-07-04 MED ORDER — GLIPIZIDE 5 MG PO TABS
5.0000 mg | ORAL_TABLET | Freq: Every day | ORAL | 1 refills | Status: AC
Start: 2023-07-04 — End: ?

## 2023-07-04 NOTE — Progress Notes (Signed)
 Subjective:     Patient ID: Rodney [name], male    DOB: 10/16/1940, 83 y.o.   MRN: [id number]  Chief Complaint  Patient presents with   Medical Management of Chronic Issues    6 month follow-up Not fasting     HPI Discussed the use of AI scribe software for clinical note transcription with the patient, who gave verbal consent to proceed.  History of Present Illness JAC ROMULUS is an 83 year old male with diabetes who presents for follow-up on his diabetes and recent hospitalization for blood clots. He is accompanied by his wife.  He was hospitalized on May 4th for blood clots in his left leg and underwent a thrombectomy. He was started on heparin  in the hospital and transitioned to Eliquis  upon discharge. He is unsure about the duration of Eliquis  therapy and is scheduled for an ultrasound tomorrow. He experiences excessive bruising. But no bleeding  He is currently taking amlodipine  5 mg and losartan  50 mg for blood pressure management, with generally good blood pressure readings. No new chest pain or dizziness, but he has persistent shortness of breath. He has not seen a cardiologist regularly since 2020.  He experiences back pain after standing for 15 minutes, which resolves upon sitting. Despite these issues, he remains active and enjoys fishing.  For diabetes management, he is taking Jardiance , glipizide  5 mg once daily,. He recently refilled his medications and has a 90-day supply of Jardiance  at home.  Doesn't recall ever filling rybelsys  During a recent hospital stay, a CT scan revealed concerning findings in the abdominal area, and he has an upcoming appointment with Dr. Rozanne Corners to discuss these findings further-urologist.    Health Maintenance Due  Topic Date Due   FOOT EXAM  06/30/2023    Past Medical History:  Diagnosis Date   Bladder calculus    CAD (coronary artery disease)    non obs    CKD (chronic kidney disease), stage III (HCC)    " i have  35-40% of my kidney function"; mgd by pcp    COLONIC POLYPS, HX OF 12/02/2008   DIABETES MELLITUS, TYPE II 07/19/2007   Dyspnea    reports sob over the last 2 years , underwent cath  in feb 2020 , reports cath was clean , referred to pulmonologist that stopped his Actos  and encouraged weight loss  ; reports today minimum improvement in breathing , appt with pulm in June 2020   Erectile dysfunction    GERD (gastroesophageal reflux disease)    Glaucoma    GOUT 07/19/2007   History of kidney stones    HYPERLIPIDEMIA 12/02/2008   HYPERTENSION 07/19/2007   Kidney stones    Prostate cancer (HCC) 2007   Reiter syndrome    incomplete   TIA (transient ischemic attack)    Tubular adenoma of colon    Vitamin D  deficiency     Past Surgical History:  Procedure Laterality Date   bladder neck dilation     CATARACT EXTRACTION     both eyes- 2015   COLONOSCOPY  11/15/2021   12-11-14-polyps   CYSTOSCOPY N/A 06/11/2018   Procedure: CYSTOSCOPY WITH REMOVAL OF BLADDER CALCULUS;  Surgeon: Florencio Hunting, MD;  Location: [facility];  Service: Urology;  Laterality: N/A;   ELBOW SURGERY     bone spur - right   LEFT HEART CATH AND CORONARY ANGIOGRAPHY N/A 03/19/2018   Procedure: LEFT HEART CATH AND CORONARY ANGIOGRAPHY;  Surgeon: Arty Binning, MD;  Location: [facility] INVASIVE CV LAB;  Service: Cardiovascular;  Laterality: N/A;   LOWER EXTREMITY VENOGRAPHY Left 05/29/2023   Procedure: LOWER EXTREMITY VENOGRAPHY;  Surgeon: Adine Hoof, MD;  Location: [facility] INVASIVE CV LAB;  Service: Cardiovascular;  Laterality: Left;   ORIF ANKLE FRACTURE Left 05/17/2015   Procedure: OPEN REDUCTION INTERNAL FIXATION (ORIF) ANKLE FRACTURE;  Surgeon: Jasmine Mesi, MD;  Location: [facility];  Service: Orthopedics;  Laterality: Left;   PERIPHERAL VASCULAR THROMBECTOMY Left 05/29/2023   Procedure: PERIPHERAL VASCULAR THROMBECTOMY;  Surgeon: Adine Hoof, MD;  Location: [facility] INVASIVE CV LAB;  Service: Cardiovascular;   Laterality: Left;   PROSTATECTOMY  2007   URINARY SPHINCTER IMPLANT N/A 05/21/2019   Procedure: IMPLANTATION OF ARTIFICIAL URINARY SPHINCTER CYSTOSCOPY;  Surgeon: Erman Hayward, MD;  Location: [facility];  Service: Urology;  Laterality: N/A;   VASECTOMY       Current Outpatient Medications:    apixaban  (ELIQUIS ) 5 MG TABS tablet, Take 1 tablet (5 mg total) by mouth 2 (two) times daily., Disp: 60 tablet, Rfl: 0   aspirin  EC 81 MG tablet, Take 1 tablet (81 mg total) by mouth daily. Swallow whole., Disp: 30 tablet, Rfl: 12   dorzolamide -timolol  (COSOPT ) 22.3-6.8 MG/ML ophthalmic solution, Place 1 drop into both eyes every morning., Disp: , Rfl:    glucose blood (ACCU-CHEK AVIVA) test strip, 1 each by Other route 2 (two) times daily., Disp: 100 each, Rfl: 3   losartan  (COZAAR ) 50 MG tablet, Take 50 mg by mouth at bedtime., Disp: , Rfl:    polyethylene glycol (MIRALAX  / GLYCOLAX ) 17 g packet, Take 17 g by mouth daily., Disp: 14 each, Rfl: 0   senna-docusate (SENOKOT-S) 8.6-50 MG tablet, Take 1 tablet by mouth 2 (two) times daily., Disp: 30 tablet, Rfl: 1   amLODipine  (NORVASC ) 5 MG tablet, Take 1 tablet (5 mg total) by mouth 2 (two) times daily., Disp: 180 tablet, Rfl: 1   atorvastatin  (LIPITOR) 20 MG tablet, Take 1 tablet (20 mg total) by mouth at bedtime., Disp: 90 tablet, Rfl: 3   empagliflozin  (JARDIANCE ) 10 MG TABS tablet, Take 1 tablet (10 mg total) by mouth daily before breakfast., Disp: 90 tablet, Rfl: 0   glipiZIDE  (GLUCOTROL ) 5 MG tablet, Take 1 tablet (5 mg total) by mouth daily before breakfast., Disp: 90 tablet, Rfl: 1  Allergies  Allergen Reactions   Benazepril Shortness Of Breath, Swelling and Other (See Comments)    Tongue became swollen- had to come to the ED   ROS neg/noncontributory except as noted HPI/below      Objective:      BP 120/68   Pulse 73   Temp 97.9 F (36.6 C) (Temporal)   Resp 18   Ht 5\' 8"  (1.727 m)   Wt 221 lb 4 oz (100.4 kg)   SpO2 99%   BMI  33.64 kg/m  Wt Readings from Last 3 Encounters:  07/04/23 221 lb 4 oz (100.4 kg)  05/28/23 227 lb 1.2 oz (103 kg)  04/10/23 227 lb 4 oz (103.1 kg)    Physical Exam   Gen: WDWN NAD HEENT: NCAT, conjunctiva not injected, sclera nonicteric NECK:  supple, no thyromegaly, no nodes, no carotid bruits CARDIAC: RRR, S1S2+, no murmur. DP 2+B LUNGS: CTAB. No wheezes ABDOMEN:  BS+, soft, NTND, No HSM, no masses EXT:  tr edema MSK: no gross abnormalities.  NEURO: A&O x3.  CN II-XII intact.  PSYCH: normal mood. Good eye contact Reviewed hosp records    Assessment & Plan:  Type 2 diabetes mellitus with stage 3a chronic kidney disease, without long-term current use of insulin  (HCC) -     Microalbumin / creatinine urine ratio -     Empagliflozin ; Take 1 tablet (10 mg total) by mouth daily before breakfast.  Dispense: 90 tablet; Refill: 0  Essential hypertension -     amLODIPine  Besylate; Take 1 tablet (5 mg total) by mouth 2 (two) times daily.  Dispense: 180 tablet; Refill: 1  Dyslipidemia -     Atorvastatin  Calcium ; Take 1 tablet (20 mg total) by mouth at bedtime.  Dispense: 90 tablet; Refill: 3  Type 2 diabetes mellitus with stage 3b chronic kidney disease, without long-term current use of insulin  (HCC) -     glipiZIDE ; Take 1 tablet (5 mg total) by mouth daily before breakfast.  Dispense: 90 tablet; Refill: 1  Acute deep vein thrombosis (DVT) of calf muscle vein of left lower extremity (HCC)  Long term current use of oral hypoglycemic drug  Assessment and Plan Assessment & Plan Deep Vein Thrombosis (DVT)   He was recently hospitalized on May 4th for blood clots in the left leg, treated with thrombectomy and stent placement. He is currently on Eliquis , with no specified duration for anticoagulation therapy. He experiences bruising but no excessive bleeding. There is potential for lifelong anticoagulation due to the severity and spontaneous nature of the clotting event. Continue Eliquis   as prescribed. Clarify the duration of anticoagulation therapy with the vascular doctor. A follow-up ultrasound will assess DVT status.  Type 2 Diabetes Mellitus  w/stage 3b CKD His diabetes is managed with Jardiance , glipizide ,. There is a discussion about switching to Ozempic for better kidney protection and glycemic control. He is open to starting Ozempic despite cost concerns, understanding potential side effects such as nausea, constipation, or diarrhea. Ozempic is administered as a weekly injection, with a starter pen provided to initiate therapy. Start Ozempic with the starter pen provided. Monitor for side effects and adjust the doseContact the provider in four weeks to send the Ozempic 0.5 mg prescription to the pharmacy.  Demo'd pen  Hypertension   His blood pressure is well-controlled with amlodipine  and losartan . Continue the current antihypertensive regimen.  Shortness of Breath   He experiences chronic shortness of breath, possibly related to age, deconditioning, or cardiac issues. He reports being limited in physical activity due to shortness of breath and back pain. Further evaluation may be needed if symptoms worsen. Consider a cardiology referral if symptoms worsen.  Prostate Concerns   A CT scan during his recent hospitalization revealed concerns in the prostate area. A follow-up with Dr. Rozanne Corners is scheduled to discuss findings and management plan. Attend the appointment with Dr. Rozanne Corners for further evaluation.  General Health Maintenance   There was a discussion about the need for urine microalb/creat ratio which have not been done since 2023. Order a routine urine test.    Return in about 3 months (around 10/04/2023) for chronic follow-up.  Ellsworth Haas, MD

## 2023-07-04 NOTE — Patient Instructions (Addendum)
 It was very nice to see you today!  Message in 4 weeks to send the ozempic to pharmacy 0.5mg    PLEASE NOTE:  If you had any lab tests please let us  know if you have not heard back within a few days. You may see your results on MyChart before we have a chance to review them but we will give you a call once they are reviewed by us . If we ordered any referrals today, please let us  know if you have not heard from their office within the next week.   Please try these tips to maintain a healthy lifestyle:  Eat most of your calories during the day when you are active. Eliminate processed foods including packaged sweets (pies, cakes, cookies), reduce intake of potatoes, white bread, white pasta, and white rice. Look for whole grain options, oat flour or almond flour.  Each meal should contain half fruits/vegetables, one quarter protein, and one quarter carbs (no bigger than a computer mouse).  Cut down on sweet beverages. This includes juice, soda, and sweet tea. Also watch fruit intake, though this is a healthier sweet option, it still contains natural sugar! Limit to 3 servings daily.  Drink at least 1 glass of water  with each meal and aim for at least 8 glasses per day  Exercise at least 150 minutes every week.

## 2023-07-04 NOTE — Progress Notes (Signed)
 No problem with protein in urine

## 2023-07-05 ENCOUNTER — Ambulatory Visit (HOSPITAL_COMMUNITY)
Admission: RE | Admit: 2023-07-05 | Discharge: 2023-07-05 | Disposition: A | Discharge status: Home / Self Care | Source: Ambulatory Visit | Attending: Vascular Surgery | Admitting: Vascular Surgery

## 2023-07-05 ENCOUNTER — Ambulatory Visit (INDEPENDENT_AMBULATORY_CARE_PROVIDER_SITE_OTHER): Admitting: Vascular Surgery

## 2023-07-05 ENCOUNTER — Encounter: Payer: Self-pay | Admitting: Vascular Surgery

## 2023-07-05 VITALS — BP 117/72 | HR 70 | Temp 97.9°F | Ht 68.0 in | Wt 220.0 lb

## 2023-07-05 DIAGNOSIS — I871 Compression of vein: Secondary | ICD-10-CM

## 2023-07-05 DIAGNOSIS — I82402 Acute embolism and thrombosis of unspecified deep veins of left lower extremity: Secondary | ICD-10-CM | POA: Diagnosis not present

## 2023-07-05 DIAGNOSIS — I82462 Acute embolism and thrombosis of left calf muscular vein: Secondary | ICD-10-CM | POA: Diagnosis not present

## 2023-07-05 MED ORDER — OZEMPIC (0.25 OR 0.5 MG/DOSE) 2 MG/1.5ML ~~LOC~~ SOPN: 0.2500 mg | PEN_INJECTOR | SUBCUTANEOUS | Status: DC

## 2023-07-05 NOTE — Addendum Note (Signed)
 Addended by: Albertine Alpha on: 07/05/2023 04:26 PM   Modules accepted: Orders

## 2023-07-05 NOTE — Progress Notes (Signed)
 Patient ID: [name], male   DOB: [birth year], 83 y.o.   MRN: [id number]  Reason for Consult: No chief complaint on file.   Referred by Christel Cousins, MD  Subjective:     HPI:  Rodney [name] is a 83 y.o. male with history of recurrent prostate cancer who has undergone resection as well as radiation in the past.  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  States that now his leg feels much better without any recurrent swelling.  He did wear compression stockings for a few weeks but states that he no longer needs them.  He remains on Eliquis  is not taking any antiplatelet medications.  He is scheduled to see urology this summer for recurrent prostate cells and believes he might require hormone therapy.  He remains very active fishing frequently.  Past Medical History:  Diagnosis Date   Bladder calculus    CAD (coronary artery disease)    non obs    CKD (chronic kidney disease), stage III (HCC)     i have 35-40% of my kidney function; mgd by pcp    COLONIC POLYPS, HX OF 12/02/2008   DIABETES MELLITUS, TYPE II 07/19/2007   Dyspnea    reports sob over the last 2 years , underwent cath  in feb 2020 , reports cath was clean , referred to pulmonologist that stopped his Actos  and encouraged weight loss  ; reports today minimum improvement in breathing , appt with pulm in June 2020   Erectile dysfunction    GERD (gastroesophageal reflux disease)    Glaucoma    GOUT 07/19/2007   History of kidney stones    HYPERLIPIDEMIA 12/02/2008   HYPERTENSION 07/19/2007   Kidney stones    Prostate cancer (HCC) 2007   Reiter syndrome    incomplete   TIA (transient ischemic attack)    Tubular adenoma of colon    Vitamin D  deficiency    Family History  Problem Relation Age of Onset   Cancer Sister        metastatic colon ca   Colon cancer Neg Hx    Esophageal cancer Neg Hx    Rectal cancer  Neg Hx    Stomach cancer Neg Hx    Lung disease Neg Hx    Colon polyps Neg Hx    Pancreatic cancer Neg Hx    Past Surgical History:  Procedure Laterality Date   bladder neck dilation     CATARACT EXTRACTION     both eyes- 2015   COLONOSCOPY  11/15/2021   12-11-14-polyps   CYSTOSCOPY N/A 06/11/2018   Procedure: CYSTOSCOPY WITH REMOVAL OF BLADDER CALCULUS;  Surgeon: Florencio Hunting, MD;  Location: [facility];  Service: Urology;  Laterality: N/A;   ELBOW SURGERY     bone spur - right   LEFT HEART CATH AND CORONARY ANGIOGRAPHY N/A 03/19/2018   Procedure: LEFT HEART CATH AND CORONARY ANGIOGRAPHY;  Surgeon: Arty Binning, MD;  Location: [facility];  Service: Cardiovascular;  Laterality: N/A;   LOWER EXTREMITY VENOGRAPHY Left 05/29/2023   Procedure: LOWER EXTREMITY VENOGRAPHY;  Surgeon: Adine Hoof, MD;  Location: [facility] INVASIVE CV LAB;  Service: Cardiovascular;  Laterality: Left;   ORIF ANKLE FRACTURE Left 05/17/2015   Procedure: OPEN REDUCTION INTERNAL FIXATION (ORIF) ANKLE FRACTURE;  Surgeon: Jasmine Mesi, MD;  Location: [facility];  Service: Orthopedics;  Laterality: Left;   PERIPHERAL VASCULAR THROMBECTOMY Left  05/29/2023   Procedure: PERIPHERAL VASCULAR THROMBECTOMY;  Surgeon: Adine Hoof, MD;  Location: [facility] INVASIVE CV LAB;  Service: Cardiovascular;  Laterality: Left;   PROSTATECTOMY  2007   URINARY SPHINCTER IMPLANT N/A 05/21/2019   Procedure: IMPLANTATION OF ARTIFICIAL URINARY SPHINCTER CYSTOSCOPY;  Surgeon: Erman Hayward, MD;  Location: [facility];  Service: Urology;  Laterality: N/A;   VASECTOMY      Short Social History:  Social History   Tobacco Use   Smoking status: Former    Current packs/day: 0.00    Average packs/day: 1 pack/day for 30.0 years (30.0 ttl pk-yrs)    Types: Cigarettes    Start date: 01/25/1948    Quit date: 01/24/1978    Years since quitting: 45.4   Smokeless tobacco: Never  Substance Use Topics   Alcohol  use: Not Currently     Comment: occasionally -socially    Allergies  Allergen Reactions   Benazepril Shortness Of Breath, Swelling and Other (See Comments)    Tongue became swollen- had to come to the ED    Current Outpatient Medications  Medication Sig Dispense Refill   amLODipine  (NORVASC ) 5 MG tablet Take 1 tablet (5 mg total) by mouth 2 (two) times daily. 180 tablet 1   apixaban  (ELIQUIS ) 5 MG TABS tablet Take 1 tablet (5 mg total) by mouth 2 (two) times daily. 60 tablet 0   aspirin  EC 81 MG tablet Take 1 tablet (81 mg total) by mouth daily. Swallow whole. 30 tablet 12   atorvastatin  (LIPITOR) 20 MG tablet Take 1 tablet (20 mg total) by mouth at bedtime. 90 tablet 3   dorzolamide -timolol  (COSOPT ) 22.3-6.8 MG/ML ophthalmic solution Place 1 drop into both eyes every morning.     empagliflozin  (JARDIANCE ) 10 MG TABS tablet Take 1 tablet (10 mg total) by mouth daily before breakfast. 90 tablet 0   glipiZIDE  (GLUCOTROL ) 5 MG tablet Take 1 tablet (5 mg total) by mouth daily before breakfast. 90 tablet 1   glucose blood (ACCU-CHEK AVIVA) test strip 1 each by Other route 2 (two) times daily. 100 each 3   losartan  (COZAAR ) 50 MG tablet Take 50 mg by mouth at bedtime.     polyethylene glycol (MIRALAX  / GLYCOLAX ) 17 g packet Take 17 g by mouth daily. 14 each 0   senna-docusate (SENOKOT-S) 8.6-50 MG tablet Take 1 tablet by mouth 2 (two) times daily. 30 tablet 1   No current facility-administered medications for this visit.    Review of Systems  Constitutional:  Constitutional negative. HENT: HENT negative.  Eyes: Eyes negative.  Respiratory: Respiratory negative.  Cardiovascular: Cardiovascular negative.  GI: Gastrointestinal negative.  Musculoskeletal: Musculoskeletal negative.  Skin: Skin negative.  Neurological: Neurological negative. Hematologic: Hematologic/lymphatic negative.  Psychiatric: Psychiatric negative.        Objective:  Objective  Vitals:   07/05/23 0911  BP: 117/72  Pulse: 70  Temp:  97.9 F (36.6 C)  SpO2: 96%     Physical Exam HENT:     Head: Normocephalic.     Mouth/Throat:     Mouth: Mucous membranes are moist.  Eyes:     Pupils: Pupils are equal, round, and reactive to light.  Cardiovascular:     Rate and Rhythm: Normal rate.     Pulses: Normal pulses.  Pulmonary:     Effort: Pulmonary effort is normal.  Abdominal:     General: Abdomen is flat.  Musculoskeletal:     Right lower leg: No edema.     Left lower leg:  No edema.  Skin:    General: Skin is warm.     Capillary Refill: Capillary refill takes less than 2 seconds.  Neurological:     General: No focal deficit present.     Mental Status: He is alert.  Psychiatric:        Mood and Affect: Mood normal.        Thought Content: Thought content normal.        Judgment: Judgment normal.     Data: LEFT     CompressibilityPhasicitySpontaneityPropertiesThrombus  Aging  +---------+---------------+---------+-----------+----------+--------------+   CFV     Full           Yes      Yes                                   +---------+---------------+---------+-----------+----------+--------------+   SFJ     Full           Yes      Yes                                   +---------+---------------+---------+-----------+----------+--------------+   FV Prox  Full           Yes      Yes                                   +---------+---------------+---------+-----------+----------+--------------+   FV Mid   Full           Yes      Yes                                   +---------+---------------+---------+-----------+----------+--------------+   FV DistalFull           Yes      Yes                                   +---------+---------------+---------+-----------+----------+--------------+   PFV     Full                                                          +---------+---------------+---------+-----------+----------+--------------+   POP     Full            Yes      Yes                                   +---------+---------------+---------+-----------+----------+--------------+   PTV     Full           Yes      Yes                                   +---------+---------------+---------+-----------+----------+--------------+   PERO    Full           Yes      Yes                                   +---------+---------------+---------+-----------+----------+--------------+  Gastroc Full                                                          +---------+---------------+---------+-----------+----------+--------------+   GSV     Full           Yes      Yes                                   +---------+---------------+---------+-----------+----------+--------------+            Summary:  RIGHT:  - No evidence of common femoral vein obstruction.        LEFT:   - Findings suggest resolution of previously noted thrombus.  - There is no evidence of deep vein thrombosis in the lower extremity.   IVC/Iliac Findings:  +----------+------+--------+--------+    IVC    PatentThrombusComments  +----------+------+--------+--------+  IVC Prox  patent                  +----------+------+--------+--------+  IVC Distalpatent                  +----------+------+--------+--------+     Unable to visualize left common iliac vein stent, multiple technologists  attempted   +-------------------------+---------+-----------+---------+-----------+----  ----+            EIV            RT-PatentRT-ThrombusLT-PatentLT-ThrombusComments  +-------------------------+---------+-----------+---------+-----------+----  ----+  External Iliac Vein Prox                      patent                        +-------------------------+---------+-----------+---------+-----------+----  ----+  External Iliac Vein Mid                       patent                         +-------------------------+---------+-----------+---------+-----------+----  ----+  External Iliac Vein                           patent                        Distal                                                                      +-------------------------+---------+-----------+---------+-----------+----  ----+        Summary:  IVC/Iliac: There is no evidence of thrombus involving the IVC. There is no  evidence of thrombus involving the left external iliac vein. Visualization  of mid inferior vena cava, proximal common Iliac, mid common Iliac and  distal common Iliac was limited.  Area of the distal common iliac vein appears patent, however the stent was  not able to be visualized on today's study. Multiple technologists  attempted.  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  Plan is for follow-up with urology this summer possibly will require hormone therapy.  He will need anticoagulation for at least 6 months and at least antiplatelet therapy moving forward after that as it is most likely inciting factor was May Thurner syndrome.  I will have him follow-up in 5 more months we can make a determination whether or not to discontinue Eliquis  and transition to aspirin  at that time.     Adine Hoof MD [facility]

## 2023-07-06 ENCOUNTER — Other Ambulatory Visit: Payer: Self-pay | Admitting: *Deleted

## 2023-07-06 DIAGNOSIS — I82462 Acute embolism and thrombosis of left calf muscular vein: Secondary | ICD-10-CM

## 2023-07-06 DIAGNOSIS — I871 Compression of vein: Secondary | ICD-10-CM

## 2023-07-28 ENCOUNTER — Other Ambulatory Visit: Payer: Self-pay | Admitting: Family Medicine

## 2023-08-03 ENCOUNTER — Other Ambulatory Visit: Payer: Self-pay | Admitting: Family Medicine

## 2023-08-03 NOTE — Telephone Encounter (Signed)
 Copied from CRM [id number]. Topic: Clinical - Medication Refill >> Aug 03, 2023 11:03 AM Abigail D wrote: Medication: Semaglutide ,0.25 or 0.5MG /DOS, (OZEMPIC , 0.25 OR 0.5 MG/DOSE,) 2 MG/1.5ML SOPN, Patient was given a test pen and was told to notify Dr. Wendolyn when close to running out. Was working on getting it covered by insurance during test period, patient would now like a prescription.  Has the patient contacted their pharmacy? No (Agent: If no, request that the patient contact the pharmacy for the refill. If patient does not wish to contact the pharmacy document the reason why and proceed with request.) (Agent: If yes, when and what did the pharmacy advise?)  This is the patient's preferred pharmacy:  [facility] [address], [place] - [address] [place] [address] Phone: [phone] Fax: [phone]  Is this the correct pharmacy for this prescription? Yes If no, delete pharmacy and type the correct one.   Has the prescription been filled recently? Yes  Is the patient out of the medication? Yes  Has the patient been seen for an appointment in the last year OR does the patient have an upcoming appointment? Yes  Can we respond through MyChart? No  Agent: Please be advised that Rx refills may take up to 3 business days. We ask that you follow-up with your pharmacy.

## 2023-08-04 MED ORDER — OZEMPIC (0.25 OR 0.5 MG/DOSE) 2 MG/1.5ML ~~LOC~~ SOPN: 0.2500 mg | PEN_INJECTOR | SUBCUTANEOUS | Status: DC

## 2023-08-16 ENCOUNTER — Encounter: Payer: Self-pay | Admitting: *Deleted

## 2023-08-16 ENCOUNTER — Telehealth: Payer: Self-pay | Admitting: *Deleted

## 2023-08-16 ENCOUNTER — Other Ambulatory Visit (HOSPITAL_COMMUNITY): Payer: Self-pay

## 2023-08-16 ENCOUNTER — Telehealth: Payer: Self-pay

## 2023-08-16 DIAGNOSIS — C7951 Secondary malignant neoplasm of bone: Secondary | ICD-10-CM | POA: Diagnosis not present

## 2023-08-16 NOTE — Telephone Encounter (Signed)
 Patient notified of message below.

## 2023-08-16 NOTE — Telephone Encounter (Signed)
 Pharmacy Patient Advocate Encounter   Received notification from Pt Calls Messages that prior authorization for Ozempic  is required/requested.   Insurance verification completed.   The patient is insured through [employer] .   Per test claim: The current 28 day co-pay is, $0.00.  No PA needed at this time. This test claim was processed through [facility]- copay amounts may vary at other pharmacies due to pharmacy/plan contracts, or as the patient moves through the different stages of their insurance plan.

## 2023-08-16 NOTE — Telephone Encounter (Signed)
 Copied from CRM [id number]. Topic: Clinical - Prescription Issue >> Aug 16, 2023  9:31 AM Martinique E wrote: Reason for CRM: Patient called in regarding the refill status of his Semaglutide ,0.25 or 0.5MG /DOS, (OZEMPIC , 0.25 OR 0.5 MG/DOSE,) 2 MG/1.5ML SOPN, request was put in on 7/10 and patient still has not received this medication. Patient also questioning if insurance is covering this med as he cannot pay out of pocket for it.

## 2023-08-21 ENCOUNTER — Other Ambulatory Visit (HOSPITAL_COMMUNITY): Payer: Self-pay | Admitting: Urology

## 2023-08-21 DIAGNOSIS — C7951 Secondary malignant neoplasm of bone: Secondary | ICD-10-CM

## 2023-08-21 DIAGNOSIS — C61 Malignant neoplasm of prostate: Secondary | ICD-10-CM

## 2023-08-22 ENCOUNTER — Encounter (HOSPITAL_COMMUNITY)
Admission: RE | Admit: 2023-08-22 | Discharge: 2023-08-22 | Disposition: A | Discharge status: Home / Self Care | Source: Ambulatory Visit | Attending: Urology | Admitting: Urology

## 2023-08-22 DIAGNOSIS — K449 Diaphragmatic hernia without obstruction or gangrene: Secondary | ICD-10-CM | POA: Diagnosis not present

## 2023-08-22 DIAGNOSIS — C7951 Secondary malignant neoplasm of bone: Secondary | ICD-10-CM | POA: Insufficient documentation

## 2023-08-22 DIAGNOSIS — C61 Malignant neoplasm of prostate: Secondary | ICD-10-CM | POA: Diagnosis present

## 2023-08-22 DIAGNOSIS — I2510 Atherosclerotic heart disease of native coronary artery without angina pectoris: Secondary | ICD-10-CM | POA: Diagnosis not present

## 2023-08-22 DIAGNOSIS — I6523 Occlusion and stenosis of bilateral carotid arteries: Secondary | ICD-10-CM | POA: Diagnosis not present

## 2023-08-22 DIAGNOSIS — K8020 Calculus of gallbladder without cholecystitis without obstruction: Secondary | ICD-10-CM | POA: Diagnosis not present

## 2023-08-22 DIAGNOSIS — C7952 Secondary malignant neoplasm of bone marrow: Secondary | ICD-10-CM | POA: Diagnosis not present

## 2023-08-22 MED ORDER — FLOTUFOLASTAT F 18 GALLIUM 296-5846 MBQ/ML IV SOLN
8.7300 | Freq: Once | INTRAVENOUS | Status: AC
  Administered 2023-08-22: 8.73 via INTRAVENOUS

## 2023-09-12 DIAGNOSIS — H26492 Other secondary cataract, left eye: Secondary | ICD-10-CM | POA: Diagnosis not present

## 2023-09-12 DIAGNOSIS — H43821 Vitreomacular adhesion, right eye: Secondary | ICD-10-CM | POA: Diagnosis not present

## 2023-09-12 DIAGNOSIS — H401131 Primary open-angle glaucoma, bilateral, mild stage: Secondary | ICD-10-CM | POA: Diagnosis not present

## 2023-09-12 DIAGNOSIS — Z961 Presence of intraocular lens: Secondary | ICD-10-CM | POA: Diagnosis not present

## 2023-09-12 DIAGNOSIS — H353132 Nonexudative age-related macular degeneration, bilateral, intermediate dry stage: Secondary | ICD-10-CM | POA: Diagnosis not present

## 2023-09-14 ENCOUNTER — Other Ambulatory Visit: Payer: Self-pay | Admitting: Family Medicine

## 2023-09-14 DIAGNOSIS — I10 Essential (primary) hypertension: Secondary | ICD-10-CM

## 2023-09-19 DIAGNOSIS — H401131 Primary open-angle glaucoma, bilateral, mild stage: Secondary | ICD-10-CM | POA: Diagnosis not present

## 2023-09-19 DIAGNOSIS — Z9841 Cataract extraction status, right eye: Secondary | ICD-10-CM | POA: Diagnosis not present

## 2023-09-19 DIAGNOSIS — H2512 Age-related nuclear cataract, left eye: Secondary | ICD-10-CM | POA: Diagnosis not present

## 2023-10-04 ENCOUNTER — Ambulatory Visit (INDEPENDENT_AMBULATORY_CARE_PROVIDER_SITE_OTHER): Admitting: Family Medicine

## 2023-10-04 ENCOUNTER — Ambulatory Visit: Payer: Self-pay | Admitting: Family Medicine

## 2023-10-04 ENCOUNTER — Encounter: Payer: Self-pay | Admitting: Family Medicine

## 2023-10-04 VITALS — BP 121/69 | HR 73 | Temp 98.0°F | Resp 16 | Ht 68.0 in | Wt 222.2 lb

## 2023-10-04 DIAGNOSIS — C61 Malignant neoplasm of prostate: Secondary | ICD-10-CM | POA: Diagnosis not present

## 2023-10-04 DIAGNOSIS — E785 Hyperlipidemia, unspecified: Secondary | ICD-10-CM

## 2023-10-04 DIAGNOSIS — N1831 Chronic kidney disease, stage 3a: Secondary | ICD-10-CM

## 2023-10-04 DIAGNOSIS — Z86718 Personal history of other venous thrombosis and embolism: Secondary | ICD-10-CM | POA: Diagnosis not present

## 2023-10-04 DIAGNOSIS — I10 Essential (primary) hypertension: Secondary | ICD-10-CM | POA: Diagnosis not present

## 2023-10-04 DIAGNOSIS — E1122 Type 2 diabetes mellitus with diabetic chronic kidney disease: Secondary | ICD-10-CM

## 2023-10-04 DIAGNOSIS — Z7984 Long term (current) use of oral hypoglycemic drugs: Secondary | ICD-10-CM

## 2023-10-04 LAB — COMPREHENSIVE METABOLIC PANEL WITH GFR
ALT: 19 U/L (ref 0–53)
AST: 16 U/L (ref 0–37)
Albumin: 4.3 g/dL (ref 3.5–5.2)
Alkaline Phosphatase: 80 U/L (ref 39–117)
BUN: 23 mg/dL (ref 6–23)
CO2: 25 meq/L (ref 19–32)
Calcium: 9.6 mg/dL (ref 8.4–10.5)
Chloride: 107 meq/L (ref 96–112)
Creatinine, Ser: 1.72 mg/dL — ABNORMAL HIGH (ref 0.40–1.50)
GFR: 36.35 mL/min — ABNORMAL LOW (ref >60.00)
Glucose, Bld: 165 mg/dL — ABNORMAL HIGH (ref 70–99)
Potassium: 4.7 meq/L (ref 3.5–5.1)
Sodium: 138 meq/L (ref 135–145)
Total Bilirubin: 1 mg/dL (ref 0.2–1.2)
Total Protein: 7 g/dL (ref 6.0–8.3)

## 2023-10-04 LAB — CBC WITH DIFFERENTIAL/PLATELET
Basophils Absolute: 0 K/uL (ref 0.0–0.1)
Basophils Relative: 0.3 % (ref 0.0–3.0)
Eosinophils Absolute: 0.1 K/uL (ref 0.0–0.7)
Eosinophils Relative: 2.5 % (ref 0.0–5.0)
HCT: 38.8 % — ABNORMAL LOW (ref 39.0–52.0)
Hemoglobin: 12.8 g/dL — ABNORMAL LOW (ref 13.0–17.0)
Lymphocytes Relative: 13 % (ref 12.0–46.0)
Lymphs Abs: 0.7 K/uL (ref 0.7–4.0)
MCHC: 32.9 g/dL (ref 30.0–36.0)
MCV: 97.3 fl (ref 78.0–100.0)
Monocytes Absolute: 0.5 K/uL (ref 0.1–1.0)
Monocytes Relative: 9.6 % (ref 3.0–12.0)
Neutro Abs: 3.9 K/uL (ref 1.4–7.7)
Neutrophils Relative %: 74.6 % (ref 43.0–77.0)
Platelets: 221 K/uL (ref 150.0–400.0)
RBC: 3.99 Mil/uL — ABNORMAL LOW (ref 4.22–5.81)
RDW: 14.8 % (ref 11.5–15.5)
WBC: 5.2 K/uL (ref 4.0–10.5)

## 2023-10-04 LAB — HEMOGLOBIN A1C: Hgb A1c MFr Bld: 7.4 % — ABNORMAL HIGH (ref 4.6–6.5)

## 2023-10-04 MED ORDER — OZEMPIC (0.25 OR 0.5 MG/DOSE) 2 MG/3ML ~~LOC~~ SOPN: 0.5000 mg | PEN_INJECTOR | SUBCUTANEOUS | 0 refills | Status: DC

## 2023-10-04 MED ORDER — OZEMPIC (0.25 OR 0.5 MG/DOSE) 2 MG/1.5ML ~~LOC~~ SOPN: 0.5000 mg | PEN_INJECTOR | SUBCUTANEOUS | 0 refills | Status: DC

## 2023-10-04 MED ORDER — EMPAGLIFLOZIN 10 MG PO TABS: 10.0000 mg | ORAL_TABLET | Freq: Every day | ORAL | 1 refills | Status: AC

## 2023-10-04 MED ORDER — LOSARTAN POTASSIUM 50 MG PO TABS: 50.0000 mg | ORAL_TABLET | Freq: Every day | ORAL | 1 refills | Status: DC

## 2023-10-04 MED ORDER — OZEMPIC (1 MG/DOSE) 4 MG/3ML ~~LOC~~ SOPN: 1.0000 mg | PEN_INJECTOR | SUBCUTANEOUS | 0 refills | Status: DC

## 2023-10-04 NOTE — Progress Notes (Signed)
 Subjective:     Patient ID: Rodney [name], male    DOB: May 27, 1940, 83 y.o.   MRN: [id number]  Chief Complaint  Patient presents with   Medical Management of Chronic Issues    3 month follow-up Not fasting     HPI Discussed the use of AI scribe software for clinical note transcription with the patient, who gave verbal consent to proceed.  History of Present Illness Rodney [name] is an 83 year old male with hypertension, diabetes, and atrial fibrillation who presents for follow-up.  HTN-He is on losartan  50 mg daily and amlodipine  5 mg twice daily. He does not check his blood pressure at home and reports no new or worsening symptoms such as headache, dizziness, chest pain, or increased shortness of breath.  For diabetes management, he takes Jardiance  10 mg daily and glipizide  5 mg in the morning. He started Ozempic  with a sample but has been unable to continue due to prescription fulfillment issues, resulting in a lapse of a couple of months. There are no issues with insurance coverage for Ozempic .  HLD-He is on atorvastatin  20 mg daily   DVT-He takes apixaban  and reports no bleeding symptoms such as dark, tarry stools or black stools.  He mentions a recent CT scan and a PET scan due to concerns about the lining of his stomach. He recalls his urologist did not think it was related to his prostate cancer. He is scheduled to discuss these findings with his urologist later this month.  He experiences back pain that limits his activity, requiring rest after 15 minutes of activity. Despite this, he remains active, participating in fishing tournaments with a friend. He has balance issues that affect his ability to fish alone.  No symptoms of depression. He adheres to his diet and tries to stay active. He reports some shortness of breath but notes it is not new or worsening.    Health Maintenance Due  Topic Date Due   DTaP/Tdap/Td (2 - Td or Tdap) 12/02/2020    Past Medical  History:  Diagnosis Date   Bladder calculus    CAD (coronary artery disease)    non obs    CKD (chronic kidney disease), stage III (HCC)     i have 35-40% of my kidney function; mgd by pcp    COLONIC POLYPS, HX OF 12/02/2008   DIABETES MELLITUS, TYPE II 07/19/2007   Dyspnea    reports sob over the last 2 years , underwent cath  in feb 2020 , reports cath was clean , referred to pulmonologist that stopped his Actos  and encouraged weight loss  ; reports today minimum improvement in breathing , appt with pulm in June 2020   Erectile dysfunction    GERD (gastroesophageal reflux disease)    Glaucoma    GOUT 07/19/2007   History of kidney stones    HYPERLIPIDEMIA 12/02/2008   HYPERTENSION 07/19/2007   Kidney stones    Prostate cancer (HCC) 2007   Reiter syndrome    incomplete   TIA (transient ischemic attack)    Tubular adenoma of colon    Vitamin D  deficiency     Past Surgical History:  Procedure Laterality Date   bladder neck dilation     CATARACT EXTRACTION     both eyes- 2015   COLONOSCOPY  11/15/2021   12-11-14-polyps   CYSTOSCOPY N/A 06/11/2018   Procedure: CYSTOSCOPY WITH REMOVAL OF BLADDER CALCULUS;  Surgeon: Renda Glance, MD;  Location: [facility];  Service: Urology;  Laterality:  N/A;   ELBOW SURGERY     bone spur - right   LEFT HEART CATH AND CORONARY ANGIOGRAPHY N/A 03/19/2018   Procedure: LEFT HEART CATH AND CORONARY ANGIOGRAPHY;  Surgeon: Claudene Victory ORN, MD;  Location: [facility];  Service: Cardiovascular;  Laterality: N/A;   LOWER EXTREMITY VENOGRAPHY Left 05/29/2023   Procedure: LOWER EXTREMITY VENOGRAPHY;  Surgeon: Sheree Penne Bruckner, MD;  Location: [facility] INVASIVE CV LAB;  Service: Cardiovascular;  Laterality: Left;   ORIF ANKLE FRACTURE Left 05/17/2015   Procedure: OPEN REDUCTION INTERNAL FIXATION (ORIF) ANKLE FRACTURE;  Surgeon: Glendia Cordella Hutchinson, MD;  Location: [facility];  Service: Orthopedics;  Laterality: Left;   PERIPHERAL VASCULAR THROMBECTOMY Left  05/29/2023   Procedure: PERIPHERAL VASCULAR THROMBECTOMY;  Surgeon: Sheree Penne Bruckner, MD;  Location: [facility] INVASIVE CV LAB;  Service: Cardiovascular;  Laterality: Left;   PROSTATECTOMY  2007   URINARY SPHINCTER IMPLANT N/A 05/21/2019   Procedure: IMPLANTATION OF ARTIFICIAL URINARY SPHINCTER CYSTOSCOPY;  Surgeon: Gaston Glendia, MD;  Location: [facility];  Service: Urology;  Laterality: N/A;   VASECTOMY       Current Outpatient Medications:    amLODipine  (NORVASC ) 5 MG tablet, Take 1 tablet (5 mg total) by mouth 2 (two) times daily., Disp: 180 tablet, Rfl: 1   apixaban  (ELIQUIS ) 5 MG TABS tablet, Take 1 tablet by mouth twice daily, Disp: 180 tablet, Rfl: 1   atorvastatin  (LIPITOR) 20 MG tablet, Take 1 tablet (20 mg total) by mouth at bedtime., Disp: 90 tablet, Rfl: 3   dorzolamide -timolol  (COSOPT ) 22.3-6.8 MG/ML ophthalmic solution, Place 1 drop into both eyes every morning., Disp: , Rfl:    glipiZIDE  (GLUCOTROL ) 5 MG tablet, Take 1 tablet (5 mg total) by mouth daily before breakfast., Disp: 90 tablet, Rfl: 1   glucose blood (ACCU-CHEK AVIVA) test strip, 1 each by Other route 2 (two) times daily., Disp: 100 each, Rfl: 3   latanoprost  (XALATAN ) 0.005 % ophthalmic solution, 1 drop at bedtime., Disp: , Rfl:    polyethylene glycol (MIRALAX  / GLYCOLAX ) 17 g packet, Take 17 g by mouth daily., Disp: 14 each, Rfl: 0   prednisoLONE acetate (PRED FORTE) 1 % ophthalmic suspension, INSTILL 1 DROP 4 TIMES DAILY IN THE AFFECTED EYE FOR 5 DAYS, Disp: , Rfl:    Semaglutide , 1 MG/DOSE, (OZEMPIC , 1 MG/DOSE,) 4 MG/3ML SOPN, Inject 1 mg into the skin once a week., Disp: 3 mL, Rfl: 0   Semaglutide ,0.25 or 0.5MG /DOS, (OZEMPIC , 0.25 OR 0.5 MG/DOSE,) 2 MG/3ML SOPN, Inject 0.5 mg into the skin once a week., Disp: 3 mL, Rfl: 0   senna-docusate (SENOKOT-S) 8.6-50 MG tablet, Take 1 tablet by mouth 2 (two) times daily., Disp: 30 tablet, Rfl: 1   empagliflozin  (JARDIANCE ) 10 MG TABS tablet, Take 1 tablet (10 mg total) by  mouth daily before breakfast., Disp: 90 tablet, Rfl: 1   losartan  (COZAAR ) 50 MG tablet, Take 1 tablet (50 mg total) by mouth at bedtime., Disp: 90 tablet, Rfl: 1  Allergies  Allergen Reactions   Benazepril Shortness Of Breath, Swelling and Other (See Comments)    Tongue became swollen- had to come to the ED   ROS neg/noncontributory except as noted HPI/below      Objective:     BP 121/69   Pulse 73   Temp 98 F (36.7 C) (Temporal)   Resp 16   Ht 5' 8 (1.727 m)   Wt 222 lb 4 oz (100.8 kg)   SpO2 99%   BMI 33.79 kg/m  Wt Readings from Last 3 Encounters:  10/04/23 222 lb 4 oz (100.8 kg)  07/05/23 220 lb (99.8 kg)  07/04/23 221 lb 4 oz (100.4 kg)    Physical Exam   Gen: WDWN NAD HEENT: NCAT, conjunctiva not injected, sclera nonicteric NECK:  supple, no thyromegaly, no nodes, no carotid bruits CARDIAC: RRR, S1S2+, no murmur. DP 2+B LUNGS: CTAB. No wheezes ABDOMEN:  BS+, soft, NTND, No HSM, no masses EXT:  tr edema MSK: no gross abnormalities.  NEURO: A&O x3.  CN II-XII intact.  PSYCH: normal mood. Good eye contact  Diabetic Foot Exam - Simple   Simple Foot Form Diabetic Foot exam was performed with the following findings: Yes 10/04/2023 10:12 AM  Visual Inspection No deformities, no ulcerations, no other skin breakdown bilaterally: Yes Sensation Testing Intact to touch and monofilament testing bilaterally: Yes Pulse Check Posterior Tibialis and Dorsalis pulse intact bilaterally: Yes Comments        Assessment & Plan:  Type 2 diabetes mellitus with stage 3a chronic kidney disease, without long-term current use of insulin  (HCC) -     Empagliflozin ; Take 1 tablet (10 mg total) by mouth daily before breakfast.  Dispense: 90 tablet; Refill: 1 -     Comprehensive metabolic panel with GFR -     Hemoglobin A1c  Essential (primary) hypertension -     Losartan  Potassium; Take 1 tablet (50 mg total) by mouth at bedtime.  Dispense: 90 tablet; Refill: 1 -     CBC with  Differential/Platelet -     Comprehensive metabolic panel with GFR  Dyslipidemia  Malignant neoplasm of prostate (HCC)  History of recurrent deep vein thrombosis (DVT)  Long term current use of oral hypoglycemic drug  Other orders -     Ozempic  (1 MG/DOSE); Inject 1 mg into the skin once a week.  Dispense: 3 mL; Refill: 0 -     Ozempic  (0.25 or 0.5 MG/DOSE); Inject 0.5 mg into the skin once a week.  Dispense: 3 mL; Refill: 0  Assessment and Plan Assessment & Plan Type 2 diabetes mellitus with diabetic chronic kidney disease   He has type 2 diabetes with chronic kidney disease, currently managed with Jardiance , glipizide , and Ozempic . There was an issue with the Ozempic  prescription not being filled after the initial sample, but insurance covers it without prior authorization. Send the Ozempic  1mg  prescription to the pharmacy but start on sample-0.25mg  weekly for 2 wks, then 0.5mg  x 4 wks, then start the 1mg . Order an A1c test to assess glycemic control. Contact the office in one month for Ozempic  dose adjustment.  Essential hypertension  - chronic.  controlled Refill losartan  and amlodipine  prescriptions.  Chronic anticoagulation for blood clots   He is on chronic anticoagulation with apixaban  and reports no signs of bleeding such as dark, tarry stools. Continue apixaban  as prescribed.  Chronic back pain   Chronic back pain limits his activity, requiring rest after 15 minutes. Pain is managed by resting as needed.  Balance impairment   Balance impairment affects activities like fishing. He has adapted by sitting while fishing and going with a friend for safety.  Lower extremity edema   Mild lower extremity edema was observed during the foot exam.  Glaucoma   Glaucoma is managed with eye drops.  Hyperlipidemia   Hyperlipidemia is managed with atorvastatin .  Prostate cancer under urologic surveillance   Prostate cancer is under surveillance by a urologist. A recent CT scan  showed concerns about the stomach lining, unrelated  to prostate cancer. A meeting with urologists is planned to discuss the case further. Follow up with the urologist as scheduled.    Return in about 3 months (around 01/03/2024) for chronic follow-up-same day as wife on 12/11 or change both.  Jenkins CHRISTELLA Carrel, MD

## 2023-10-04 NOTE — Patient Instructions (Signed)

## 2023-10-04 NOTE — Progress Notes (Signed)
 Labs are stable.  A1C better.  Same meds

## 2023-10-10 ENCOUNTER — Other Ambulatory Visit (HOSPITAL_COMMUNITY): Payer: Self-pay | Admitting: Urology

## 2023-10-10 ENCOUNTER — Encounter (HOSPITAL_COMMUNITY): Payer: Self-pay | Admitting: Urology

## 2023-10-10 ENCOUNTER — Encounter: Payer: Self-pay | Admitting: Radiology

## 2023-10-10 DIAGNOSIS — C61 Malignant neoplasm of prostate: Secondary | ICD-10-CM

## 2023-10-10 NOTE — Progress Notes (Signed)
 Rodney Aran, MD  Rodney [name], RT PROCEDURE / BIOPSY REVIEW Date: 10/10/23  Requested Biopsy site: Omentum Reason for request: Possible omental tumor Imaging review: Best seen on CT and PET  Decision: Approved Imaging modality to perform: Ultrasound and CT Schedule with: Moderate Sedation Schedule for: Any VIR  Additional comments:   Please contact me with questions, concerns, or if issue pertaining to this request arise.  [name] ONEIDA Luverne, MD [facility]       Previous Messages    ----- Message ----- From: Rodney [name], RT Sent: 10/10/2023  12:13 PM EDT To: Channing LITTIE Eagles; Ingeborg Fite F Danessa Mensch, RT; Ir Proc* Subject: US  CORE BIOPSY (SOFT TISSUE)                  Procedure : US  CORE BIOPSY (SOFT TISSUE)  Reason :new omental lesions noted in the spring concerning for carcinomatosis. Please biopsy most amenable omental lesion. Dx: Prostate cancer (HCC) [name] (ICD-10-CM)]  History :  NM PET (PSMA) SKULL TO MID THIGH (Accession [id number]) (Order [id number])$MzfnczAzqnmzIZPI_DomJQyoGkvPcXFhwmdCEGdUqDFbbpGFB$$MzfnczAzqnmzIZPI_DomJQyoGkvPcXFhwmdCEGdUqDFbbpGFB$  SCROTUM W/DOPPLER (Accession [id number]) (Order [id number]), CT Angio Chest PE W and/or Wo Contrast (Accession [id number]) (Order [id number]), CT ABDOMEN PELVIS W CONTRAST (Accession [id number]) (Order [id number]), NM PET (PSMA) SKULL TO MID THIGH (Accession [id number]) (Order [id number])  Provider: Gretel Ferrara  Provider contact ; [phone]

## 2023-10-20 DIAGNOSIS — H401131 Primary open-angle glaucoma, bilateral, mild stage: Secondary | ICD-10-CM | POA: Diagnosis not present

## 2023-10-20 DIAGNOSIS — H04123 Dry eye syndrome of bilateral lacrimal glands: Secondary | ICD-10-CM | POA: Diagnosis not present

## 2023-10-20 DIAGNOSIS — H53143 Visual discomfort, bilateral: Secondary | ICD-10-CM | POA: Diagnosis not present

## 2023-10-23 ENCOUNTER — Other Ambulatory Visit (HOSPITAL_COMMUNITY): Payer: Self-pay | Admitting: Student

## 2023-10-23 DIAGNOSIS — C61 Malignant neoplasm of prostate: Secondary | ICD-10-CM

## 2023-10-24 ENCOUNTER — Ambulatory Visit (HOSPITAL_COMMUNITY)
Admission: RE | Admit: 2023-10-24 | Discharge: 2023-10-24 | Disposition: A | Discharge status: Home / Self Care | Source: Ambulatory Visit | Attending: Urology | Admitting: Urology

## 2023-10-24 ENCOUNTER — Other Ambulatory Visit: Payer: Self-pay

## 2023-10-24 ENCOUNTER — Encounter (HOSPITAL_COMMUNITY): Payer: Self-pay

## 2023-10-24 DIAGNOSIS — C61 Malignant neoplasm of prostate: Secondary | ICD-10-CM | POA: Diagnosis present

## 2023-10-24 MED ORDER — SODIUM CHLORIDE 0.9 % IV SOLN: INTRAVENOUS | Status: DC

## 2023-10-24 NOTE — Progress Notes (Signed)
 Patient presented for CT guided omental biopsy with IR. During workup, patient shared that he ate breakfast (protein bar and coffee with cream/sugar) prior to arriving.  Spoke to patient and discussed that he will need to be rescheduled so that he can receive moderate sedation.  Shared need for reschedule with scheduling team; will work to get rescheduled ASAP as biopsy will be needed to guide oncology tx.   Keoni Risinger NP 10/24/2023 8:52 AM

## 2023-10-31 ENCOUNTER — Other Ambulatory Visit: Payer: Self-pay | Admitting: Diagnostic Radiology

## 2023-10-31 DIAGNOSIS — Z01818 Encounter for other preprocedural examination: Secondary | ICD-10-CM

## 2023-11-01 ENCOUNTER — Ambulatory Visit (HOSPITAL_COMMUNITY)
Admission: RE | Admit: 2023-11-01 | Discharge: 2023-11-01 | Disposition: A | Discharge status: Home / Self Care | Source: Ambulatory Visit | Attending: Urology | Admitting: Urology

## 2023-11-01 ENCOUNTER — Other Ambulatory Visit: Payer: Self-pay

## 2023-11-01 DIAGNOSIS — C61 Malignant neoplasm of prostate: Secondary | ICD-10-CM | POA: Insufficient documentation

## 2023-11-01 DIAGNOSIS — Z7901 Long term (current) use of anticoagulants: Secondary | ICD-10-CM | POA: Diagnosis not present

## 2023-11-01 DIAGNOSIS — C786 Secondary malignant neoplasm of retroperitoneum and peritoneum: Secondary | ICD-10-CM | POA: Insufficient documentation

## 2023-11-01 DIAGNOSIS — Z01818 Encounter for other preprocedural examination: Secondary | ICD-10-CM | POA: Diagnosis present

## 2023-11-01 DIAGNOSIS — K668 Other specified disorders of peritoneum: Secondary | ICD-10-CM | POA: Diagnosis present

## 2023-11-01 DIAGNOSIS — Z86718 Personal history of other venous thrombosis and embolism: Secondary | ICD-10-CM | POA: Insufficient documentation

## 2023-11-01 LAB — CBC
HCT: 40.1 % (ref 39.0–52.0)
Hemoglobin: 13.2 g/dL (ref 13.0–17.0)
MCH: 32.4 pg (ref 26.0–34.0)
MCHC: 32.9 g/dL (ref 30.0–36.0)
MCV: 98.3 fL (ref 80.0–100.0)
Platelets: 237 K/uL (ref 150–400)
RBC: 4.08 MIL/uL — ABNORMAL LOW (ref 4.22–5.81)
RDW: 13.8 % (ref 11.5–15.5)
WBC: 5.8 K/uL (ref 4.0–10.5)
nRBC: 0 % (ref 0.0–0.2)

## 2023-11-01 LAB — GLUCOSE, CAPILLARY: Glucose-Capillary: 114 mg/dL — ABNORMAL HIGH (ref 70–99)

## 2023-11-01 LAB — PROTIME-INR
INR: 1 (ref 0.8–1.2)
Prothrombin Time: 14.2 s (ref 11.4–15.2)

## 2023-11-01 MED ORDER — SODIUM CHLORIDE 0.9 % IV SOLN: INTRAVENOUS | Status: DC

## 2023-11-01 MED ORDER — MIDAZOLAM HCL 2 MG/2ML IJ SOLN
INTRAMUSCULAR | Status: AC
  Filled 2023-11-01: qty 2

## 2023-11-01 MED ORDER — LORAZEPAM 2 MG/ML IJ SOLN
INTRAMUSCULAR | Status: AC | PRN
  Administered 2023-11-01: 1 mg via INTRAVENOUS

## 2023-11-01 MED ORDER — LIDOCAINE 1 % OPTIME INJ - NO CHARGE
10.0000 mL | Freq: Once | INTRAMUSCULAR | Status: DC
  Filled 2023-11-01: qty 10

## 2023-11-01 MED ORDER — FENTANYL CITRATE (PF) 100 MCG/2ML IJ SOLN
INTRAMUSCULAR | Status: AC | PRN
  Administered 2023-11-01: 50 ug via INTRAVENOUS

## 2023-11-01 MED ORDER — FENTANYL CITRATE (PF) 100 MCG/2ML IJ SOLN
INTRAMUSCULAR | Status: AC
  Filled 2023-11-01: qty 2

## 2023-11-01 NOTE — H&P (Signed)
 Chief Complaint: Omental lesions - IR consulted for image guided biopsy  Referring Provider(s): Renda Glance, MD   Supervising Physician: Vanice Revel  Patient Status: [facility] - Out-pt  History of Present Illness: Rodney [name] is a 83 y.o. male with pmhx of prostate cancer. Pt was admitted in the hospital back on 05/28/23 for left leg swelling and acute DVT.  CT A/P was done during workup and found soft tissue thickening throughout the omentum, and previous PET scan shows mild hypermetabolic activity at these sites, concerning for omental metastatic disease. IR has now been consulted for image guided omental biopsy.  Pt is without complaint today. Has been NPO since midnight. Has been holding his eliquis  for last 48h.    Patient is Full Code  Past Medical History:  Diagnosis Date   Bladder calculus    CAD (coronary artery disease)    non obs    CKD (chronic kidney disease), stage III (HCC)     i have 35-40% of my kidney function; mgd by pcp    COLONIC POLYPS, HX OF 12/02/2008   DIABETES MELLITUS, TYPE II 07/19/2007   Dyspnea    reports sob over the last 2 years , underwent cath  in feb 2020 , reports cath was clean , referred to pulmonologist that stopped his Actos  and encouraged weight loss  ; reports today minimum improvement in breathing , appt with pulm in June 2020   Erectile dysfunction    GERD (gastroesophageal reflux disease)    Glaucoma    GOUT 07/19/2007   History of kidney stones    HYPERLIPIDEMIA 12/02/2008   HYPERTENSION 07/19/2007   Kidney stones    Prostate cancer (HCC) 2007   Reiter syndrome    incomplete   TIA (transient ischemic attack)    Tubular adenoma of colon    Vitamin D  deficiency     Past Surgical History:  Procedure Laterality Date   bladder neck dilation     CATARACT EXTRACTION     both eyes- 2015   COLONOSCOPY  11/15/2021   12-11-14-polyps   CYSTOSCOPY N/A 06/11/2018   Procedure: CYSTOSCOPY WITH REMOVAL OF BLADDER CALCULUS;   Surgeon: Renda Glance, MD;  Location: [facility];  Service: Urology;  Laterality: N/A;   ELBOW SURGERY     bone spur - right   LEFT HEART CATH AND CORONARY ANGIOGRAPHY N/A 03/19/2018   Procedure: LEFT HEART CATH AND CORONARY ANGIOGRAPHY;  Surgeon: Claudene Victory LELON, MD;  Location: [facility] INVASIVE CV LAB;  Service: Cardiovascular;  Laterality: N/A;   LOWER EXTREMITY VENOGRAPHY Left 05/29/2023   Procedure: LOWER EXTREMITY VENOGRAPHY;  Surgeon: Sheree Penne Bruckner, MD;  Location: [facility] INVASIVE CV LAB;  Service: Cardiovascular;  Laterality: Left;   ORIF ANKLE FRACTURE Left 05/17/2015   Procedure: OPEN REDUCTION INTERNAL FIXATION (ORIF) ANKLE FRACTURE;  Surgeon: Glendia Cordella Hutchinson, MD;  Location: [facility];  Service: Orthopedics;  Laterality: Left;   PERIPHERAL VASCULAR THROMBECTOMY Left 05/29/2023   Procedure: PERIPHERAL VASCULAR THROMBECTOMY;  Surgeon: Sheree Penne Bruckner, MD;  Location: [facility] INVASIVE CV LAB;  Service: Cardiovascular;  Laterality: Left;   PROSTATECTOMY  2007   URINARY SPHINCTER IMPLANT N/A 05/21/2019   Procedure: IMPLANTATION OF ARTIFICIAL URINARY SPHINCTER CYSTOSCOPY;  Surgeon: Gaston Glendia, MD;  Location: [facility];  Service: Urology;  Laterality: N/A;   VASECTOMY      Allergies: Benazepril  Medications: Prior to Admission medications   Medication Sig Start Date End Date Taking? Authorizing Provider  amLODipine  (NORVASC ) 5 MG tablet Take 1  tablet (5 mg total) by mouth 2 (two) times daily. 07/04/23  Yes Wendolyn Jenkins Jansky, MD  atorvastatin  (LIPITOR) 20 MG tablet Take 1 tablet (20 mg total) by mouth at bedtime. 07/04/23  Yes Wendolyn Jenkins Jansky, MD  dorzolamide -timolol  (COSOPT ) 22.3-6.8 MG/ML ophthalmic solution Place 1 drop into both eyes every morning. 09/20/13  Yes [provider]  empagliflozin  (JARDIANCE ) 10 MG TABS tablet Take 1 tablet (10 mg total) by mouth daily before breakfast. 10/04/23  Yes Wendolyn Jenkins Jansky, MD  glipiZIDE  (GLUCOTROL ) 5 MG tablet Take 1 tablet (5 mg  total) by mouth daily before breakfast. 07/04/23  Yes Wendolyn Jenkins Jansky, MD  latanoprost  (XALATAN ) 0.005 % ophthalmic solution 1 drop at bedtime. 09/19/23  Yes [provider]  losartan  (COZAAR ) 50 MG tablet Take 1 tablet (50 mg total) by mouth at bedtime. 10/04/23  Yes Wendolyn Jenkins Jansky, MD  Semaglutide ,0.25 or 0.5MG /DOS, (OZEMPIC , 0.25 OR 0.5 MG/DOSE,) 2 MG/3ML SOPN Inject 0.5 mg into the skin once a week. 10/04/23  Yes Wendolyn Jenkins Jansky, MD  apixaban  (ELIQUIS ) 5 MG TABS tablet Take 1 tablet by mouth twice daily 07/28/23   Wendolyn Jenkins Jansky, MD  glucose blood (ACCU-CHEK AVIVA) test strip 1 each by Other route 2 (two) times daily. 06/24/11   Jame Maude FALCON, MD  polyethylene glycol (MIRALAX  / GLYCOLAX ) 17 g packet Take 17 g by mouth daily. 05/31/23   Regalado, Belkys A, MD  prednisoLONE acetate (PRED FORTE) 1 % ophthalmic suspension INSTILL 1 DROP 4 TIMES DAILY IN THE AFFECTED EYE FOR 5 DAYS 09/12/23   [provider]  Semaglutide , 1 MG/DOSE, (OZEMPIC , 1 MG/DOSE,) 4 MG/3ML SOPN Inject 1 mg into the skin once a week. 10/04/23   Wendolyn Jenkins Jansky, MD  senna-docusate (SENOKOT-S) 8.6-50 MG tablet Take 1 tablet by mouth 2 (two) times daily. 05/30/23   Regalado, Owen LABOR, MD     Family History  Problem Relation Age of Onset   Cancer Sister        metastatic colon ca   Colon cancer Neg Hx    Esophageal cancer Neg Hx    Rectal cancer Neg Hx    Stomach cancer Neg Hx    Lung disease Neg Hx    Colon polyps Neg Hx    Pancreatic cancer Neg Hx     Social History   Socioeconomic History   Marital status: Married    Spouse name: Not on file   Number of children: 2   Years of education: Not on file   Highest education level: Not on file  Occupational History   Not on file  Tobacco Use   Smoking status: Former    Current packs/day: 0.00    Average packs/day: 1 pack/day for 30.0 years (30.0 ttl pk-yrs)    Types: Cigarettes    Start date: 01/25/1948    Quit date: 01/24/1978    Years since  quitting: 45.8   Smokeless tobacco: Never  Vaping Use   Vaping status: Never Used  Substance and Sexual Activity   Alcohol  use: Not Currently    Comment: occasionally -socially   Drug use: No   Sexual activity: Not Currently    Comment: 2 childern  Other Topics Concern   Not on file  Social History Narrative   6 grands 2 greats      Was competitive fisherman.   Former Holiday representative   Social Drivers of Corporate investment banker Strain: Low Risk  (04/03/2023)   Overall Programmer, applications (  CARDIA)    Difficulty of Paying Living Expenses: Not hard at all  Food Insecurity: No Food Insecurity (05/31/2023)   Hunger Vital Sign    Worried About Running Out of Food in the Last Year: Never true    Ran Out of Food in the Last Year: Never true  Transportation Needs: No Transportation Needs (05/31/2023)   PRAPARE - Administrator, Civil Service (Medical): No    Lack of Transportation (Non-Medical): No  Physical Activity: Inactive (04/03/2023)   Exercise Vital Sign    Days of Exercise per Week: 0 days    Minutes of Exercise per Session: 0 min  Stress: No Stress Concern Present (04/03/2023)   [employer] of Occupational Health - Occupational Stress Questionnaire    Feeling of Stress : Not at all  Social Connections: Socially Integrated (05/28/2023)   Social Connection and Isolation Panel    Frequency of Communication with Friends and Family: Three times a week    Frequency of Social Gatherings with Friends and Family: Twice a week    Attends Religious Services: More than 4 times per year    Active Member of [employer] or Organizations: Yes    Attends Engineer, structural: More than 4 times per year    Marital Status: Married     Review of Systems: A 12 point ROS discussed and pertinent positives are indicated in the HPI above.  All other systems are negative.   Vital Signs: BP 130/78   Pulse 81   Temp 97.8 F (36.6 C) (Oral)   Resp 18   Ht 5' 8 (1.727 m)    Wt 220 lb (99.8 kg)   SpO2 99%   BMI 33.45 kg/m   Advance Care Plan: No documents on file  Physical Exam Vitals and nursing note reviewed.  Constitutional:      General: He is not in acute distress. HENT:     Mouth/Throat:     Mouth: Mucous membranes are moist.     Pharynx: Oropharynx is clear.  Cardiovascular:     Rate and Rhythm: Normal rate and regular rhythm.  Pulmonary:     Effort: Pulmonary effort is normal.     Breath sounds: Normal breath sounds.  Abdominal:     Palpations: Abdomen is soft.     Comments: Soft, distended  Musculoskeletal:     Right lower leg: No edema.     Left lower leg: No edema.  Skin:    General: Skin is warm and dry.  Neurological:     Mental Status: He is alert and oriented to person, place, and time. Mental status is at baseline.     Imaging: No results found.  Labs:  CBC: Recent Labs    05/29/23 0547 05/30/23 0425 10/04/23 1022 11/01/23 0809  WBC 6.9 6.1 5.2 5.8  HGB 11.7* 10.9* 12.8* 13.2  HCT 34.9* 32.6* 38.8* 40.1  PLT 136* 131* 221.0 237    COAGS: Recent Labs    11/01/23 0809  INR 1.0    BMP: Recent Labs    05/28/23 1642 05/29/23 0547 05/30/23 0425 10/04/23 1022  NA 136 136 137 138  K 4.5 4.1 4.0 4.7  CL 108 110 108 107  CO2 20* 20* 19* 25  GLUCOSE 289* 175* 164* 165*  BUN 24* 20 22 23   CALCIUM  8.9 8.5* 8.5* 9.6  CREATININE 1.83* 1.59* 1.61* 1.72*  GFRNONAA 36* 43* 42*  --     LIVER FUNCTION TESTS: Recent Labs  12/30/22 1100 05/28/23 1642 05/29/23 0547 10/04/23 1022  BILITOT 0.7 1.5* 1.3* 1.0  AST 15 24 11* 16  ALT 21 16 17 19   ALKPHOS 97 85 77 80  PROT 7.2 6.7 6.2* 7.0  ALBUMIN 4.4 3.8 3.4* 4.3    TUMOR MARKERS: No results for input(s): AFPTM, CEA, CA199, CHROMGRNA in the last 8760 hours.  Assessment and Plan:  Rodney [name] is a 83 y.o. male with pmhx of prostate cancer. Pt was admitted in the hospital back on 05/28/23 for left leg swelling and acute DVT.  CT A/P was  done during workup and found soft tissue thickening throughout the omentum, and previous PET scan shows mild hypermetabolic activity at these sites, concerning for omental metastatic disease. IR has now been consulted for image guided omental biopsy.  Pt is without complaint today. Has been NPO since midnight. Has been holding his eliquis  for last 48h.   Risks and benefits of omental biopsy was discussed with the patient and/or patient's family including, but not limited to bleeding, infection, damage to adjacent structures or low yield requiring additional tests.  All of the questions were answered and there is agreement to proceed.  Consent signed and in chart.   Thank you for allowing our service to participate in Rodney [name] 's care.  Electronically Signed: Kimble VEAR Clas, PA-C   11/01/2023, 9:00 AM      I spent a total of  30 Minutes   in face to face in clinical consultation, greater than 50% of which was counseling/coordinating care for omental biopsy

## 2023-11-01 NOTE — Procedures (Signed)
 Interventional Radiology Procedure Note  Procedure: CT CORE BX RT OMENTUM    Complications: None  Estimated Blood Loss:  MIN  Findings: 23 G CORES IN FORMALIN    M. TREVOR Haadi Santellan, MD

## 2023-11-03 LAB — SURGICAL PATHOLOGY

## 2023-11-21 ENCOUNTER — Other Ambulatory Visit: Payer: Self-pay | Admitting: Family Medicine

## 2023-12-11 ENCOUNTER — Other Ambulatory Visit: Payer: Self-pay

## 2023-12-11 ENCOUNTER — Other Ambulatory Visit: Payer: Self-pay | Admitting: Family Medicine

## 2023-12-11 DIAGNOSIS — I10 Essential (primary) hypertension: Secondary | ICD-10-CM

## 2023-12-11 MED ORDER — LOSARTAN POTASSIUM 50 MG PO TABS
50.0000 mg | ORAL_TABLET | Freq: Every day | ORAL | Status: AC
Start: 2023-12-11 — End: ?

## 2023-12-13 ENCOUNTER — Ambulatory Visit (HOSPITAL_COMMUNITY)
Admission: RE | Admit: 2023-12-13 | Discharge: 2023-12-13 | Disposition: A | Discharge status: Home / Self Care | Source: Ambulatory Visit | Attending: Surgery | Admitting: Surgery

## 2023-12-13 ENCOUNTER — Ambulatory Visit (INDEPENDENT_AMBULATORY_CARE_PROVIDER_SITE_OTHER): Admitting: Physician Assistant

## 2023-12-13 VITALS — BP 122/72 | HR 83 | Temp 97.7°F | Wt 218.5 lb

## 2023-12-13 DIAGNOSIS — I82462 Acute embolism and thrombosis of left calf muscular vein: Secondary | ICD-10-CM | POA: Diagnosis present

## 2023-12-13 DIAGNOSIS — I871 Compression of vein: Secondary | ICD-10-CM | POA: Insufficient documentation

## 2023-12-13 NOTE — Progress Notes (Signed)
 Office Note     CC:  follow up Requesting Provider:  Wendolyn Jenkins Jansky, MD  HPI: Rodney [name] is a 83 y.o. (Apr 23, 1940) male who presents for surveillance of left common iliac vein stenting.  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 Sheree in May of this year.  He denies any significant edema of either lower extremity.  He no longer wears stockings regularly because he no longer needs them.  He is on Eliquis  daily.  He has history of prostate cancer with history of resection and radiation in the past.  He is now on hormone therapy.  He states he has nodules on his abdominal wall and is meeting with his urologist later this month to discuss treatment plan.   Past Medical History:  Diagnosis Date   Bladder calculus    CAD (coronary artery disease)    non obs    CKD (chronic kidney disease), stage III (HCC)     i have 35-40% of my kidney function; mgd by pcp    COLONIC POLYPS, HX OF 12/02/2008   DIABETES MELLITUS, TYPE II 07/19/2007   Dyspnea    reports sob over the last 2 years , underwent cath  in feb 2020 , reports cath was clean , referred to pulmonologist that stopped his Actos  and encouraged weight loss  ; reports today minimum improvement in breathing , appt with pulm in June 2020   Erectile dysfunction    GERD (gastroesophageal reflux disease)    Glaucoma    GOUT 07/19/2007   History of kidney stones    HYPERLIPIDEMIA 12/02/2008   HYPERTENSION 07/19/2007   Kidney stones    Prostate cancer (HCC) 2007   Reiter syndrome    incomplete   TIA (transient ischemic attack)    Tubular adenoma of colon    Vitamin D  deficiency     Past Surgical History:  Procedure Laterality Date   bladder neck dilation     CATARACT EXTRACTION     both eyes- 2015   COLONOSCOPY  11/15/2021   12-11-14-polyps   CYSTOSCOPY N/A 06/11/2018   Procedure: CYSTOSCOPY WITH REMOVAL OF BLADDER CALCULUS;  Surgeon:  Renda Glance, MD;  Location: [facility];  Service: Urology;  Laterality: N/A;   ELBOW SURGERY     bone spur - right   LEFT HEART CATH AND CORONARY ANGIOGRAPHY N/A 03/19/2018   Procedure: LEFT HEART CATH AND CORONARY ANGIOGRAPHY;  Surgeon: Claudene Victory LELON, MD;  Location: [facility] INVASIVE CV LAB;  Service: Cardiovascular;  Laterality: N/A;   LOWER EXTREMITY VENOGRAPHY Left 05/29/2023   Procedure: LOWER EXTREMITY VENOGRAPHY;  Surgeon: Sheree Penne Bruckner, MD;  Location: [facility] INVASIVE CV LAB;  Service: Cardiovascular;  Laterality: Left;   ORIF ANKLE FRACTURE Left 05/17/2015   Procedure: OPEN REDUCTION INTERNAL FIXATION (ORIF) ANKLE FRACTURE;  Surgeon: Glendia Cordella Hutchinson, MD;  Location: [facility];  Service: Orthopedics;  Laterality: Left;   PERIPHERAL VASCULAR THROMBECTOMY Left 05/29/2023   Procedure: PERIPHERAL VASCULAR THROMBECTOMY;  Surgeon: Sheree Penne Bruckner, MD;  Location: [facility] INVASIVE CV LAB;  Service: Cardiovascular;  Laterality: Left;   PROSTATECTOMY  2007   URINARY SPHINCTER IMPLANT N/A 05/21/2019   Procedure: IMPLANTATION OF ARTIFICIAL URINARY SPHINCTER CYSTOSCOPY;  Surgeon: Gaston Glendia, MD;  Location: [facility];  Service: Urology;  Laterality: N/A;   VASECTOMY      Social History   Socioeconomic History   Marital status: Married    Spouse name: Not  on file   Number of children: 2   Years of education: Not on file   Highest education level: Not on file  Occupational History   Not on file  Tobacco Use   Smoking status: Former    Current packs/day: 0.00    Average packs/day: 1 pack/day for 30.0 years (30.0 ttl pk-yrs)    Types: Cigarettes    Start date: 01/25/1948    Quit date: 01/24/1978    Years since quitting: 45.9   Smokeless tobacco: Never  Vaping Use   Vaping status: Never Used  Substance and Sexual Activity   Alcohol  use: Not Currently    Comment: occasionally -socially   Drug use: No   Sexual activity: Not Currently    Comment: 2 childern  Other Topics Concern   Not  on file  Social History Narrative   6 grands 2 greats      Was competitive fisherman.   Former holiday representative   Social Drivers of Corporate Investment Banker Strain: Low Risk  (04/03/2023)   Overall Financial Resource Strain (CARDIA)    Difficulty of Paying Living Expenses: Not hard at all  Food Insecurity: No Food Insecurity (05/31/2023)   Hunger Vital Sign    Worried About Running Out of Food in the Last Year: Never true    Ran Out of Food in the Last Year: Never true  Transportation Needs: No Transportation Needs (05/31/2023)   PRAPARE - Administrator, Civil Service (Medical): No    Lack of Transportation (Non-Medical): No  Physical Activity: Inactive (04/03/2023)   Exercise Vital Sign    Days of Exercise per Week: 0 days    Minutes of Exercise per Session: 0 min  Stress: No Stress Concern Present (04/03/2023)   [employer] of Occupational Health - Occupational Stress Questionnaire    Feeling of Stress : Not at all  Social Connections: Socially Integrated (05/28/2023)   Social Connection and Isolation Panel    Frequency of Communication with Friends and Family: Three times a week    Frequency of Social Gatherings with Friends and Family: Twice a week    Attends Religious Services: More than 4 times per year    Active Member of [employer] or Organizations: Yes    Attends Engineer, Structural: More than 4 times per year    Marital Status: Married  Catering Manager Violence: Not At Risk (05/31/2023)   Humiliation, Afraid, Rape, and Kick questionnaire    Fear of Current or Ex-Partner: No    Emotionally Abused: No    Physically Abused: No    Sexually Abused: No    Family History  Problem Relation Age of Onset   Cancer Sister        metastatic colon ca   Colon cancer Neg Hx    Esophageal cancer Neg Hx    Rectal cancer Neg Hx    Stomach cancer Neg Hx    Lung disease Neg Hx    Colon polyps Neg Hx    Pancreatic cancer Neg Hx     Current Outpatient Medications   Medication Sig Dispense Refill   amLODipine  (NORVASC ) 5 MG tablet Take 1 tablet (5 mg total) by mouth 2 (two) times daily. 180 tablet 1   apixaban  (ELIQUIS ) 5 MG TABS tablet Take 1 tablet by mouth twice daily 180 tablet 1   atorvastatin  (LIPITOR) 20 MG tablet Take 1 tablet (20 mg total) by mouth at bedtime. 90 tablet 3   dorzolamide -timolol  (COSOPT ) 22.3-6.8  MG/ML ophthalmic solution Place 1 drop into both eyes every morning.     empagliflozin  (JARDIANCE ) 10 MG TABS tablet Take 1 tablet (10 mg total) by mouth daily before breakfast. 90 tablet 1   glipiZIDE  (GLUCOTROL ) 5 MG tablet Take 1 tablet (5 mg total) by mouth daily before breakfast. 90 tablet 1   glucose blood (ACCU-CHEK AVIVA) test strip 1 each by Other route 2 (two) times daily. 100 each 3   latanoprost  (XALATAN ) 0.005 % ophthalmic solution 1 drop at bedtime.     losartan  (COZAAR ) 50 MG tablet Take 1 tablet (50 mg total) by mouth at bedtime.     OZEMPIC , 1 MG/DOSE, 4 MG/3ML SOPN INJECT 1MG  INTO THE SKIN ONCE A WEEK 3 mL 0   polyethylene glycol (MIRALAX  / GLYCOLAX ) 17 g packet Take 17 g by mouth daily. 14 each 0   prednisoLONE acetate (PRED FORTE) 1 % ophthalmic suspension INSTILL 1 DROP 4 TIMES DAILY IN THE AFFECTED EYE FOR 5 DAYS     Semaglutide ,0.25 or 0.5MG /DOS, (OZEMPIC , 0.25 OR 0.5 MG/DOSE,) 2 MG/3ML SOPN Inject 0.5 mg into the skin once a week. 3 mL 0   senna-docusate (SENOKOT-S) 8.6-50 MG tablet Take 1 tablet by mouth 2 (two) times daily. 30 tablet 1   No current facility-administered medications for this visit.    Allergies  Allergen Reactions   Benazepril Shortness Of Breath, Swelling and Other (See Comments)    Tongue became swollen- had to come to the ED     REVIEW OF SYSTEMS:  Negative unless noted in HPI [X]  denotes positive finding, [ ]  denotes negative finding Cardiac  Comments:  Chest pain or chest pressure:    Shortness of breath upon exertion:    Short of breath when lying flat:    Irregular heart rhythm:         Vascular    Pain in calf, thigh, or hip brought on by ambulation:    Pain in feet at night that wakes you up from your sleep:     Blood clot in your veins:    Leg swelling:         Pulmonary    Oxygen at home:    Productive cough:     Wheezing:         Neurologic    Sudden weakness in arms or legs:     Sudden numbness in arms or legs:     Sudden onset of difficulty speaking or slurred speech:    Temporary loss of vision in one eye:     Problems with dizziness:         Gastrointestinal    Blood in stool:     Vomited blood:         Genitourinary    Burning when urinating:     Blood in urine:        Psychiatric    Major depression:         Hematologic    Bleeding problems:    Problems with blood clotting too easily:        Skin    Rashes or ulcers:        Constitutional    Fever or chills:      PHYSICAL EXAMINATION:  Vitals:   12/13/23 1034  BP: 122/72  Pulse: 83  Temp: 97.7 F (36.5 C)  TempSrc: Temporal  Weight: 218 lb 8 oz (99.1 kg)    General:  WDWN in NAD; vital signs documented above Gait: Not observed  HENT: WNL, normocephalic Pulmonary: normal non-labored breathing Cardiac: regular HR Abdomen: soft, NT, no masses Skin: without rashes Vascular Exam/Pulses: Feet are warm with good cap refill Extremities: without ischemic changes, without Gangrene , without cellulitis; without open wounds;  Musculoskeletal: no muscle wasting or atrophy  Neurologic: A&O X 3 Psychiatric:  The pt has Normal affect.   Non-Invasive Vascular Imaging:   Widely patent IVC, right iliac venous system, left iliac venous stent    ASSESSMENT/PLAN:: 83 y.o. 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  He denies any change in edema or pain of either lower extremity.  Duplex demonstrates a widely  patent IVC, right iliac venous system, and left iliac venous stent.  He unfortunately has started on hormone therapy due to recurrent prostate cancer with metastases.  He will continue Eliquis  for now until treatment plan for cancer has been decided on.  We will repeat IVC and iliac venous duplex in 1 year.   Donnice Sender, PA-C [facility] [phone]  Clinic MD:   Gretta on call

## 2023-12-14 ENCOUNTER — Telehealth: Payer: Self-pay

## 2023-12-14 NOTE — Telephone Encounter (Signed)
 I called pt to introduce myself as  the Coordinator of the [facility].   1. I confirmed with the patient he is aware of his referral to the clinic 11/25, arriving @ 12:30 pm.    2. I discussed the format of the clinic and the physicians he will be seeing that day.   3. I discussed where the clinic is located and how to contact me.   4. I confirmed his address and informed him I would be mailing a packet of information and forms to be completed. I asked him to bring them with him the day of his appointment.    He voiced understanding of the above. I asked him to call me if he has any questions or concerns regarding his appointments or the forms he needs to complete.

## 2023-12-18 NOTE — Progress Notes (Unsigned)
                               Care Plan Summary  Name: Rodney [name] DOB: 03-19-1940   Your Medical Team:   Urologist -  Dr. Gretel Ferrara, [facility]  Radiation Oncologist - Dr. Donnice Barge, [facility]   Medical Oncologist - Dr. Pauletta Chihuahua, [facility]  Recommendations: 1) ADT (Started 10/23)  2) [name]    * These recommendations are based on information available as of today's consult.      Recommendations may change depending on the results of further tests or exams.    Next Steps: 1) Dr. Ferrara will prescribe Nubeqa.   When appointments need to be scheduled, you will be contacted by [facility] and/or [facility].  Questions?  Please do not hesitate to call Vertell Pont, BSN, RN at [phone] with any questions or concerns.  Vertell is your Oncology Nurse Navigator and is available to assist you while you're receiving your medical care at [facility].

## 2023-12-18 NOTE — Progress Notes (Signed)
RN spoke with patient and confirmed upcoming Omer appointment.

## 2023-12-18 NOTE — Progress Notes (Unsigned)
 [facility] CONSULT NOTE  Patient Care Team: Rodney Jenkins Jansky, MD as PCP - General (Family Medicine) Rodney [name], OD (Optometry)  ASSESSMENT & PLAN:  Rodney [name] is a 83 y.o.male with history of rustic cancer, CKD 3, hypertension, hyperlipidemia, CAD, type 2 diabetes being seen at [facility] for prostate cancer.  Patient presented with prostate cancer in 2009 underwent RP, developed BCR and underwent salvage radiation in 2015.  He developed BCR and oligometastasis to L3, and left external iliac lymph nodes and underwent SBRT in 10/2021.  He is now presenting with omental metastases.  PSA was 32.5 in July 2025.  Current diagnosis: mHSPC with peritoneal carcinomatosis Initial diagnosis: pT2c N0Mx in 2007 with GS 3+4=7.  Radical prostatectomy and margins negative.  Pretreatment PSA 6.32. Germline testing: Somatic testing: Treatment:  His case was discussed at tumor board with multiple specialists including radiation oncologist, urology oncologist, pathologist, radiologist. The patient was counseled on the natural history of prostate cancer and the standard treatment options that are available for prostate cancer.   For low risk favorable intermediate risk, per NCCN guideline, in patients with > 10 years of life expectancy, active surveillance, RP or RT are all options resulted in excellent long term survival. For unfavorable intermediate risk, per NCCN guideline, in patients with > 10 years of life expectancy, RP + PLND or RT + ADT are both options resulted in excellent long term survival. For high risk, per NCCN guideline, in patients with > 5 years of life expectancy, RP + PLND or RT + long term ADT are both options resulted in excellent long term survival.  Treatment is recommended. Patient was encouraged to consider the side effect profiles of each treatment modality and make an informed decision for definitive treatment.  Patient will follow-up with radiation oncology or  urologic oncology for definitive treatment.  He may follow-up with medical oncology as needed.  Assessment & Plan   No orders of the defined types were placed in this encounter.   Supportive baseline bone mineral density study and then every 2 years calcium  (1000-1200 mg daily from food and supplements) and vitamin D3 (1000 IU daily) Zometa (5 mg IV annually) for osteopenia (T-score between -1.0 and -2.5) on ADT after dental clearance. If CRPC, 4 mg every 3 months if having bone metastases. Healthy lifestyle to prevent diabetes and CV disease Weight-bearing exercises (30 minutes per day) Limit alcohol  consumption and avoid smoking  All questions were answered. The patient knows to call the clinic with any problems, questions or concerns. No barriers to learning was detected.  Rodney JAYSON Chihuahua, MD 11/24/20255:13 PM  CHIEF COMPLAINTS/PURPOSE OF CONSULTATION:  prostate cancer  HISTORY OF PRESENTING ILLNESS:  Rodney [name] 83 y.o. male is here because of prostate cancer.  I have reviewed his chart and materials related to his cancer extensively and collaborated history with the patient. Summary of oncologic history is as follows:  Report patient had radical prostatectomy in 2009. He developed BCR.  He underwent salvage radiation from November to December 2015.  PSA was 0.51 prior to radiation.  In May 2019, CT scan and bone scan were negative and PSA was 5.31. In July 2022, bone scan and CT scan was negative for metastases.  PSA was 17.4. In October 2023, PSMA PET was done a PSA of 26.9.  Report L3 metastatic lesion and external iliac lymph nodes. December 2023 patient underwent SBRT to L3 and 10 grade to the left external iliac lymph node/periaortic lymph node September  2024 PSA was 34.6.  PSMA PET was negative. December 2024 PSA 33.28 April 2023 PSA 33.26 July 2023 PSA 32.5.  Patient underwent repeat PSMA PET/CT and showed lower abdominal retroperitoneal and along the common  iliac vessel with activity, cannot exclude small volume residual nodal metastases.  Relatively similar omental/peritoneal soft tissue thickening.  11/01/2023 patient underwent omental biopsy and showed prostate adenocarcinoma.   He denies any urinary symptoms such as dysuria, increased frequency, hesitancy, hematuria or difficulty urinating.  Oncology History   No history exists.    MEDICAL HISTORY:  Past Medical History:  Diagnosis Date   Bladder calculus    CAD (coronary artery disease)    non obs    CKD (chronic kidney disease), stage III (HCC)     i have 35-40% of my kidney function; mgd by pcp    COLONIC POLYPS, HX OF 12/02/2008   DIABETES MELLITUS, TYPE II 07/19/2007   Dyspnea    reports sob over the last 2 years , underwent cath  in feb 2020 , reports cath was clean , referred to pulmonologist that stopped his Actos  and encouraged weight loss  ; reports today minimum improvement in breathing , appt with pulm in June 2020   Erectile dysfunction    GERD (gastroesophageal reflux disease)    Glaucoma    GOUT 07/19/2007   History of kidney stones    HYPERLIPIDEMIA 12/02/2008   HYPERTENSION 07/19/2007   Kidney stones    Prostate cancer (HCC) 2007   Reiter syndrome    incomplete   TIA (transient ischemic attack)    Tubular adenoma of colon    Vitamin D  deficiency     SURGICAL HISTORY: Past Surgical History:  Procedure Laterality Date   bladder neck dilation     CATARACT EXTRACTION     both eyes- 2015   COLONOSCOPY  11/15/2021   12-11-14-polyps   CYSTOSCOPY N/A 06/11/2018   Procedure: CYSTOSCOPY WITH REMOVAL OF BLADDER CALCULUS;  Surgeon: Renda Glance, MD;  Location: [facility];  Service: Urology;  Laterality: N/A;   ELBOW SURGERY     bone spur - right   LEFT HEART CATH AND CORONARY ANGIOGRAPHY N/A 03/19/2018   Procedure: LEFT HEART CATH AND CORONARY ANGIOGRAPHY;  Surgeon: Claudene Victory ORN, MD;  Location: [facility] INVASIVE CV LAB;  Service: Cardiovascular;  Laterality: N/A;    LOWER EXTREMITY VENOGRAPHY Left 05/29/2023   Procedure: LOWER EXTREMITY VENOGRAPHY;  Surgeon: Sheree Penne Bruckner, MD;  Location: [facility] INVASIVE CV LAB;  Service: Cardiovascular;  Laterality: Left;   ORIF ANKLE FRACTURE Left 05/17/2015   Procedure: OPEN REDUCTION INTERNAL FIXATION (ORIF) ANKLE FRACTURE;  Surgeon: Glendia Cordella Hutchinson, MD;  Location: [facility];  Service: Orthopedics;  Laterality: Left;   PERIPHERAL VASCULAR THROMBECTOMY Left 05/29/2023   Procedure: PERIPHERAL VASCULAR THROMBECTOMY;  Surgeon: Sheree Penne Bruckner, MD;  Location: [facility] INVASIVE CV LAB;  Service: Cardiovascular;  Laterality: Left;   PROSTATECTOMY  2007   URINARY SPHINCTER IMPLANT N/A 05/21/2019   Procedure: IMPLANTATION OF ARTIFICIAL URINARY SPHINCTER CYSTOSCOPY;  Surgeon: Gaston Glendia, MD;  Location: [facility];  Service: Urology;  Laterality: N/A;   VASECTOMY      SOCIAL HISTORY: Social History   Socioeconomic History   Marital status: Married    Spouse name: Not on file   Number of children: 2   Years of education: Not on file   Highest education level: Not on file  Occupational History   Not on file  Tobacco Use   Smoking status: Former  Current packs/day: 0.00    Average packs/day: 1 pack/day for 30.0 years (30.0 ttl pk-yrs)    Types: Cigarettes    Start date: 01/25/1948    Quit date: 01/24/1978    Years since quitting: 45.9   Smokeless tobacco: Never  Vaping Use   Vaping status: Never Used  Substance and Sexual Activity   Alcohol  use: Not Currently    Comment: occasionally -socially   Drug use: No   Sexual activity: Not Currently    Comment: 2 childern  Other Topics Concern   Not on file  Social History Narrative   6 grands 2 greats      Was competitive fisherman.   Former holiday representative   Social Drivers of Corporate Investment Banker Strain: Low Risk  (04/03/2023)   Overall Financial Resource Strain (CARDIA)    Difficulty of Paying Living Expenses: Not hard at all  Food Insecurity:  No Food Insecurity (05/31/2023)   Hunger Vital Sign    Worried About Running Out of Food in the Last Year: Never true    Ran Out of Food in the Last Year: Never true  Transportation Needs: No Transportation Needs (05/31/2023)   PRAPARE - Administrator, Civil Service (Medical): No    Lack of Transportation (Non-Medical): No  Physical Activity: Inactive (04/03/2023)   Exercise Vital Sign    Days of Exercise per Week: 0 days    Minutes of Exercise per Session: 0 min  Stress: No Stress Concern Present (04/03/2023)   [employer] of Occupational Health - Occupational Stress Questionnaire    Feeling of Stress : Not at all  Social Connections: Socially Integrated (05/28/2023)   Social Connection and Isolation Panel    Frequency of Communication with Friends and Family: Three times a week    Frequency of Social Gatherings with Friends and Family: Twice a week    Attends Religious Services: More than 4 times per year    Active Member of [employer] or Organizations: Yes    Attends Engineer, Structural: More than 4 times per year    Marital Status: Married  Catering Manager Violence: Not At Risk (05/31/2023)   Humiliation, Afraid, Rape, and Kick questionnaire    Fear of Current or Ex-Partner: No    Emotionally Abused: No    Physically Abused: No    Sexually Abused: No    FAMILY HISTORY: Family History  Problem Relation Age of Onset   Cancer Sister        metastatic colon ca   Colon cancer Neg Hx    Esophageal cancer Neg Hx    Rectal cancer Neg Hx    Stomach cancer Neg Hx    Lung disease Neg Hx    Colon polyps Neg Hx    Pancreatic cancer Neg Hx     ALLERGIES:  is allergic to benazepril.  MEDICATIONS:  Current Outpatient Medications  Medication Sig Dispense Refill   amLODipine  (NORVASC ) 5 MG tablet Take 1 tablet (5 mg total) by mouth 2 (two) times daily. 180 tablet 1   apixaban  (ELIQUIS ) 5 MG TABS tablet Take 1 tablet by mouth twice daily 180 tablet 1   atorvastatin   (LIPITOR) 20 MG tablet Take 1 tablet (20 mg total) by mouth at bedtime. 90 tablet 3   dorzolamide -timolol  (COSOPT ) 22.3-6.8 MG/ML ophthalmic solution Place 1 drop into both eyes every morning.     empagliflozin  (JARDIANCE ) 10 MG TABS tablet Take 1 tablet (10 mg total) by mouth daily before breakfast.  90 tablet 1   glipiZIDE  (GLUCOTROL ) 5 MG tablet Take 1 tablet (5 mg total) by mouth daily before breakfast. 90 tablet 1   glucose blood (ACCU-CHEK AVIVA) test strip 1 each by Other route 2 (two) times daily. 100 each 3   latanoprost  (XALATAN ) 0.005 % ophthalmic solution 1 drop at bedtime.     losartan  (COZAAR ) 50 MG tablet Take 1 tablet (50 mg total) by mouth at bedtime.     OZEMPIC , 1 MG/DOSE, 4 MG/3ML SOPN INJECT 1MG  INTO THE SKIN ONCE A WEEK 3 mL 0   polyethylene glycol (MIRALAX  / GLYCOLAX ) 17 g packet Take 17 g by mouth daily. 14 each 0   prednisoLONE acetate (PRED FORTE) 1 % ophthalmic suspension INSTILL 1 DROP 4 TIMES DAILY IN THE AFFECTED EYE FOR 5 DAYS     Semaglutide ,0.25 or 0.5MG /DOS, (OZEMPIC , 0.25 OR 0.5 MG/DOSE,) 2 MG/3ML SOPN Inject 0.5 mg into the skin once a week. 3 mL 0   senna-docusate (SENOKOT-S) 8.6-50 MG tablet Take 1 tablet by mouth 2 (two) times daily. 30 tablet 1   No current facility-administered medications for this visit.    REVIEW OF SYSTEMS:   All relevant systems were reviewed with the patient and are negative.  PHYSICAL EXAMINATION: ECOG PERFORMANCE STATUS: {CHL ONC ECOG [id number]}  There were no vitals filed for this visit. There were no vitals filed for this visit.  GENERAL: alert, no distress and comfortable SKIN: skin color is normal, no jaundice LUNGS: Effort normal, no respiratory distress.  Clear to auscultation bilaterally HEART: regular rate & rhythm and no lower extremity edema ABDOMEN: soft, non-tender and nondistended Musculoskeletal: no point tenderness  LABORATORY & PATHOLOGY DATA:  I have reviewed the results of labs, PSA and biopsy  results related to his cancer.  RADIOGRAPHIC STUDIES: I have reviewed the radiological images related to his cancer during tumor board meeting.

## 2023-12-19 ENCOUNTER — Inpatient Hospital Stay

## 2023-12-19 ENCOUNTER — Encounter: Payer: Self-pay | Admitting: Radiation Oncology

## 2023-12-19 ENCOUNTER — Ambulatory Visit
Admission: RE | Admit: 2023-12-19 | Discharge: 2023-12-19 | Disposition: A | Discharge status: Home / Self Care | Source: Ambulatory Visit | Attending: Radiation Oncology | Admitting: Radiation Oncology

## 2023-12-19 VITALS — BP 137/77 | HR 82 | Temp 97.8°F | Resp 18 | Ht 67.0 in | Wt 217.0 lb

## 2023-12-19 DIAGNOSIS — I129 Hypertensive chronic kidney disease with stage 1 through stage 4 chronic kidney disease, or unspecified chronic kidney disease: Secondary | ICD-10-CM | POA: Insufficient documentation

## 2023-12-19 DIAGNOSIS — C61 Malignant neoplasm of prostate: Secondary | ICD-10-CM | POA: Diagnosis present

## 2023-12-19 DIAGNOSIS — N183 Chronic kidney disease, stage 3 unspecified: Secondary | ICD-10-CM | POA: Diagnosis not present

## 2023-12-19 DIAGNOSIS — C786 Secondary malignant neoplasm of retroperitoneum and peritoneum: Secondary | ICD-10-CM | POA: Insufficient documentation

## 2023-12-19 DIAGNOSIS — Z800 Family history of malignant neoplasm of digestive organs: Secondary | ICD-10-CM | POA: Diagnosis not present

## 2023-12-19 DIAGNOSIS — Z87891 Personal history of nicotine dependence: Secondary | ICD-10-CM | POA: Diagnosis not present

## 2023-12-19 DIAGNOSIS — E1122 Type 2 diabetes mellitus with diabetic chronic kidney disease: Secondary | ICD-10-CM | POA: Insufficient documentation

## 2023-12-19 DIAGNOSIS — C7951 Secondary malignant neoplasm of bone: Secondary | ICD-10-CM | POA: Insufficient documentation

## 2023-12-19 HISTORY — DX: Elevated prostate specific antigen (PSA): R97.20

## 2023-12-19 NOTE — Assessment & Plan Note (Addendum)
 ADT and ARPI with urologic oncology Follow-up with Med onc as needed. May refer to genetic counseling for testing calcium  (1000-1200 mg daily from food and supplements) and vitamin D3 (1000 IU daily) Healthy lifestyle to prevent diabetes and CV disease exercises as able Limit alcohol  consumption and avoid smoking

## 2023-12-19 NOTE — Progress Notes (Signed)
 Radiation Oncology         [phone] ________________________________  Multidisciplinary Prostate Cancer Clinic  Radiation Oncology Consultation  Name: Rodney [name] MRN: [id number]  Date: 12/19/2023  DOB: [birth year]  RR:Xlopx, Jenkins Jansky, MD  Renda Glance, MD   REFERRING PHYSICIAN: Renda Glance, MD  DIAGNOSIS: 83 y.o. 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   ICD-10-CM   1. Malignant neoplasm of prostate (HCC)  C61       HISTORY OF PRESENT ILLNESS::Rodney [name] is a 83 y.o. gentleman. In summary, he is s/p RALP in 2007 and salvage radiation therapy to the prostatic fossa in 2015. Following this, his PSA rose and fluctuated, reaching as high as 26.9 in 09/2021. He underwent staging CT and bone scans over the years that were all negative through 07/2020. However, he was found to have oligometastatic disease on restaging PSMA PET scan on 10/28/21.  He was referred back to us  at that time to discuss metastasis directed therapy and subsequently received stereotactic body radiotherapy (SBRT) to the pelvic nodal disease and the solitary bony metastasis in the L3 vertebral body. His PSA initially decreased following radiation, to 20.6 in 12/2021, but increased to 34.6 by 06/2022. His PSA has remained stable between 32 and 34 over the last year, and restaging PSMA PET scan in 09/2022 showed improvement with no evidence of disease progression.  In 05/2023, he developed groin pain and lower extremity edema, prompting ED visit and subsequent admission. He underwent a CT A/P on 05/28/23 in the ED that showed a slow increase in stranding and soft tissue thickening throughout the omentum compared to prior exam. This finding prompted another restaging PSMA PET scan on 08/22/23, which showed: mild tracer affinity within the low abdominal  retroperitoneum and along the left common iliac vessels, cannot exclude small volume residual nodal metastasis but no new sites of disease and a relatively similar omental/peritoneal soft tissue thickening with no significant tracer avidity, suspicious for metastatic disease from a second primary. Given the concern for a second primary, a CT-guided biopsy of the right omental mass was performed on 11/01/23. 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  The patient reviewed the biopsy and imaging results with his urologist and he has kindly been referred today to the multidisciplinary prostate cancer clinic for presentation of pathology and radiology studies in our conference for discussion of potential radiation treatment options and clinical evaluation.  PREVIOUS RADIATION THERAPY: Yes  11/25/2021 through 12/08/2021 Site Technique Total Dose (Gy) Dose per Fx (Gy) Completed Fx Beam Energies  Lumbar Spine: Spine_L3 IMRT 50/50 10 5/5 6XFFF  Pelvis: Pelvis_Nodes IMRT 50/50 5 10/10 6XFFF   11/19/13 - 01/10/14: Prostatic Fossa / 68.4 Gy in 38 fractions of 1.8 Gy   PAST MEDICAL HISTORY:  has a past medical history of Bladder calculus, CAD (coronary artery disease), CKD (chronic kidney disease), stage III (HCC), COLONIC POLYPS, HX OF (12/02/2008), DIABETES MELLITUS, TYPE II (07/19/2007), Dyspnea, Elevated PSA, Erectile dysfunction, GERD (gastroesophageal reflux disease), Glaucoma, GOUT (07/19/2007), History of kidney stones, HYPERLIPIDEMIA (12/02/2008), HYPERTENSION (07/19/2007), Kidney stones, Prostate cancer (HCC) (2007), Reiter syndrome, TIA (transient ischemic attack), Tubular adenoma of colon, and Vitamin D  deficiency.    PAST SURGICAL HISTORY: Past Surgical History:  Procedure Laterality Date   bladder neck dilation     CATARACT EXTRACTION  both eyes- 2015   COLONOSCOPY  11/15/2021    12-11-14-polyps   CYSTOSCOPY N/A 06/11/2018   Procedure: CYSTOSCOPY WITH REMOVAL OF BLADDER CALCULUS;  Surgeon: Renda Glance, MD;  Location: [facility];  Service: Urology;  Laterality: N/A;   ELBOW SURGERY     bone spur - right   LEFT HEART CATH AND CORONARY ANGIOGRAPHY N/A 03/19/2018   Procedure: LEFT HEART CATH AND CORONARY ANGIOGRAPHY;  Surgeon: Claudene Victory ORN, MD;  Location: [facility]AS[facility] CV [facility];  Service: Cardiovascular;  Laterality: N/A;   LOWER EXTREMITY VENOGRAPHY Left 05/29/2023   Procedure: LOWER EXTREMITY VENOGRAPHY;  Surgeon: Sheree Penne Bruckner, MD;  Location: [facility] INVASIVE CV LAB;  Service: Cardiovascular;  Laterality: Left;   ORIF ANKLE FRACTURE Left 05/17/2015   Procedure: OPEN REDUCTION INTERNAL FIXATION (ORIF) ANKLE FRACTURE;  Surgeon: Glendia Cordella Hutchinson, MD;  Location: [facility];  Service: Orthopedics;  Laterality: Left;   PERIPHERAL VASCULAR THROMBECTOMY Left 05/29/2023   Procedure: PERIPHERAL VASCULAR THROMBECTOMY;  Surgeon: Sheree Penne Bruckner, MD;  Location: [facility] INVASIVE CV LAB;  Service: Cardiovascular;  Laterality: Left;   PROSTATE BIOPSY     PROSTATECTOMY  2007   URINARY SPHINCTER IMPLANT N/A 05/21/2019   Procedure: IMPLANTATION OF ARTIFICIAL URINARY SPHINCTER CYSTOSCOPY;  Surgeon: Gaston Glendia, MD;  Location: [facility];  Service: Urology;  Laterality: N/A;   VASECTOMY      FAMILY HISTORY: family history includes Cancer in his sister.  SOCIAL HISTORY:  reports that he quit smoking about 45 years ago. His smoking use included cigarettes. He started smoking about 75 years ago. He has a 30 pack-year smoking history. He has never used smokeless tobacco. He reports that he does not currently use alcohol . He reports that he does not use drugs.  ALLERGIES: Benazepril  MEDICATIONS:  Current Outpatient Medications  Medication Sig Dispense Refill   amLODipine  (NORVASC ) 5 MG tablet Take 1 tablet (5 mg total) by mouth 2 (two) times daily. 180 tablet 1   apixaban   (ELIQUIS ) 5 MG TABS tablet Take 1 tablet by mouth twice daily 180 tablet 1   atorvastatin  (LIPITOR) 20 MG tablet Take 1 tablet (20 mg total) by mouth at bedtime. 90 tablet 3   dorzolamide -timolol  (COSOPT ) 22.3-6.8 MG/ML ophthalmic solution Place 1 drop into both eyes every morning.     empagliflozin  (JARDIANCE ) 10 MG TABS tablet Take 1 tablet (10 mg total) by mouth daily before breakfast. 90 tablet 1   glipiZIDE  (GLUCOTROL ) 5 MG tablet Take 1 tablet (5 mg total) by mouth daily before breakfast. 90 tablet 1   glucose blood (ACCU-CHEK AVIVA) test strip 1 each by Other route 2 (two) times daily. 100 each 3   latanoprost  (XALATAN ) 0.005 % ophthalmic solution 1 drop at bedtime.     losartan  (COZAAR ) 50 MG tablet Take 1 tablet (50 mg total) by mouth at bedtime.     OZEMPIC , 1 MG/DOSE, 4 MG/3ML SOPN INJECT 1MG  INTO THE SKIN ONCE A WEEK 3 mL 0   polyethylene glycol (MIRALAX  / GLYCOLAX ) 17 g packet Take 17 g by mouth daily. 14 each 0   Semaglutide ,0.25 or 0.5MG /DOS, (OZEMPIC , 0.25 OR 0.5 MG/DOSE,) 2 MG/3ML SOPN Inject 0.5 mg into the skin once a week. 3 mL 0   senna-docusate (SENOKOT-S) 8.6-50 MG tablet Take 1 tablet by mouth 2 (two) times daily. 30 tablet 1   No current facility-administered medications for this encounter.    REVIEW OF SYSTEMS:  On review of systems, the patient reports that he is doing well overall. He  denies any chest pain, shortness of breath, cough, fevers, chills, night sweats, unintended weight changes. He denies any bowel disturbances, and denies abdominal pain, nausea or vomiting. He denies any new musculoskeletal or joint aches or pains.  A complete review of systems is obtained and is otherwise negative.   PHYSICAL EXAM:  Wt Readings from Last 3 Encounters:  12/19/23 217 lb (98.4 kg)  12/13/23 218 lb 8 oz (99.1 kg)  11/01/23 220 lb (99.8 kg)   Temp Readings from Last 3 Encounters:  12/19/23 97.8 F (36.6 C)  12/13/23 97.7 F (36.5 C) (Temporal)  11/01/23 97.8 F (36.6  C) (Oral)   BP Readings from Last 3 Encounters:  12/19/23 137/77  12/13/23 122/72  11/01/23 120/65   Pulse Readings from Last 3 Encounters:  12/19/23 82  12/13/23 83  11/01/23 76    /10  In general this is a well appearing Caucasian man in no acute distress. He's alert and oriented x4 and appropriate throughout the examination. Cardiopulmonary assessment is negative for acute distress and he exhibits normal effort.    KPS = 100  100 - Normal; no complaints; no evidence of disease. 90   - Able to carry on normal activity; minor signs or symptoms of disease. 80   - Normal activity with effort; some signs or symptoms of disease. 30   - Cares for self; unable to carry on normal activity or to do active work. 60   - Requires occasional assistance, but is able to care for most of his personal needs. 50   - Requires considerable assistance and frequent medical care. 40   - Disabled; requires special care and assistance. 30   - Severely disabled; hospital admission is indicated although death not imminent. 20   - Very sick; hospital admission necessary; active supportive treatment necessary. 10   - Moribund; fatal processes progressing rapidly. 0     - Dead  Karnofsky DA, Abelmann WH, Craver LS and Burchenal [facility] [phone]) The use of the nitrogen mustards in the palliative treatment of carcinoma: with particular reference to bronchogenic carcinoma Cancer 1 634-56   LABORATORY DATA:  Lab Results  Component Value Date   WBC 5.8 11/01/2023   HGB 13.2 11/01/2023   HCT 40.1 11/01/2023   MCV 98.3 11/01/2023   PLT 237 11/01/2023   Lab Results  Component Value Date   NA 138 10/04/2023   K 4.7 10/04/2023   CL 107 10/04/2023   CO2 25 10/04/2023   Lab Results  Component Value Date   ALT 19 10/04/2023   AST 16 10/04/2023   ALKPHOS 80 10/04/2023   BILITOT 1.0 10/04/2023     RADIOGRAPHY: VAS US  IVC/ILIAC (VENOUS ONLY) Result Date: 12/13/2023 IVC/ILIAC STUDY Patient Name:  Rodney [name]  Date of Exam:   12/13/2023 Medical Rec #: [id number]           Accession #:    [id number] Date of Birth: 1940-04-23           Patient Gender: M Patient Age:   45 years Exam Location:  [address] Procedure:      VAS US  IVC/ILIAC (VENOUS ONLY) Referring Phys: PENNE COLORADO --------------------------------------------------------------------------------  Indications: 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 Risk Factors: Hypertension, hyperlipidemia, Diabetes, past history of smoking. Other Factors: TIA. Limitations: Air/bowel gas, obesity and abdominal rigidity.  Comparison Study: On 07/05/2023, an venous duplex of the IVC/Iliac vessels                   showed patency of the left common iliac vein, s/p stent                   placement and patency of the left external iliac vein. Performing Technologist: Nanetta Shad RVT  Examination Guidelines: A complete evaluation includes B-mode imaging, spectral Doppler, color Doppler, and power Doppler as needed of all accessible portions of each vessel. Bilateral testing is considered an integral part of a complete examination. Limited examinations for reoccurring indications may be performed as noted.  IVC/Iliac Findings: +--------+------+--------+--------+   IVC   PatentThrombusComments +--------+------+--------+--------+ IVC Proxpatent                 +--------+------+--------+--------+  +-------------------+---------+-----------+---------+-----------+--------+         CIV        RT-PatentRT-ThrombusLT-PatentLT-ThrombusComments +-------------------+---------+-----------+---------+-----------+--------+ Common Iliac Prox                       patent                       +-------------------+---------+-----------+---------+-----------+--------+ Common Iliac Mid                        patent                      +-------------------+---------+-----------+---------+-----------+--------+ Common Iliac Distal                     patent                      +-------------------+---------+-----------+---------+-----------+--------+  +-------------------------+---------+-----------+---------+-----------+--------+            EIV           RT-PatentRT-ThrombusLT-PatentLT-ThrombusComments +-------------------------+---------+-----------+---------+-----------+--------+ External Iliac Vein Prox                      patent                      +-------------------------+---------+-----------+---------+-----------+--------+ External Iliac Vein Mid                       patent                      +-------------------------+---------+-----------+---------+-----------+--------+ External Iliac Vein                           patent                      Distal                                                                    +-------------------------+---------+-----------+---------+-----------+--------+   Summary: IVC/Iliac: There is no  evidence of thrombus involving the IVC. There is no evidence of thrombus involving the left common iliac vein, s/p stent placement. There is no evidence of thrombus involving the left external iliac vein.  *See table(s) above for measurements and observations.  Electronically signed by Lonni Gaskins MD on 12/13/2023 at 10:55:37 AM.    Final       IMPRESSION/PLAN: 83 y.o. 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  Today, we talked to the patient about the findings and workup thus far. We discussed the natural history of metastatic prostate cancer and general  treatment, highlighting the role of radiotherapy in the management of oligoprogression.  In light of the omental involvement, radiotherapy is not recommended at this time.  Instead, consensus recommendation in our multidisciplinary conference today is to proceed with systemic therapy, possibly intraperiotoneal chemotherapy.  He will also meet with Dr. Renda and Dr. Tina to further discuss recommendations the patient was encouraged to ask questions that were answered to his stated satisfaction.  At the end of the conversation the patient is interested in moving forward with the recommended systemic therapy and will discuss this further with Dr. Tina today.  We enjoyed meeting with him again today and look forward to continuing to follow his progress.  Of course, if there is any clinical indication for further radiotherapy in the future, we are more than happy to participate in his care.  He knows that he is welcome to call at anytime with any questions or concerns and we will see him back on an as-needed basis.  We personally spent 45 minutes in this encounter including chart review, reviewing radiological studies, meeting face-to-face with the patient, entering orders and completing documentation.    Sabra MICAEL Rusk, PA-C    Donnice Barge, MD  [facility]  Radiation Oncology Direct Dial: [phone]  Fax: [phone] [email or web address]  Skype  [email or web address]   This document serves as a record of services personally performed by Donnice Barge, MD and Sabra Rusk, PA-C. It was created on their behalf by Izetta Neither, a trained medical scribe. The creation of this record is based on the scribe's personal observations and the provider's statements to them. This document has been checked and approved by the attending provider.

## 2023-12-27 ENCOUNTER — Other Ambulatory Visit: Payer: Self-pay

## 2023-12-27 ENCOUNTER — Other Ambulatory Visit: Payer: Self-pay | Admitting: Family Medicine

## 2023-12-27 DIAGNOSIS — N1831 Chronic kidney disease, stage 3a: Secondary | ICD-10-CM

## 2023-12-27 MED ORDER — OZEMPIC (1 MG/DOSE) 4 MG/3ML ~~LOC~~ SOPN: 1.0000 mg | PEN_INJECTOR | SUBCUTANEOUS | 0 refills | Status: DC

## 2023-12-29 NOTE — Progress Notes (Signed)
 Patient was prescribed Nubeqa on 12/5 with [facility].   Patient is established with a treatment plan and is actively engaged in care. Nurse Navigator services not currently indicated at this time. Will re-evaluate if needs change or if additional support is requested.

## 2024-01-06 ENCOUNTER — Other Ambulatory Visit: Payer: Self-pay | Admitting: Family Medicine

## 2024-01-06 DIAGNOSIS — I10 Essential (primary) hypertension: Secondary | ICD-10-CM

## 2024-01-11 ENCOUNTER — Encounter: Payer: Self-pay | Admitting: Family Medicine

## 2024-01-11 ENCOUNTER — Ambulatory Visit: Admitting: Family Medicine

## 2024-01-11 VITALS — BP 142/80 | HR 66 | Temp 97.0°F | Ht 67.0 in | Wt 214.0 lb

## 2024-01-11 DIAGNOSIS — I10 Essential (primary) hypertension: Secondary | ICD-10-CM | POA: Diagnosis not present

## 2024-01-11 DIAGNOSIS — N1832 Chronic kidney disease, stage 3b: Secondary | ICD-10-CM

## 2024-01-11 DIAGNOSIS — E1122 Type 2 diabetes mellitus with diabetic chronic kidney disease: Secondary | ICD-10-CM | POA: Diagnosis not present

## 2024-01-11 DIAGNOSIS — Z7985 Long-term (current) use of injectable non-insulin antidiabetic drugs: Secondary | ICD-10-CM

## 2024-01-11 DIAGNOSIS — E559 Vitamin D deficiency, unspecified: Secondary | ICD-10-CM | POA: Diagnosis not present

## 2024-01-11 DIAGNOSIS — E785 Hyperlipidemia, unspecified: Secondary | ICD-10-CM

## 2024-01-11 DIAGNOSIS — C61 Malignant neoplasm of prostate: Secondary | ICD-10-CM | POA: Diagnosis not present

## 2024-01-11 DIAGNOSIS — Z86718 Personal history of other venous thrombosis and embolism: Secondary | ICD-10-CM

## 2024-01-11 DIAGNOSIS — E038 Other specified hypothyroidism: Secondary | ICD-10-CM

## 2024-01-11 LAB — COMPREHENSIVE METABOLIC PANEL WITH GFR
ALT: 30 U/L (ref 3–53)
AST: 24 U/L (ref 5–37)
Albumin: 4.3 g/dL (ref 3.5–5.2)
Alkaline Phosphatase: 86 U/L (ref 39–117)
BUN: 21 mg/dL (ref 6–23)
CO2: 24 meq/L (ref 19–32)
Calcium: 9.2 mg/dL (ref 8.4–10.5)
Chloride: 105 meq/L (ref 96–112)
Creatinine, Ser: 1.69 mg/dL — ABNORMAL HIGH (ref 0.40–1.50)
GFR: 37.06 mL/min — ABNORMAL LOW (ref >60.00)
Glucose, Bld: 144 mg/dL — ABNORMAL HIGH (ref 70–99)
Potassium: 4.2 meq/L (ref 3.5–5.1)
Sodium: 139 meq/L (ref 135–145)
Total Bilirubin: 0.9 mg/dL (ref 0.2–1.2)
Total Protein: 6.7 g/dL (ref 6.0–8.3)

## 2024-01-11 LAB — LIPID PANEL
Cholesterol: 95 mg/dL (ref 28–200)
HDL: 29.3 mg/dL — ABNORMAL LOW (ref >39.00)
LDL Cholesterol: 33 mg/dL (ref 10–99)
NonHDL: 65.74
Total CHOL/HDL Ratio: 3
Triglycerides: 166 mg/dL — ABNORMAL HIGH (ref 10.0–149.0)
VLDL: 33.2 mg/dL (ref 0.0–40.0)

## 2024-01-11 LAB — CBC WITH DIFFERENTIAL/PLATELET
Basophils Absolute: 0 K/uL (ref 0.0–0.1)
Basophils Relative: 0.6 % (ref 0.0–3.0)
Eosinophils Absolute: 0.2 K/uL (ref 0.0–0.7)
Eosinophils Relative: 2.6 % (ref 0.0–5.0)
HCT: 39.6 % (ref 39.0–52.0)
Hemoglobin: 13.2 g/dL (ref 13.0–17.0)
Lymphocytes Relative: 12.4 % (ref 12.0–46.0)
Lymphs Abs: 0.7 K/uL (ref 0.7–4.0)
MCHC: 33.4 g/dL (ref 30.0–36.0)
MCV: 98.1 fl (ref 78.0–100.0)
Monocytes Absolute: 0.4 K/uL (ref 0.1–1.0)
Monocytes Relative: 7.7 % (ref 3.0–12.0)
Neutro Abs: 4.5 K/uL (ref 1.4–7.7)
Neutrophils Relative %: 76.7 % (ref 43.0–77.0)
Platelets: 270 K/uL (ref 150.0–400.0)
RBC: 4.04 Mil/uL — ABNORMAL LOW (ref 4.22–5.81)
RDW: 14.2 % (ref 11.5–15.5)
WBC: 5.8 K/uL (ref 4.0–10.5)

## 2024-01-11 LAB — T4, FREE: Free T4: 0.82 ng/dL (ref 0.60–1.60)

## 2024-01-11 LAB — VITAMIN D 25 HYDROXY (VIT D DEFICIENCY, FRACTURES): VITD: 17.7 ng/mL — ABNORMAL LOW (ref 30.00–100.00)

## 2024-01-11 LAB — T3, FREE: T3, Free: 4.5 pg/mL — ABNORMAL HIGH (ref 2.3–4.2)

## 2024-01-11 LAB — HEMOGLOBIN A1C: Hgb A1c MFr Bld: 6.5 % (ref 4.6–6.5)

## 2024-01-11 LAB — TSH: TSH: 2.52 u[IU]/mL (ref 0.35–5.50)

## 2024-01-11 MED ORDER — GLIPIZIDE 5 MG PO TABS: 5.0000 mg | ORAL_TABLET | Freq: Every day | ORAL | 1 refills | Status: AC

## 2024-01-11 MED ORDER — APIXABAN 5 MG PO TABS: 5.0000 mg | ORAL_TABLET | Freq: Two times a day (BID) | ORAL | 1 refills | Status: AC

## 2024-01-11 MED ORDER — LOSARTAN POTASSIUM 50 MG PO TABS: 50.0000 mg | ORAL_TABLET | Freq: Every day | ORAL | 3 refills | Status: AC

## 2024-01-11 MED ORDER — OZEMPIC (2 MG/DOSE) 8 MG/3ML ~~LOC~~ SOPN: 2.0000 mg | PEN_INJECTOR | SUBCUTANEOUS | 1 refills | Status: AC

## 2024-01-11 NOTE — Patient Instructions (Signed)
 It was very nice to see you today!  Merry Christmas  Take miralax  daily as well.    PLEASE NOTE:  If you had any lab tests please let us  know if you have not heard back within a few days. You may see your results on MyChart before we have a chance to review them but we will give you a call once they are reviewed by us . If we ordered any referrals today, please let us  know if you have not heard from their office within the next week.   Please try these tips to maintain a healthy lifestyle:  Eat most of your calories during the day when you are active. Eliminate processed foods including packaged sweets (pies, cakes, cookies), reduce intake of potatoes, white bread, white pasta, and white rice. Look for whole grain options, oat flour or almond flour.  Each meal should contain half fruits/vegetables, one quarter protein, and one quarter carbs (no bigger than a computer mouse).  Cut down on sweet beverages. This includes juice, soda, and sweet tea. Also watch fruit intake, though this is a healthier sweet option, it still contains natural sugar! Limit to 3 servings daily.  Drink at least 1 glass of water  with each meal and aim for at least 8 glasses per day  Exercise at least 150 minutes every week.

## 2024-01-11 NOTE — Progress Notes (Signed)
 Subjective:     Patient ID: Rodney [name], male    DOB: Apr 12, 1940, 83 y.o.   MRN: [id number]  No chief complaint on file.   Discussed the use of AI scribe software for clinical note transcription with the patient, who gave verbal consent to proceed.  History of Present Illness Rodney [name] is an 83 year old male with diabetes, hypertension, hyperlipidemia, h/o dvt's, and prostate cancer who presents for follow-up.  He is here for follow-up on his diabetes, hypertension, and hyperlipidemia. He takes Jardiance  10 mg, glipizide  5 mg, and Ozempic  1 mg for diabetes management. He has been on Ozempic  for about three months. He experiences swelling in the testicles, which he suspects might be related to Jardiance . No burning with urination or yeast infections. He is also taking atorvastatin  20 mg for cholesterol management.  For hypertension, he takes amlodipine  5 mg twice daily and was previously on losartan  50 mg once daily. He ran out of losartan  three weeks ago due to a prescription renewal issue. He has not been checking his blood pressure at home but reports it is usually well-controlled during doctor visits.  He is on Eliquis  for atrial fibrillation and a history of blood clots. He has a history of prostate cancer and recent findings of cancer cells in the stomach lining. He is on a medication regimen that includes a hormone-blocking treatment to manage the cancer, taking two tablets in the morning and two at night. He reports that this medication may affect his kidneys, and he is scheduled for a PET scan in six months.  He experiences constipation, which he manages with stool softeners and occasionally laxatives. He does not use Miralax  but is considering it due to constipation potentially caused by Ozempic  and other medications. No leg swelling but reports shortness of breath and occasional dizziness when standing up quickly. He has lost weight, currently weighing 214 pounds, down  from 235 pounds last year.  No history of heart failure. He has not informed his urologist about the testicular swelling but plans to do so during his next visit in February.    There are no preventive care reminders to display for this patient.  Past Medical History:  Diagnosis Date   Bladder calculus    CAD (coronary artery disease)    non obs    CKD (chronic kidney disease), stage III (HCC)     i have 35-40% of my kidney function; mgd by pcp    COLONIC POLYPS, HX OF 12/02/2008   DIABETES MELLITUS, TYPE II 07/19/2007   Dyspnea    reports sob over the last 2 years , underwent cath  in feb 2020 , reports cath was clean , referred to pulmonologist that stopped his Actos  and encouraged weight loss  ; reports today minimum improvement in breathing , appt with pulm in June 2020   Elevated PSA    Erectile dysfunction    GERD (gastroesophageal reflux disease)    Glaucoma    GOUT 07/19/2007   History of kidney stones    HYPERLIPIDEMIA 12/02/2008   HYPERTENSION 07/19/2007   Kidney stones    Prostate cancer (HCC) 2007   Reiter syndrome    incomplete   TIA (transient ischemic attack)    Tubular adenoma of colon    Vitamin D  deficiency     Past Surgical History:  Procedure Laterality Date   bladder neck dilation     CATARACT EXTRACTION     both eyes- 2015   COLONOSCOPY  11/15/2021  12-11-14-polyps   CYSTOSCOPY N/A 06/11/2018   Procedure: CYSTOSCOPY WITH REMOVAL OF BLADDER CALCULUS;  Surgeon: Renda Glance, MD;  Location: [facility];  Service: Urology;  Laterality: N/A;   ELBOW SURGERY     bone spur - right   LEFT HEART CATH AND CORONARY ANGIOGRAPHY N/A 03/19/2018   Procedure: LEFT HEART CATH AND CORONARY ANGIOGRAPHY;  Surgeon: Claudene Victory ORN, MD;  Location: [facility];  Service: Cardiovascular;  Laterality: N/A;   LOWER EXTREMITY VENOGRAPHY Left 05/29/2023   Procedure: LOWER EXTREMITY VENOGRAPHY;  Surgeon: Sheree Penne Bruckner, MD;  Location: [facility] CV [facility];   Service: Cardiovascular;  Laterality: Left;   ORIF ANKLE FRACTURE Left 05/17/2015   Procedure: OPEN REDUCTION INTERNAL FIXATION (ORIF) ANKLE FRACTURE;  Surgeon: Glendia Cordella Hutchinson, MD;  Location: [facility];  Service: Orthopedics;  Laterality: Left;   PERIPHERAL VASCULAR THROMBECTOMY Left 05/29/2023   Procedure: PERIPHERAL VASCULAR THROMBECTOMY;  Surgeon: Sheree Penne Bruckner, MD;  Location: [facility] INVASIVE CV LAB;  Service: Cardiovascular;  Laterality: Left;   PROSTATE BIOPSY     PROSTATECTOMY  2007   URINARY SPHINCTER IMPLANT N/A 05/21/2019   Procedure: IMPLANTATION OF ARTIFICIAL URINARY SPHINCTER CYSTOSCOPY;  Surgeon: Gaston Glendia, MD;  Location: [facility];  Service: Urology;  Laterality: N/A;   VASECTOMY      Current Medications[1]  Allergies[2] ROS neg/noncontributory except as noted HPI/below      Objective:     BP (!) 142/80 (BP Location: Left Arm, Patient Position: Sitting, Cuff Size: Normal)   Pulse 66   Temp (!) 97 F (36.1 C) (Temporal)   Ht 5' 7 (1.702 m)   Wt 214 lb (97.1 kg)   SpO2 98%   BMI 33.52 kg/m  Wt Readings from Last 3 Encounters:  01/11/24 214 lb (97.1 kg)  12/19/23 217 lb (98.4 kg)  12/13/23 218 lb 8 oz (99.1 kg)    Physical Exam MEASUREMENTS: Weight- 214. GENERAL: Well developed well nourished no acute distress HEAD EYES EARS NOSE THROAT: Normocephalic atraumatic, conjunctiva not injected, sclera nonicteric CARDIAC: Regular rate and rhythm, S1 S2 present, no murmur, dorsalis pedis 2 plus bilaterally NECK: Supple, no thyromegaly, no nodes, no carotid bruits LUNGS: Clear to auscultation bilaterally, no wheezes ABDOMEN: Bowel sounds present, soft, non tender, non distended, no hepatosplenomegaly, no masses EXTREMITIES: Mild Swelling in left leg MUSCULOSKELETAL: No gross abnormalities NEUROLOGICAL: Alert and oriented x3, cranial nerves II through XII intact PSYCHIATRIC: Normal mood, good eye contact       Assessment & Plan:  Type 2 diabetes  mellitus with stage 3b chronic kidney disease, without long-term current use of insulin  (HCC) -     glipiZIDE ; Take 1 tablet (5 mg total) by mouth daily before breakfast.  Dispense: 90 tablet; Refill: 1 -     Ozempic  (2 MG/DOSE); Inject 2 mg into the skin once a week.  Dispense: 9 mL; Refill: 1 -     Comprehensive metabolic panel with GFR -     Hemoglobin A1c  Essential (primary) hypertension -     Losartan  Potassium; Take 1 tablet (50 mg total) by mouth at bedtime.  Dispense: 90 tablet; Refill: 3 -     Comprehensive metabolic panel with GFR -     CBC with Differential/Platelet  Subclinical hypothyroidism -     TSH -     T3, free -     T4, free  Vitamin D  deficiency -     VITAMIN D  25 Hydroxy (Vit-D Deficiency, Fractures)  Malignant neoplasm of prostate (  HCC)  History of recurrent deep vein thrombosis (DVT) -     Apixaban ; Take 1 tablet (5 mg total) by mouth 2 (two) times daily.  Dispense: 180 tablet; Refill: 1  Dyslipidemia -     Lipid panel -     Comprehensive metabolic panel with GFR  Stage 3b chronic kidney disease (HCC) -     Comprehensive metabolic panel with GFR    Assessment and Plan Assessment & Plan Type 2 diabetes mellitus with stage 3b chronic kidney disease   His diabetes is managed with Jardiance , glipizide , and Ozempic . There are concerns about Jardiance  causing testicular swelling(suspect more from h/o dvt, malig, etc. . He has no history of heart failure. Jardiance  is used for kidney protection, but Ozempic  also offers this benefit. Jardiance  is held to see if swelling improves. Ozempic  dose will be increased after the current supply is finished. Glipizide  will be continued as prescribed.  Malignant neoplasm of prostate   He has prostate cancer with recent detection of cancer cells in the stomach lining. Hormone therapy is used to shrink the cancer, which is slow-growing. The treatment aims to slow progression. The current hormone therapy regimen will be  continued. A PET scan will be scheduled in six months to assess cancer progression. Advised to d/w urol about the testicular swelling  Essential hypertension   His hypertension is managed with amlodipine  and losartan . A recent lapse in losartan  due to a prescription renewal issue led to slightly elevated blood pressure. He does not report home blood pressure monitoring. The losartan  prescription will be renewed.  Dyslipidemia   His dyslipidemia is managed with atorvastatin , which will be continued as prescribed.  History of recurrent deep vein thrombosis   Recurrent DVT is managed with Eliquis , which will be continued as prescribed.  Constipation   Constipation is likely exacerbated by Ozempic  and other medications. He is currently using stool softeners and laxatives as needed. Daily Miralax  will be started to manage constipation, and stool softeners will be continued as needed.  Vitamin D  deficiency   His vitamin D  levels were last checked a year ago. A vitamin D  level test has been ordered.  Subclinical hypothyroidism-check levels  Stage 3b CKD-on ozempic .  monitor     Return in about 3 months (around 04/10/2024) for chronic follow-up.  Jenkins CHRISTELLA Carrel, MD     [1]  Current Outpatient Medications:    amLODipine  (NORVASC ) 5 MG tablet, Take 1 tablet by mouth twice daily, Disp: 180 tablet, Rfl: 0   atorvastatin  (LIPITOR) 20 MG tablet, Take 1 tablet (20 mg total) by mouth at bedtime., Disp: 90 tablet, Rfl: 3   darolutamide (NUBEQA) 300 MG tablet, Take 600 mg by mouth 2 (two) times daily with a meal., Disp: , Rfl:    dorzolamide -timolol  (COSOPT ) 22.3-6.8 MG/ML ophthalmic solution, Place 1 drop into both eyes every morning., Disp: , Rfl:    empagliflozin  (JARDIANCE ) 10 MG TABS tablet, Take 1 tablet (10 mg total) by mouth daily before breakfast., Disp: 90 tablet, Rfl: 1   latanoprost  (XALATAN ) 0.005 % ophthalmic solution, 1 drop at bedtime., Disp: , Rfl:    polyethylene glycol (MIRALAX  /  GLYCOLAX ) 17 g packet, Take 17 g by mouth daily., Disp: 14 each, Rfl: 0   Semaglutide , 2 MG/DOSE, (OZEMPIC , 2 MG/DOSE,) 8 MG/3ML SOPN, Inject 2 mg into the skin once a week., Disp: 9 mL, Rfl: 1   senna-docusate (SENOKOT-S) 8.6-50 MG tablet, Take 1 tablet by mouth 2 (two) times daily., Disp: 30 tablet,  Rfl: 1   apixaban  (ELIQUIS ) 5 MG TABS tablet, Take 1 tablet (5 mg total) by mouth 2 (two) times daily., Disp: 180 tablet, Rfl: 1   glipiZIDE  (GLUCOTROL ) 5 MG tablet, Take 1 tablet (5 mg total) by mouth daily before breakfast., Disp: 90 tablet, Rfl: 1   losartan  (COZAAR ) 50 MG tablet, Take 1 tablet (50 mg total) by mouth at bedtime., Disp: 90 tablet, Rfl: 3 [2]  Allergies Allergen Reactions   Benazepril Shortness Of Breath, Swelling and Other (See Comments)    Tongue became swollen- had to come to the ED

## 2024-01-12 ENCOUNTER — Other Ambulatory Visit: Payer: Self-pay

## 2024-01-12 ENCOUNTER — Ambulatory Visit: Payer: Self-pay | Admitting: Family Medicine

## 2024-01-12 DIAGNOSIS — E559 Vitamin D deficiency, unspecified: Secondary | ICD-10-CM

## 2024-01-12 MED ORDER — VITAMIN D (ERGOCALCIFEROL) 1.25 MG (50000 UNIT) PO CAPS: 50000.0000 [IU] | ORAL_CAPSULE | ORAL | 1 refills | Status: AC

## 2024-01-12 NOTE — Progress Notes (Signed)
 Called pt and notified and rx sent in

## 2024-01-12 NOTE — Progress Notes (Signed)
 Labs stable/improved except  Vitamin d  too low-send in 50,000iu/week #12/1 Will monitor thyroid  Sugars better!

## 2024-02-29 LAB — OPHTHALMOLOGY REPORT-SCANNED

## 2024-04-04 ENCOUNTER — Ambulatory Visit

## 2024-04-17 ENCOUNTER — Ambulatory Visit: Admitting: Family Medicine
